# Patient Record
Sex: Female | Born: 1935 | Race: Black or African American | Hispanic: No | Marital: Married | Smoking: Never smoker
Health system: Southern US, Community
[De-identification: ages and names within clinical notes are randomized; demographics above are authoritative.]

## PROBLEM LIST (undated history)

## (undated) DIAGNOSIS — I10 Essential (primary) hypertension: Secondary | ICD-10-CM

## (undated) DIAGNOSIS — E785 Hyperlipidemia, unspecified: Secondary | ICD-10-CM

## (undated) DIAGNOSIS — F419 Anxiety disorder, unspecified: Secondary | ICD-10-CM

## (undated) DIAGNOSIS — A0472 Enterocolitis due to Clostridium difficile, not specified as recurrent: Principal | ICD-10-CM

## (undated) DIAGNOSIS — T827XXA Infection and inflammatory reaction due to other cardiac and vascular devices, implants and grafts, initial encounter: Secondary | ICD-10-CM

## (undated) DIAGNOSIS — N390 Urinary tract infection, site not specified: Secondary | ICD-10-CM

## (undated) DIAGNOSIS — I739 Peripheral vascular disease, unspecified: Secondary | ICD-10-CM

## (undated) DIAGNOSIS — K219 Gastro-esophageal reflux disease without esophagitis: Secondary | ICD-10-CM

## (undated) DIAGNOSIS — M1990 Unspecified osteoarthritis, unspecified site: Secondary | ICD-10-CM

## (undated) DIAGNOSIS — R05 Cough: Secondary | ICD-10-CM

## (undated) DIAGNOSIS — J40 Bronchitis, not specified as acute or chronic: Secondary | ICD-10-CM

## (undated) DIAGNOSIS — F0390 Unspecified dementia without behavioral disturbance: Secondary | ICD-10-CM

## (undated) DIAGNOSIS — F329 Major depressive disorder, single episode, unspecified: Secondary | ICD-10-CM

## (undated) DIAGNOSIS — F32A Depression, unspecified: Secondary | ICD-10-CM

## (undated) DIAGNOSIS — D696 Thrombocytopenia, unspecified: Secondary | ICD-10-CM

## (undated) DIAGNOSIS — R059 Cough, unspecified: Secondary | ICD-10-CM

## (undated) DIAGNOSIS — N186 End stage renal disease: Secondary | ICD-10-CM

## (undated) DIAGNOSIS — A4101 Sepsis due to Methicillin susceptible Staphylococcus aureus: Secondary | ICD-10-CM

## (undated) DIAGNOSIS — S5012XA Contusion of left forearm, initial encounter: Secondary | ICD-10-CM

## (undated) DIAGNOSIS — Z992 Dependence on renal dialysis: Secondary | ICD-10-CM

## (undated) HISTORY — DX: Major depressive disorder, single episode, unspecified: F32.9

## (undated) HISTORY — DX: Unspecified osteoarthritis, unspecified site: M19.90

## (undated) HISTORY — DX: Sepsis due to methicillin susceptible Staphylococcus aureus: A41.01

## (undated) HISTORY — DX: Cough: R05

## (undated) HISTORY — DX: Thrombocytopenia, unspecified: D69.6

## (undated) HISTORY — DX: Gastro-esophageal reflux disease without esophagitis: K21.9

## (undated) HISTORY — PX: ABDOMINAL HYSTERECTOMY: SHX81

## (undated) HISTORY — DX: Cough, unspecified: R05.9

## (undated) HISTORY — PX: EYE SURGERY: SHX253

## (undated) HISTORY — DX: Enterocolitis due to Clostridium difficile, not specified as recurrent: A04.72

## (undated) HISTORY — DX: Depression, unspecified: F32.A

## (undated) HISTORY — DX: Contusion of left forearm, initial encounter: S50.12XA

## (undated) HISTORY — DX: Bronchitis, not specified as acute or chronic: J40

## (undated) HISTORY — DX: Infection and inflammatory reaction due to other cardiac and vascular devices, implants and grafts, initial encounter: T82.7XXA

## (undated) HISTORY — DX: Essential (primary) hypertension: I10

## (undated) HISTORY — PX: THROMBECTOMY AND REVISION OF ARTERIOVENTOUS (AV) GORETEX  GRAFT: SHX6120

---

## 1998-05-15 ENCOUNTER — Encounter: Payer: Self-pay | Admitting: Emergency Medicine

## 1998-05-15 ENCOUNTER — Emergency Department (HOSPITAL_COMMUNITY): Admission: EM | Admit: 1998-05-15 | Discharge: 1998-05-15 | Payer: Self-pay | Admitting: Emergency Medicine

## 2001-03-01 HISTORY — PX: VITRECTOMY: SHX106

## 2001-03-07 ENCOUNTER — Encounter: Payer: Self-pay | Admitting: Ophthalmology

## 2001-03-07 ENCOUNTER — Ambulatory Visit (HOSPITAL_COMMUNITY): Admission: RE | Admit: 2001-03-07 | Discharge: 2001-03-08 | Payer: Self-pay | Admitting: Ophthalmology

## 2001-03-21 ENCOUNTER — Ambulatory Visit (HOSPITAL_COMMUNITY): Admission: RE | Admit: 2001-03-21 | Discharge: 2001-03-21 | Payer: Self-pay | Admitting: Internal Medicine

## 2001-03-21 ENCOUNTER — Encounter: Payer: Self-pay | Admitting: Internal Medicine

## 2003-07-26 ENCOUNTER — Inpatient Hospital Stay (HOSPITAL_COMMUNITY): Admission: EM | Admit: 2003-07-26 | Discharge: 2003-08-06 | Payer: Self-pay | Admitting: Emergency Medicine

## 2003-07-31 ENCOUNTER — Encounter (INDEPENDENT_AMBULATORY_CARE_PROVIDER_SITE_OTHER): Payer: Self-pay | Admitting: Specialist

## 2003-08-01 ENCOUNTER — Encounter (INDEPENDENT_AMBULATORY_CARE_PROVIDER_SITE_OTHER): Payer: Self-pay | Admitting: Specialist

## 2003-08-01 ENCOUNTER — Encounter: Payer: Self-pay | Admitting: Gastroenterology

## 2003-08-10 ENCOUNTER — Ambulatory Visit (HOSPITAL_COMMUNITY): Admission: RE | Admit: 2003-08-10 | Discharge: 2003-08-10 | Payer: Self-pay | Admitting: Gastroenterology

## 2004-01-31 ENCOUNTER — Encounter: Admission: RE | Admit: 2004-01-31 | Discharge: 2004-01-31 | Payer: Self-pay | Admitting: Internal Medicine

## 2004-04-03 ENCOUNTER — Ambulatory Visit: Payer: Self-pay | Admitting: Internal Medicine

## 2004-06-16 ENCOUNTER — Ambulatory Visit: Payer: Self-pay | Admitting: Internal Medicine

## 2004-06-24 ENCOUNTER — Ambulatory Visit: Payer: Self-pay | Admitting: Internal Medicine

## 2004-07-01 ENCOUNTER — Ambulatory Visit: Payer: Self-pay | Admitting: Internal Medicine

## 2004-07-25 ENCOUNTER — Ambulatory Visit: Payer: Self-pay | Admitting: Internal Medicine

## 2004-07-30 HISTORY — PX: ARTERIOVENOUS GRAFT PLACEMENT: SUR1029

## 2004-08-05 ENCOUNTER — Ambulatory Visit: Payer: Self-pay | Admitting: Internal Medicine

## 2004-08-18 ENCOUNTER — Encounter (HOSPITAL_COMMUNITY): Admission: RE | Admit: 2004-08-18 | Discharge: 2004-08-20 | Payer: Self-pay | Admitting: Nephrology

## 2004-08-22 ENCOUNTER — Inpatient Hospital Stay (HOSPITAL_COMMUNITY): Admission: AD | Admit: 2004-08-22 | Discharge: 2004-08-24 | Payer: Self-pay | Admitting: Vascular Surgery

## 2004-10-03 ENCOUNTER — Encounter: Admission: RE | Admit: 2004-10-03 | Discharge: 2004-10-13 | Payer: Self-pay | Admitting: Nephrology

## 2005-01-06 ENCOUNTER — Ambulatory Visit (HOSPITAL_COMMUNITY): Admission: RE | Admit: 2005-01-06 | Discharge: 2005-01-06 | Payer: Self-pay | Admitting: Vascular Surgery

## 2005-07-06 ENCOUNTER — Encounter: Admission: RE | Admit: 2005-07-06 | Discharge: 2005-07-06 | Payer: Self-pay | Admitting: Nephrology

## 2005-07-10 ENCOUNTER — Emergency Department (HOSPITAL_COMMUNITY): Admission: EM | Admit: 2005-07-10 | Discharge: 2005-07-10 | Payer: Self-pay | Admitting: Emergency Medicine

## 2006-02-06 ENCOUNTER — Inpatient Hospital Stay (HOSPITAL_COMMUNITY): Admission: EM | Admit: 2006-02-06 | Discharge: 2006-02-10 | Payer: Self-pay | Admitting: Emergency Medicine

## 2006-02-13 ENCOUNTER — Ambulatory Visit (HOSPITAL_COMMUNITY): Admission: RE | Admit: 2006-02-13 | Discharge: 2006-02-13 | Payer: Self-pay | Admitting: Vascular Surgery

## 2006-03-01 HISTORY — PX: ARTERIOVENOUS GRAFT PLACEMENT: SUR1029

## 2006-03-02 ENCOUNTER — Ambulatory Visit (HOSPITAL_COMMUNITY): Admission: RE | Admit: 2006-03-02 | Discharge: 2006-03-02 | Payer: Self-pay | Admitting: Vascular Surgery

## 2006-03-12 ENCOUNTER — Ambulatory Visit (HOSPITAL_COMMUNITY): Admission: RE | Admit: 2006-03-12 | Discharge: 2006-03-12 | Payer: Self-pay | Admitting: Vascular Surgery

## 2006-04-16 ENCOUNTER — Ambulatory Visit (HOSPITAL_COMMUNITY): Admission: RE | Admit: 2006-04-16 | Discharge: 2006-04-16 | Payer: Self-pay | Admitting: Vascular Surgery

## 2006-04-20 ENCOUNTER — Ambulatory Visit: Payer: Self-pay | Admitting: Internal Medicine

## 2006-04-21 LAB — CONVERTED CEMR LAB
ALT: 14 units/L (ref 0–40)
AST: 18 units/L (ref 0–37)
Albumin: 3.6 g/dL (ref 3.5–5.2)
BUN: 18 mg/dL (ref 6–23)
Creatinine, Ser: 4.4 mg/dL — ABNORMAL HIGH (ref 0.4–1.2)
GFR calc non Af Amer: 10 mL/min
Glomerular Filtration Rate, Af Am: 13 mL/min/{1.73_m2}
Potassium: 3.9 meq/L (ref 3.5–5.1)
Sodium: 140 meq/L (ref 135–145)
Total Bilirubin: 0.8 mg/dL (ref 0.3–1.2)

## 2006-05-17 ENCOUNTER — Ambulatory Visit (HOSPITAL_COMMUNITY): Admission: RE | Admit: 2006-05-17 | Discharge: 2006-05-17 | Payer: Self-pay | Admitting: Nephrology

## 2006-05-28 ENCOUNTER — Ambulatory Visit: Payer: Self-pay | Admitting: Internal Medicine

## 2006-05-28 LAB — CONVERTED CEMR LAB
GFR calc non Af Amer: 7 mL/min
Glomerular Filtration Rate, Af Am: 8 mL/min/{1.73_m2}
Glucose, Bld: 246 mg/dL — ABNORMAL HIGH (ref 70–99)
Hgb A1c MFr Bld: 6.7 % — ABNORMAL HIGH (ref 4.6–6.0)
Potassium: 4 meq/L (ref 3.5–5.1)
Sodium: 134 meq/L — ABNORMAL LOW (ref 135–145)

## 2006-07-26 ENCOUNTER — Ambulatory Visit: Payer: Self-pay | Admitting: Internal Medicine

## 2006-10-08 ENCOUNTER — Ambulatory Visit: Payer: Self-pay | Admitting: Internal Medicine

## 2006-10-14 ENCOUNTER — Encounter: Admission: RE | Admit: 2006-10-14 | Discharge: 2006-10-14 | Payer: Self-pay | Admitting: Nephrology

## 2006-12-08 ENCOUNTER — Ambulatory Visit: Payer: Self-pay | Admitting: Internal Medicine

## 2007-01-12 DIAGNOSIS — I10 Essential (primary) hypertension: Secondary | ICD-10-CM

## 2007-01-12 DIAGNOSIS — K219 Gastro-esophageal reflux disease without esophagitis: Secondary | ICD-10-CM

## 2007-01-12 DIAGNOSIS — E1121 Type 2 diabetes mellitus with diabetic nephropathy: Secondary | ICD-10-CM

## 2007-02-25 ENCOUNTER — Ambulatory Visit: Payer: Self-pay | Admitting: Internal Medicine

## 2007-02-25 DIAGNOSIS — M1990 Unspecified osteoarthritis, unspecified site: Secondary | ICD-10-CM | POA: Insufficient documentation

## 2007-06-08 ENCOUNTER — Ambulatory Visit: Payer: Self-pay | Admitting: Internal Medicine

## 2007-06-08 DIAGNOSIS — N186 End stage renal disease: Secondary | ICD-10-CM

## 2007-06-08 DIAGNOSIS — Z992 Dependence on renal dialysis: Secondary | ICD-10-CM | POA: Insufficient documentation

## 2007-06-08 LAB — CONVERTED CEMR LAB
CO2: 31 meq/L (ref 19–32)
Chloride: 99 meq/L (ref 96–112)
Creatinine, Ser: 6.4 mg/dL (ref 0.4–1.2)
Glucose, Bld: 130 mg/dL — ABNORMAL HIGH (ref 70–99)
Hgb A1c MFr Bld: 6.1 % — ABNORMAL HIGH (ref 4.6–6.0)
Potassium: 4.9 meq/L (ref 3.5–5.1)
Sodium: 142 meq/L (ref 135–145)

## 2007-06-29 ENCOUNTER — Encounter: Admission: RE | Admit: 2007-06-29 | Discharge: 2007-08-29 | Payer: Self-pay | Admitting: Orthopaedic Surgery

## 2007-09-07 ENCOUNTER — Ambulatory Visit: Payer: Self-pay | Admitting: Internal Medicine

## 2007-09-23 ENCOUNTER — Encounter: Payer: Self-pay | Admitting: Internal Medicine

## 2007-12-19 ENCOUNTER — Ambulatory Visit: Payer: Self-pay | Admitting: Internal Medicine

## 2007-12-19 LAB — CONVERTED CEMR LAB
BUN: 61 mg/dL — ABNORMAL HIGH (ref 6–23)
CO2: 26 meq/L (ref 19–32)
Chloride: 98 meq/L (ref 96–112)
GFR calc non Af Amer: 5 mL/min
Potassium: 4.4 meq/L (ref 3.5–5.1)

## 2008-03-14 ENCOUNTER — Ambulatory Visit: Payer: Self-pay | Admitting: Internal Medicine

## 2008-06-08 ENCOUNTER — Ambulatory Visit: Payer: Self-pay | Admitting: Internal Medicine

## 2008-07-11 ENCOUNTER — Encounter: Payer: Self-pay | Admitting: Internal Medicine

## 2008-07-12 ENCOUNTER — Encounter: Payer: Self-pay | Admitting: Gastroenterology

## 2008-07-17 ENCOUNTER — Encounter (INDEPENDENT_AMBULATORY_CARE_PROVIDER_SITE_OTHER): Payer: Self-pay | Admitting: *Deleted

## 2008-09-07 ENCOUNTER — Ambulatory Visit: Payer: Self-pay | Admitting: Internal Medicine

## 2009-01-11 ENCOUNTER — Ambulatory Visit: Payer: Self-pay | Admitting: Internal Medicine

## 2009-01-11 DIAGNOSIS — E785 Hyperlipidemia, unspecified: Secondary | ICD-10-CM

## 2009-01-11 LAB — CONVERTED CEMR LAB
Basophils Relative: 0.1 % (ref 0.0–3.0)
CO2: 26 meq/L (ref 19–32)
Calcium: 10 mg/dL (ref 8.4–10.5)
Cholesterol: 165 mg/dL (ref 0–200)
GFR calc non Af Amer: 8.39 mL/min (ref >60)
HCT: 38.5 % (ref 36.0–46.0)
Hemoglobin: 12.8 g/dL (ref 12.0–15.0)
Hgb A1c MFr Bld: 6 % (ref 4.6–6.5)
Lymphocytes Relative: 32.7 % (ref 12.0–46.0)
MCHC: 33.3 g/dL (ref 30.0–36.0)
Monocytes Relative: 8.2 % (ref 3.0–12.0)
Neutro Abs: 2.9 10*3/uL (ref 1.4–7.7)
Potassium: 4.5 meq/L (ref 3.5–5.1)
RBC: 3.98 M/uL (ref 3.87–5.11)
Sodium: 140 meq/L (ref 135–145)
TSH: 2.13 microintl units/mL (ref 0.35–5.50)

## 2009-02-07 LAB — CONVERTED CEMR LAB
BUN: 47 mg/dL
Chloride: 104 meq/L
Creatinine, Ser: 8.5 mg/dL
Glucose: 111 mg/dL
HCT: 39.1 %
Hemoglobin: 12.4 g/dL
Potassium: 4 meq/L
Sodium: 139 meq/L

## 2009-02-24 ENCOUNTER — Encounter: Payer: Self-pay | Admitting: Internal Medicine

## 2009-03-08 ENCOUNTER — Ambulatory Visit: Payer: Self-pay | Admitting: Internal Medicine

## 2009-03-08 DIAGNOSIS — F0390 Unspecified dementia without behavioral disturbance: Secondary | ICD-10-CM | POA: Insufficient documentation

## 2009-03-12 ENCOUNTER — Ambulatory Visit (HOSPITAL_COMMUNITY): Admission: RE | Admit: 2009-03-12 | Discharge: 2009-03-12 | Payer: Self-pay | Admitting: Surgery

## 2009-03-12 ENCOUNTER — Ambulatory Visit: Payer: Self-pay | Admitting: Vascular Surgery

## 2009-03-18 ENCOUNTER — Ambulatory Visit (HOSPITAL_COMMUNITY): Admission: RE | Admit: 2009-03-18 | Discharge: 2009-03-18 | Payer: Self-pay | Admitting: Vascular Surgery

## 2009-04-08 ENCOUNTER — Encounter: Admission: RE | Admit: 2009-04-08 | Discharge: 2009-04-08 | Payer: Self-pay | Admitting: Surgery

## 2009-04-08 ENCOUNTER — Ambulatory Visit: Payer: Self-pay | Admitting: Vascular Surgery

## 2009-04-23 ENCOUNTER — Telehealth: Payer: Self-pay | Admitting: Internal Medicine

## 2009-05-07 ENCOUNTER — Ambulatory Visit: Payer: Self-pay | Admitting: Surgery

## 2009-05-07 ENCOUNTER — Ambulatory Visit (HOSPITAL_COMMUNITY): Admission: RE | Admit: 2009-05-07 | Discharge: 2009-05-07 | Payer: Self-pay | Admitting: Vascular Surgery

## 2009-05-09 ENCOUNTER — Ambulatory Visit (HOSPITAL_COMMUNITY): Admission: RE | Admit: 2009-05-09 | Discharge: 2009-05-09 | Payer: Self-pay | Admitting: Vascular Surgery

## 2009-06-10 ENCOUNTER — Ambulatory Visit: Payer: Self-pay | Admitting: Internal Medicine

## 2009-06-28 ENCOUNTER — Encounter: Payer: Self-pay | Admitting: Internal Medicine

## 2009-07-02 ENCOUNTER — Encounter: Payer: Self-pay | Admitting: Internal Medicine

## 2009-07-04 ENCOUNTER — Ambulatory Visit: Payer: Self-pay | Admitting: Vascular Surgery

## 2009-07-26 ENCOUNTER — Ambulatory Visit: Payer: Self-pay | Admitting: Vascular Surgery

## 2009-08-05 ENCOUNTER — Ambulatory Visit: Payer: Self-pay | Admitting: Vascular Surgery

## 2009-08-14 ENCOUNTER — Encounter: Payer: Self-pay | Admitting: Internal Medicine

## 2009-08-28 ENCOUNTER — Encounter: Payer: Self-pay | Admitting: Internal Medicine

## 2009-09-09 ENCOUNTER — Ambulatory Visit: Payer: Self-pay | Admitting: Internal Medicine

## 2009-09-09 LAB — CONVERTED CEMR LAB
HDL goal, serum: 40 mg/dL
LDL Goal: 100 mg/dL

## 2009-09-18 ENCOUNTER — Ambulatory Visit: Payer: Self-pay | Admitting: Vascular Surgery

## 2009-09-18 ENCOUNTER — Ambulatory Visit (HOSPITAL_COMMUNITY): Admission: RE | Admit: 2009-09-18 | Discharge: 2009-09-18 | Payer: Self-pay | Admitting: Vascular Surgery

## 2009-09-27 ENCOUNTER — Ambulatory Visit: Payer: Self-pay | Admitting: Vascular Surgery

## 2009-10-02 ENCOUNTER — Telehealth: Payer: Self-pay | Admitting: Internal Medicine

## 2009-10-03 ENCOUNTER — Ambulatory Visit: Payer: Self-pay | Admitting: Internal Medicine

## 2009-10-04 ENCOUNTER — Ambulatory Visit: Payer: Self-pay | Admitting: Vascular Surgery

## 2009-10-05 ENCOUNTER — Inpatient Hospital Stay (HOSPITAL_COMMUNITY): Admission: EM | Admit: 2009-10-05 | Discharge: 2009-10-07 | Payer: Self-pay | Admitting: Emergency Medicine

## 2009-10-07 ENCOUNTER — Telehealth: Payer: Self-pay | Admitting: Internal Medicine

## 2009-10-08 ENCOUNTER — Encounter: Payer: Self-pay | Admitting: Internal Medicine

## 2009-10-15 ENCOUNTER — Encounter: Payer: Self-pay | Admitting: Internal Medicine

## 2009-10-18 ENCOUNTER — Encounter: Payer: Self-pay | Admitting: Internal Medicine

## 2009-10-23 ENCOUNTER — Telehealth (INDEPENDENT_AMBULATORY_CARE_PROVIDER_SITE_OTHER): Payer: Self-pay

## 2009-11-13 ENCOUNTER — Ambulatory Visit: Payer: Self-pay | Admitting: Internal Medicine

## 2009-11-13 DIAGNOSIS — I951 Orthostatic hypotension: Secondary | ICD-10-CM | POA: Insufficient documentation

## 2009-11-13 LAB — CONVERTED CEMR LAB
Basophils Relative: 0.7 % (ref 0.0–3.0)
Chloride: 100 meq/L (ref 96–112)
Eosinophils Relative: 3.1 % (ref 0.0–5.0)
Folate: >20 ng/mL
HCT: 37.3 % (ref 36.0–46.0)
Lymphs Abs: 1.4 10*3/uL (ref 0.7–4.0)
MCV: 91.1 fL (ref 78.0–100.0)
Monocytes Absolute: 0.5 10*3/uL (ref 0.1–1.0)
Neutrophils Relative %: 58.7 % (ref 43.0–77.0)
Potassium: 3.9 meq/L (ref 3.5–5.1)
RBC: 4.09 M/uL (ref 3.87–5.11)
Sodium: 139 meq/L (ref 135–145)
TSH: 1.43 microintl units/mL (ref 0.35–5.50)
Transferrin: 161.9 mg/dL — ABNORMAL LOW (ref 212.0–360.0)
WBC: 5.1 10*3/uL (ref 4.5–10.5)

## 2009-11-15 ENCOUNTER — Encounter: Payer: Self-pay | Admitting: Internal Medicine

## 2010-01-06 ENCOUNTER — Telehealth: Payer: Self-pay | Admitting: Internal Medicine

## 2010-01-15 ENCOUNTER — Ambulatory Visit: Payer: Self-pay | Admitting: Internal Medicine

## 2010-04-07 ENCOUNTER — Ambulatory Visit: Payer: Self-pay | Admitting: Vascular Surgery

## 2010-04-16 ENCOUNTER — Ambulatory Visit: Payer: Self-pay | Admitting: Internal Medicine

## 2010-04-16 DIAGNOSIS — H547 Unspecified visual loss: Secondary | ICD-10-CM

## 2010-04-16 LAB — CONVERTED CEMR LAB
Calcium: 9.5 mg/dL (ref 8.4–10.5)
Chloride: 97 meq/L (ref 96–112)
Creatinine, Ser: 6.8 mg/dL (ref 0.4–1.2)
GFR calc non Af Amer: 7.46 mL/min (ref >60)
Hgb A1c MFr Bld: 6.2 % (ref 4.6–6.5)

## 2010-04-17 ENCOUNTER — Telehealth: Payer: Self-pay | Admitting: Internal Medicine

## 2010-06-01 ENCOUNTER — Emergency Department (HOSPITAL_COMMUNITY)
Admission: EM | Admit: 2010-06-01 | Discharge: 2010-06-01 | Payer: Self-pay | Source: Home / Self Care | Admitting: Family Medicine

## 2010-06-08 ENCOUNTER — Emergency Department (HOSPITAL_COMMUNITY)
Admission: EM | Admit: 2010-06-08 | Discharge: 2010-06-08 | Payer: Self-pay | Source: Home / Self Care | Admitting: Family Medicine

## 2010-06-22 ENCOUNTER — Encounter: Payer: Self-pay | Admitting: Surgery

## 2010-06-24 ENCOUNTER — Telehealth: Payer: Self-pay | Admitting: Internal Medicine

## 2010-07-01 ENCOUNTER — Ambulatory Visit
Admission: RE | Admit: 2010-07-01 | Discharge: 2010-07-01 | Payer: Self-pay | Source: Home / Self Care | Attending: Internal Medicine | Admitting: Internal Medicine

## 2010-07-01 DIAGNOSIS — D500 Iron deficiency anemia secondary to blood loss (chronic): Secondary | ICD-10-CM | POA: Insufficient documentation

## 2010-07-01 DIAGNOSIS — D509 Iron deficiency anemia, unspecified: Secondary | ICD-10-CM | POA: Insufficient documentation

## 2010-07-03 NOTE — Assessment & Plan Note (Signed)
Summary: 3 MNTH ROV//SLM   Vital Signs:  Patient profile:   77 year old female Height:      64 inches Weight:      166 pounds BMI:     28.60 Temp:     98.1 degrees F oral Pulse rate:   72 / minute Resp:     14 per minute BP sitting:   134 / 72  (left arm)  Vitals Entered By: Willy Eddy, LPN (June 10, 2009 12:32 PM) CC: roa, Hypertension Management   CC:  roa and Hypertension Management.  History of Present Illness: Has been experiencing increased heartburn she is doing dialysis through a [facility] they have not been able to use another site her sugars are good and her blood ahs been good per the reports from dialysis the CBG are in the 90-110 range her neuropathy is stable  Hypertension History:      She denies headache, chest pain, palpitations, dyspnea with exertion, orthopnea, PND, peripheral edema, visual symptoms, neurologic problems, syncope, and side effects from treatment.        Positive major cardiovascular risk factors include female age 69 years old or older, diabetes, hyperlipidemia, and hypertension.  Negative major cardiovascular risk factors include non-tobacco-user status.        Positive history for target organ damage include renal insufficiency.     Preventive Screening-Counseling & Management  Alcohol-Tobacco     Smoking Status: never  Problems Prior to Update: 1)  Memory Loss  (ICD-780.93) 2)  Hyperlipidemia, Mild  (ICD-272.4) 3)  End Stage Renal Disease  (ICD-585.6) 4)  Osteoarthritis  (ICD-715.90) 5)  Hypertension  (ICD-401.9) 6)  Gerd  (ICD-530.81) 7)  Diabetes Mellitus, Type II  (ICD-250.00)  Medications Prior to Update: 1)  Dialyvite 3000 3 Mg  Tabs (B Complex-C-Biotin-E-Min-Fa) .... Once Daily 2)  Renagel 800 Mg  Tabs (Sevelamer Hcl) .... Once Daily 3)  Reglan 5 Mg  Tabs (Metoclopramide Hcl) .... One By Mouth Q Ac 4)  Actos 45 Mg  Tabs (Pioglitazone Hcl) .... Once Daily 5)  Simvastatin 80 Mg  Tabs (Simvastatin) .... Once  Daily 6)  Protonix 40 Mg  Pack (Pantoprazole Sodium) .... Once Daily 7)  Lantus 100 Unit/ml  Soln (Insulin Glargine) .... 9 Units At Bedtime 8)  Novolog 100 Unit/ml  Soln (Insulin Aspart) .... As Needed If Bs Is Greater Than 1450 9)  Cosopt 2-0.5 %  Soln (Dorzolamide-Timolol) .... Once Daily 10)  Qualaquin 324 Mg  Caps (Quinine Sulfate) .... As Needed Leg Cramps 11)  Tylenol Extra Strength 1000 Mg/52ml  Liqd (Acetaminophen) .... As Needed 12)  Ultram 50 Mg  Tabs (Tramadol Hcl) .... One By Mouth Three Times A Day As Needed For Pain 13)  Xyzal 5 Mg  Tabs (Levocetirizine Dihydrochloride) .... 1/2 Once Daily 14)  Lexapro 10 Mg Tabs (Escitalopram Oxalate) .... 1/2 By Mouth Daily  Current Medications (verified): 1)  Dialyvite 3000 3 Mg  Tabs (B Complex-C-Biotin-E-Min-Fa) .... Once Daily 2)  Renagel 800 Mg  Tabs (Sevelamer Hcl) .... Once Daily 3)  Reglan 5 Mg  Tabs (Metoclopramide Hcl) .... One By Mouth Q Ac 4)  Actos 45 Mg  Tabs (Pioglitazone Hcl) .... Once Daily 5)  Simvastatin 80 Mg  Tabs (Simvastatin) .... Once Daily 6)  Dexilant 60 Mg Cpdr (Dexlansoprazole) .... One By Mouth Dialy 7)  Lantus 100 Unit/ml  Soln (Insulin Glargine) .... 9 Units At Bedtime 8)  Novolog 100 Unit/ml  Soln (Insulin Aspart) .... As Needed If Bs  Is Greater Than 1450 9)  Cosopt 2-0.5 %  Soln (Dorzolamide-Timolol) .... Once Daily 10)  Qualaquin 324 Mg  Caps (Quinine Sulfate) .... As Needed Leg Cramps 11)  Tylenol Extra Strength 1000 Mg/22ml  Liqd (Acetaminophen) .... As Needed 12)  Ultram 50 Mg  Tabs (Tramadol Hcl) .... One By Mouth Three Times A Day As Needed For Pain 13)  Xyzal 5 Mg  Tabs (Levocetirizine Dihydrochloride) .... 1/2 Once Daily 14)  Lexapro 10 Mg Tabs (Escitalopram Oxalate) .... 1/2 By Mouth Daily  Allergies (verified): No Known Drug Allergies  Past History:  Social History: Last updated: 01/12/2007 Retired Married Never Smoked Alcohol use-no Drug use-no  Risk Factors: Exercise: yes  (02/25/2007)  Risk Factors: Smoking Status: never (06/10/2009)  Past medical, surgical, family and social histories (including risk factors) reviewed, and no changes noted (except as noted below).  Past Medical History: Reviewed history from 03/08/2009 and no changes required. Cough Diabetes mellitus, type II GERD Hypertension Bronchitis Osteoarthritis end stage renal dz  Past Surgical History: Reviewed history from 02/25/2007 and no changes required. Cataract extraction Colonoscopy dialysis access  Family History: Reviewed history and no changes required.  Social History: Reviewed history from 01/12/2007 and no changes required. Retired Married Never Smoked Alcohol use-no Drug use-no  Review of Systems  The patient denies anorexia, fever, weight loss, weight gain, vision loss, decreased hearing, hoarseness, chest pain, syncope, dyspnea on exertion, peripheral edema, prolonged cough, headaches, hemoptysis, abdominal pain, melena, hematochezia, severe indigestion/heartburn, hematuria, incontinence, genital sores, muscle weakness, suspicious skin lesions, transient blindness, difficulty walking, depression, unusual weight change, abnormal bleeding, enlarged lymph nodes, angioedema, and breast masses.    Physical Exam  General:  Well-developed,well-nourished,in no acute distress; alert,appropriate and cooperative throughout examination Head:  Normocephalic and atraumatic without obvious abnormalities. No apparent alopecia or balding. Eyes:  pupils equal and pupils round.   Ears:  R ear normal and L ear normal.   Nose:  no external deformity and no nasal discharge.   Neck:  No deformities, masses, or tenderness noted. Lungs:  normal respiratory effort and no intercostal retractions.   Heart:  normal rate and regular rhythm.   Abdomen:  soft, non-tender, and normal bowel sounds.   Msk:  normal ROM and no joint tenderness.   Extremities:  trace left pedal edema and trace  right pedal edema.   Neurologic:  alert & oriented X3 and finger-to-nose normal.     Impression & Recommendations:  Problem # 1:  HYPERLIPIDEMIA, MILD (ICD-272.4)  Her updated medication list for this problem includes:    Simvastatin 80 Mg Tabs (Simvastatin) ..... Once daily  Labs Reviewed: SGOT: 18 (04/21/2006)   SGPT: 14 (04/21/2006)  Prior 10 Yr Risk Heart Disease: Not enough information (09/07/2008)   HDL:45.30 (01/11/2009)  Chol:165 (01/11/2009)  Problem # 2:  HYPERTENSION (ICD-401.9)  BP today: 134/72 Prior BP: 134/66 (03/08/2009)  Prior 10 Yr Risk Heart Disease: Not enough information (09/07/2008)  Labs Reviewed: K+: 4.0 (02/07/2009) Creat: : 8.5 (02/07/2009)   Chol: 165 (01/11/2009)   HDL: 45.30 (01/11/2009)     Problem # 3:  GERD (ICD-530.81)  Her updated medication list for this problem includes:    Dexilant 60 Mg Cpdr (Dexlansoprazole) ..... One by mouth dialy  Labs Reviewed: Hgb: 12.4 (02/07/2009)   Hct: 39.1 (02/07/2009)  Complete Medication List: 1)  Dialyvite 3000 3 Mg Tabs (B complex-c-biotin-e-min-fa) .... Once daily 2)  Renagel 800 Mg Tabs (Sevelamer hcl) .... Once daily 3)  Reglan 5 Mg Tabs (Metoclopramide hcl) .Marland KitchenMarland KitchenMarland Kitchen  One by mouth q ac 4)  Actos 45 Mg Tabs (Pioglitazone hcl) .... Once daily 5)  Simvastatin 80 Mg Tabs (Simvastatin) .... Once daily 6)  Dexilant 60 Mg Cpdr (Dexlansoprazole) .... One by mouth dialy 7)  Lantus 100 Unit/ml Soln (Insulin glargine) .... 9 units at bedtime 8)  Novolog 100 Unit/ml Soln (Insulin aspart) .... As needed if bs is greater than 1450 9)  Cosopt 2-0.5 % Soln (Dorzolamide-timolol) .... Once daily 10)  Qualaquin 324 Mg Caps (Quinine sulfate) .... As needed leg cramps 11)  Tylenol Extra Strength 1000 Mg/58ml Liqd (Acetaminophen) .... As needed 12)  Ultram 50 Mg Tabs (Tramadol hcl) .... One by mouth three times a day as needed for pain 13)  Xyzal 5 Mg Tabs (Levocetirizine dihydrochloride) .... 1/2 once daily 14)  Lexapro  10 Mg Tabs (Escitalopram oxalate) .... 1/2 by mouth daily  Hypertension Assessment/Plan:      The patient's hypertensive risk group is category C: Target organ damage and/or diabetes.  Today's blood pressure is 134/72.  Her blood pressure goal is < 130/80.  Patient Instructions: 1)  Please schedule a follow-up appointment in 3 months. Prescriptions: DEXILANT 60 MG CPDR (DEXLANSOPRAZOLE) one by mouth dialy  #30 x 11   Entered and Authorized by:   Stacie Glaze MD   Signed by:   Stacie Glaze MD on 06/10/2009   Method used:   Electronically to        [id number] [address]* (retail)       [address], [address]       [place], [place]  [address]       Ph: [phone]       Fax: [phone]   RxID:   [id number]

## 2010-07-03 NOTE — Progress Notes (Signed)
Summary: Call-A-Nurse Report  Phone Note Call from Patient   Reason for Call: Acute Illness Summary of Call: pt was admitted as seen in e-chart Initial call taken by: Willy Eddy, LPN,  Oct 08, 5619 8:58 AM      Call-A-Nurse Triage Call Report Triage Record Num: [id number] Operator: Jeraldine Loots Patient Name: Avital Dancy Call Date & Time: 10/05/2009 12:39:46PM Patient Phone: [phone] PCP: Patient Gender: Female PCP Fax : Patient DOB: 10/30/1928 [facility] Name: [facility] Reason for Call: Daughter calling, they are on their way to the ED. Not feeling well at all, dialysis pt. Protocol(s) Used: Office Note Recommended Outcome per Protocol: Information Noted and Sent to Office Reason for Outcome: Caller information to office Care Advice:  ~ 10/05/2009 12:42:56PM Page 1 of 1 [email or web address]

## 2010-07-03 NOTE — Progress Notes (Signed)
  Phone Note Call from Patient   Caller: Patient and daughter Call For: Stacie Glaze MD Summary of Call: Pt and daughter both call stating that pt is not well, has been going to different MDs for a year, and cannot explain any symptoms, but want to talk to Dr. Lovell Sheehan.  Daughter mentions mother wants to go to the  hospital, but Mom denies this. Initial call taken by: Lynann Beaver CMA AAMA,  June 24, 2010 2:01 PM  Follow-up for Phone Call        talked with pt and she is not feeling well-0 ov given for 1-31,meanwhile talk with dialysis center about fatigue Follow-up by: Willy Eddy, LPN,  June 24, 2010 2:27 PM

## 2010-07-03 NOTE — Assessment & Plan Note (Signed)
Summary: 3 month rov/njr   Vital Signs:  Patient profile:   77 year old female Height:      64 inches Temp:     98.1 degrees F oral Pulse rate:   76 / minute Resp:     16 per minute BP sitting:   104 / 60  (left arm)  Vitals Entered By: Willy Eddy, LPN (April 16, 2010 10:17 AM) CC: roa, Hypertension Management Is Patient Diabetic? Yes Did you bring your meter with you today? No   Primary Care [facility] Plancarte:  Stacie Glaze MD  CC:  roa and Hypertension Management.  History of Present Illness: Has severe neuropathy... and has gate problems Has not fallen recently  but has to use the walker end stage renal disease on hemodialysis occasional post dialysis hypotension mild memory issues  Hypertension History:      She denies headache, chest pain, palpitations, dyspnea with exertion, orthopnea, PND, peripheral edema, visual symptoms, neurologic problems, syncope, and side effects from treatment.        Positive major cardiovascular risk factors include female age 78 years old or older, diabetes, hyperlipidemia, and hypertension.  Negative major cardiovascular risk factors include non-tobacco-user status.        Positive history for target organ damage include renal insufficiency.     Preventive Screening-Counseling & Management  Alcohol-Tobacco     Smoking Status: never     Tobacco Counseling: not indicated; no tobacco use  Problems Prior to Update: 1)  Unspecified Visual Loss  (ICD-369.9) 2)  Hypotension, Orthostatic  (ICD-458.0) 3)  Degenerative Joint Disease, Advanced  (ICD-715.90) 4)  Memory Loss  (ICD-780.93) 5)  Hyperlipidemia, Mild  (ICD-272.4) 6)  End Stage Renal Disease  (ICD-585.6) 7)  Osteoarthritis  (ICD-715.90) 8)  Hypertension  (ICD-401.9) 9)  Gerd  (ICD-530.81) 10)  Diabetes Mellitus, Type II  (ICD-250.00)  Current Problems (verified): 1)  Hypotension, Orthostatic  (ICD-458.0) 2)  Degenerative Joint Disease, Advanced  (ICD-715.90) 3)  Memory Loss   (ICD-780.93) 4)  Hyperlipidemia, Mild  (ICD-272.4) 5)  End Stage Renal Disease  (ICD-585.6) 6)  Osteoarthritis  (ICD-715.90) 7)  Hypertension  (ICD-401.9) 8)  Gerd  (ICD-530.81) 9)  Diabetes Mellitus, Type II  (ICD-250.00)  Medications Prior to Update: 1)  Dialyvite 3000 3 Mg  Tabs (B Complex-C-Biotin-E-Min-Fa) .... Once Daily 2)  Renagel 800 Mg  Tabs (Sevelamer Hcl) .... Once Daily 3)  Reglan 5 Mg  Tabs (Metoclopramide Hcl) .... One By Mouth Q Ac 4)  Actos 45 Mg  Tabs (Pioglitazone Hcl) .... Once Daily 5)  Dexilant 60 Mg Cpdr (Dexlansoprazole) .... One By Mouth Dialy 6)  Lantus 100 Unit/ml  Soln (Insulin Glargine) .... 9 Units At Bedtime 7)  Cosopt 2-0.5 %  Soln (Dorzolamide-Timolol) .... Once Daily 8)  Qualaquin 324 Mg  Caps (Quinine Sulfate) .... As Needed Leg Cramps 9)  Tylenol Extra Strength 1000 Mg/49ml  Liqd (Acetaminophen) .... As Needed 10)  Ultram 50 Mg  Tabs (Tramadol Hcl) .... One By Mouth Three Times A Day As Needed For Pain 11)  Xyzal 5 Mg  Tabs (Levocetirizine Dihydrochloride) .... 1/2 Once Daily 12)  Lexapro 10 Mg Tabs (Escitalopram Oxalate) .... 1/2 By Mouth Daily 13)  Zofran 4 Mg Tabs (Ondansetron Hcl) .... One By Mouth Q 4-6 Hrs As Needed Nausea and Vomiting  Current Medications (verified): 1)  Dialyvite 3000 3 Mg  Tabs (B Complex-C-Biotin-E-Min-Fa) .... Once Daily 2)  Renagel 800 Mg  Tabs (Sevelamer Hcl) .... Once Daily 3)  Reglan 5 Mg  Tabs (Metoclopramide Hcl) .... One By Mouth Q Ac 4)  Actos 45 Mg  Tabs (Pioglitazone Hcl) .... Once Daily 5)  Dexilant 60 Mg Cpdr (Dexlansoprazole) .... One By Mouth Dialy 6)  Lantus 100 Unit/ml  Soln (Insulin Glargine) .... 9 Units At Bedtime 7)  Cosopt 2-0.5 %  Soln (Dorzolamide-Timolol) .... Once Daily 8)  Qualaquin 324 Mg  Caps (Quinine Sulfate) .... As Needed Leg Cramps 9)  Tylenol Extra Strength 1000 Mg/41ml  Liqd (Acetaminophen) .... As Needed 10)  Ultram 50 Mg  Tabs (Tramadol Hcl) .... One By Mouth Three Times A Day As  Needed For Pain 11)  Xyzal 5 Mg  Tabs (Levocetirizine Dihydrochloride) .... 1/2 Once Daily 12)  Lexapro 10 Mg Tabs (Escitalopram Oxalate) .... 1/2 By Mouth Daily 13)  Zofran 4 Mg Tabs (Ondansetron Hcl) .... One By Mouth Q 4-6 Hrs As Needed Nausea and Vomiting  Allergies (verified): No Known Drug Allergies  Past History:  Social History: Last updated: 01/12/2007 Retired Married Never Smoked Alcohol use-no Drug use-no  Risk Factors: Exercise: yes (02/25/2007)  Risk Factors: Smoking Status: never (04/16/2010)  Past medical, surgical, family and social histories (including risk factors) reviewed, and no changes noted (except as noted below).  Past Medical History: Reviewed history from 03/08/2009 and no changes required. Cough Diabetes mellitus, type II GERD Hypertension Bronchitis Osteoarthritis end stage renal dz  Past Surgical History: Reviewed history from 02/25/2007 and no changes required. Cataract extraction Colonoscopy dialysis access  Family History: Reviewed history and no changes required.  Social History: Reviewed history from 01/12/2007 and no changes required. Retired Married Never Smoked Alcohol use-no Drug use-no  Review of Systems  The patient denies anorexia, fever, weight loss, weight gain, vision loss, decreased hearing, hoarseness, chest pain, syncope, dyspnea on exertion, peripheral edema, prolonged cough, headaches, hemoptysis, abdominal pain, melena, hematochezia, severe indigestion/heartburn, hematuria, incontinence, genital sores, muscle weakness, suspicious skin lesions, transient blindness, difficulty walking, depression, unusual weight change, abnormal bleeding, enlarged lymph nodes, angioedema, and breast masses.    Physical Exam  General:  well-developed, pale, and uncomfortable-appearing.   Head:  normocephalic and atraumatic.   Eyes:  pupils equal and pupils round.   Ears:  R ear normal and L ear normal.   Nose:  no external  deformity and no nasal discharge.   Mouth:  good dentition.   Neck:  No deformities, masses, or tenderness noted. Lungs:  normal respiratory effort and R base dullness.   Heart:  normal rate and regular rhythm.     Impression & Recommendations:  Problem # 1:  HYPOTENSION, ORTHOSTATIC (ICD-458.0) the pt has post dialysis hypotension often she needs saline folowing dialysis  Problem # 2:  MEMORY LOSS (ICD-780.93) stable  Problem # 3:  UNSPECIFIED VISUAL LOSS (ICD-369.9) macular degeneration and DM retinopaty Seeing [facility]in  Problem # 4:  END STAGE RENAL DISEASE (ICD-585.6) on dyalysis  Problem # 5:  DIABETES MELLITUS, TYPE II (ICD-250.00)  Her updated medication list for this problem includes:    Actos 45 Mg Tabs (Pioglitazone hcl) ..... Once daily    Lantus 100 Unit/ml Soln (Insulin glargine) .[name]... 9 units at bedtime  Labs Reviewed: Creat: 6.0 (11/13/2009)     Last Eye Exam: diabetic retinopathy (04/02/2008) Reviewed HgBA1c results: 6.0 (01/11/2009)  6.2 (03/14/2008)  Orders: Venipuncture (16109) Specimen Handling (60454) TLB-BMP (Basic Metabolic Panel-BMET) (80048-METABOL) TLB-A1C / Hgb A1C (Glycohemoglobin) (83036-A1C)  Complete Medication List: 1)  Dialyvite 3000 3 Mg Tabs (B complex-c-biotin-e-min-fa) .... Once daily 2)  Renagel 800 Mg Tabs (Sevelamer hcl) .... Once daily 3)  Reglan 5 Mg Tabs (Metoclopramide hcl) .... One by mouth q ac 4)  Actos 45 Mg Tabs (Pioglitazone hcl) .... Once daily 5)  Dexilant 60 Mg Cpdr (Dexlansoprazole) .... One by mouth dialy 6)  Lantus 100 Unit/ml Soln (Insulin glargine) .... 9 units at bedtime 7)  Cosopt 2-0.5 % Soln (Dorzolamide-timolol) .... Once daily 8)  Qualaquin 324 Mg Caps (Quinine sulfate) .... As needed leg cramps 9)  Tylenol Extra Strength 1000 Mg/3ml Liqd (Acetaminophen) .... As needed 10)  Ultram 50 Mg Tabs (Tramadol hcl) .... One by mouth three times a day as needed for pain 11)  Xyzal 5 Mg Tabs (Levocetirizine  dihydrochloride) .... 1/2 once daily 12)  Lexapro 10 Mg Tabs (Escitalopram oxalate) .... 1/2 by mouth daily 13)  Zofran 4 Mg Tabs (Ondansetron hcl) .... One by mouth q 4-6 hrs as needed nausea and vomiting  Hypertension Assessment/Plan:      The patient's hypertensive risk group is category C: Target organ damage and/or diabetes.  Today's blood pressure is 104/60.  Her blood pressure goal is < 130/80.  Patient Instructions: 1)  Please schedule a follow-up appointment in 3 months.   Orders Added: 1)  Venipuncture [36415] 2)  Est. Patient Level IV [81191] 3)  Specimen Handling [99000] 4)  TLB-BMP (Basic Metabolic Panel-BMET) [80048-METABOL] 5)  TLB-A1C / Hgb A1C (Glycohemoglobin) [83036-A1C]   Immunization History:  Influenza Immunization History:    Influenza:  historical (04/01/2010)   Immunization History:  Influenza Immunization History:    Influenza:  Historical (04/01/2010)

## 2010-07-03 NOTE — Letter (Signed)
Summary: [facility]  [facility]   Imported By: Maryln Gottron 08/20/2009 09:43:00  _____________________________________________________________________  External Attachment:    Type:   Image     Comment:   External Document

## 2010-07-03 NOTE — Progress Notes (Signed)
Summary: N/V  LMTCB 10/02/2009  Phone Note Call from Patient   Caller: Daughter Call For: Stacie Glaze MD Summary of Call: Pt's daughter called stating that  her Mom's graft stopped up and she missed 2 sessions of diaylsis.  She has been nauseated and vomiting some since (5 days).  Her Nephrologist is treating this, but wondered if Dr. Lovell Sheehan knew a RX better than Phenergan.  Advised to call Urology back, and see if they want to continue to handle this or if they feel is is Primary Care. Initial call taken by: Lynann Beaver CMA,  Oct 02, 2009 8:48 AM  Follow-up for Phone Call        may try generic zofran 4 mg 1 every 6-8 hours as needed nausea #30 Follow-up by: Willy Eddy, LPN,  Oct 02, 1608 9:57 AM  Additional Follow-up for Phone Call Additional follow up Details #1::        LMTCB Additional Follow-up by: Lynann Beaver CMA,  Oct 02, 2009 10:02 AM    New/Updated Medications: ZOFRAN 4 MG TABS (ONDANSETRON HCL) one by mouth q 4-6 hrs as needed nausea and vomiting Prescriptions: ZOFRAN 4 MG TABS (ONDANSETRON HCL) one by mouth q 4-6 hrs as needed nausea and vomiting  #30 x 0   Entered by:   Lynann Beaver CMA   Authorized by:   Stacie Glaze MD   Signed by:   Lynann Beaver CMA on 10/02/2009   Method used:   Electronically to        [facility]* (retail)       [address]       [place], [place]  [id number]       Ph: [phone]       Fax: [phone]   RxID:   [id number]  Pt's daughter notified.

## 2010-07-03 NOTE — Medication Information (Signed)
Summary: Order for Diabetic Testing Supplies  Order for Diabetic Testing Supplies   Imported By: Maryln Gottron 07/08/2009 09:43:52  _____________________________________________________________________  External Attachment:    Type:   Image     Comment:   External Document

## 2010-07-03 NOTE — Assessment & Plan Note (Signed)
Summary: 2 month fup//ccm   Vital Signs:  Patient profile:   77 year old female Height:      64 inches Weight:      166 pounds BMI:     28.60 Temp:     98.3 degrees F oral Pulse rate:   68 / minute Resp:     14 per minute BP sitting:   110 / 60  (left arm)  Vitals Entered By: Willy Eddy, LPN (January 15, 2010 11:43 AM)  Nutrition Counseling: Patient's BMI is greater than 25 and therefore counseled on weight management options. CC: roa-continues to have dilaysis 3 times a week Is Patient Diabetic? Yes Did you bring your meter with you today? No Nutritional Status BMI of 25 - 29 = overweight  Does patient need assistance? Functional Status Cook/clean, Shopping, Social activities Ambulation Impaired:Risk for fall   Primary Care Provider:  Stacie Glaze MD  CC:  roa-continues to have dilaysis 3 times a week.  History of Present Illness: follow up for DM, HTN and GERD in p followed in the dyalysis centers has improved marginally from her extreme fatigue    Follow-Up Visit      This is a 77 year old woman who presents for Follow-up visit.  The patient complains of low blood sugar symptoms, but denies chest pain, palpitations, dizziness, syncope, high blood sugar symptoms, edema, SOB, DOE, PND, and orthopnea.  Since the last visit the patient notes problems with medications and being seen by a specialist.  The patient reports taking meds as prescribed and monitoring BP.  When questioned about possible medication side effects, the patient notes cramping and fatigue.    Preventive Screening-Counseling & Management  Alcohol-Tobacco     Smoking Status: never  Problems Prior to Update: 1)  Hypotension, Orthostatic  (ICD-458.0) 2)  Degenerative Joint Disease, Advanced  (ICD-715.90) 3)  Memory Loss  (ICD-780.93) 4)  Hyperlipidemia, Mild  (ICD-272.4) 5)  End Stage Renal Disease  (ICD-585.6) 6)  Osteoarthritis  (ICD-715.90) 7)  Hypertension  (ICD-401.9) 8)  Gerd   (ICD-530.81) 9)  Diabetes Mellitus, Type II  (ICD-250.00)  Current Problems (verified): 1)  Hypotension, Orthostatic  (ICD-458.0) 2)  Degenerative Joint Disease, Advanced  (ICD-715.90) 3)  Memory Loss  (ICD-780.93) 4)  Hyperlipidemia, Mild  (ICD-272.4) 5)  End Stage Renal Disease  (ICD-585.6) 6)  Osteoarthritis  (ICD-715.90) 7)  Hypertension  (ICD-401.9) 8)  Gerd  (ICD-530.81) 9)  Diabetes Mellitus, Type II  (ICD-250.00)  Medications Prior to Update: 1)  Dialyvite 3000 3 Mg  Tabs (B Complex-C-Biotin-E-Min-Fa) .... Once Daily 2)  Renagel 800 Mg  Tabs (Sevelamer Hcl) .... Once Daily 3)  Reglan 5 Mg  Tabs (Metoclopramide Hcl) .... One By Mouth Q Ac 4)  Actos 45 Mg  Tabs (Pioglitazone Hcl) .... Once Daily 5)  Simvastatin 80 Mg  Tabs (Simvastatin) .... Once Daily 6)  Dexilant 60 Mg Cpdr (Dexlansoprazole) .... One By Mouth Dialy 7)  Lantus 100 Unit/ml  Soln (Insulin Glargine) .... 9 Units At Bedtime 8)  Cosopt 2-0.5 %  Soln (Dorzolamide-Timolol) .... Once Daily 9)  Qualaquin 324 Mg  Caps (Quinine Sulfate) .... As Needed Leg Cramps 10)  Tylenol Extra Strength 1000 Mg/74ml  Liqd (Acetaminophen) .... As Needed 11)  Ultram 50 Mg  Tabs (Tramadol Hcl) .... One By Mouth Three Times A Day As Needed For Pain 12)  Xyzal 5 Mg  Tabs (Levocetirizine Dihydrochloride) .... 1/2 Once Daily 13)  Lexapro 10 Mg Tabs (Escitalopram Oxalate) .Marland KitchenMarland KitchenMarland Kitchen  1/2 By Mouth Daily 14)  Zofran 4 Mg Tabs (Ondansetron Hcl) .... One By Mouth Q 4-6 Hrs As Needed Nausea and Vomiting  Current Medications (verified): 1)  Dialyvite 3000 3 Mg  Tabs (B Complex-C-Biotin-E-Min-Fa) .... Once Daily 2)  Renagel 800 Mg  Tabs (Sevelamer Hcl) .... Once Daily 3)  Reglan 5 Mg  Tabs (Metoclopramide Hcl) .... One By Mouth Q Ac 4)  Actos 45 Mg  Tabs (Pioglitazone Hcl) .... Once Daily 5)  Simvastatin 80 Mg  Tabs (Simvastatin) .... Once Daily 6)  Dexilant 60 Mg Cpdr (Dexlansoprazole) .... One By Mouth Dialy 7)  Lantus 100 Unit/ml  Soln (Insulin  Glargine) .... 9 Units At Bedtime 8)  Cosopt 2-0.5 %  Soln (Dorzolamide-Timolol) .... Once Daily 9)  Qualaquin 324 Mg  Caps (Quinine Sulfate) .... As Needed Leg Cramps 10)  Tylenol Extra Strength 1000 Mg/51ml  Liqd (Acetaminophen) .... As Needed 11)  Ultram 50 Mg  Tabs (Tramadol Hcl) .... One By Mouth Three Times A Day As Needed For Pain 12)  Xyzal 5 Mg  Tabs (Levocetirizine Dihydrochloride) .... 1/2 Once Daily 13)  Lexapro 10 Mg Tabs (Escitalopram Oxalate) .... 1/2 By Mouth Daily 14)  Zofran 4 Mg Tabs (Ondansetron Hcl) .... One By Mouth Q 4-6 Hrs As Needed Nausea and Vomiting  Allergies (verified): No Known Drug Allergies  Past History:  Social History: Last updated: 01/12/2007 Retired Married Never Smoked Alcohol use-no Drug use-no  Risk Factors: Exercise: yes (02/25/2007)  Risk Factors: Smoking Status: never (01/15/2010)  Past medical, surgical, family and social histories (including risk factors) reviewed, and no changes noted (except as noted below).  Past Medical History: Reviewed history from 03/08/2009 and no changes required. Cough Diabetes mellitus, type II GERD Hypertension Bronchitis Osteoarthritis end stage renal dz  Past Surgical History: Reviewed history from 02/25/2007 and no changes required. Cataract extraction Colonoscopy dialysis access  Family History: Reviewed history and no changes required.  Social History: Reviewed history from 01/12/2007 and no changes required. Retired Married Never Smoked Alcohol use-no Drug use-no  Review of Systems       The patient complains of decreased hearing, dyspnea on exertion, difficulty walking, and depression.  The patient denies anorexia, fever, weight loss, weight gain, vision loss, hoarseness, chest pain, syncope, peripheral edema, prolonged cough, headaches, hemoptysis, abdominal pain, melena, hematochezia, severe indigestion/heartburn, hematuria, incontinence, genital sores, muscle weakness,  suspicious skin lesions, transient blindness, unusual weight change, abnormal bleeding, enlarged lymph nodes, angioedema, and breast masses.    Physical Exam  General:  well-developed, pale, and uncomfortable-appearing.   Head:  normocephalic and atraumatic.   Eyes:  pupils equal and pupils round.   Ears:  R ear normal and L ear normal.   Lungs:  normal respiratory effort and R base dullness.   Heart:  normal rate and regular rhythm.   Abdomen:  soft, non-tender, and normal bowel sounds.   Msk:  decreased ROM and joint tenderness.   Extremities:  1+ left pedal edema and 1+ right pedal edema.   Neurologic:  alert & oriented X3 and finger-to-nose normal.     Impression & Recommendations:  Problem # 1:  HYPOTENSION, ORTHOSTATIC (ICD-458.0) Assessment Improved resolved  Problem # 2:  DEGENERATIVE JOINT DISEASE, ADVANCED (ICD-715.90) Assessment: Deteriorated avoid nsaids, use of ice and heat therapy doiscussed Her updated medication list for this problem includes:    Tylenol Extra Strength 1000 Mg/45ml Liqd (Acetaminophen) .[name]... As needed    Ultram 50 Mg Tabs (Tramadol hcl) ..... One by mouth  three times a day as needed for pain  Problem # 3:  MEMORY LOSS (ICD-780.93) stable  Problem # 4:  HYPERLIPIDEMIA, MILD (ICD-272.4) stop 80 of simvastatin and consider not replacement due to age and condition The following medications were removed from the medication list:    Simvastatin 80 Mg Tabs (Simvastatin) ..... Once daily  Labs Reviewed: SGOT: 18 (04/21/2006)   SGPT: 14 (04/21/2006)  Lipid Goals: Chol Goal: 200 (09/09/2009)   HDL Goal: 40 (09/09/2009)   LDL Goal: 100 (09/09/2009)   TG Goal: 150 (09/09/2009)  Prior 10 Yr Risk Heart Disease: Not enough information (09/07/2008)   HDL:45.30 (01/11/2009)  Chol:165 (01/11/2009)  Problem # 5:  DIABETES MELLITUS, TYPE II (ICD-250.00) oniter a1c Her updated medication list for this problem includes:    Actos 45 Mg Tabs (Pioglitazone  hcl) ..... Once daily    Lantus 100 Unit/ml Soln (Insulin glargine) .[name]... 9 units at bedtime  Labs Reviewed: Creat: 6.0 (11/13/2009)     Last Eye Exam: diabetic retinopathy (04/02/2008) Reviewed HgBA1c results: 6.0 (01/11/2009)  6.2 (03/14/2008)  Complete Medication List: 1)  Dialyvite 3000 3 Mg Tabs (B complex-c-biotin-e-min-fa) .... Once daily 2)  Renagel 800 Mg Tabs (Sevelamer hcl) .... Once daily 3)  Reglan 5 Mg Tabs (Metoclopramide hcl) .... One by mouth q ac 4)  Actos 45 Mg Tabs (Pioglitazone hcl) .... Once daily 5)  Dexilant 60 Mg Cpdr (Dexlansoprazole) .... One by mouth dialy 6)  Lantus 100 Unit/ml Soln (Insulin glargine) .... 9 units at bedtime 7)  Cosopt 2-0.5 % Soln (Dorzolamide-timolol) .... Once daily 8)  Qualaquin 324 Mg Caps (Quinine sulfate) .... As needed leg cramps 9)  Tylenol Extra Strength 1000 Mg/62ml Liqd (Acetaminophen) .... As needed 10)  Ultram 50 Mg Tabs (Tramadol hcl) .... One by mouth three times a day as needed for pain 11)  Xyzal 5 Mg Tabs (Levocetirizine dihydrochloride) .... 1/2 once daily 12)  Lexapro 10 Mg Tabs (Escitalopram oxalate) .... 1/2 by mouth daily 13)  Zofran 4 Mg Tabs (Ondansetron hcl) .... One by mouth q 4-6 hrs as needed nausea and vomiting  Patient Instructions: 1)  Please schedule a follow-up appointment in 3 months.

## 2010-07-03 NOTE — Letter (Signed)
Summary: [facility]  [facility]   Imported By: Maryln Gottron 09/03/2009 10:57:30  _____________________________________________________________________  External Attachment:    Type:   Image     Comment:   External Document

## 2010-07-03 NOTE — Progress Notes (Signed)
Summary: generic Lexapro  Phone Note Call from Patient   Caller: Daughter Call For: Stacie Glaze MD Summary of Call: Nicolette Bang [facility]) Needs generic Lexapro sent in today. Initial call taken by: Lynann Beaver CMA,  January 06, 2010 2:33 PM    Prescriptions: LEXAPRO 10 MG TABS (ESCITALOPRAM OXALATE) 1/2 by mouth daily  #30 x 3   Entered by:   Willy Eddy, LPN   Authorized by:   Stacie Glaze MD   Signed by:   Willy Eddy, LPN on 60/45/4098   Method used:   Electronically to        [facility]  [address] (retail)       [address], [address]       [place], [place]  [address]       Ph: [phone]       Fax: [phone]   RxID:   [id number]

## 2010-07-03 NOTE — Progress Notes (Signed)
Summary: Call A Nurse  Phone Note Call from Patient   Summary of Call: critical [facility] Initial call taken by: Willy Eddy, LPN,  April 17, 2010 8:50 AM     Call-A-Nurse Triage Call Report Triage Record Num: [id number] Operator: Peri Jefferson Patient Name: [name] Call Date & Time: 04/16/2010 6:53:17PM Patient Phone: [phone] PCP: Darryll Capers Patient Gender: Female PCP Fax : [phone] Patient DOB: Oct 30, 1928 Practice Name: [facility] Reason for Call: [facility] calling to report critical Creatinine of 6.8. Drawn today @ 1045. BUN 30. Prior Creatinine June 2011 was 6.0. Called and informed Dr. Felicity Coyer. No additional orders received. Documented and faxed to office. Protocol(s) Used: Office Note Recommended Outcome per Protocol: Information Noted and Sent to Office Reason for Outcome: Caller information to office Care Advice:  ~

## 2010-07-03 NOTE — Miscellaneous (Signed)
Summary: [facility]  [facility]   Imported By: Maryln Gottron 11/19/2009 15:36:54  _____________________________________________________________________  External Attachment:    Type:   Image     Comment:   External Document

## 2010-07-03 NOTE — Progress Notes (Signed)
Summary: Schedule overdue recall colon  Phone Note Outgoing Call Call back at [facility] Phone [phone]   Call placed by: Darcey Nora RN, CGRN,  Oct 23, 2009 3:47 PM Call placed to: Patient Summary of Call: I spoke with the patient about scheduling her overdue recall colon.  Patient  declines to schedule, due to multiple other health problems.   Initial call taken by: Darcey Nora RN, CGRN,  Oct 23, 2009 3:48 PM

## 2010-07-03 NOTE — Miscellaneous (Signed)
Summary: [facility]ician's Orders/[facility]  Physician's Orders/[facility]   Imported By: Maryln Gottron 10/17/2009 10:57:00  _____________________________________________________________________  External Attachment:    Type:   Image     Comment:   External Document

## 2010-07-03 NOTE — Miscellaneous (Signed)
Summary: Face to Face Encounter/[facility]  Face to Face Encounter/[facility]   Imported By: Maryln Gottron 10/15/2009 09:41:07  _____________________________________________________________________  External Attachment:    Type:   Image     Comment:   External Document

## 2010-07-03 NOTE — Assessment & Plan Note (Signed)
Summary: 2 month rov/njr/pt rsc/cjr   Vital Signs:  Patient profile:   77 year old female Height:      64 inches Temp:     98.2 degrees F oral Pulse rate:   68 / minute Resp:     14 per minute BP sitting:   90 / 40  (left arm)  Vitals Entered By: Willy Eddy, LPN (November 13, 2009 10:17 AM) CC: roa-taking physical therapy 3 times a week which is helping, Hypertension Management   CC:  roa-taking physical therapy 3 times a week which is helping and Hypertension Management.  History of Present Illness: very weak and noted hypotension with gate disorder and profound weakness dialysis yesterday the pt appears to have some clamping down with cold extremities and weakness dm SUGARS ARE RUNNING IN THE 100 RANGE AND SHE HAS NOT NOTED ANY HYPGLYCEMIA has required saline infusion post dialysis  Hypertension History:      She complains of dyspnea with exertion, orthopnea, PND, syncope, and side effects from treatment.  DIALYSIS.        Positive major cardiovascular risk factors include female age 10 years old or older, diabetes, hyperlipidemia, and hypertension.  Negative major cardiovascular risk factors include non-tobacco-user status.        Positive history for target organ damage include renal insufficiency.     Preventive Screening-Counseling & Management  Alcohol-Tobacco     Smoking Status: never  Problems Prior to Update: 1)  Degenerative Joint Disease, Advanced  (ICD-715.90) 2)  Memory Loss  (ICD-780.93) 3)  Hyperlipidemia, Mild  (ICD-272.4) 4)  End Stage Renal Disease  (ICD-585.6) 5)  Osteoarthritis  (ICD-715.90) 6)  Hypertension  (ICD-401.9) 7)  Gerd  (ICD-530.81) 8)  Diabetes Mellitus, Type II  (ICD-250.00)  Current Problems (verified): 1)  Degenerative Joint Disease, Advanced  (ICD-715.90) 2)  Memory Loss  (ICD-780.93) 3)  Hyperlipidemia, Mild  (ICD-272.4) 4)  End Stage Renal Disease  (ICD-585.6) 5)  Osteoarthritis  (ICD-715.90) 6)  Hypertension   (ICD-401.9) 7)  Gerd  (ICD-530.81) 8)  Diabetes Mellitus, Type II  (ICD-250.00)  Medications Prior to Update: 1)  Dialyvite 3000 3 Mg  Tabs (B Complex-C-Biotin-E-Min-Fa) .... Once Daily 2)  Renagel 800 Mg  Tabs (Sevelamer Hcl) .... Once Daily 3)  Reglan 5 Mg  Tabs (Metoclopramide Hcl) .... One By Mouth Q Ac 4)  Actos 45 Mg  Tabs (Pioglitazone Hcl) .... Once Daily 5)  Simvastatin 80 Mg  Tabs (Simvastatin) .... Once Daily 6)  Dexilant 60 Mg Cpdr (Dexlansoprazole) .... One By Mouth Dialy 7)  Lantus 100 Unit/ml  Soln (Insulin Glargine) .... 9 Units At Bedtime 8)  Cosopt 2-0.5 %  Soln (Dorzolamide-Timolol) .... Once Daily 9)  Qualaquin 324 Mg  Caps (Quinine Sulfate) .... As Needed Leg Cramps 10)  Tylenol Extra Strength 1000 Mg/75ml  Liqd (Acetaminophen) .... As Needed 11)  Ultram 50 Mg  Tabs (Tramadol Hcl) .... One By Mouth Three Times A Day As Needed For Pain 12)  Xyzal 5 Mg  Tabs (Levocetirizine Dihydrochloride) .... 1/2 Once Daily 13)  Lexapro 10 Mg Tabs (Escitalopram Oxalate) .... 1/2 By Mouth Daily 14)  Zofran 4 Mg Tabs (Ondansetron Hcl) .... One By Mouth Q 4-6 Hrs As Needed Nausea and Vomiting  Current Medications (verified): 1)  Dialyvite 3000 3 Mg  Tabs (B Complex-C-Biotin-E-Min-Fa) .... Once Daily 2)  Renagel 800 Mg  Tabs (Sevelamer Hcl) .... Once Daily 3)  Reglan 5 Mg  Tabs (Metoclopramide Hcl) .... One By Mouth Q Ac  4)  Actos 45 Mg  Tabs (Pioglitazone Hcl) .... Once Daily 5)  Simvastatin 80 Mg  Tabs (Simvastatin) .... Once Daily 6)  Dexilant 60 Mg Cpdr (Dexlansoprazole) .... One By Mouth Dialy 7)  Lantus 100 Unit/ml  Soln (Insulin Glargine) .... 9 Units At Bedtime 8)  Cosopt 2-0.5 %  Soln (Dorzolamide-Timolol) .... Once Daily 9)  Qualaquin 324 Mg  Caps (Quinine Sulfate) .... As Needed Leg Cramps 10)  Tylenol Extra Strength 1000 Mg/3ml  Liqd (Acetaminophen) .... As Needed 11)  Ultram 50 Mg  Tabs (Tramadol Hcl) .... One By Mouth Three Times A Day As Needed For Pain 12)  Xyzal 5  Mg  Tabs (Levocetirizine Dihydrochloride) .... 1/2 Once Daily 13)  Lexapro 10 Mg Tabs (Escitalopram Oxalate) .... 1/2 By Mouth Daily 14)  Zofran 4 Mg Tabs (Ondansetron Hcl) .... One By Mouth Q 4-6 Hrs As Needed Nausea and Vomiting  Allergies (verified): No Known Drug Allergies  Past History:  Social History: Last updated: 01/12/2007 Retired Married Never Smoked Alcohol use-no Drug use-no  Risk Factors: Exercise: yes (02/25/2007)  Risk Factors: Smoking Status: never (11/13/2009)  Past medical, surgical, family and social histories (including risk factors) reviewed, and no changes noted (except as noted below).  Past Medical History: Reviewed history from 03/08/2009 and no changes required. Cough Diabetes mellitus, type II GERD Hypertension Bronchitis Osteoarthritis end stage renal dz  Past Surgical History: Reviewed history from 02/25/2007 and no changes required. Cataract extraction Colonoscopy dialysis access  Family History: Reviewed history and no changes required.  Social History: Reviewed history from 01/12/2007 and no changes required. Retired Married Never Smoked Alcohol use-no Drug use-no  Review of Systems       The patient complains of weight loss and syncope.  The patient denies anorexia, fever, weight gain, vision loss, decreased hearing, hoarseness, chest pain, dyspnea on exertion, peripheral edema, prolonged cough, headaches, hemoptysis, abdominal pain, melena, hematochezia, severe indigestion/heartburn, hematuria, incontinence, genital sores, muscle weakness, suspicious skin lesions, transient blindness, difficulty walking, depression, unusual weight change, abnormal bleeding, enlarged lymph nodes, angioedema, and breast masses.    Physical Exam  General:  Well-developed,well-nourished,in no acute distress; alert,appropriate and cooperative throughout examination Head:  Normocephalic and atraumatic without obvious abnormalities. No apparent  alopecia or balding. Eyes:  pupils equal and pupils round.   Ears:  R ear normal and L ear normal.   Nose:  no external deformity and no nasal discharge.   Mouth:  good dentition.   Neck:  No deformities, masses, or tenderness noted. Lungs:  normal respiratory effort and no intercostal retractions.   Heart:  normal rate and regular rhythm.   Abdomen:  soft, non-tender, and normal bowel sounds.   Msk:  normal ROM and no joint tenderness.   Extremities:  trace left pedal edema and trace right pedal edema.   Neurologic:  alert & oriented X3 and finger-to-nose normal.     Complete Medication List: 1)  Dialyvite 3000 3 Mg Tabs (B complex-c-biotin-e-min-fa) .... Once daily 2)  Renagel 800 Mg Tabs (Sevelamer hcl) .... Once daily 3)  Reglan 5 Mg Tabs (Metoclopramide hcl) .... One by mouth q ac 4)  Actos 45 Mg Tabs (Pioglitazone hcl) .... Once daily 5)  Simvastatin 80 Mg Tabs (Simvastatin) .... Once daily 6)  Dexilant 60 Mg Cpdr (Dexlansoprazole) .... One by mouth dialy 7)  Lantus 100 Unit/ml Soln (Insulin glargine) .... 9 units at bedtime 8)  Cosopt 2-0.5 % Soln (Dorzolamide-timolol) .... Once daily 9)  Qualaquin 324 Mg Caps (  Quinine sulfate) .... As needed leg cramps 10)  Tylenol Extra Strength 1000 Mg/52ml Liqd (Acetaminophen) .... As needed 11)  Ultram 50 Mg Tabs (Tramadol hcl) .... One by mouth three times a day as needed for pain 12)  Xyzal 5 Mg Tabs (Levocetirizine dihydrochloride) .... 1/2 once daily 13)  Lexapro 10 Mg Tabs (Escitalopram oxalate) .... 1/2 by mouth daily 14)  Zofran 4 Mg Tabs (Ondansetron hcl) .... One by mouth q 4-6 hrs as needed nausea and vomiting  Other Orders: Venipuncture (41324) TLB-BMP (Basic Metabolic Panel-BMET) (80048-METABOL) TLB-CBC Platelet - w/Differential (85025-CBCD) TLB-IBC Pnl (Iron/FE;Transferrin) (83550-IBC) TLB-B12 + Folate Pnl (82746_82607-B12/FOL) TLB-TSH (Thyroid Stimulating Hormone) (84443-TSH)  Hypertension Assessment/Plan:      The  patient's hypertensive risk group is category C: Target organ damage and/or diabetes.  Today's blood pressure is 90/40.  Her blood pressure goal is < 130/80.  Patient Instructions: 1)  get some g2 and drink a 20 oz a day 2)  Please schedule a follow-up appointment in 2 months.

## 2010-07-03 NOTE — Procedures (Signed)
Summary: Recall / [facility]  Recall / [facility]   Imported By: Lennie Odor 10/31/2009 15:38:31  _____________________________________________________________________  External Attachment:    Type:   Image     Comment:   External Document

## 2010-07-03 NOTE — Letter (Signed)
Summary: [facility]  [facility]   Imported By: Maryln Gottron 07/09/2009 14:02:40  _____________________________________________________________________  External Attachment:    Type:   Image     Comment:   External Document

## 2010-07-03 NOTE — Assessment & Plan Note (Signed)
Summary: 3 MNTH ROV//SLM   Vital Signs:  Patient profile:   77 year old female Height:      64 inches Weight:      166 pounds BMI:     28.60 Temp:     98.1 degrees F oral Pulse rate:   72 / minute Resp:     14 per minute BP sitting:   120 / 70  (left arm)  Vitals Entered By: Willy Eddy, LPN (September 09, 2009 12:01 PM) CC: roa-stopped novalog-it was used as sliding scale and blood sugars are too low to use, Hypertension Management, Lipid Management   CC:  roa-stopped novalog-it was used as sliding scale and blood sugars are too low to use, Hypertension Management, and Lipid Management.  History of Present Illness: Pt's condition has declined and she is havving difficulty with ambulation and transfers pt states DM is well controlled with CBG's in the 100-120 range  Hypertension History:      She denies headache, chest pain, palpitations, dyspnea with exertion, orthopnea, PND, peripheral edema, visual symptoms, neurologic problems, syncope, and side effects from treatment.        Positive major cardiovascular risk factors include female age 68 years old or older, diabetes, hyperlipidemia, and hypertension.  Negative major cardiovascular risk factors include non-tobacco-user status.        Positive history for target organ damage include renal insufficiency.    Lipid Management History:      Positive NCEP/ATP III risk factors include female age 32 years old or older, diabetes, and hypertension.  Negative NCEP/ATP III risk factors include non-tobacco-user status.       Preventive Screening-Counseling & Management  Alcohol-Tobacco     Smoking Status: never  Current Problems (verified): 1)  Memory Loss  (ICD-780.93) 2)  Hyperlipidemia, Mild  (ICD-272.4) 3)  End Stage Renal Disease  (ICD-585.6) 4)  Osteoarthritis  (ICD-715.90) 5)  Hypertension  (ICD-401.9) 6)  Gerd  (ICD-530.81) 7)  Diabetes Mellitus, Type II  (ICD-250.00)  Current Medications (verified): 1)  Dialyvite  3000 3 Mg  Tabs (B Complex-C-Biotin-E-Min-Fa) .... Once Daily 2)  Renagel 800 Mg  Tabs (Sevelamer Hcl) .... Once Daily 3)  Reglan 5 Mg  Tabs (Metoclopramide Hcl) .... One By Mouth Q Ac 4)  Actos 45 Mg  Tabs (Pioglitazone Hcl) .... Once Daily 5)  Simvastatin 80 Mg  Tabs (Simvastatin) .... Once Daily 6)  Dexilant 60 Mg Cpdr (Dexlansoprazole) .... One By Mouth Dialy 7)  Lantus 100 Unit/ml  Soln (Insulin Glargine) .... 9 Units At Bedtime 8)  Cosopt 2-0.5 %  Soln (Dorzolamide-Timolol) .... Once Daily 9)  Qualaquin 324 Mg  Caps (Quinine Sulfate) .... As Needed Leg Cramps 10)  Tylenol Extra Strength 1000 Mg/63ml  Liqd (Acetaminophen) .... As Needed 11)  Ultram 50 Mg  Tabs (Tramadol Hcl) .... One By Mouth Three Times A Day As Needed For Pain 12)  Xyzal 5 Mg  Tabs (Levocetirizine Dihydrochloride) .... 1/2 Once Daily 13)  Lexapro 10 Mg Tabs (Escitalopram Oxalate) .... 1/2 By Mouth Daily  Allergies (verified): No Known Drug Allergies   Impression & Recommendations:  Problem # 1:  DIABETES MELLITUS, TYPE II (ICD-250.00) her a1c was monitered at dialysis and we have requested a copy The following medications were removed from the medication list:    Novolog 100 Unit/ml Soln (Insulin aspart) .[name]... As needed if bs is greater than 1450 Her updated medication list for this problem includes:    Actos 45 Mg Tabs (Pioglitazone hcl) .Marland KitchenMarland KitchenMarland KitchenMarland Kitchen  Once daily    Lantus 100 Unit/ml Soln (Insulin glargine) .[name]... 9 units at bedtime  Labs Reviewed: Creat: 8.5 (02/07/2009)     Last Eye Exam: diabetic retinopathy (04/02/2008) Reviewed HgBA1c results: 6.0 (01/11/2009)  6.2 (03/14/2008)  Problem # 2:  MEMORY LOSS (ICD-780.93) apparent decline in mental staus and functiong that i suspect ie largely due to ESRD  Problem # 3:  HYPERTENSION (ICD-401.9)  BP today: 120/70 Prior BP: 134/72 (06/10/2009)  Prior 10 Yr Risk Heart Disease: Not enough information (09/07/2008)  Labs Reviewed: K+: 4.0 (02/07/2009) Creat: :  8.5 (02/07/2009)   Chol: 165 (01/11/2009)   HDL: 45.30 (01/11/2009)     Problem # 4:  OSTEOARTHRITIS (ICD-715.90) progressive  mobility issues Her updated medication list for this problem includes:    Tylenol Extra Strength 1000 Mg/43ml Liqd (Acetaminophen) .[name]... As needed    Ultram 50 Mg Tabs (Tramadol hcl) ..... One by mouth three times a day as needed for pain  Discussed use of medications, application of heat or cold, and exercises.   Complete Medication List: 1)  Dialyvite 3000 3 Mg Tabs (B complex-c-biotin-e-min-fa) .... Once daily 2)  Renagel 800 Mg Tabs (Sevelamer hcl) .... Once daily 3)  Reglan 5 Mg Tabs (Metoclopramide hcl) .... One by mouth q ac 4)  Actos 45 Mg Tabs (Pioglitazone hcl) .... Once daily 5)  Simvastatin 80 Mg Tabs (Simvastatin) .... Once daily 6)  Dexilant 60 Mg Cpdr (Dexlansoprazole) .... One by mouth dialy 7)  Lantus 100 Unit/ml Soln (Insulin glargine) .... 9 units at bedtime 8)  Cosopt 2-0.5 % Soln (Dorzolamide-timolol) .... Once daily 9)  Qualaquin 324 Mg Caps (Quinine sulfate) .... As needed leg cramps 10)  Tylenol Extra Strength 1000 Mg/44ml Liqd (Acetaminophen) .... As needed 11)  Ultram 50 Mg Tabs (Tramadol hcl) .... One by mouth three times a day as needed for pain 12)  Xyzal 5 Mg Tabs (Levocetirizine dihydrochloride) .... 1/2 once daily 13)  Lexapro 10 Mg Tabs (Escitalopram oxalate) .... 1/2 by mouth daily  Other Orders: Home Health Referral (Home Health)  Hypertension Assessment/Plan:      The patient's hypertensive risk group is category C: Target organ damage and/or diabetes.  Today's blood pressure is 120/70.  Her blood pressure goal is < 130/80.  Lipid Assessment/Plan:      Based on NCEP/ATP III, the patient's risk factor category is "history of diabetes".  The patient's lipid goals are as follows: Total cholesterol goal is 200; LDL cholesterol goal is 100; HDL cholesterol goal is 40; Triglyceride goal is 150.    Patient Instructions: 1)   Please schedule a follow-up appointment in 2 months.

## 2010-07-03 NOTE — Miscellaneous (Signed)
Summary: Physician's Orders/[facility]  Physician's Orders/[facility]   Imported By: Maryln Gottron 10/22/2009 11:15:45  _____________________________________________________________________  External Attachment:    Type:   Image     Comment:   External Document

## 2010-07-04 ENCOUNTER — Inpatient Hospital Stay (HOSPITAL_COMMUNITY)
Admission: EM | Admit: 2010-07-04 | Discharge: 2010-07-12 | DRG: 871 | Disposition: A | Discharge status: Home Health | Payer: Medicare Other | Attending: Internal Medicine | Admitting: Internal Medicine

## 2010-07-04 ENCOUNTER — Emergency Department (HOSPITAL_COMMUNITY): Payer: Medicare Other

## 2010-07-04 DIAGNOSIS — D649 Anemia, unspecified: Secondary | ICD-10-CM | POA: Diagnosis present

## 2010-07-04 DIAGNOSIS — F0391 Unspecified dementia with behavioral disturbance: Secondary | ICD-10-CM | POA: Diagnosis present

## 2010-07-04 DIAGNOSIS — I120 Hypertensive chronic kidney disease with stage 5 chronic kidney disease or end stage renal disease: Secondary | ICD-10-CM | POA: Diagnosis present

## 2010-07-04 DIAGNOSIS — I959 Hypotension, unspecified: Secondary | ICD-10-CM | POA: Diagnosis present

## 2010-07-04 DIAGNOSIS — A419 Sepsis, unspecified organism: Principal | ICD-10-CM | POA: Diagnosis present

## 2010-07-04 DIAGNOSIS — A4901 Methicillin susceptible Staphylococcus aureus infection, unspecified site: Secondary | ICD-10-CM | POA: Diagnosis present

## 2010-07-04 DIAGNOSIS — M79609 Pain in unspecified limb: Secondary | ICD-10-CM | POA: Diagnosis present

## 2010-07-04 DIAGNOSIS — E1149 Type 2 diabetes mellitus with other diabetic neurological complication: Secondary | ICD-10-CM | POA: Diagnosis present

## 2010-07-04 DIAGNOSIS — D72829 Elevated white blood cell count, unspecified: Secondary | ICD-10-CM | POA: Diagnosis present

## 2010-07-04 DIAGNOSIS — K3184 Gastroparesis: Secondary | ICD-10-CM | POA: Diagnosis present

## 2010-07-04 DIAGNOSIS — N2581 Secondary hyperparathyroidism of renal origin: Secondary | ICD-10-CM | POA: Diagnosis present

## 2010-07-04 DIAGNOSIS — R404 Transient alteration of awareness: Secondary | ICD-10-CM | POA: Diagnosis present

## 2010-07-04 DIAGNOSIS — L89609 Pressure ulcer of unspecified heel, unspecified stage: Secondary | ICD-10-CM | POA: Diagnosis present

## 2010-07-04 DIAGNOSIS — L89109 Pressure ulcer of unspecified part of back, unspecified stage: Secondary | ICD-10-CM | POA: Diagnosis present

## 2010-07-04 DIAGNOSIS — E785 Hyperlipidemia, unspecified: Secondary | ICD-10-CM | POA: Diagnosis present

## 2010-07-04 DIAGNOSIS — N390 Urinary tract infection, site not specified: Secondary | ICD-10-CM | POA: Diagnosis present

## 2010-07-04 DIAGNOSIS — F0390 Unspecified dementia without behavioral disturbance: Secondary | ICD-10-CM | POA: Diagnosis present

## 2010-07-04 DIAGNOSIS — K5900 Constipation, unspecified: Secondary | ICD-10-CM | POA: Diagnosis not present

## 2010-07-04 DIAGNOSIS — F03918 Unspecified dementia, unspecified severity, with other behavioral disturbance: Secondary | ICD-10-CM | POA: Diagnosis present

## 2010-07-04 DIAGNOSIS — M1990 Unspecified osteoarthritis, unspecified site: Secondary | ICD-10-CM | POA: Diagnosis present

## 2010-07-04 DIAGNOSIS — R209 Unspecified disturbances of skin sensation: Secondary | ICD-10-CM | POA: Diagnosis present

## 2010-07-04 DIAGNOSIS — L8993 Pressure ulcer of unspecified site, stage 3: Secondary | ICD-10-CM | POA: Diagnosis present

## 2010-07-04 DIAGNOSIS — N186 End stage renal disease: Secondary | ICD-10-CM | POA: Diagnosis present

## 2010-07-04 DIAGNOSIS — R1310 Dysphagia, unspecified: Secondary | ICD-10-CM | POA: Diagnosis present

## 2010-07-04 DIAGNOSIS — K219 Gastro-esophageal reflux disease without esophagitis: Secondary | ICD-10-CM | POA: Diagnosis present

## 2010-07-04 DIAGNOSIS — L8990 Pressure ulcer of unspecified site, unspecified stage: Secondary | ICD-10-CM | POA: Diagnosis present

## 2010-07-04 DIAGNOSIS — L89309 Pressure ulcer of unspecified buttock, unspecified stage: Secondary | ICD-10-CM | POA: Diagnosis present

## 2010-07-04 DIAGNOSIS — Z992 Dependence on renal dialysis: Secondary | ICD-10-CM

## 2010-07-04 LAB — POCT CARDIAC MARKERS
CKMB, poc: <1 ng/mL — ABNORMAL LOW (ref 1.0–8.0)
Myoglobin, poc: >500 ng/mL (ref 12–200)

## 2010-07-04 LAB — DIFFERENTIAL
Basophils Absolute: 0.1 10*3/uL (ref 0.0–0.1)
Basophils Relative: 1 % (ref 0–1)
Eosinophils Absolute: 0.3 10*3/uL (ref 0.0–0.7)
Eosinophils Relative: 4 % (ref 0–5)
Lymphocytes Relative: 24 % (ref 12–46)

## 2010-07-04 LAB — COMPREHENSIVE METABOLIC PANEL
BUN: 15 mg/dL (ref 6–23)
Calcium: 9.6 mg/dL (ref 8.4–10.5)
GFR calc non Af Amer: 8 mL/min — ABNORMAL LOW (ref >60)
Glucose, Bld: 94 mg/dL (ref 70–99)
Total Protein: 7.6 g/dL (ref 6.0–8.3)

## 2010-07-04 LAB — GLUCOSE, CAPILLARY: Glucose-Capillary: 92 mg/dL (ref 70–99)

## 2010-07-04 LAB — LACTIC ACID, PLASMA: Lactic Acid, Venous: 1.4 mmol/L (ref 0.5–2.2)

## 2010-07-04 LAB — CBC
Platelets: 192 10*3/uL (ref 150–400)
RDW: 18.5 % — ABNORMAL HIGH (ref 11.5–15.5)
WBC: 7.6 10*3/uL (ref 4.0–10.5)

## 2010-07-04 LAB — LIPASE, BLOOD: Lipase: 22 U/L (ref 11–59)

## 2010-07-05 ENCOUNTER — Inpatient Hospital Stay (HOSPITAL_COMMUNITY): Payer: Medicare Other

## 2010-07-05 LAB — CK TOTAL AND CKMB (NOT AT ARMC)
CK, MB: 1.4 ng/mL (ref 0.3–4.0)
Relative Index: 0.9 (ref 0.0–2.5)
Total CK: 150 U/L (ref 7–177)

## 2010-07-05 LAB — COMPREHENSIVE METABOLIC PANEL
ALT: 14 U/L (ref 0–35)
AST: 25 U/L (ref 0–37)
Albumin: 2.5 g/dL — ABNORMAL LOW (ref 3.5–5.2)
Alkaline Phosphatase: 81 U/L (ref 39–117)
Potassium: 3.9 mEq/L (ref 3.5–5.1)
Sodium: 139 mEq/L (ref 135–145)
Total Protein: 6.3 g/dL (ref 6.0–8.3)

## 2010-07-05 LAB — GLUCOSE, CAPILLARY
Glucose-Capillary: 98 mg/dL (ref 70–99)
Glucose-Capillary: 87 mg/dL (ref 70–99)
Glucose-Capillary: 118 mg/dL — ABNORMAL HIGH (ref 70–99)

## 2010-07-05 LAB — URINE MICROSCOPIC-ADD ON

## 2010-07-05 LAB — LIPID PANEL
LDL Cholesterol: 49 mg/dL (ref 0–99)
Total CHOL/HDL Ratio: 3.7 RATIO
Triglycerides: 99 mg/dL (ref <150)
VLDL: 20 mg/dL (ref 0–40)

## 2010-07-05 LAB — URINALYSIS, ROUTINE W REFLEX MICROSCOPIC
Protein, ur: >300 mg/dL — AB
Specific Gravity, Urine: 1.02 (ref 1.005–1.030)
Urobilinogen, UA: 0.2 mg/dL (ref 0.0–1.0)

## 2010-07-05 LAB — MRSA PCR SCREENING: MRSA by PCR: NEGATIVE

## 2010-07-06 ENCOUNTER — Inpatient Hospital Stay (HOSPITAL_COMMUNITY): Payer: Medicare Other

## 2010-07-06 LAB — GLUCOSE, CAPILLARY
Glucose-Capillary: 142 mg/dL — ABNORMAL HIGH (ref 70–99)
Glucose-Capillary: 128 mg/dL — ABNORMAL HIGH (ref 70–99)

## 2010-07-06 LAB — CBC
HCT: 25.9 % — ABNORMAL LOW (ref 36.0–46.0)
Hemoglobin: 7.8 g/dL — ABNORMAL LOW (ref 12.0–15.0)
MCHC: 30.1 g/dL (ref 30.0–36.0)
RBC: 2.9 MIL/uL — ABNORMAL LOW (ref 3.87–5.11)

## 2010-07-06 LAB — RENAL FUNCTION PANEL
CO2: 27 mEq/L (ref 19–32)
Calcium: 8.9 mg/dL (ref 8.4–10.5)
Chloride: 98 mEq/L (ref 96–112)
Glucose, Bld: 112 mg/dL — ABNORMAL HIGH (ref 70–99)
Potassium: 3.6 mEq/L (ref 3.5–5.1)
Sodium: 134 mEq/L — ABNORMAL LOW (ref 135–145)

## 2010-07-06 LAB — TROPONIN I
Troponin I: 0.02 ng/mL (ref 0.00–0.06)
Troponin I: 0.02 ng/mL (ref 0.00–0.06)

## 2010-07-07 ENCOUNTER — Inpatient Hospital Stay (HOSPITAL_COMMUNITY): Payer: Medicare Other

## 2010-07-07 LAB — GLUCOSE, CAPILLARY
Glucose-Capillary: 124 mg/dL — ABNORMAL HIGH (ref 70–99)
Glucose-Capillary: 139 mg/dL — ABNORMAL HIGH (ref 70–99)
Glucose-Capillary: 150 mg/dL — ABNORMAL HIGH (ref 70–99)
Glucose-Capillary: 106 mg/dL — ABNORMAL HIGH (ref 70–99)
Glucose-Capillary: 145 mg/dL — ABNORMAL HIGH (ref 70–99)

## 2010-07-07 LAB — CBC
HCT: 28.4 % — ABNORMAL LOW (ref 36.0–46.0)
Hemoglobin: 8.5 g/dL — ABNORMAL LOW (ref 12.0–15.0)
MCV: 89.6 fL (ref 78.0–100.0)
RDW: 18.6 % — ABNORMAL HIGH (ref 11.5–15.5)
WBC: 9.6 10*3/uL (ref 4.0–10.5)

## 2010-07-07 LAB — RENAL FUNCTION PANEL
Albumin: 2.6 g/dL — ABNORMAL LOW (ref 3.5–5.2)
BUN: 35 mg/dL — ABNORMAL HIGH (ref 6–23)
CO2: 24 mEq/L (ref 19–32)
Chloride: 99 mEq/L (ref 96–112)
Creatinine, Ser: 9.71 mg/dL — ABNORMAL HIGH (ref 0.4–1.2)
Glucose, Bld: 135 mg/dL — ABNORMAL HIGH (ref 70–99)
Potassium: 4.6 mEq/L (ref 3.5–5.1)

## 2010-07-07 LAB — URINE CULTURE
Colony Count: >=100000
Culture  Setup Time: 201202041708

## 2010-07-08 ENCOUNTER — Inpatient Hospital Stay (HOSPITAL_COMMUNITY): Payer: Medicare Other

## 2010-07-08 LAB — CBC
HCT: 26.8 % — ABNORMAL LOW (ref 36.0–46.0)
MCH: 26.7 pg (ref 26.0–34.0)
MCV: 88.4 fL (ref 78.0–100.0)
Platelets: 190 10*3/uL (ref 150–400)
RDW: 18.6 % — ABNORMAL HIGH (ref 11.5–15.5)
WBC: 10 10*3/uL (ref 4.0–10.5)

## 2010-07-08 LAB — GLUCOSE, CAPILLARY
Glucose-Capillary: 106 mg/dL — ABNORMAL HIGH (ref 70–99)
Glucose-Capillary: 117 mg/dL — ABNORMAL HIGH (ref 70–99)
Glucose-Capillary: 151 mg/dL — ABNORMAL HIGH (ref 70–99)

## 2010-07-08 LAB — RENAL FUNCTION PANEL
BUN: 46 mg/dL — ABNORMAL HIGH (ref 6–23)
Chloride: 103 mEq/L (ref 96–112)
Creatinine, Ser: 11.38 mg/dL — ABNORMAL HIGH (ref 0.4–1.2)
Glucose, Bld: 101 mg/dL — ABNORMAL HIGH (ref 70–99)
Phosphorus: 5 mg/dL — ABNORMAL HIGH (ref 2.3–4.6)
Potassium: 4.5 mEq/L (ref 3.5–5.1)

## 2010-07-09 LAB — GLUCOSE, CAPILLARY
Glucose-Capillary: 135 mg/dL — ABNORMAL HIGH (ref 70–99)
Glucose-Capillary: 137 mg/dL — ABNORMAL HIGH (ref 70–99)
Glucose-Capillary: 156 mg/dL — ABNORMAL HIGH (ref 70–99)
Glucose-Capillary: 81 mg/dL (ref 70–99)

## 2010-07-09 LAB — COMPREHENSIVE METABOLIC PANEL
AST: 41 U/L — ABNORMAL HIGH (ref 0–37)
Albumin: 2.7 g/dL — ABNORMAL LOW (ref 3.5–5.2)
CO2: 27 mEq/L (ref 19–32)
Calcium: 9.7 mg/dL (ref 8.4–10.5)
Creatinine, Ser: 5.32 mg/dL — ABNORMAL HIGH (ref 0.4–1.2)
GFR calc Af Amer: 9 mL/min — ABNORMAL LOW (ref >60)
GFR calc non Af Amer: 8 mL/min — ABNORMAL LOW (ref >60)

## 2010-07-09 LAB — CBC
MCH: 26.4 pg (ref 26.0–34.0)
MCHC: 30.2 g/dL (ref 30.0–36.0)
Platelets: 189 10*3/uL (ref 150–400)

## 2010-07-09 NOTE — Assessment & Plan Note (Signed)
Summary: appt at 12noon- not feeling well/bmw   Vital Signs:  Patient profile:   77 year old female Height:      64 inches Temp:     98.3 degrees F oral Pulse rate:   68 / minute Resp:     14 per minute BP sitting:   90 / 56  (left arm)  Vitals Entered By: Willy Eddy, LPN (July 01, 2010 12:42 PM) CC: not feeling well- daughter states she has had prlbems with foot and ankle and was given pain med-vidcodan and she feels like that is why she feels bad- daughter states she stopped it about q week ago Is Patient Diabetic? Yes Did you bring your meter with you today? No   Contraindications/Deferment of Procedures/Staging:    Test/Procedure: Weight Refused    Reason for deferment: patient declined-cannot calculate BMI   Primary Care Provider:  Stacie Glaze MD  CC:  not feeling well- daughter states she has had prlbems with foot and ankle and was given pain med-vidcodan and she feels like that is why she feels bad- daughter states she stopped it about q week ago.  History of Present Illness:  the patient is a 77 year old African American female with a history of renal failure who presents with a one-month history of significant decline in functioning hasn't had falls dizziness and failure to thrive. Patient was evaluated by her renal doctors and was found to have a hemoglobin of 9.1 hematocrit of 27.3 we'll with a iron level of 19 and a ferritin of 802 suggesting that there may be blood loss anemia he is currently not on any ulcer prophylaxis nor is she on any iron supplementation although the daughter informs me that the dialysis center has stated that they will begin giving her iron her IV after her dialysis.   She has constipation and states that she is having to take extra laxatives she is somnolent and sleeps very he I do not see an ammonia level on her labs from dialysis today we will obtain an ammonia level add a B12 and iron level to confirm deficiency begin her on  ferrisequels as a supplement  Preventive Screening-Counseling & Management  Alcohol-Tobacco     Smoking Status: never     Tobacco Counseling: not indicated; no tobacco use  Problems Prior to Update: 1)  Anemia, Secondary To Chronic Blood Loss  (ICD-280.0) 2)  Anemia, Iron Deficiency, Chronic  (ICD-280.9) 3)  Unspecified Visual Loss  (ICD-369.9) 4)  Hypotension, Orthostatic  (ICD-458.0) 5)  Degenerative Joint Disease, Advanced  (ICD-715.90) 6)  Memory Loss  (ICD-780.93) 7)  Hyperlipidemia, Mild  (ICD-272.4) 8)  End Stage Renal Disease  (ICD-585.6) 9)  Osteoarthritis  (ICD-715.90) 10)  Hypertension  (ICD-401.9) 11)  Gerd  (ICD-530.81) 12)  Diabetes Mellitus, Type II  (ICD-250.00)  Current Problems (verified): 1)  Unspecified Visual Loss  (ICD-369.9) 2)  Hypotension, Orthostatic  (ICD-458.0) 3)  Degenerative Joint Disease, Advanced  (ICD-715.90) 4)  Memory Loss  (ICD-780.93) 5)  Hyperlipidemia, Mild  (ICD-272.4) 6)  End Stage Renal Disease  (ICD-585.6) 7)  Osteoarthritis  (ICD-715.90) 8)  Hypertension  (ICD-401.9) 9)  Gerd  (ICD-530.81) 10)  Diabetes Mellitus, Type II  (ICD-250.00)  Medications Prior to Update: 1)  Dialyvite 3000 3 Mg  Tabs (B Complex-C-Biotin-E-Min-Fa) .... Once Daily 2)  Renagel 800 Mg  Tabs (Sevelamer Hcl) .... Once Daily 3)  Reglan 5 Mg  Tabs (Metoclopramide Hcl) .... One By Mouth Q Ac 4)  Actos 45  Mg  Tabs (Pioglitazone Hcl) .... Once Daily 5)  Dexilant 60 Mg Cpdr (Dexlansoprazole) .... One By Mouth Dialy 6)  Lantus 100 Unit/ml  Soln (Insulin Glargine) .... 9 Units At Bedtime 7)  Cosopt 2-0.5 %  Soln (Dorzolamide-Timolol) .... Once Daily 8)  Qualaquin 324 Mg  Caps (Quinine Sulfate) .... As Needed Leg Cramps 9)  Tylenol Extra Strength 1000 Mg/8ml  Liqd (Acetaminophen) .... As Needed 10)  Ultram 50 Mg  Tabs (Tramadol Hcl) .... One By Mouth Three Times A Day As Needed For Pain 11)  Xyzal 5 Mg  Tabs (Levocetirizine Dihydrochloride) .... 1/2 Once Daily 12)   Lexapro 10 Mg Tabs (Escitalopram Oxalate) .... 1/2 By Mouth Daily 13)  Zofran 4 Mg Tabs (Ondansetron Hcl) .... One By Mouth Q 4-6 Hrs As Needed Nausea and Vomiting  Current Medications (verified): 1)  Dialyvite 3000 3 Mg  Tabs (B Complex-C-Biotin-E-Min-Fa) .... Once Daily 2)  Renagel 800 Mg  Tabs (Sevelamer Hcl) .... Once Daily 3)  Actos 45 Mg  Tabs (Pioglitazone Hcl) .... Once Daily 4)  Lantus 100 Unit/ml  Soln (Insulin Glargine) .... 9 Units At Bedtime 5)  Cosopt 2-0.5 %  Soln (Dorzolamide-Timolol) .... Once Daily 6)  Qualaquin 324 Mg  Caps (Quinine Sulfate) .... As Needed Leg Cramps 7)  Tylenol Extra Strength 1000 Mg/48ml  Liqd (Acetaminophen) .... As Needed 8)  Morphine Sulfate 15 Mg Tabs (Morphine Sulfate) .... 1/2 To One Tap Every 6 Hours As Needed For Pain 9)  Xyzal 5 Mg  Tabs (Levocetirizine Dihydrochloride) .... 1/2 Once Daily 10)  Lexapro 10 Mg Tabs (Escitalopram Oxalate) .... 1/2 By Mouth Daily 11)  Zofran 4 Mg Tabs (Ondansetron Hcl) .... One By Mouth Q 4-6 Hrs As Needed Nausea and Vomiting 12)  Ferrogels Forte 460-60-0.01-1 Mg Caps (Fe Fum-Vit C-Vit B12-Fa) .... One By Mouth Daily 13)  Nexium 40 Mg Cpdr (Esomeprazole Magnesium) .... One By Mouth Daily  Allergies (verified): No Known Drug Allergies  Past History:  Social History: Last updated: 01/12/2007 Retired Married Never Smoked Alcohol use-no Drug use-no  Risk Factors: Exercise: yes (02/25/2007)  Risk Factors: Smoking Status: never (07/01/2010)  Past medical, surgical, family and social histories (including risk factors) reviewed, and no changes noted (except as noted below).  Past Medical History: Reviewed history from 03/08/2009 and no changes required. Cough Diabetes mellitus, type II GERD Hypertension Bronchitis Osteoarthritis end stage renal dz  Past Surgical History: Reviewed history from 02/25/2007 and no changes required. Cataract extraction Colonoscopy dialysis access  Family  History: Reviewed history and no changes required.  Social History: Reviewed history from 01/12/2007 and no changes required. Retired Married Never Smoked Alcohol use-no Drug use-no  Review of Systems       The patient complains of anorexia, weight loss, hoarseness, dyspnea on exertion, and peripheral edema.  The patient denies fever, weight gain, vision loss, decreased hearing, chest pain, syncope, prolonged cough, headaches, hemoptysis, abdominal pain, melena, hematochezia, severe indigestion/heartburn, hematuria, incontinence, genital sores, muscle weakness, suspicious skin lesions, transient blindness, difficulty walking, depression, unusual weight change, abnormal bleeding, enlarged lymph nodes, angioedema, and breast masses.         diffuse weakness  Physical Exam  General:  pale and uncomfortable-appearing.   Head:  normocephalic and atraumatic.   Eyes:  pupils equal and pupils round.   Ears:  R ear normal and L ear normal.   Nose:  no external deformity and no external erythema.   Lungs:  normal respiratory effort and no dullness.  Heart:  normal rate and regular rhythm.   Abdomen:  soft, non-tender, and normal bowel sounds.   Msk:  decreased ROM and joint tenderness.   Extremities:  1+ left pedal edema and 1+ right pedal edema.   Neurologic:  alert & oriented X3 and finger-to-nose normal.     Impression & Recommendations:  Problem # 1:  ANEMIA, IRON DEFICIENCY, CHRONIC (ICD-280.9) Assessment Deteriorated   aggressive iron deficiency anemia we will measure and iron level as well as a B12 level today we'll begin the patient on iron supplementation and ulcer prophylaxis as well as give stool cards to make sure that she does not have a GI loss source of her iron depletion  Her updated medication list for this problem includes:    Ferrogels Forte 460-60-0.01-1 Mg Caps (Fe fum-vit c-vit b12-fa) ..... One by mouth daily  Hgb: 12.4 (11/13/2009)   Hct: 37.3 (11/13/2009)    Platelets: 147.0 (11/13/2009) RBC: 4.09 (11/13/2009)   RDW: 20.8 (11/13/2009)   WBC: 5.1 (11/13/2009) MCV: 91.1 (11/13/2009)   MCHC: 33.3 (11/13/2009) Iron: 50 (11/13/2009)   % Sat: 22.1 (11/13/2009) B12: >1500 pg/mL (11/13/2009)   Folate: >20.0 ng/mL (11/13/2009)   TSH: 1.43 (11/13/2009)  Problem # 2:  ANEMIA, SECONDARY TO CHRONIC BLOOD LOSS (ICD-280.0) Assessment: Deteriorated  Her updated medication list for this problem includes:    Ferrogels Forte 460-60-0.01-1 Mg Caps (Fe fum-vit c-vit b12-fa) ..... One by mouth daily  Problem # 3:  END STAGE RENAL DISEASE (ICD-585.6) Assessment: Unchanged reviewed dialysis labs she ha significant decreased  Labs Reviewed: BUN: 30 (04/16/2010)   Cr: 6.8 (04/16/2010)    Hgb: 12.4 (11/13/2009)   Hct: 37.3 (11/13/2009)   Ca++: 9.5 (04/16/2010)    TP: 8.0 (04/21/2006)   Alb: 3.6 (04/21/2006)  Problem # 4:  HYPERTENSION (ICD-401.9) Assessment: Unchanged  progressive  BP today: 90/56 Prior BP: 104/60 (04/16/2010)  Prior 10 Yr Risk Heart Disease: Not enough information (09/07/2008)  Labs Reviewed: K+: 3.6 (04/16/2010) Creat: : 6.8 (04/16/2010)   Chol: 165 (01/11/2009)   HDL: 45.30 (01/11/2009)     Problem # 5:  GERD (ICD-530.81)  The following medications were removed from the medication list:    Dexilant 60 Mg Cpdr (Dexlansoprazole) ..... One by mouth dialy Her updated medication list for this problem includes:    Nexium 40 Mg Cpdr (Esomeprazole magnesium) ..... One by mouth daily  Labs Reviewed: Hgb: 12.4 (11/13/2009)   Hct: 37.3 (11/13/2009)  Complete Medication List: 1)  Dialyvite 3000 3 Mg Tabs (B complex-c-biotin-e-min-fa) .... Once daily 2)  Renagel 800 Mg Tabs (Sevelamer hcl) .... Once daily 3)  Actos 45 Mg Tabs (Pioglitazone hcl) .... Once daily 4)  Lantus 100 Unit/ml Soln (Insulin glargine) .... 9 units at bedtime 5)  Cosopt 2-0.5 % Soln (Dorzolamide-timolol) .... Once daily 6)  Qualaquin 324 Mg Caps (Quinine sulfate) ....  As needed leg cramps 7)  Tylenol Extra Strength 1000 Mg/72ml Liqd (Acetaminophen) .... As needed 8)  Morphine Sulfate 15 Mg Tabs (Morphine sulfate) .... 1/2 to one tap every 6 hours as needed for pain 9)  Xyzal 5 Mg Tabs (Levocetirizine dihydrochloride) .... 1/2 once daily 10)  Lexapro 10 Mg Tabs (Escitalopram oxalate) .... 1/2 by mouth daily 11)  Zofran 4 Mg Tabs (Ondansetron hcl) .... One by mouth q 4-6 hrs as needed nausea and vomiting 12)  Ferrogels Forte 460-60-0.01-1 Mg Caps (Fe fum-vit c-vit b12-fa) .... One by mouth daily 13)  Nexium 40 Mg Cpdr (Esomeprazole magnesium) .... One by mouth  daily  Other Orders: Home Health Referral (Home Health)  Patient Instructions: 1)  KEEP APPOINTMENT Prescriptions: MORPHINE SULFATE 15 MG TABS (MORPHINE SULFATE) 1/2 TO ONE TAP EVERY 6 HOURS AS NEEDED FOR PAIN  #100 x 0   Entered and Authorized by:   Stacie Glaze MD   Signed by:   Stacie Glaze MD on 07/01/2010   Method used:   Print then Give to Patient   RxID:   [id number] NEXIUM 40 MG CPDR (ESOMEPRAZOLE MAGNESIUM) one by mouth daily  #30 x 11   Entered and Authorized by:   Stacie Glaze MD   Signed by:   Stacie Glaze MD on 07/01/2010   Method used:   Electronically to        [facility]* (retail)       [address]       [place], [place]  [id number]       Ph: [phone]       Fax: [phone]   RxID:   [id number] FERROGELS FORTE 460-60-0.01-1 MG CAPS (FE FUM-VIT C-VIT B12-FA) one by mouth daily  #30 x 11   Entered and Authorized by:   Stacie Glaze MD   Signed by:   Stacie Glaze MD on 07/01/2010   Method used:   Electronically to        [facility]* (retail)       [address]       [place], [place]  [id number]       Ph: [phone]       Fax: [phone]   RxID:   [id number]    Orders Added: 1)  Est. Patient Level IV [62376] 2)  Home Health Referral [facility]]

## 2010-07-10 ENCOUNTER — Inpatient Hospital Stay (HOSPITAL_COMMUNITY): Payer: Medicare Other

## 2010-07-10 LAB — ALBUMIN: Albumin: 2.4 g/dL — ABNORMAL LOW (ref 3.5–5.2)

## 2010-07-10 LAB — PREPARE RBC (CROSSMATCH)

## 2010-07-10 LAB — BASIC METABOLIC PANEL
Calcium: 9.8 mg/dL (ref 8.4–10.5)
Creatinine, Ser: 6.92 mg/dL — ABNORMAL HIGH (ref 0.4–1.2)
GFR calc Af Amer: 7 mL/min — ABNORMAL LOW (ref >60)
GFR calc non Af Amer: 6 mL/min — ABNORMAL LOW (ref >60)
Sodium: 139 mEq/L (ref 135–145)

## 2010-07-10 LAB — GLUCOSE, CAPILLARY: Glucose-Capillary: 155 mg/dL — ABNORMAL HIGH (ref 70–99)

## 2010-07-10 LAB — CBC
Hemoglobin: 7.3 g/dL — ABNORMAL LOW (ref 12.0–15.0)
MCH: 26.6 pg (ref 26.0–34.0)
MCHC: 30.7 g/dL (ref 30.0–36.0)
Platelets: 198 10*3/uL (ref 150–400)
RDW: 18.6 % — ABNORMAL HIGH (ref 11.5–15.5)

## 2010-07-10 LAB — PHOSPHORUS: Phosphorus: 2.6 mg/dL (ref 2.3–4.6)

## 2010-07-11 LAB — CULTURE, BLOOD (ROUTINE X 2)
Culture  Setup Time: 201202040531
Culture  Setup Time: 201202041708
Culture  Setup Time: 201202041708
Culture: NO GROWTH
Culture: NO GROWTH
Culture: NO GROWTH

## 2010-07-11 LAB — TYPE AND SCREEN
ABO/RH(D): A POS
Unit division: 0

## 2010-07-11 LAB — BASIC METABOLIC PANEL
BUN: 14 mg/dL (ref 6–23)
Chloride: 100 mEq/L (ref 96–112)
GFR calc non Af Amer: 11 mL/min — ABNORMAL LOW (ref >60)
Glucose, Bld: 160 mg/dL — ABNORMAL HIGH (ref 70–99)
Potassium: 3.7 mEq/L (ref 3.5–5.1)
Sodium: 138 mEq/L (ref 135–145)

## 2010-07-11 LAB — CBC
HCT: 32.6 % — ABNORMAL LOW (ref 36.0–46.0)
MCV: 86.2 fL (ref 78.0–100.0)
Platelets: 185 10*3/uL (ref 150–400)
RBC: 3.78 MIL/uL — ABNORMAL LOW (ref 3.87–5.11)
RDW: 17.6 % — ABNORMAL HIGH (ref 11.5–15.5)
WBC: 8.8 10*3/uL (ref 4.0–10.5)

## 2010-07-11 LAB — GLUCOSE, CAPILLARY
Glucose-Capillary: 146 mg/dL — ABNORMAL HIGH (ref 70–99)
Glucose-Capillary: 207 mg/dL — ABNORMAL HIGH (ref 70–99)

## 2010-07-11 LAB — HEMOCCULT GUIAC POC 1CARD (OFFICE): Fecal Occult Bld: NEGATIVE

## 2010-07-12 ENCOUNTER — Inpatient Hospital Stay (HOSPITAL_COMMUNITY): Payer: Medicare Other

## 2010-07-12 LAB — GLUCOSE, CAPILLARY: Glucose-Capillary: 129 mg/dL — ABNORMAL HIGH (ref 70–99)

## 2010-07-12 LAB — CBC
HCT: 30.8 % — ABNORMAL LOW (ref 36.0–46.0)
Hemoglobin: 10.3 g/dL — ABNORMAL LOW (ref 12.0–15.0)
Hemoglobin: 9.9 g/dL — ABNORMAL LOW (ref 12.0–15.0)
MCH: 27.8 pg (ref 26.0–34.0)
MCH: 28 pg (ref 26.0–34.0)
MCHC: 32 g/dL (ref 30.0–36.0)
MCHC: 32.1 g/dL (ref 30.0–36.0)
MCV: 87 fL (ref 78.0–100.0)
MCV: 87 fL (ref 78.0–100.0)
RBC: 3.7 MIL/uL — ABNORMAL LOW (ref 3.87–5.11)

## 2010-07-12 LAB — RENAL FUNCTION PANEL
BUN: 26 mg/dL — ABNORMAL HIGH (ref 6–23)
CO2: 29 mEq/L (ref 19–32)
Calcium: 9.8 mg/dL (ref 8.4–10.5)
Creatinine, Ser: 5.97 mg/dL — ABNORMAL HIGH (ref 0.4–1.2)
Glucose, Bld: 157 mg/dL — ABNORMAL HIGH (ref 70–99)
Sodium: 137 mEq/L (ref 135–145)

## 2010-07-12 LAB — BASIC METABOLIC PANEL
BUN: 22 mg/dL (ref 6–23)
CO2: 30 mEq/L (ref 19–32)
Calcium: 10.1 mg/dL (ref 8.4–10.5)
Chloride: 99 mEq/L (ref 96–112)
Creatinine, Ser: 5.41 mg/dL — ABNORMAL HIGH (ref 0.4–1.2)

## 2010-07-13 NOTE — H&P (Signed)
NAMESEANNE, [name] NO.:  [id number]  MEDICAL RECORD NO.:  [id number]           PATIENT TYPE:  E  LOCATION:  [facility]                         FACILITY:  [facility]  PHYSICIAN:  Michiel Cowboy, MDDATE OF BIRTH:  [birth year]  DATE OF ADMISSION:  07/04/2010 DATE OF DISCHARGE:                             HISTORY & PHYSICAL   ATTENDING PHYSICIAN:  Michiel Cowboy, MD  PRIMARY CARE PROVIDER:  Stacie Glaze, MD  CHIEF COMPLAINT:  Overall weakness, nausea, and vomiting.  The patient is a 77 year old female with history of end-stage renal disease, diabetes, and gastroparesis.  Per her family, ever since the beginning of year, patient have not been doing well.  She had suffered a fall in the beginning of a year, resulting in bilateral sprains.  She had been progressively feeling weaker unwell.  Recently, she started to develop nausea and vomiting.  She can hardly keep any of her foods down. Did not have any fevers or chills or chest pains or shortness of breath. Her blood pressure usually runs in high 90s.  But initially when she presented to emergency department, blood pressure was in 70s, which is low for her.  Given her overall debility, her family brought into the emergency department for further evaluation at which point [facility] was called for an admission.  REVIEW OF SYSTEMS:  Otherwise negative.  PAST MEDICAL HISTORY: 1. Consistent with end-stage renal disease on hemodialysis on     Tuesdays, Thursdays, and Saturdays.  She has a right arm graft     placed. 2. Hyperlipidemia. 3. Gastroparesis. 4. Hypertension. 5. GERD. 6. Type 2 diabetes. 7. Osteoarthritis. 8. Right arm pain and numbness. 9. Intermittent hypertension, although on the past, she has     hypertension.  SOCIAL HISTORY:  The patient does not smoke or drink or abuse drugs.  FAMILY HISTORY:  Noncontributory.  ALLERGIES:  None.  MEDICATIONS: 1. Renagel 600 mg t.i.d. 2.  Simvastatin 80 mg daily. 3. Actos 45 mg daily. 4. Nexium 40 mg daily. 5. Lexapro 10 mg daily. 6. Iron. 7. Aspirin 81 mg daily. 8. Qualaquin 324 mg daily as needed for restless legs. 9. Morphine p.o. CR 7.5 mg, she can take it after every 6 hours as     needed. 10.Lantus 9 units at bedtime. 11.Cosopt eyedrops.  PHYSICAL EXAMINATION:  VITAL SIGNS:  Temperature 98.2, blood pressure initially 74/79 up to 94/51, pulse 79, respirations 18, satting 99% on room air.  GENERAL:  The patient appears to be in no acute distress. HEENT:  Head nontraumatic.  Somewhat dryish mucous membranes. LUNGS:  Clear to auscultation bilaterally. HEART:  Regular rhythm.  No murmurs appreciated. ABDOMEN:  Obese but nontender, nondistended. EXTREMITIES: Lower extremities without clubbing, cyanosis, or edema, but there is a fracture boot on her right lower extremity and a splint on her left lower extremity. NEUROLOGICALLY:  Strength 5/5 in all four extremities.  Cranial nerves appear to be intact.  LABORATORY DATA:  White blood cell count 7.6, hemoglobin 9.1, sodium 138, potassium 3.3, creatinine 5.26, albumin 3.0, lactic acid 1.4, lipase 22.  Cardiac enzymes unremarkable.  Chest  x-ray is showing stable cardiomegaly.  EKG showing no ST changes, no ischemic changes.  ASSESSMENT AND PLAN:  This is a 77 year old female with generalized fatigue, nausea, and vomiting likely secondary to her history of gastroparesis and hypotension. 1. Nausea, vomiting.  We will treat symptomatically with Zofran and     Reglan, try to get a KUB. 2. Hypertension.  We will monitor in the step-down, give gentle IV     fluids, given history of end-stage renal disease. 3. Diabetes.  We will put on sliding scale, hold this for now as her     blood sugars are little bit on the lower side. 4. End-stage renal disease.  We will attempt to get a hold of Renal to     discuss patient with them. 5. Prophylaxis.  Protonix and sequential  compression devices. 6. Code status.  Full code.     Michiel Cowboy, MD     AVD/MEDQ  D:  07/04/2010  T:  07/04/2010  Job:  [id number]  cc:   Stacie Glaze, MD  Electronically Signed by Therisa Doyne MD on 07/12/2010 07:26:56 PM

## 2010-07-13 NOTE — Discharge Summary (Signed)
[name], [name] NO.:  [id number]  MEDICAL RECORD NO.:  [id number]           PATIENT TYPE:  LOCATION:                                 FACILITY:  PHYSICIAN:  Andreas Blower, MD       DATE OF BIRTH:  29-Aug-1928  DATE OF ADMISSION: DATE OF DISCHARGE:                              DISCHARGE SUMMARY   PRIMARY CARE PHYSICIAN:  Stacie Glaze, MD  NEPHROLOGIST:  [facility].  DISCHARGE DIAGNOSES: 1. Sepsis secondary to methicillin-susceptible Staphylococcus aureus     urinary tract infection. 2. Acute delirium in the setting of possible progressive mild     dementia. 3. End-stage renal disease, on hemodialysis. 4. Hypotension secondary to sepsis, resolved. 5. Anemia. 6. History of gastroparesis secondary to diabetes. 7. Diabetes. 8. 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 9. History of gastroesophageal reflux disease. 10.Hypertension. 11.Hyperlipidemia. 12.Osteoarthritis. 13.Chronic right arm pain and numbness. 14.Dysphagia.  DISCHARGE MEDICATIONS: 1. Darbepoetin 100 mcg subcu every Tuesday with hemodialysis. 2. Iron sucrose 50 mg IV every Tuesday with hemodialysis. 3. Atorvastatin 40 mg p.o. q.p.m. 4. Levofloxacin 250 mg every other day for 3 more days. 5. Nutritional supplement 237 mL p.o. 3 times a day as needed. 6. Zemplar 6 mcg IV on Tuesday, Thursday, and Saturday. 7. Lantus dose decreased to 5 mg subcu daily at bedtime. 8. Aspirin 81 mg p.o. daily. 9. Cosopt ophthalmic solution 2/0.5% 1 drop in both eyes twice daily. 10.Daily vitamin 1 tablet p.o. daily. 11.FerroGels Forte 1 capsule p.o. daily. 12.Lexapro 5 mg p.o. daily. 13.Morphine sulfate 7.5-15 mg p.o. q.6 h. daily as needed for pain. 14.Nexium 40 mg p.o. daily. 15.Quinine 324 mg p.o. before dialysis. 16.Tylenol 1000 mg every 6 hours as needed for pain.  Following medications were discontinued during the course hospital stay: 1. Pioglitazone 45 mg p.o. daily. 2.  Renagel 1600 mg 3 times a day before meals. 3. Simvastatin 80 mg p.o. daily at bedtime.  BRIEF ADMITTING HISTORY AND PHYSICAL:  Ms. Hayworth is a 77 year old African American female with end-stage renal disease, diabetes, and gastroparesis who presented on July 04, 2010, with overall weakness, nausea, and vomiting.  RADIOLOGY/IMAGING:  The patient had a portable chest x-ray on July 04, 2010, which showed stable cardiomegaly with central venous line and no active process.  The patient had an abdominal x-ray which shows nonobstructive bowel gas pattern.  Moderate desiccated stools within the colon.  The patient had a renal ultrasound on July 06, 2010, which showed atrophic bilateral kidneys without evidence of hydronephrosis.  Lobular contour abnormality along the cortex of the left kidney.  Rather than representing no focal mass, this most likely represents fetal lobulation.  The patient had a head CT without contrast on July 08, 2010, which showed calcified mass inferiorly in the right orbit.  The patient will need a dedicated orbital MRI without contrast if clinically warranted. Remote lacunar infarctions of the left caudate head and right globus pallidus.  Periventricular and corona radiata white matter hyperdensities are most compatible with chronic ischemic microvascular white matter disease.  LABORATORY DATA:  CBC shows  white count of 8.5, hemoglobin 10.3, hematocrit 32.2, and platelet count 194.  Initial hemoglobin at presentation was 9.1, trended down to 7.3, received 2 units of PRBC. Electrolytes normal with BUN of 22 and creatinine of 5.41.  Liver function tests normal except AST was 41, albumin is 2.7.  Blood cultures x2 on February 3rd, 4th, and 7th showed no growth to date.  Urine culture grew staph aureus, was sensitive to oxacillin.  It was sensitive to levofloxacin.  UA on admission was positive for nitrites, moderate leukocytes, and many bacteria.   Hemoglobin A1c on presentation was 5.8. Troponin was negative x3.  It was planned half NS and involving the second LDL was 49.  DIET:  The patient will be discharged home on dysphagia 3 diet with renal and diabetic diet, with thin liquids.  HOSPITAL COURSE BY PROBLEM: 1. Sepsis secondary to MSSA urinary tract infection:  Initially, the     patient was empirically started on carbapenem class drugs.  Renal     ultrasound was obtained and it did not show any pyelonephritis.     Based on the urine sensitivities, her carbapenem was changed to     levofloxacin.  The patient will be on antibiotics for 3 more days     to complete a 10-day course of antibiotics. 2. Acute hypotension from sepsis, resolved during the course hospital     stay. 3. Acute delirium in the setting of possible progressive mild     dementia.  Head CT was obtained.  Initially, the patient was     confused and required restraints and administration of Haldol.     During the course hospital stay, the patient's mentation improved.     Head CT was obtained which showed a calcified mass inferiorly in     the right orbit.  We will defer to the patient's primary care     physician whether a dedicated orbital MRI should be done without     contrast if clinically warranted.  Contrast should not be     administered as the patient has end-stage renal disease. 4. End-stage renal disease, on hemodialysis on Tuesday, Thursday, and     Saturday. 5. Anemia.  During the course of hospital stay, the patient had a drop     in her hemoglobin and received 2 units of PRBC on July 10, 2010.     Fecal occult was negative. 6. History of gastroparesis.  This was stable during the course of the     hospital stay. 7. Diabetes.  Her Actos was discontinued during the course of the     hospital stay.  The patient's Lantus was decreased from 9-5 units.     Further titration of her diabetic medications to be managed by her     primary care  physician as outpatient. 8. Calcified mass in the orbit.  This was an incidental finding noted     on head CT.  We will defer to the patient's primary care physician     whether dedicated orbital MRI should be performed without contrast     if clinically warranted.  DISPOSITION AND FOLLOWUP:  The patient is to follow up with her primary care physician in 1 week.  The patient was given instructions about the need for MRI of the orbits to be done by her primary care physician if warranted at the time of discharge.  The patient will be arranged for home health PT, OT, and speech therapy.  Time spent  on discharge talking to the patient and coordinating care was 40 minutes.     Andreas Blower, MD     SR/MEDQ  D:  07/12/2010  T:  07/13/2010  Job:  [id number]  cc:   Stacie Glaze, MD  Electronically Signed by Wardell Heath Aidynn Krenn  on 07/13/2010 04:53:07 PM

## 2010-07-14 LAB — CULTURE, BLOOD (ROUTINE X 2)
Culture  Setup Time: 201202071656
Culture: NO GROWTH

## 2010-07-17 ENCOUNTER — Other Ambulatory Visit: Payer: Self-pay | Admitting: Nephrology

## 2010-07-17 DIAGNOSIS — R52 Pain, unspecified: Secondary | ICD-10-CM

## 2010-07-22 ENCOUNTER — Other Ambulatory Visit: Payer: Self-pay | Admitting: Nephrology

## 2010-07-22 DIAGNOSIS — R930 Abnormal findings on diagnostic imaging of skull and head, not elsewhere classified: Secondary | ICD-10-CM

## 2010-07-23 ENCOUNTER — Ambulatory Visit
Admission: RE | Admit: 2010-07-23 | Discharge: 2010-07-23 | Disposition: A | Discharge status: Home / Self Care | Payer: Medicare Other | Source: Ambulatory Visit | Attending: Nephrology | Admitting: Nephrology

## 2010-07-23 DIAGNOSIS — R930 Abnormal findings on diagnostic imaging of skull and head, not elsewhere classified: Secondary | ICD-10-CM

## 2010-07-29 ENCOUNTER — Encounter: Payer: Self-pay | Admitting: Internal Medicine

## 2010-07-30 ENCOUNTER — Encounter: Payer: Self-pay | Admitting: Internal Medicine

## 2010-07-30 ENCOUNTER — Ambulatory Visit: Payer: Medicare Other | Admitting: Internal Medicine

## 2010-07-31 ENCOUNTER — Telehealth: Payer: Self-pay | Admitting: Internal Medicine

## 2010-07-31 NOTE — Telephone Encounter (Signed)
Advise

## 2010-07-31 NOTE — Telephone Encounter (Signed)
[facility] called re: order for in and out cath. We do not do caths at our facility. Pt had cath done last wk by a home health nurse and there was no urine.

## 2010-07-31 NOTE — Telephone Encounter (Signed)
Noted  

## 2010-08-11 NOTE — Progress Notes (Signed)
NAMEKWANA, RINGEL NO.:  [id number]  MEDICAL RECORD NO.:  [id number]           PATIENT TYPE:  I  LOCATION:  2917                         FACILITY:  [facility]  PHYSICIAN:  Isidor Holts, M.D.  DATE OF BIRTH:  03/11/1929                                PROGRESS NOTE   PRIMARY CARE PHYSICIAN: Stacie Glaze, MD  CHIEF COMPLAINT/REASON FOR ADMISSION: Ms. Joynt is a 77 year old female patient with chronic kidney disease on dialysis as well as diabetes with gastroparesis.  She presented with generalized weakness and nausea and vomiting.  She has had previous admissions related to exacerbation of gastroparesis manifested by nausea and vomiting.  She has had some progressive decline in health and deconditioning since January 2012 beginning with a fall with bilateral extremity sprains.  Since that time, she has developed progressive malaise associated with nausea and vomiting.  No fevers, chills, chest pains, or shortness of breath.  Her normal blood pressure is between 90- 110 systolic.  When she presented to the emergency department her systolic blood pressure was in the 70s..  Initial clinical exam revealed a patient that was not in any acute distress.  Temperature was 98.2.  BP 74/79 and after fluid challenge increased to 94/51, heart rate 79, respirations 18, O2 saturations 99% on room air.  Her physical exam was otherwise unremarkable except for oral mucous membranes were dry and there was a fracture boot on the right lower extremity and a splint on her left lower extremity.  Her white count was 7600, hemoglobin 9.1, sodium 138, potassium 3.3, creatinine 5.26, albumin 3, lactic acid 1.4, lipase 22.  Cardiac isoenzymes in the ER were negative.  Chest x-ray showed stable cardiomegaly.  EKG showed sinus tachycardia without ischemic changes.  PAST MEDICAL HISTORY: 1. Chronic kidney disease on dialysis Tuesday, Thursday, Saturday. 2. Dyslipidemia. 3. Diabetes  with gastroparesis. 4. Hypertension. 5. Reflux. 6. Osteoarthritis. 7. Chronic right arm pain and numbness. 8. Intermittent hypertension although baseline blood pressure from a     systolic standpoint is normally 90-100.  ADMITTING DIAGNOSES: 1. Generalized malaise with nausea and vomiting, etiology unclear. 2. Hypotension without evidence of sepsis. 3. Diabetes 2 with gastroparesis. 4. Chronic kidney disease, on dialysis.  DIAGNOSTICS: 1. Portable chest x-ray, February 3, shows stable cardiomegaly with     central venous line in appropriate placement, no acute process. 2. A single-view abdominal film, February 3, which showed a     nonobstructive bowel gas pattern.  There was also moderate     desiccated stool within the colon consistent with probable     constipation. 3. Renal ultrasound, February 5, that demonstrated atrophic bilateral     kidneys without evidence of hydronephrosis.  A lobular contour     abnormality along the cortex of the left kidney.  This most likely     represents fetal lobulation rather than a focal mass.  LABORATORY DATA: Cardiac isoenzymes were cycled x3 and there was no evidence of acute ischemia.  MRSA PCR screening was positive.  Urine culture showed greater than 100,000 colonies of Staphylococcus aureus resistant to penicillin.  Blood cultures x4  bottles show no growth to date. Hemoglobin A1c 5.8.  Lipid profile; cholesterol 95, triglycerides 99, HDL 26, LDL 49.  PT 15.9, INR 1.25, PTT 34.  As of today, white count 9600, hemoglobin 8.5, hematocrit 28.4, platelets 199,000.  Sodium 136, potassium 4.6, chloride 99, CO2 of 24, glucose 135, BUN 35, creatinine 9.71, albumin 2.6, calcium 9.5, phosphorus 4.9.  HOSPITAL COURSE: 1. Sepsis secondary to staph urinary tract infection.  The patient     presented with generalized malaise without fevers or leukocytosis     as well as hypotension requiring fluid challenges.  After     admission, she later  developed fevers.  Subsequent urinalysis did     reveal a Staphylococcus UTI.  She had been placed empirically on     Primaxin at time of admission.  Renal ultrasound was obtained that     showed no evidence of pyelonephritis or renal abscess     and as of today and we are narrowing the Primaxin to Levaquin since     the MIC on this is 0.25.  As of today, her hypotension has     essentially resolved.  Her blood pressure as in her more average     range at 90-100 systolic. 2. Acute delirium in the setting of apparent progressive mild     dementia.  Rule out CVA.  The patient has had issues with     significant delirium and agitation requiring restraints and     administration of Haldol this admission.  Initially, this was     attributed to advanced age and acute infection.  I discussed with     Dr. Annie Sable who does not know the patient well, but she     did notify the dialysis center and had a conversation with them.     They were able to confirm that the patient's baseline mental status     is not consistent with a mental status we are observing here.     Although she is soft spoken, she is normally articulate in her     speech and is able to represent back to the physician's information     given to her.  She is unable to do this today.  She seems to have     some difficulty with word finding at times and following commands.     This could be attributed to the Haldol she has received but     concerns are she may have had an occult cerebrovascular event.  We     will check a CT of the head without contrast today.  If this is not     fully clarified she may need an MRI. 3. Chronic kidney disease, on hemodialysis.  Due to the patient's     presentation with a hypotensive state, her hemodialysis has been     placed on hold. Her blood pressure is back to her baseline and plans are to     proceed with hemodialysis this coming Tuesday February 7. 4. Diabetes 2 with gastroparesis.   The patient's hemoglobin A1c and     CBG are within appropriate parameters.  At the present time, we     will continue her sliding scale insulin until her p.o. intake     improves.  We will continue her Reglan and her Protonix to assist     with gastroparesis as well as her GERD symptoms.  DISPOSITION: At the present time, the patient is appropriate to  transfer to the renal unit 6700.  She has had no arrhythmias or other rhythm instability that would require her to utilize telemetry.     Allison L. Rennis Harding, N.P.   ______________________________ Isidor Holts, M.D.    ALE/MEDQ  D:  07/07/2010  T:  07/07/2010  Job:  [id number]  Electronically Signed by Junious Silk N.P. on 07/07/2010 02:16:13 PM Electronically Signed by Isidor Holts M.D. on 08/11/2010 11:49:07 AM

## 2010-08-14 ENCOUNTER — Other Ambulatory Visit: Payer: Self-pay | Admitting: *Deleted

## 2010-08-14 MED ORDER — TRAMADOL HCL 50 MG PO TABS: 50.0000 mg | ORAL_TABLET | Freq: Four times a day (QID) | ORAL | Status: AC | PRN

## 2010-08-19 LAB — DIFFERENTIAL
Basophils Absolute: 0 10*3/uL (ref 0.0–0.1)
Eosinophils Absolute: 0.1 10*3/uL (ref 0.0–0.7)
Eosinophils Relative: 3 % (ref 0–5)
Lymphocytes Relative: 18 % (ref 12–46)

## 2010-08-19 LAB — CBC
HCT: 28.8 % — ABNORMAL LOW (ref 36.0–46.0)
Hemoglobin: 11.5 g/dL — ABNORMAL LOW (ref 12.0–15.0)
MCHC: 32.8 g/dL (ref 30.0–36.0)
MCV: 91.1 fL (ref 78.0–100.0)
MCV: 91.8 fL (ref 78.0–100.0)
Platelets: 105 10*3/uL — ABNORMAL LOW (ref 150–400)
RBC: 3.71 MIL/uL — ABNORMAL LOW (ref 3.87–5.11)
RDW: 20.4 % — ABNORMAL HIGH (ref 11.5–15.5)
WBC: 5.8 10*3/uL (ref 4.0–10.5)

## 2010-08-19 LAB — COMPREHENSIVE METABOLIC PANEL
ALT: 8 U/L (ref 0–35)
AST: 91 U/L — ABNORMAL HIGH (ref 0–37)
CO2: 29 mEq/L (ref 19–32)
Chloride: 96 mEq/L (ref 96–112)
Creatinine, Ser: 5.01 mg/dL — ABNORMAL HIGH (ref 0.4–1.2)
GFR calc Af Amer: 10 mL/min — ABNORMAL LOW (ref >60)
GFR calc non Af Amer: 8 mL/min — ABNORMAL LOW (ref >60)
Glucose, Bld: 108 mg/dL — ABNORMAL HIGH (ref 70–99)
Total Bilirubin: 1.3 mg/dL — ABNORMAL HIGH (ref 0.3–1.2)

## 2010-08-19 LAB — GLUCOSE, CAPILLARY
Glucose-Capillary: 136 mg/dL — ABNORMAL HIGH (ref 70–99)
Glucose-Capillary: 91 mg/dL (ref 70–99)

## 2010-08-19 LAB — RENAL FUNCTION PANEL
BUN: 33 mg/dL — ABNORMAL HIGH (ref 6–23)
CO2: 29 mEq/L (ref 19–32)
Calcium: 8.3 mg/dL — ABNORMAL LOW (ref 8.4–10.5)
Calcium: 8.6 mg/dL (ref 8.4–10.5)
Creatinine, Ser: 8.33 mg/dL — ABNORMAL HIGH (ref 0.4–1.2)
GFR calc Af Amer: 7 mL/min — ABNORMAL LOW (ref >60)
GFR calc non Af Amer: 6 mL/min — ABNORMAL LOW (ref >60)
Glucose, Bld: 113 mg/dL — ABNORMAL HIGH (ref 70–99)
Phosphorus: 5.3 mg/dL — ABNORMAL HIGH (ref 2.3–4.6)
Potassium: 3.7 mEq/L (ref 3.5–5.1)
Sodium: 132 mEq/L — ABNORMAL LOW (ref 135–145)

## 2010-08-19 LAB — URINE MICROSCOPIC-ADD ON

## 2010-08-19 LAB — URINALYSIS, ROUTINE W REFLEX MICROSCOPIC
Glucose, UA: NEGATIVE mg/dL
Specific Gravity, Urine: 1.013 (ref 1.005–1.030)

## 2010-08-19 LAB — LIPASE, BLOOD: Lipase: 140 U/L — ABNORMAL HIGH (ref 11–59)

## 2010-08-19 LAB — HEMOGLOBIN A1C: Hgb A1c MFr Bld: 5.7 % — ABNORMAL HIGH (ref <5.7)

## 2010-08-19 LAB — TSH: TSH: 0.696 u[IU]/mL (ref 0.350–4.500)

## 2010-08-19 NOTE — Progress Notes (Signed)
NAMEVENORA, KAUTZMAN NO.:  [id number]  MEDICAL RECORD NO.:  [id number]           PATIENT TYPE:  I  LOCATION:  6707                         FACILITY:  [facility]  PHYSICIAN:  Homero Fellers, MD   DATE OF BIRTH:  [birth year]                                PROGRESS NOTE   SUBJECTIVE: The patient is having intermittent confusion.  There is no chest pain, shortness of breath, nausea, or vomiting.  She is not yet ambulating. In summary, she is a 77 year old woman with end-stage renal disease, on hemodialysis.  Last hemodialysis was yesterday, admitted for urinary tract infection and sepsis secondary to same, as well as acute delirium. The patient has no prior history of dementia.  She is currently on IV Levaquin.  PHYSICAL EXAMINATION: VITAL SIGNS:  Her blood pressure showed 130/61, pulse 90, respirations 20, temperature is 98.9, O2 sat was 99% on room air. GENERAL:  The patient is comfortable in no distress. HEENT:  PERRLA.  Extraocular muscles are intact. NECK:  Supple.  No JVD, adenopathy, or thyromegaly. LUNGS:  Clear to auscultation bilaterally.  No wheezing, no crackles. HEART:  S1 and S2.  No murmurs, rubs, or gallops. ABDOMEN:  Soft, nontender.  Bowel sounds present.  No masses. EXTREMITIES:  Trace edema. NEUROLOGIC:  No deficits.  LABORATORY DATA: Blood and urine culture is pending.  Both did not grow any organism for now probably because she had received previous antibiotics.  Potassium is 3.8, creatinine today is 5.32.  Liver enzymes are normal.  Hemoglobin is 7.7, white count is 8.4.  Please note, I ordered urine culture on July 05, 2010, grew staph aureus and she is methicillin-susceptible Staphylococcus aureus, sensitive to Levaquin also.  ASSESSMENT: 1. Sepsis secondary to urinary tract infection with urine culture on     this admission growing methicillin-susceptible Staphylococcus     aureus. 2. Hypotension, currently resolved. 3. End-stage  renal disease, on hemodialysis. 4. Diabetic gastroparesis. 5. Diabetes mellitus with fair control. 6. Anemia, likely anemia of kidney failure.  Hemoglobin is slightly     worsening.  She is appropriately on Aranesp.  Nephrologist is     following.  She might need transfusion if hemoglobin continues to     drop. 7. Acute delirium, likely related to sepsis.  The patient has no prior     history of underlying dementia.  He is going to get intermittent     Haldol for this, but for the most part the patient is manageable on     the floor.  As per the family, the patient should get better with     continued antibiotic treatment in 3 days. 8. History of calcified mass in the orbit, on the right side on CT of     the head.  MRI of the head to be done when the patient is more     clinically stable.  PLAN: Get physical therapy.  Follow CBC closely.  Consider transfusion if hemoglobin is getting worse.  Continue hemodialysis regimen.  DISPOSITION: Home with family once stable.     Homero Fellers, MD     FA/MEDQ  D:  07/09/2010  T:  07/10/2010  Job:  [id number]  Electronically Signed by Homero Fellers  on 08/19/2010 02:15:56 AM

## 2010-09-02 ENCOUNTER — Encounter: Payer: Self-pay | Admitting: Internal Medicine

## 2010-09-02 LAB — POCT I-STAT 4, (NA,K, GLUC, HGB,HCT)
HCT: 35 % — ABNORMAL LOW (ref 36.0–46.0)
Hemoglobin: 11.9 g/dL — ABNORMAL LOW (ref 12.0–15.0)
Sodium: 138 mEq/L (ref 135–145)

## 2010-09-02 LAB — GLUCOSE, CAPILLARY: Glucose-Capillary: 122 mg/dL — ABNORMAL HIGH (ref 70–99)

## 2010-09-04 LAB — GLUCOSE, CAPILLARY
Glucose-Capillary: 66 mg/dL — ABNORMAL LOW (ref 70–99)
Glucose-Capillary: 76 mg/dL (ref 70–99)
Glucose-Capillary: 107 mg/dL — ABNORMAL HIGH (ref 70–99)
Glucose-Capillary: 83 mg/dL (ref 70–99)
Glucose-Capillary: 86 mg/dL (ref 70–99)
Glucose-Capillary: 86 mg/dL (ref 70–99)

## 2010-09-04 LAB — POCT I-STAT 4, (NA,K, GLUC, HGB,HCT)
HCT: 38 % (ref 36.0–46.0)
HCT: 45 % (ref 36.0–46.0)
Sodium: 138 mEq/L (ref 135–145)

## 2010-09-22 ENCOUNTER — Ambulatory Visit: Payer: Self-pay | Admitting: Vascular Surgery

## 2010-09-29 ENCOUNTER — Encounter: Payer: Self-pay | Admitting: Internal Medicine

## 2010-09-29 ENCOUNTER — Ambulatory Visit (INDEPENDENT_AMBULATORY_CARE_PROVIDER_SITE_OTHER): Payer: Medicare Other | Admitting: Internal Medicine

## 2010-09-29 DIAGNOSIS — K219 Gastro-esophageal reflux disease without esophagitis: Secondary | ICD-10-CM

## 2010-09-29 DIAGNOSIS — I10 Essential (primary) hypertension: Secondary | ICD-10-CM

## 2010-09-29 DIAGNOSIS — N186 End stage renal disease: Secondary | ICD-10-CM

## 2010-09-29 DIAGNOSIS — E119 Type 2 diabetes mellitus without complications: Secondary | ICD-10-CM

## 2010-09-29 NOTE — Progress Notes (Signed)
  Subjective:    Patient ID: [name], female    DOB: 03-May-1929, 77 y.o.   MRN: [id number]  HPI [name] is a thin 77 year old female who presents for followup of diabetes hypertension and reflux.  She is followed by the renal team for dialysis because of end-stage renal disease she is followed by an endocrinologist for her diabetes and there's been a recent adjustment of her Lantus from 9 units down to 5 units 22 some hypoglycemia.  Her mental status has returned to normal she is active and oriented to person place time and situation. He continues with her dialysis on a regular basis.   Review of Systems  Constitutional: Positive for activity change.  HENT: Negative for ear pain, nosebleeds and tinnitus.   Eyes: Negative for pain and redness.  Respiratory: Negative.   Cardiovascular: Negative.   Gastrointestinal: Negative.   Genitourinary: Negative.   Musculoskeletal: Positive for joint swelling.  Neurological: Positive for weakness.   Past Medical History  Diagnosis Date  . Cough   . Diabetes mellitus     type 2  . GERD (gastroesophageal reflux disease)   . Hypertension   . Bronchitis   . Osteoarthritis   . End stage renal disease    Past Surgical History  Procedure Date  . Eye surgery     Cataract extraction    reports that she has never smoked. She does not have any smokeless tobacco history on file. She reports that she does not drink alcohol or use illicit drugs. family history includes Hypertension in her mother. No Known Allergies     Objective:   Physical Exam    Blood pressure 110/70, pulse 72, temperature 98.2 F (36.8 C), temperature source Oral, resp. rate 16, height 5\' 2"  (1.575 m), weight 152 lb (68.947 kg).  She is a pleasant female in no apparent distress her vital signs are stable her blood pressure is 110/70 pupils are equal round and reactive to light her neck is supple her lung fields were clear her heart examination revealed regular rate and  rhythm.  She is wearing her diabetic shoes she was oriented to date situation and called names.    Assessment & Plan:  I believe she has returned to her baseline mental status and her diabetes appears to be well-controlled with a recent A1c reported to be 5.0 her blood pressure is stable with some orthostatic hypotension in the morning after lying or sitting she has hemodialysis 3 times a week we will resolve the memory loss problem.

## 2010-09-30 ENCOUNTER — Other Ambulatory Visit: Payer: Self-pay | Admitting: Internal Medicine

## 2010-10-14 ENCOUNTER — Telehealth: Payer: Self-pay | Admitting: Internal Medicine

## 2010-10-14 NOTE — Telephone Encounter (Signed)
[name] told that if she needs back brace will send to ortho

## 2010-10-14 NOTE — Assessment & Plan Note (Signed)
OFFICE VISIT   [name], [name]  DOB:  Jun 28, 1928                                       04/08/2009  ZOXWR#:[id number]   REASON FOR VISIT:  New access.   HISTORY:  This is a 77 year old female that I am seeing at the request  of Dr. Hyman Hopes for evaluation of new access.  The patient has a history of  a left forearm and upper arm graft that are now all occluded.  She  dialyzes through a catheter which was placed by Dr. Arbie Cookey recently.  She  comes in today for evaluation of new access.  She is right-handed.   The patient suffers from hypertension which has been well controlled  with medications.  She does have episodes of hypotension during  dialysis.  She also suffers from hypercholesterolemia which is  controlled with medications.  She is diabetic.  Her blood sugars have  been in the 120 range.   REVIEW OF SYSTEMS:  No chest pain and no shortness of breath.   SOCIAL HISTORY:  She continues to be a nonsmoker and a nondrinker.   PAST MEDICAL HISTORY:  There have been no changes in her past medical  history.   PHYSICAL EXAMINATION:  Temperature is 98.0, blood pressure is 129/78,  pulse is 82.  General:  She is well-appearing, no distress.  HEENT:  Normocephalic, atraumatic.  Pupils equal.  Sclerae anicteric.  Lungs:  Respirations are nonlabored.  Cardiovascular:  Regular rate and rhythm.  Neurologic:  She is intact.  Skin:  Without rash.  Extremities:  She has  palpable right brachial and right radial pulse.   DIAGNOSTIC STUDIES:  Vein mapping was independently reviewed today.  She  does not have adequate cephalic vein for fistula.   ASSESSMENT/PLAN:  End-stage renal disease needing permanent access.   PLAN:  The patient be scheduled for a right forearm graft to be placed  this Friday, April 12, 2009.  I discussed the risks and benefits with  the patient.  All of her questions were answered.   Jorge Ny, MD  Electronically Signed   VWB/MEDQ  D:  04/08/2009  T:  04/09/2009  Job:  [id number]   cc:   Garnetta Buddy, M.D.

## 2010-10-14 NOTE — Telephone Encounter (Signed)
Rcvd partially completed form from doctor re: back brace for pt. Need dx code.

## 2010-10-14 NOTE — Procedures (Signed)
CEPHALIC VEIN MAPPING   INDICATION:  Preop graft placement.   HISTORY:  Diabetes, hypertension, end-stage renal disease, 2 failed left arm  grafts.   EXAM:  The right cephalic vein is compressible.   Diameter measurements range from 0.11 to 0.2.   The basilic vein was too small to visualize adequately.   See attached worksheet for all measurements.   IMPRESSION:  Patent's right cephalic vein which is not of acceptable  diameter for use as a dialysis access site.  The basilic vein was too  small to visualize adequately for measurements.   ___________________________________________  V. Charlena Cross, MD   CB/MEDQ  D:  04/08/2009  T:  04/09/2009  Job:  [id number]

## 2010-10-17 NOTE — Discharge Summary (Signed)
NAMEJAYNI, [name] NO.:  [id number]   MEDICAL RECORD NO.:  [id number]          PATIENT TYPE:  INP   LOCATION:  5153                         FACILITY:  [facility]   PHYSICIAN:  [name], M.D.   DATE OF BIRTH:  1928-12-28   DATE OF ADMISSION:  02/06/2006  DATE OF DISCHARGE:  02/10/2006                                 DISCHARGE SUMMARY   DISCHARGE DIAGNOSES:  1. Escherichia coli urinary tract infection and pyelonephritis with      associated gram-negative rod bacteremia.  2. Insulin-dependent diabetes mellitus.  3  Anemia of chronic disease.  1. End-stage renal disease.   PROCEDURES:  1. On February 07, 2006, CT abdomen without contrast with asymmetric,      perinephric soft tissue stranding on the right is nonspecific, but      could be a manifestation of right-sided pyelonephritis.  No      hydronephrosis or definite urinary tract calculi are demonstrated.      Cholelithiasis.  2. On February 07, 2006, pelvis CT without contrast with no acute pelvic      findings or focal inflammatory process demonstrated.  3. Hemodialysis.   HISTORY OF PRESENT ILLNESS:  [name] is a 77 year old, African American  female with end-stage renal disease secondary to diabetes mellitus who was  sent to the ER after hemodialysis secondary to a 24-hour history of nausea  and vomiting, right abdominal pain, left greater than right, who took a  laxative yesterday with two bowel movements.  She has complained of no  melena or hematochezia.  She did have Phenergan and had relief of vomiting  x8 hours, but remained nauseated.  The patient thinks she had an  appendectomy in 1956, but is not sure.  At the dialysis center, blood  cultures were done and vancomycin and [name] were given.   LABORATORY DATA AND X-RAY FINDINGS:  Admission labs with potassium 3.2,  glucose 168, creatinine 8.2, albumin 2.8.  WBC 12.6, hemoglobin 11.9,  platelets 115,000.   Acute abdominal series with no  active cardiopulmonary disease and  nonobstructive bowel prep gas pattern.   HOSPITAL COURSE:  Problem 1.  ESCHERICHIA COLI URINARY TRACT INFECTION AND  PYELONEPHRITIS WITH ASSOCIATED GRAM-NEGATIVE ROD BACTEREMIA:  The patient  was placed n.p.o. and was given IV Protonix and Reglan.  She also underwent  a CT scan of the abdomen as well as pelvis with results above.  She was  started empirically on IV vancomycin and Cipro.  Urinary cultures did grow  E. coli and vancomycin was stopped on February 08, 2006.  Other sources of  infection were ruled out.  The patient advanced her diet slowly and did  become afebrile and asymptomatic on the IV antibiotics and comfort measures.  No further interventions.   Problem 2.  INSULIN DEPENDENT DIABETES MELLITUS:  The patient was placed on  her outpatient dose of Lasix and Actos.  She was also placed on a sliding  scale insulin.  No events of hypoglycemia were documented and CBGs range  were 119-212 this admission which were covered with sliding scale  insulin.   Problem 3.  ANEMIA OF CHRONIC DISEASE:  The patient remained on her IV InFeD  and Epogen during this hospital admission.  No issues were noted with her  hemoglobin which did remain stable this admission.  Hemoglobin was 12.2 on  date of discharge.   Problem 4.  END-STAGE RENAL DISEASE:  The patient underwent her usual  hemodialysis without intervention.   DISCHARGE MEDICATIONS:  1. Norvasc 10 mg daily.  2. Nephro-Vite daily.  3. Actos 45 mg daily.  4. Zocor 80 mg daily.  5. Lantus 22 units subcu nightly.  6. Trusopt eye drops as directed.  7. Protonix 40 mg nightly.  8. Cipro 500 mg one p.o. daily x17 days.   DIET:  The patient will continue her renal diet as well as her fluid  restriction.  She will increase activity slowly and return to hemodialysis  at her usual scheduled time.   SPECIAL INSTRUCTIONS:  Hemodialysis instructions as follows:  Estimated dry  weight will be 75.0 kg,  otherwise no change in hemodialysis prescription.  The dialysis center needs to follow up on blood cultures x2 to ensure that  the gram-negative rod bacteremia in the blood cultures is sensitive to  Cipro.  If she is sensitive to Cipro, then she will continue this.  If not,  then they will need to call the [name] for a change in antibiotic therapy.      [name], [place]      [name], M.D.  Electronically Signed    MY/MEDQ  D:  02/22/2006  T:  02/23/2006  Job:  [id number]   cc:   [facility]  [facility]

## 2010-10-17 NOTE — Discharge Summary (Signed)
NAME:  [name], [name]                          ACCOUNT NO.:  [id number]   MEDICAL RECORD NO.:  [id number]                   PATIENT TYPE:  INP   LOCATION:  0345                                 FACILITY:  [facility]   PHYSICIAN:  Gordy Savers, M.D. [facility]      DATE OF BIRTH:  07-30-1928   DATE OF ADMISSION:  07/26/2003  DATE OF DISCHARGE:                                 DISCHARGE SUMMARY   FINAL DIAGNOSES:  1. Uncontrolled nonketotic diabetes mellitus.  2. Pyelonephritis.  3. Anemia.  4. Hematest positive stool.  5. Hypercholesterolemia.  6. Degenerative joint disease.  7. Dysphagia.  8. Gastroparesis.  9. Colonic polyps.  10.      Renal insufficiency.   PROCEDURE:  Colonoscopy, upper panendoscopy, blood transfusions.   DISCHARGE MEDICATIONS:  1. Protonix 40 mg daily.  2. MiraLax 17 gm daily.  3. Reglan 5 mg t.i.d. before meals.  4. Zocor 80 mg daily.  5. Actos 45 mg daily.  6. Glyburide 2.5 mg daily.  7. Norvasc 5 mg daily.  8. Metoprolol 50 mg b.i.d.  9. Cozaar 100 mg daily.  10.      Catapres two weekly.   HISTORY OF PRESENT ILLNESS:  The patient is a 77 year old black female with  a long history of diabetes mellitus. She was stable until the past few days  prior to admission when she developed worsening abdominal pain, weakness,  nausea, and vomiting. She became progressively weak with dizziness and  blurred vision. On arrival to the ER she was noted to have significant  hyperglycemia with a renal blood sugar of 727, BUN 43, and a creatinine of  3.7. CO2 content was normal at 24. White count was 17,000.  The patient was  admitted for further evaluation and treatment of her uncontrolled diabetes  with dehydration.   LABORATORY DATA AND HOSPITAL COURSE:  The patient was admitted to the  hospital where she was carefully rehydrated. She was treated with glucometer  and IV insulin, and blood sugars normalized in 24 hours.  She was then  placed on oral hypoglycemics and  blood sugars remained stable. Blood and  urine cultures were obtained and the latter revealed an E. coli urinary  tract infection. It was felt that the patient clinically had mild  pyelonephritis and was treated with IV Cipro. Due to her anemia and Hematest  positive stools, the patient underwent an GI evaluation that included  colonoscopies. Small polyps were identified and removed. The patient also  had an upper panendoscopy that revealed mild gastritis.  The urine culture  was pansensitive to all tested antibiotics. The hospital course was marked  by steady improvement.  Anemia studies included a low serum iron of 20, low  saturation of 14%. Ferritin was normal at 174.  Initial creatinine of 2.3  basically changed little with rehydration.  BUN improved from 26 to 16.  Hemoglobin A1C was elevated at 12.1 during the hospital. Due to progressive  anemia, the patient was transfused two units of packed red blood cells. She  received inpatient diabetic teaching. On the day prior to discharge, BUN was  27, creatinine 2.8, H&H 9 and 26.8 respectively.   DISPOSITION:  She will be discharged today on the medical regimen listed  above. She will follow up with her primary care physician within the next  week. She will be considered for additional outpatient diabetic teaching. At  the present time she is ambulatory unassisted and will be discharged to the  care of her family.   CONDITION ON DISCHARGE:  Improved.                                               Gordy Savers, M.D. [facility]    PFK/MEDQ  D:  08/06/2003  T:  08/06/2003  Job:  [id number]

## 2010-10-17 NOTE — H&P (Signed)
NAME:  [name], [name] NO.:  [id number]   MEDICAL RECORD NO.:  [id number]                   PATIENT TYPE:  EMS   LOCATION:  ED                                   [facility]AC[facility]   PHYSICIAN:  Gordy Savers, M.D. [facility]      DATE OF BIRTH:  August 06, 1928   DATE OF ADMISSION:  07/26/2003  DATE OF DISCHARGE:                                HISTORY & PHYSICAL   CHIEF COMPLAINT:  Weakness.   HISTORY OF PRESENT ILLNESS:  The patient is a 77 year old black female with  a greater than 15-year history of type 2 diabetes.  She has been fairly  stable until the past few days when she has developed worsening abdominal  pain, weakness, nausea, and vomiting.  Other complaints have been blurred  vision, dizziness.  Due to her progressive weakness, she presented to the  emergency department where she is noted to have significant hyperglycemia  with a random blood sugar of 727.  She had azotemia with a BUN of 43, and a  creatinine of 3.7.  CO2 content was normal at 24.  White count was 17,000.  The patient is now admitted for further evaluation and treatment of her  uncontrolled diabetes with dehydration.   PAST MEDICAL HISTORY:  1. The patient has a long history of type 2 diabetes.  2. Hypertension.  3. Hypercholesterolemia.  4. Osteoarthritis.  5. She had a remote hysterectomy.  6. There have been no hospital admissions in greater than 15 years.   MEDICAL REGIMEN:  1. Glyburide/metformin combination 2.5/500 once daily.  2. Actos 45 mg daily.  3. Hyzaar 100/25 daily.  4. Lipitor 40 mg daily.  5. Prevacid 30 mg daily.  6. Relafen 1 gm daily p.r.n.  7. More recently has been taking magnesium with citrate for her abdominal     pain.   REVIEW OF SYSTEMS:  Otherwise, fairly noncontributory.   EXAMINATION:  VITAL SIGNS:  Blood pressure 150/85, pulse 110, respiratory  rate 20, temperature 97.5.  GENERAL:  An elderly black female who appeared acutely ill, weak,  but alert  and conversant.  HEAD AND NECK:  Normal fundi.  Ears, nose, and throat unremarkable.  Mucous  membranes appear to be fairly moist.  Neck with no meningismus, adenopathy.  No bruits appreciated.  CHEST:  Clear anterolaterally.  CARDIOVASCULAR:  A rapid regular tachycardia.  ABDOMEN:  Surgical scars in the lower midline.  There was no distention or  focal tenderness.  Bowel sounds were diminished.  No guarding or rebound  noted.  EXTREMITIES:  No edema.   IMPRESSION:  1. Uncontrolled diabetes.  2. Abdominal pain with mild hyperamylasemia.  3. Dehydration.   DISPOSITION:  The patient will be carefully rehydrated.  She will be placed  on a Glucomander IV insulin drip for glycemic control.  An abdominal  ultrasound will be obtained in the morning to assess the gallbladder and  extrahepatic tree of the pancreas.  Will also assess renal size. Her  medications will be held at the present time, except for Prevacid and  symptomatic treatment.  She will be placed on a clear liquid diet, and this  will be advanced as tolerated.                                               Gordy Savers, M.D. [facility]    PFK/MEDQ  D:  07/26/2003  T:  07/26/2003  Job:  [id number]

## 2010-10-17 NOTE — Op Note (Signed)
NAMEAPPOLLONIA, [name]                ACCOUNT NO.:  [id number]   MEDICAL RECORD NO.:  [id number]          PATIENT TYPE:  AMB   LOCATION:  SDS                          FACILITY:  [facility]   PHYSICIAN:  Quita Skye. Hart Rochester, M.D.  DATE OF BIRTH:  10/13/1928   DATE OF PROCEDURE:  02/13/2006  DATE OF DISCHARGE:  02/13/2006                                 OPERATIVE REPORT   PREOPERATIVE DIAGNOSIS:  End-stage renal disease with thrombosed AV graft  left forearm.   POSTOPERATIVE DIAGNOSIS:  End-stage renal disease with thrombosed AV graft  left forearm.   OPERATION:  Thrombectomy AV Gore-Tex graft left forearm with insertion of a  new segment of a 6 mm Gore-Tex graft and existing graft to high brachial  vein.   SURGEON:  Quita Skye. Hart Rochester, M.D.   FIRST ASSISTANT:  Nurse.   ANESTHESIA:  Local.   PROCEDURE:  The patient was taken to the operating room, placed in the  supine position at which time the left upper extremity was prepped with  Betadine scrubbing solution and draped in a routine sterile manner.  After  infiltration with 1% Xylocaine with epinephrine, longitudinal incision was  made through the previous scar in the mid upper arm and the Gore-Tex to  brachial vein anastomosis was dissected free.  Heparin 3000 units was given  intravenously.  Transverse opening made in the graft.  The graft itself was  easily thrombectomized having been revised well up into the upper arm.  There was excellent inflow present.  No narrowing noted in the graft.  Venous anastomosis was filled with intimal hyperplasia over about 2 to 3 cm  and proximal to this, the vein was 4.5 mm in size of adequate quality.  After excising the old anastomosis, a new 6 mm graft was anastomosed end-to-  end to the old graft and end-to-end to the proximal vein after spatulating  both graft and the vein with  6-0 Prolene.  Clamps then released and there  was an excellent pulse and palpable thrill in the graft.  No protamine was  given.  Wound was irrigated with saline, closed in layers with Vicryl in  subcuticular fashion.  Sterile dressing applied.  The patient taken to  recovery room in satisfactory condition.           ______________________________  Quita Skye Hart Rochester, M.D.     JDL/MEDQ  D:  02/13/2006  T:  02/15/2006  Job:  [id number]

## 2010-10-17 NOTE — Op Note (Signed)
[name], [name]                ACCOUNT NO.:  [id number]   MEDICAL RECORD NO.:  [id number]          PATIENT TYPE:  AMB   LOCATION:  SDS                          FACILITY:  [facility]   PHYSICIAN:  Di Kindle. Edilia Bo, M.D.DATE OF BIRTH:  [birth year]   DATE OF PROCEDURE:  03/12/2006  DATE OF DISCHARGE:  03/12/2006                                 OPERATIVE REPORT   PREOPERATIVE DIAGNOSIS:  Chronic renal failure.   POSTOPERATIVE DIAGNOSIS:  Chronic renal failure.   PROCEDURE:  New left upper arm AV graft.   SURGEON:  Edilia Bo, M.D.   ASSISTANT:  Pecola Leisure, P.A.   ANESTHESIA:  Local with sedation.   TECHNIQUE:  The patient was taken to the operating room and sedated by  anesthesia.  Left upper extremity was prepped and draped in the usual  sterile fashion.  After the skin was infiltrated with 1% lidocaine, an  oblique incision was made above the antecubital space.  Here, a segment of  the old graft was excised and ligated at both ends.  The brachial artery was  dissected free beneath the fascia.  A separate longitudinal incision was  made beneath the axilla after the skin was anesthetized.  The high brachial  vein was dissected free.  A 4- to 7-mm graft was then tunneled between the 2  incisions.  The patient was then heparinized.  The brachial artery was  clamped proximally and distally, and a longitudinal arteriotomy was made.  A  segment of 4 mm into the graft was excised.  The graft was spatulated and  sewn end-to-side to the brachial artery using continuous 6-0 Prolene suture.  The graft was then pulled to the appropriate length for anastomosis to the  brachial vein.  The vein was ligated distally and spatulated proximally.  The graft was cut to appropriate length, spatulated and sewn end-to-end to  the vein using continuous 6-0 Prolene suture.  At the completion, there was  an excellent thrill in the graft.  There was a good radial and ulnar signal  with the Doppler.   Hemostasis was obtained in the wounds.  The heparin was  partially reversed with protamine.  The wounds were closed with deep layer  of 3-0 Vicryl and the skin closed with 4-0 Vicryl.  Sterile dressing was  applied.  The patient tolerated the procedure well and was transferred to  recovery room in satisfactory condition.  All needle and sponge counts were  correct.      Di Kindle. Edilia Bo, M.D.  Electronically Signed     CSD/MEDQ  D:  03/12/2006  T:  03/15/2006  Job:  [id number]

## 2010-10-17 NOTE — Op Note (Signed)
[name], [name] NO.:  [id number]   MEDICAL RECORD NO.:  [id number]          PATIENT TYPE:  OIB   LOCATION:  2899                         FACILITY:  [facility]   PHYSICIAN:  Larina Earthly, M.D.    DATE OF BIRTH:  05-31-1929   DATE OF PROCEDURE:  01/06/2005  DATE OF DISCHARGE:  01/06/2005                                 OPERATIVE REPORT   PREOPERATIVE DIAGNOSIS:  End-stage renal disease with occluded left forearm  loop arteriovenous Gore-Tex graft.   POSTOPERATIVE DIAGNOSIS:  End-stage renal disease with occluded left forearm  loop arteriovenous Gore-Tex graft.   PROCEDURE:  Thrombectomy, interposition jump graft revision to higher  brachial vein, left forearm arteriovenous Gore-Tex graft.   SURGEON:  Larina Earthly, M.D.   ASSISTANT:  Nurse.   ANESTHESIA:  MAC.   COMPLICATIONS:  None.   DISPOSITION:  To the recovery room stable.   DESCRIPTION OF PROCEDURE:  The patient was taken to the operating room and  placed in the supine position where there the left arm was prepped and  draped in the usual sterile fashion.  Incision was made over the prior  venous anastomosis to the above the elbow medial upper arm.  The graft was  isolated at its anastomosis.  The graft was opened transversely near the  venous anastomosis.  There was a tight stenosis at the venous anastomosis.  The vein was larger caliber above this.  The basilic vein was also exposed  and was of very small caliber. The brachial vein was exposed further  proximally and was of good caliber.  A new 7 mm interposition graft Gore-Tex  was brought onto the field.  The vein was occluded proximally and was opened  with an 11 blade and extended longitudinally with Potts scissors. The graft  was spatulated and sewn end-to-side to the vein with a  running 6-0 Prolene  suture.  Clamps were removed, and the graft was flushed with heparinized  saline and reoccluded.  This was brought to approximation with the  old  graft. The Gore-Tex graft was thrombectomized.  The arterialized plug was  removed and excellent flow was encountered.  The interposition graft was cut  to the appropriate length and was  sewn end-to-end to the old graft with a running 6-0 Prolene suture.  Clamps  were removed and an excellent thrill was noted.  The wounds were irrigated  saline and hemostasis with electrocautery.  The wounds were closed with 3-0  Vicryl in the subcutaneous and subcuticular tissue.  Benzoin and Steri-  Strips were applied.      Larina Earthly, M.D.  Electronically Signed     TFE/MEDQ  D:  01/06/2005  T:  01/07/2005  Job:  [id number]

## 2010-10-17 NOTE — Op Note (Signed)
[place]  Patient:    [name], [name] Visit Number: [id number] MRN: [id number]          Service Type: DSU Location: [phone] Attending Physician:  Ernesto Rutherford Dictated by:   Ernesto Rutherford, M.D. Proc. Date: 03/07/01 Admit Date:  03/07/2001 Discharge Date: 03/08/2001                             Operative Report  PREOPERATIVE DIAGNOSES: 1. Clinically significant macular edema, left eye, recalcitrant or resistant    to previous laser treatment. 2. Nonproliferative diabetic retinopathy, progressive, of the left eye.  POSTOPERATIVE DIAGNOSES: 1. Clinically significant macular edema, left eye, recalcitrant or resistant    to previous laser treatment. 2. Nonproliferative diabetic retinopathy, progressive, of the left eye. 3. Mild epiretinal membrane, left eye.  PROCEDURES: 1. Posterior vitrectomy with membrane peel, left eye, with significant    internal limiting membrane striae noted even after the posterior hyaloid    had been removed. 2. Endolaser panretinal photocoagulation, left eye.  SURGEON:  Ernesto Rutherford, M.D.  ANESTHESIA:  Local retrobulbar with monitored anesthesia control.  INDICATION FOR PROCEDURE:  The patient is a 77 year old woman with profound vision loss, left eye, progressive and not responding to usual customary treatment, with diabetic retinopathy and macular edema.  This is an attempt to remove the posterior hyaloid and to enhance macular perfusion and to resolve the macular edema.  The patient understands the risks of anesthesia, including the rare occurrence of death, and loss to the eye, including hemorrhage, infection, scarring, need for further surgery, no change in vision, loss of vision, and progression of disease despite intervention.  DESCRIPTION OF PROCEDURE:  After appropriate signed consent was obtained, the patient was taken to the operating room.  In the operating room, the appropriate  monitoring followed by mild sedation.  Marcaine 0.75% delivered 5 cc retrobulbar followed by an additional 5 cc laterally in the fashion of modified Darel Hong.  Left periocular region was then sterilely prepped and draped in the usual ophthalmic fashion.  Lid speculum applied.  Conjunctival peritomy fashioned temporally and superonasally.  A 4 mm infusion was secured 3.5 mm posterior to the limbus in the inferotemporal quadrant.  Placement in the vitreous cavity was then verified.  Superior sclerotomies were fashioned. The wall microscope was positioned with BIOM attached.  Core vitrectomy was then begun.  Iatrogenic ILM detachment was then carried out without difficulty over the posterior pole and then carried out anterior to the equator 360 degrees and the vitreous skirt circumcised for 360 degrees.  ILM forceps were then used to engage the ILM, and this was removed without difficulty from the macular region.  This removed the macular striae, and prophylactic endolaser photocoagulation was then carried out peripherally and posteriorly. Instruments were removed from the eye.  Superior sclerotomies were then closed.  The infusion removed and similarly closed with 7-0 Vicryl suture. Conjunctiva closed with 7-0 Vicryl.  Subconjunctival injections of antibiotic and steroid applied.  The patient tolerated the procedure well without complication.  She was taken to the recovery room in good and stable condition. Dictated by:   Ernesto Rutherford, M.D. Attending Physician:  Ernesto Rutherford DD:  03/31/01 TD:  03/31/01 Job: [id number]

## 2010-10-17 NOTE — Op Note (Signed)
[name], SCHERER NO.:  [id number]   MEDICAL RECORD NO.:  [id number]          PATIENT TYPE:  OIB   LOCATION:  2899                         FACILITY:  [facility]   PHYSICIAN:  Quita Skye. Hart Rochester, M.D.  DATE OF BIRTH:  03/06/1929   DATE OF PROCEDURE:  08/22/2004  DATE OF DISCHARGE:                                 OPERATIVE REPORT   PREOPERATIVE DIAGNOSIS:  End-stage renal disease.   POSTOPERATIVE DIAGNOSIS:  End-stage renal disease.   OPERATION:  1.  Insertion of left forearm arteriovenous Gore-Tex graft from brachial      artery to brachial vein (4 mm - 7 mm stretch).  2.  Bilateral ultrasound localization of internal jugular veins.  3.  Insertion of Diatek catheter in right internal jugular vein (24 cm).   SURGEON:  Quita Skye. Hart Rochester, M.D.   FIRST ASSISTANT:  Jerold Coombe, P.A.   ANESTHESIA:  Local.   PROCEDURE:  The patient was taken to the operating room and placed in the  supine position, at which time the left upper extremity was prepped with  Betadine scrub and solution and draped in routine sterile manner.  After  infiltration of 1% Xylocaine with epinephrine, a transverse incision was  made in the antecubital area and antecubital vein was dissected free.  Basilic branch was absent.  The cephalic branch was small and sclerotic.  Brachial artery was exposed beneath the fascia and encircled with  Vesseloops.  There was an adjacent brachial vein, but it was only 4 mm in  size.  A second incision was made in the distal upper arm and the brachial  vein was larger at this point, increasing to 5 mm in size.  A loop-shaped  tunnel was then created in the forearm after infiltrating with 1% Xylocaine  and using a small counterincision at the apex of the loop, a 4 x 7-mm  stretch Gore-Tex graft delivered through the tunnel and 3000 units of  heparin given intravenously.  Artery was occluded proximally and distally  with Vesseloops, opened with a 15 blade and  extended with Potts scissors.  The 4-mm of the graft spatulated and anastomosed end-to-side with 6-0  Prolene.  The 7-mm end of the graft was then spatulated and anastomosed end-  to-side to the brachial vein with 6-0 Prolene.  Clamp was then released and  there was a good pulse and thrill in the graft and good radial arterial flow  with the graft open.  No protamine was given.  The wound was irrigated with  saline and closed in layers with Vicryl in a subcuticular fashion, a sterile  dressing applied.  Attention was turned to the upper chest and neck, which  were exposed, and both internal jugular veins were imaged using B-mode  ultrasound and both noted to be widely patent.  After prepping and draping  in a routine sterile manner, the right internal jugular vein was entered  using a supraclavicular approach, a guidewire passed into the right atrium  under fluoroscopic guidance.  After dilating the tract appropriately, a 24-cm Diatek catheter was passed  through  a peel-away sheath positioned in the right atrium, tunneled  peripherally and secured with nylon sutures.  The wound was closed with  Vicryl in a subcuticular fashion, sterile dressing applied and the patient  taken to the recovery room in satisfactory condition.      JDL/MEDQ  D:  08/22/2004  T:  08/22/2004  Job:  [id number]

## 2010-10-17 NOTE — Discharge Summary (Signed)
[name], [name] NO.:  [id number]   MEDICAL RECORD NO.:  [id number]          PATIENT TYPE:  INP   LOCATION:  5524                         FACILITY:  [facility]   PHYSICIAN:  Maree Krabbe, M.D.DATE OF BIRTH:  1929/02/09   DATE OF ADMISSION:  08/22/2004  DATE OF DISCHARGE:  08/24/2004                                 DISCHARGE SUMMARY   ADMISSION DIAGNOSES:  1.  Chronic kidney disease, stage V with mild volume excess.  2.  Secondary hyperparathyroidism.  3.  Iron deficiency anemia.  4.  Insulin-dependent diabetes mellitus.  5.  Hypertension.  6.  Hyperlipidemia.  7.  Nausea and vomiting probably secondary to uremia.  8.  Status post new left arm arteriovenous Gore-Tex graft and right internal      jugular Diatek catheter on admission.   DISCHARGE DIAGNOSES:  1.  New end-stage renal disease now on chronic hemodialysis.  2.  Secondary hyperparathyroidism.  3.  Iron deficiency anemia.  4.  Insulin-dependent diabetes mellitus.  5.  Hypertension.  6.  Hyperlipidemia.  7.  Nausea and vomiting, resolved.  8.  Status post new left arm arteriovenous Gore-Tex graft and right internal      jugular Diatek catheter placement, March 24, by Dr. Hart Rochester.   BRIEF HISTORY:  A 77 year old black female with insulin-dependent diabetes  mellitus, chronic renal failure secondary to diabetic nephropathy stage V.  Creatinines have been in the 9's.  She also has hypertension and is now  status post left forearm AV Gore-Tex graft placement with a right internal  jugular Diatek catheter.  She is experiencing nausea and vomiting in the  recovery room, complaining of shortness of breath at home.  She is admitted  to initiate dialysis as an inpatient.  Outpatient arrangements had been  finalized for a Tuesday/Thursday/Saturday spot at the [facility] at 11:15 a.m.   LABORATORY DATA:  White count 9400, hemoglobin 8.6, platelets 145,000.  Sodium 138, potassium  3.2, chloride 108, CO2 19, glucose 74, BUN 96,  creatinine 10.0, calcium 8.2, phosphorus 6.8.   Chest x-ray; cardiomegaly with low volume, but no acute cardiopulmonary  process.  Dialysis catheter tips are in the region of the cavoatrial  junction and lower SVC.   HOSPITAL COURSE:  It was felt that the patient was mildly uremic and  therefore she was admitted and dialysis was initiated.  She was dialyzed on  March 24 and then again on March 25.  She tolerated both dialyses fairly  well.  A total of 2.5 liters of ultrafiltrate were removed.  Her blood  pressure at the end of the second treatment was approximately 160/70.  Dietary instruction was given.  She was to be discharged on March 25, but  experienced post dialysis fevers to 100.1.  Blood cultures were drawn and  she was treated with 1 gram of IV Ancef.  Her white count remained normal.  Her fever resolved and she felt much better.  She was discharged home  afebrile and clinically improved.  She is to dialyze at the [facility] every  Tuesday/Thursday/Saturday at 11:30 a.m.   EPO was given for her anemia.  Transferrin saturation level was 22% with a  ferritin of 328 and for that InFeD protocol was started.  She has known  secondary hyperparathyroidism, having been followed as an outpatient and her  oral vitamin D was converted to Calcitriol 1 mcg IV each dialysis.  Clonidine was decreased to 0.1 mg b.i.d. tapering her antihypertensives  downward as we challenged fluid removal on dialysis.  At the time of  discharge, her blood pressure was approximately 110/70.   DISCHARGE MEDICATIONS:  1.  Dialyvite vitamin one daily.  2.  Calcium carbonate 500 mg one with each meal.  3.  Actos 45 mg daily.  4.  Lantus 4 units subcu q.a.m.  5.  Glyburide 5 mg b.i.d.  6.  Zocor 80 mg daily.  7.  Reglan 5 mg one before each meal and at bedtime.  8.  Protonix 40 mg at bedtime.  9.  Clonidine 0.2 mg b.i.d., hold before  dialysis.  10. InFeD 50 mg IV each dialysis x10 and then weekly.  11. Calcitriol 1.0 mcg IV each dialysis.  12. EPO 10,000 units IV each dialysis.  13. Ancef 2 grams IV each dialysis x3 treatments and then blood cultures      drawn on March 24, will be followed up as an outpatient.   She was instructed to avoid showers as long as she had her catheter.  Follow  her renal and diabetic diets.   Dialysis Tuesday/Thursday/Saturday at 11:30 a.m. at the [facility].       RRK/MEDQ  D:  11/01/2004  T:  11/03/2004  Job:  [id number]   cc:   Quita Skye. Hart Rochester, M.D.  [address]  [place]  [place] [address]

## 2010-10-17 NOTE — Op Note (Signed)
NAMEGARLAND, SMOUSE                ACCOUNT NO.:  [id number]   MEDICAL RECORD NO.:  [id number]          PATIENT TYPE:  AMB   LOCATION:  SDS                          FACILITY:  [facility]   PHYSICIAN:  Di Kindle. Edilia Bo, M.D.DATE OF BIRTH:  31-May-1929   DATE OF PROCEDURE:  03/02/2006  DATE OF DISCHARGE:  03/02/2006                                 OPERATIVE REPORT   PREOPERATIVE DIAGNOSIS:  Chronic renal failure.   POSTOPERATIVE DIAGNOSIS:  Chronic renal failure.   PROCEDURE:  Ultrasound-guided placement of a right IJ Diatek catheter.   SURGEON:  Di Kindle. Edilia Bo, M.D.   ANESTHESIA:  Local with sedation.   TECHNIQUE:  The patient was taken to the operating room and sedated by  anesthesia.  The ultrasound scanner was used to mark both internal jugular  veins, both of which appeared to be patent.  The neck and upper chest were  then prepped and draped in the usual sterile fashion.  After the skin was  infiltrated with 1% lidocaine, the right IJ was cannulated and a guidewire  introduced into the superior vena cava under fluoroscopic control.  The  tract over the wire was dilated, and then a dilator and peel-away sheath  were passed over the wire, and the wire and dilator removed.  The catheter  was then passed through the peel-away sheath and positioned in the right  atrium.  The exit site for the catheter was selected, and the skin  anesthetized between the two years. The catheter was then brought through  the tunnel, cut to the appropriate length, and the distal ports were  attached.  Both ports withdrew easily.  We then flushed with heparinized  saline and filled with concentrated heparin.  The catheter was secured at  its exit site with a 3-0 nylon suture.  The IJ cannulation site was closed  with 4-0 subcuticular stitch.  A sterile dressing was applied.  The patient  tolerated the procedure well and was transferred to recovery room in  satisfactory condition.  All needle  and sponge counts were correct.      Di Kindle. Edilia Bo, M.D.  Electronically Signed     CSD/MEDQ  D:  03/02/2006  T:  03/03/2006  Job:  [id number]

## 2010-10-27 ENCOUNTER — Inpatient Hospital Stay (HOSPITAL_COMMUNITY)
Admission: EM | Admit: 2010-10-27 | Discharge: 2010-10-30 | DRG: 689 | Disposition: A | Discharge status: Home / Self Care | Payer: Medicare Other | Attending: Hospitalist | Admitting: Hospitalist

## 2010-10-27 ENCOUNTER — Emergency Department (HOSPITAL_COMMUNITY): Payer: Medicare Other

## 2010-10-27 DIAGNOSIS — I120 Hypertensive chronic kidney disease with stage 5 chronic kidney disease or end stage renal disease: Secondary | ICD-10-CM | POA: Diagnosis present

## 2010-10-27 DIAGNOSIS — Z794 Long term (current) use of insulin: Secondary | ICD-10-CM

## 2010-10-27 DIAGNOSIS — N039 Chronic nephritic syndrome with unspecified morphologic changes: Secondary | ICD-10-CM | POA: Diagnosis present

## 2010-10-27 DIAGNOSIS — F3289 Other specified depressive episodes: Secondary | ICD-10-CM | POA: Diagnosis present

## 2010-10-27 DIAGNOSIS — N390 Urinary tract infection, site not specified: Principal | ICD-10-CM | POA: Diagnosis present

## 2010-10-27 DIAGNOSIS — Z8614 Personal history of Methicillin resistant Staphylococcus aureus infection: Secondary | ICD-10-CM

## 2010-10-27 DIAGNOSIS — Z992 Dependence on renal dialysis: Secondary | ICD-10-CM

## 2010-10-27 DIAGNOSIS — N2581 Secondary hyperparathyroidism of renal origin: Secondary | ICD-10-CM | POA: Diagnosis present

## 2010-10-27 DIAGNOSIS — F0390 Unspecified dementia without behavioral disturbance: Secondary | ICD-10-CM | POA: Diagnosis present

## 2010-10-27 DIAGNOSIS — N186 End stage renal disease: Secondary | ICD-10-CM | POA: Diagnosis present

## 2010-10-27 DIAGNOSIS — E1149 Type 2 diabetes mellitus with other diabetic neurological complication: Secondary | ICD-10-CM | POA: Diagnosis present

## 2010-10-27 DIAGNOSIS — K3184 Gastroparesis: Secondary | ICD-10-CM | POA: Diagnosis present

## 2010-10-27 DIAGNOSIS — E785 Hyperlipidemia, unspecified: Secondary | ICD-10-CM | POA: Diagnosis present

## 2010-10-27 DIAGNOSIS — D631 Anemia in chronic kidney disease: Secondary | ICD-10-CM | POA: Diagnosis present

## 2010-10-27 DIAGNOSIS — F329 Major depressive disorder, single episode, unspecified: Secondary | ICD-10-CM | POA: Diagnosis present

## 2010-10-27 DIAGNOSIS — Z7982 Long term (current) use of aspirin: Secondary | ICD-10-CM

## 2010-10-27 DIAGNOSIS — R112 Nausea with vomiting, unspecified: Secondary | ICD-10-CM | POA: Diagnosis present

## 2010-10-27 DIAGNOSIS — K219 Gastro-esophageal reflux disease without esophagitis: Secondary | ICD-10-CM | POA: Diagnosis present

## 2010-10-27 LAB — BASIC METABOLIC PANEL
Calcium: 9.8 mg/dL (ref 8.4–10.5)
Creatinine, Ser: 6.95 mg/dL — ABNORMAL HIGH (ref 0.4–1.2)
GFR calc Af Amer: 7 mL/min — ABNORMAL LOW (ref >60)
GFR calc non Af Amer: 6 mL/min — ABNORMAL LOW (ref >60)
Sodium: 139 mEq/L (ref 135–145)

## 2010-10-27 LAB — CBC
HCT: 40.1 % (ref 36.0–46.0)
Hemoglobin: 13.6 g/dL (ref 12.0–15.0)
MCH: 28.3 pg (ref 26.0–34.0)
MCV: 83.4 fL (ref 78.0–100.0)
Platelets: 165 10*3/uL (ref 150–400)
RBC: 4.81 MIL/uL (ref 3.87–5.11)

## 2010-10-27 LAB — URINE MICROSCOPIC-ADD ON

## 2010-10-27 LAB — DIFFERENTIAL
Eosinophils Absolute: 0 10*3/uL (ref 0.0–0.7)
Lymphs Abs: 1.1 10*3/uL (ref 0.7–4.0)
Monocytes Relative: 8 % (ref 3–12)
Neutrophils Relative %: 72 % (ref 43–77)

## 2010-10-27 LAB — URINALYSIS, ROUTINE W REFLEX MICROSCOPIC
Bilirubin Urine: NEGATIVE
Glucose, UA: NEGATIVE mg/dL
Ketones, ur: NEGATIVE mg/dL
pH: 8 (ref 5.0–8.0)

## 2010-10-28 ENCOUNTER — Inpatient Hospital Stay (HOSPITAL_COMMUNITY): Payer: Medicare Other

## 2010-10-28 LAB — GLUCOSE, CAPILLARY
Glucose-Capillary: 105 mg/dL — ABNORMAL HIGH (ref 70–99)
Glucose-Capillary: 167 mg/dL — ABNORMAL HIGH (ref 70–99)
Glucose-Capillary: 197 mg/dL — ABNORMAL HIGH (ref 70–99)

## 2010-10-28 LAB — CBC
MCH: 27.7 pg (ref 26.0–34.0)
Platelets: 130 10*3/uL — ABNORMAL LOW (ref 150–400)
RBC: 4.33 MIL/uL (ref 3.87–5.11)
RDW: 19.9 % — ABNORMAL HIGH (ref 11.5–15.5)
WBC: 9.2 10*3/uL (ref 4.0–10.5)

## 2010-10-28 LAB — RENAL FUNCTION PANEL
Albumin: 2.9 g/dL — ABNORMAL LOW (ref 3.5–5.2)
BUN: 51 mg/dL — ABNORMAL HIGH (ref 6–23)
Chloride: 95 mEq/L — ABNORMAL LOW (ref 96–112)
Creatinine, Ser: 7.92 mg/dL — ABNORMAL HIGH (ref 0.4–1.2)
Glucose, Bld: 178 mg/dL — ABNORMAL HIGH (ref 70–99)
Potassium: 3.6 mEq/L (ref 3.5–5.1)

## 2010-10-28 LAB — URINE CULTURE
Colony Count: NO GROWTH
Culture  Setup Time: 201205281500

## 2010-10-28 LAB — DIFFERENTIAL
Basophils Relative: 0 % (ref 0–1)
Eosinophils Absolute: 0 10*3/uL (ref 0.0–0.7)
Eosinophils Relative: 0 % (ref 0–5)
Neutrophils Relative %: 82 % — ABNORMAL HIGH (ref 43–77)

## 2010-10-28 LAB — MAGNESIUM: Magnesium: 2.2 mg/dL (ref 1.5–2.5)

## 2010-10-28 LAB — CARDIAC PANEL(CRET KIN+CKTOT+MB+TROPI)
Relative Index: 1.9 (ref 0.0–2.5)
Troponin I: <0.3 ng/mL (ref <0.30)

## 2010-10-29 LAB — BASIC METABOLIC PANEL
CO2: 30 mEq/L (ref 19–32)
Calcium: 9 mg/dL (ref 8.4–10.5)
Chloride: 102 mEq/L (ref 96–112)
GFR calc Af Amer: 12 mL/min — ABNORMAL LOW (ref >60)
Sodium: 141 mEq/L (ref 135–145)

## 2010-10-29 LAB — GLUCOSE, CAPILLARY
Glucose-Capillary: 88 mg/dL (ref 70–99)
Glucose-Capillary: 133 mg/dL — ABNORMAL HIGH (ref 70–99)
Glucose-Capillary: 82 mg/dL (ref 70–99)

## 2010-10-29 LAB — CBC
Hemoglobin: 10.9 g/dL — ABNORMAL LOW (ref 12.0–15.0)
Platelets: 119 10*3/uL — ABNORMAL LOW (ref 150–400)
RBC: 3.98 MIL/uL (ref 3.87–5.11)
WBC: 5.6 10*3/uL (ref 4.0–10.5)

## 2010-10-30 ENCOUNTER — Inpatient Hospital Stay (HOSPITAL_COMMUNITY): Payer: Medicare Other

## 2010-10-30 LAB — CBC
MCH: 27.6 pg (ref 26.0–34.0)
MCV: 84.7 fL (ref 78.0–100.0)
Platelets: 126 10*3/uL — ABNORMAL LOW (ref 150–400)
RBC: 4.06 MIL/uL (ref 3.87–5.11)
RDW: 19.7 % — ABNORMAL HIGH (ref 11.5–15.5)
WBC: 4.7 10*3/uL (ref 4.0–10.5)

## 2010-10-30 LAB — RENAL FUNCTION PANEL
BUN: 24 mg/dL — ABNORMAL HIGH (ref 6–23)
CO2: 28 mEq/L (ref 19–32)
Calcium: 9.2 mg/dL (ref 8.4–10.5)
Chloride: 100 mEq/L (ref 96–112)
Creatinine, Ser: 6 mg/dL — ABNORMAL HIGH (ref 0.4–1.2)
GFR calc Af Amer: 8 mL/min — ABNORMAL LOW (ref >60)
GFR calc non Af Amer: 7 mL/min — ABNORMAL LOW (ref >60)

## 2010-10-30 LAB — GLUCOSE, CAPILLARY: Glucose-Capillary: 85 mg/dL (ref 70–99)

## 2010-11-07 NOTE — Discharge Summary (Signed)
  [name], [name]                ACCOUNT NO.:  [id number]  MEDICAL RECORD NO.:  [id number]           PATIENT TYPE:  I  LOCATION:  2007                         FACILITY:  [facility]  PHYSICIAN:  Sundra Aland, MD      DATE OF BIRTH:  01/26/1929  DATE OF ADMISSION:  10/27/2010 DATE OF DISCHARGE:  10/30/2010                              DISCHARGE SUMMARY   DISCHARGE DISPOSITION:  To home.  DISCHARGE DIAGNOSES: 1. Nausea and vomiting, which has resolved. 2. Abdominal pain, resolved. 3. Culture negative for urinary tract infection. 4. End-stage renal disease on hemodialysis on Tuesday, Thursday, and     Sunday. 5. Hypertension. 6. Depression. 7. Mild dementia. 8. History of methicillin-susceptible Staphylococcus aureus and     urinary tract infection that is responded.  DISCHARGE MEDICATIONS:  New medications is going to be Cipro 250 mg p.o. b.i.d. for one more week.  HOME MEDICATIONS:  The patient will continue on all her medications prior to admission: 1. Iron gluconate 12.5 mg per meal intravenous with hemodialysis. 2. Calcitrol 5 mcg __________ 3. Aspirin 81 mg p.o. daily. 4. Cosopt eye drops one drop with eyes twice daily. 5. Daily vitamin __________ p.o. daily. 6. Lantus 5 units subcu daily. 7. Lexapro 10 mg __________ p.o. daily. 8. Nexium 20 mg p.o. daily. 9. __________ 225 mg Monday, Tuesday, and Saturday. 10.Ultram 50 mg p.o. daily.  HOSPITAL COURSE:  [name] is an 77 year old African American female with history of diabetes mellitus type 2 for over 15 years, hypertension, end-stage renal disease who presented to the ED because of generalized weakness, nausea and vomiting.  She had a glucose of 170. This is apparently because she has been vomiting and not able to eat and is therefore not taking her Lantus like she should.  She was placed on Glucommander.  Her blood sugar is now well-controlled.  Today, her blood sugar is 80.  She does have an urinary tract  infection.  She was started on empiric Levaquin __________ that the last time she had MSSA, a UTI that was sensitive to Levaquin.  She had been afebrile.  Blood cultures have been negative.  Urine cultures have been negative.  Her blood sugar is well controlled and her urine culture is negative.  The patient is afebrile without leukocytosis.  We will be discharging the patient home in stable clinical condition.  Vital signs today are stable.  Blood pressure is 139/64, heart rate 78, respirations 11-19, temperature 97.1, and oxygen saturation 96% on room air.  She therefore will be discharged in stable clinical condition.  DISCHARGE DIET:  ADA 1800-calorie diet that is carbohydrate modified diet.  DISCHARGE ACTIVITY:  As tolerated.  DISCHARGE FOLLOWUP:  The patient is to follow with her dialysis Tuesday, Thursday, and Saturday.  The patient is to follow up with her primary care physician in 1-2 weeks.     Sundra Aland, MD     LA/MEDQ  D:  10/30/2010  T:  10/31/2010  Job:  [id number]  Electronically Signed by Sundra Aland MD on 11/07/2010 02:54:07 PM

## 2010-11-12 NOTE — H&P (Signed)
NAMESHUNTAE, HERZIG NO.:  [id number]  MEDICAL RECORD NO.:  [id number]           PATIENT TYPE:  E  LOCATION:  [facility]                         FACILITY:  [facility]  PHYSICIAN:  Ramiro Harvest, MD    DATE OF BIRTH:  01-18-1929  DATE OF ADMISSION:  10/27/2010 DATE OF DISCHARGE:                             HISTORY & PHYSICAL   PRIMARY CARE PHYSICIAN:  Stacie Glaze, MD, of [facility].  CHIEF COMPLAINT:  Nausea and vomiting.  HISTORY OF PRESENT ILLNESS:  [name] is am 77 year old African American female with history of end-stage renal disease, on hemodialysis on Tuesdays, Thursdays, and Saturdays, history of type 2 diabetes with gastroparesis, history of hyperlipidemia, mild dementia, gastroesophageal reflux disease, depression, recent hospitalization in February 2012 for sepsis secondary to MSSA urinary tract infection presenting to the ED with a 3-day history of nausea, nonbloody emesis. The patient denies any fever.  No chills, no chest pain, no shortness of breath, no diarrhea, no constipation.  No dysuria, no cough, no focal neurological symptoms.  The patient does endorse some generalized weakness.  The patient was seen in the ED, acute abdominal series which was done was negative.  Urinalysis was consistent with a urinary tract infection.  Magnesium levels were within normal limits.  BMET with a BUN/creatinine of 44/6.95.  CBC was within normal limits.  We were called to admit the patient for further evaluation and management.  ALLERGIES:  No known drug allergies.  PAST MEDICAL HISTORY: 1. History of end-stage renal disease, on hemodialysis on Tuesday,     Thursday, Saturdays.  The patient with a right upper extremity     graft. 2. Hyperlipidemia. 3. Type 2 diabetes with gastroparesis. 4. Hypertension. 5. Gastroesophageal reflux disease. 6. Osteoarthritis. 7. Depression. 8. Recent hospitalization in February 2012 for sepsis secondary  to     MSSA urinary tract infection. 9. Mild dementia. 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 11.Chronic right upper extremity pain and numbness. 12.History of dysphagia.  HOME MEDICATIONS: 1. Ultram 50 mg p.o. b.i.d. 2. Quinine 324 mg on Tuesdays, Thursdays, and Saturdays. 3. Nexium 40 mg p.o. daily. 4. Zocor 80 mg p.o. nightly. 5. Lexapro 10 mg half a tablet p.o. daily. 6. Lantus 5 units nightly. 7. Dialyvite 1 tablet p.o. daily. 8. Cosopt ophthalmic 2/0.5% one drop to both eyes twice daily. 9. Aspirin 81 mg p.o. nightly.  FAMILY HISTORY:  Noncontributory.  SOCIAL HISTORY:  The patient lives at home with her husband and daughter.  No alcohol use.  No tobacco use.  No IV drug use.  REVIEW OF SYSTEMS:  As per HPI, otherwise negative.  PHYSICAL EXAMINATION:  VITAL SIGNS:  Temperature 98.6; blood pressure 163/72, up to 191/87; pulse of 81; respirations 18; satting 100% on room air. GENERAL:  The patient is well-developed, well-nourished female, in no acute cardiopulmonary distress. HEENT:  Normocephalic, atraumatic.  Pupils equal, round, and reactive to light and accommodation.  Extraocular movements intact.  Oropharynx is clear.  No lesions, no exudates. NECK:  Supple.  No lymphadenopathy. RESPIRATORY:  Lungs are clear to auscultation bilaterally.  No wheezes, no crackles.  No rhonchi. CARDIOVASCULAR:  Regular rate and rhythm.  No murmurs, rubs, or gallops. ABDOMEN:  Soft, nontender, nondistended.  Positive bowel sounds. EXTREMITIES:  No clubbing, cyanosis, or edema. NEUROLOGIC:  The patient is alert and oriented x3.  Cranial nerves II through XII are grossly intact.  No focal deficits.  ADMISSION LABORATORY DATA:  Magnesium of 2.4.  BMET:  Sodium 139, potassium 3.6, chloride 94, bicarb 20, glucose 178, BUN 44, creatinine 6.95, calcium of 9.8.  UA is yellow, turbid, specific gravity 1.020, pH of 8, glucose negative,  bilirubin negative, ketones negative, blood large, protein 100, urobilinogen 0.2, nitrite negative, leukocytes large.  Urine microscopy, wbc's too numerous to count, rbc's 0-2, bacteria many.  CBC with a white count of 5.7, hemoglobin 13.6, hematocrit 40.1, and a platelet count of 165.  Acute abdominal series, no specific features to suggest bowel obstruction, no active cardiopulmonary abnormalities. EKG which was done shows a normal sinus rhythm.  ASSESSMENT AND PLAN:  [name] is an 77 year old African American female with history of end-stage renal disease, type 2 diabetes with gastroparesis, recent hospitalization in February 2012 for a sepsis secondary to methicillin-susceptible Staphylococcus aureus urinary tract infection presenting to the ED with a 3-day history of nausea and vomiting. 1. Nausea and vomiting, questionable etiology, likely secondary to     urinary tract infection versus gastroparesis versus a     gastroenteritis.  The patient did have a bowel movement today.     Acute abdominal series is negative.  We will place on antiemetics,     IV fluids, supportive care.  We will resume the patient's Reglan.We will follow, if no improvement may need a GI consult for further     evaluation.  We will place on a proton pump inhibitor and follow. 2. Urinary tract infections.  Check urine cultures, IV Levaquin. 3. End-stage renal disease, on hemodialysis.  We will consult Renal     for dialysis. 4. Type 2 diabetes with gastroparesis.  We will check a hemoglobin     A1c, place on home dose Lantus and sliding scale.  We will resume     the patient's Reglan. 5. Depression.  Lexapro. 6. Accelerated hypertension.  We will give a dose of labetalol 20 mg     IV push x1, then place on Norvasc 10 mg daily and labetalol as     needed. 7. Mild dementia. 8. Gastroesophageal reflux disease.  Proton pump inhibitor. 9. Hyperlipidemia.  Continue home dose Zocor. 10.Prophylaxis.   Protonix for GI prophylaxis.  Heparin for DVT     prophylaxis.  It has been a pleasure taking care of [name].     Ramiro Harvest, MD     DT/MEDQ  D:  10/27/2010  T:  10/27/2010  Job:  [id number]  cc:   Stacie Glaze, MD [facility]  Electronically Signed by Ramiro Harvest MD on 11/12/2010 02:38:52 PM

## 2010-11-25 ENCOUNTER — Encounter (HOSPITAL_COMMUNITY): Payer: Self-pay | Admitting: Radiology

## 2010-11-25 ENCOUNTER — Emergency Department (HOSPITAL_COMMUNITY): Payer: Medicare Other

## 2010-11-25 ENCOUNTER — Inpatient Hospital Stay (HOSPITAL_COMMUNITY)
Admission: EM | Admit: 2010-11-25 | Discharge: 2010-11-29 | DRG: 371 | Disposition: A | Discharge status: Home Health | Payer: Medicare Other | Attending: Hospitalist | Admitting: Hospitalist

## 2010-11-25 DIAGNOSIS — A0472 Enterocolitis due to Clostridium difficile, not specified as recurrent: Principal | ICD-10-CM | POA: Diagnosis present

## 2010-11-25 DIAGNOSIS — N2581 Secondary hyperparathyroidism of renal origin: Secondary | ICD-10-CM | POA: Diagnosis present

## 2010-11-25 DIAGNOSIS — E785 Hyperlipidemia, unspecified: Secondary | ICD-10-CM | POA: Diagnosis present

## 2010-11-25 DIAGNOSIS — N186 End stage renal disease: Secondary | ICD-10-CM | POA: Diagnosis present

## 2010-11-25 DIAGNOSIS — K219 Gastro-esophageal reflux disease without esophagitis: Secondary | ICD-10-CM | POA: Diagnosis present

## 2010-11-25 DIAGNOSIS — I120 Hypertensive chronic kidney disease with stage 5 chronic kidney disease or end stage renal disease: Secondary | ICD-10-CM | POA: Diagnosis present

## 2010-11-25 DIAGNOSIS — E119 Type 2 diabetes mellitus without complications: Secondary | ICD-10-CM | POA: Diagnosis present

## 2010-11-25 DIAGNOSIS — H409 Unspecified glaucoma: Secondary | ICD-10-CM | POA: Diagnosis present

## 2010-11-25 DIAGNOSIS — Z992 Dependence on renal dialysis: Secondary | ICD-10-CM

## 2010-11-25 LAB — COMPREHENSIVE METABOLIC PANEL
Alkaline Phosphatase: 246 U/L — ABNORMAL HIGH (ref 39–117)
BUN: 59 mg/dL — ABNORMAL HIGH (ref 6–23)
Creatinine, Ser: 7.33 mg/dL — ABNORMAL HIGH (ref 0.50–1.10)
GFR calc Af Amer: 6 mL/min — ABNORMAL LOW (ref >60)
Glucose, Bld: 218 mg/dL — ABNORMAL HIGH (ref 70–99)
Potassium: 4.7 mEq/L (ref 3.5–5.1)
Total Bilirubin: 0.5 mg/dL (ref 0.3–1.2)
Total Protein: 8.2 g/dL (ref 6.0–8.3)

## 2010-11-25 LAB — DIFFERENTIAL
Lymphocytes Relative: 16 % (ref 12–46)
Lymphs Abs: 0.8 10*3/uL (ref 0.7–4.0)
Monocytes Absolute: 0.3 10*3/uL (ref 0.1–1.0)
Monocytes Relative: 5 % (ref 3–12)
Neutro Abs: 4.1 10*3/uL (ref 1.7–7.7)
Neutrophils Relative %: 77 % (ref 43–77)

## 2010-11-25 LAB — CLOSTRIDIUM DIFFICILE BY PCR: Toxigenic C. Difficile by PCR: POSITIVE — AB

## 2010-11-25 LAB — LIPID PANEL
Cholesterol: 184 mg/dL (ref 0–200)
LDL Cholesterol: 120 mg/dL — ABNORMAL HIGH (ref 0–99)
Total CHOL/HDL Ratio: 3.5 RATIO
Triglycerides: 56 mg/dL (ref <150)
VLDL: 11 mg/dL (ref 0–40)

## 2010-11-25 LAB — LACTIC ACID, PLASMA: Lactic Acid, Venous: 2.3 mmol/L — ABNORMAL HIGH (ref 0.5–2.2)

## 2010-11-25 LAB — URINALYSIS, ROUTINE W REFLEX MICROSCOPIC
Bilirubin Urine: NEGATIVE
Glucose, UA: 100 mg/dL — AB
Ketones, ur: NEGATIVE mg/dL
Nitrite: NEGATIVE
Specific Gravity, Urine: 1.009 (ref 1.005–1.030)
pH: 8 (ref 5.0–8.0)

## 2010-11-25 LAB — URINE MICROSCOPIC-ADD ON

## 2010-11-25 LAB — CBC
HCT: 40.6 % (ref 36.0–46.0)
Hemoglobin: 14 g/dL (ref 12.0–15.0)
MCH: 28.1 pg (ref 26.0–34.0)
MCHC: 34.5 g/dL (ref 30.0–36.0)
MCV: 81.5 fL (ref 78.0–100.0)
RBC: 4.98 MIL/uL (ref 3.87–5.11)

## 2010-11-25 LAB — CK TOTAL AND CKMB (NOT AT ARMC)
CK, MB: 2.6 ng/mL (ref 0.3–4.0)
Relative Index: INVALID (ref 0.0–2.5)

## 2010-11-25 LAB — GLUCOSE, CAPILLARY: Glucose-Capillary: 139 mg/dL — ABNORMAL HIGH (ref 70–99)

## 2010-11-25 LAB — LIPASE, BLOOD: Lipase: 81 U/L — ABNORMAL HIGH (ref 11–59)

## 2010-11-25 MED ORDER — IOHEXOL 300 MG/ML  SOLN: 100.0000 mL | Freq: Once | INTRAMUSCULAR | Status: AC | PRN

## 2010-11-26 LAB — RENAL FUNCTION PANEL
BUN: 81 mg/dL — ABNORMAL HIGH (ref 6–23)
CO2: 23 mEq/L (ref 19–32)
Calcium: 9.3 mg/dL (ref 8.4–10.5)
Creatinine, Ser: 8.85 mg/dL — ABNORMAL HIGH (ref 0.50–1.10)
Glucose, Bld: 150 mg/dL — ABNORMAL HIGH (ref 70–99)

## 2010-11-26 LAB — GLUCOSE, CAPILLARY
Glucose-Capillary: 153 mg/dL — ABNORMAL HIGH (ref 70–99)
Glucose-Capillary: 146 mg/dL — ABNORMAL HIGH (ref 70–99)
Glucose-Capillary: 105 mg/dL — ABNORMAL HIGH (ref 70–99)

## 2010-11-26 LAB — CBC
MCV: 79.5 fL (ref 78.0–100.0)
Platelets: 156 10*3/uL (ref 150–400)
RBC: 4.48 MIL/uL (ref 3.87–5.11)
WBC: 7 10*3/uL (ref 4.0–10.5)

## 2010-11-26 LAB — GIARDIA/CRYPTOSPORIDIUM SCREEN(EIA): Giardia Screen - EIA: NEGATIVE

## 2010-11-26 LAB — URINE CULTURE: Culture  Setup Time: 201206260829

## 2010-11-27 ENCOUNTER — Inpatient Hospital Stay (HOSPITAL_COMMUNITY): Payer: Medicare Other

## 2010-11-27 LAB — RENAL FUNCTION PANEL
Calcium: 7.7 mg/dL — ABNORMAL LOW (ref 8.4–10.5)
Creatinine, Ser: 10.03 mg/dL — ABNORMAL HIGH (ref 0.50–1.10)
GFR calc Af Amer: 5 mL/min — ABNORMAL LOW (ref >60)
GFR calc non Af Amer: 4 mL/min — ABNORMAL LOW (ref >60)
Glucose, Bld: 111 mg/dL — ABNORMAL HIGH (ref 70–99)
Phosphorus: 4.1 mg/dL (ref 2.3–4.6)
Sodium: 134 mEq/L — ABNORMAL LOW (ref 135–145)

## 2010-11-27 LAB — CBC
MCH: 28.3 pg (ref 26.0–34.0)
MCHC: 36.2 g/dL — ABNORMAL HIGH (ref 30.0–36.0)
Platelets: 138 10*3/uL — ABNORMAL LOW (ref 150–400)

## 2010-11-28 LAB — RENAL FUNCTION PANEL
Albumin: 3 g/dL — ABNORMAL LOW (ref 3.5–5.2)
BUN: 31 mg/dL — ABNORMAL HIGH (ref 6–23)
Chloride: 97 mEq/L (ref 96–112)
Creatinine, Ser: 5.73 mg/dL — ABNORMAL HIGH (ref 0.50–1.10)
Glucose, Bld: 55 mg/dL — ABNORMAL LOW (ref 70–99)

## 2010-11-28 LAB — CBC
HCT: 36.3 % (ref 36.0–46.0)
Hemoglobin: 12.8 g/dL (ref 12.0–15.0)
MCV: 80.7 fL (ref 78.0–100.0)
RDW: 19.8 % — ABNORMAL HIGH (ref 11.5–15.5)
WBC: 4.9 10*3/uL (ref 4.0–10.5)

## 2010-11-28 LAB — GLUCOSE, CAPILLARY
Glucose-Capillary: 58 mg/dL — ABNORMAL LOW (ref 70–99)
Glucose-Capillary: 91 mg/dL (ref 70–99)
Glucose-Capillary: 101 mg/dL — ABNORMAL HIGH (ref 70–99)

## 2010-11-29 ENCOUNTER — Inpatient Hospital Stay (HOSPITAL_COMMUNITY): Payer: Medicare Other

## 2010-11-29 LAB — COMPREHENSIVE METABOLIC PANEL
ALT: 15 U/L (ref 0–35)
Alkaline Phosphatase: 163 U/L — ABNORMAL HIGH (ref 39–117)
CO2: 27 mEq/L (ref 19–32)
Chloride: 95 mEq/L — ABNORMAL LOW (ref 96–112)
GFR calc Af Amer: 7 mL/min — ABNORMAL LOW (ref >60)
GFR calc non Af Amer: 5 mL/min — ABNORMAL LOW (ref >60)
Glucose, Bld: 119 mg/dL — ABNORMAL HIGH (ref 70–99)
Potassium: 3.9 mEq/L (ref 3.5–5.1)
Sodium: 135 mEq/L (ref 135–145)
Total Protein: 7 g/dL (ref 6.0–8.3)

## 2010-12-18 NOTE — Discharge Summary (Signed)
NAMEAUBREANA, CORNACCHIA NO.:  [id number]  MEDICAL RECORD NO.:  [id number]  LOCATION:  5524                         FACILITY:  [facility]  PHYSICIAN:  Altha Harm, MDDATE OF BIRTH:  [birth year]  DATE OF ADMISSION:  11/25/2010 DATE OF DISCHARGE:  11/29/2010                              DISCHARGE SUMMARY   DISCHARGE/DISPOSITION:  Home.  FINAL DISCHARGE DIAGNOSES: 1. Clostridium difficile colitis. 2. Nausea, vomiting resolved. 3. Pyuria.  Urine culture negative. 4. End-stage renal disease, hemodialysis dependent on Tuesday,     Thursday, and Saturday.  SECONDARY DIAGNOSES: 1. Hypertension. 2. Diabetes type 2. 3. Gastroesophageal reflux disease. 4. Glaucoma. 5. Hyperlipidemia. 6. AV graft fistula on the right arm.  DISCHARGE MEDICATIONS:  Include the following: 1. Norvasc 10 mg p.o. nightly. 2. Zofran 4 mg p.o. q.6 h. p.r.n. nausea. 3. Crestor 10 mg p.o. daily. 4. Vancomycin 125 mg p.o. q.6 h. for 9 days. 5. Aspirin 81 mg p.o. nightly. 6. Cosopt ophthalmic solution 1 drop in both eyes b.i.d. 7. Dialysate over the counter 1 tablet p.o. daily. 8. Lantus SoloSTAR 5 units subcutaneously at bedtime. 9. Lexapro 10 mg one-half tablet p.o. daily. 10.Nexium 40 mg p.o. daily. 11.Quinine 324 mg p.o. Tuesday, Thursday, Saturday. 12.Ultram 50 mg p.o. b.i.d.  CONSULTANTS:  Nephrology.  PROCEDURES:  None.  DIAGNOSTIC STUDIES: 1. Acute abdominal x-rays which showed no evidence of active pulmonary     disease.  Nonobstructive bowel gas pattern. 2. CT abdomen and pelvis which showed scattered edema in the     perinephric spaces bilaterally without evidence of renal mass or     urinary tract obstruction.  Enhancement of gallbladder with an even     enhance and 13-mm diameter nodule versus a partially calcified     gallstones low gallbladder segment.  Diffuse bladder wall     thickening, may be related to infection, cannot exclude rectal wall      thickening.  PRIMARY CARE PHYSICIAN:  Stacie Glaze, MD  PRIMARY NEPHROLOGIST:  [facility].  CHIEF COMPLAINT:  Abdominal pain, nausea, vomiting, diarrhea.  HISTORY OF PRESENT ILLNESS:  Please refer to the H and P by Dr. Gwenlyn Perking for details of the HPI.  However in short, this is an 77 year old female with a history of diabetes, end-stage renal disease, dialysis dependent, history of recurrent UTIs who presented to the emergency room complaining of abdominal pain, nausea, vomiting, and diarrhea.  The patient was referred to [facility] further evaluation and management.  HOSPITAL COURSE: 1. Diarrhea.  The patient was tested for C-diff and found to be     positive.  She was empirically started on Flagyl.  The patient     however appeared to have nausea and vomiting associated with IV     intake of Flagyl.  This was discontinued and she was switched over     to oral vancomycin.  The patient had no further nausea and vomiting     since switching to the vancomycin and she will continue it for a     total of 14 days of therapy. 2. End-stage renal disease.  The patient was seen by Nephrology and  her dialysis instituted.  She is tolerating her dialysis well on     both days that she has had been here. 3. Diabetes type 2.  Blood sugars have been controlled on her usual     dose of Lantus. 4. Pyuria.  The patient had findings consistent with urinary tract     infection and was started empirically on antibiotics.  However, the     urine culture proved to be negative.  The patient received 3 days     of antibiotics and is on no further antibiotics beyond that.  Otherwise, the patient is stable and without any problems.  Today, her physical examination is follows: VITAL SIGNS:  Temperature is 98.4, blood pressure 148/68, heart rate 90, respirations 18, O2 sats are 100% on room air. HEENT:  She is normocephalic, atraumatic.  Pupils are equally round and reactive to light  and accommodation.  Extraocular movements are intact. Oropharynx, the patient is edentulous.  There is no exudate, erythema or lesions noted. NECK:  Trachea is midline.  No masses, no thyromegaly, no JVD, no carotid bruit. RESPIRATORY:  She has a normal respiratory effort.  Equal excursion bilaterally.  No wheezing or rhonchi noted. CARDIOVASCULAR: She has a normal S1 and S2.  No murmurs, rubs or gallops noted.  PMI is nondisplaced.  No heaves or thrills on palpation. ABDOMEN:  Obese, soft, nontender, nondistended.  No masses, no hepatosplenomegaly is noted. PSYCHIATRIC:  She is alert and oriented x3.  Good insight and cognition. Good recent and remote memory recall. NEUROLOGIC:  The patient has no focal neurological deficits.  Cranial nerves II-XII grossly intact.  DIETARY RESTRICTIONS:  The patient should be on a renal diet.  PHYSICAL RESTRICTIONS:  Activity as tolerated.  Total time for this discharge process including face-to-face time, approximately 39 minutes.     Altha Harm, MD     MAM/MEDQ  D:  11/29/2010  T:  11/30/2010  Job:  [id number]  cc:   Rosanna Randy, MD  Electronically Signed by Marthann Schiller MD on 12/18/2010 12:22:04 PM

## 2010-12-19 NOTE — H&P (Signed)
[name], HIGHLAND NO.:  [id number]  MEDICAL RECORD NO.:  [id number]  LOCATION:  [facility]                         FACILITY:  [facility]  PHYSICIAN:  Rosanna Randy, MDDATE OF BIRTH:  05-19-1929  DATE OF ADMISSION:  11/25/2010 DATE OF DISCHARGE:                             HISTORY & PHYSICAL   PRIMARY CARE PHYSICIAN:  Stacie Glaze, MD  CHIEF COMPLAINT:  Abdominal pain, nausea, vomiting, and diarrhea, unable to keep things down.  HISTORY OF PRESENT ILLNESS:  [name] is a 77 year old female with a past medical history significant for diabetes, hypertension, hyperlipidemia, end-stage renal disease on dialysis and depression also with history of recurrent UTIs who came into the hospital complaining of abdominal pain generalized associated with nausea and vomiting, unable to keep things down and also diarrhea.  The patient reports that she had been having this nausea, vomiting symptoms for the last 3 weeks approximately but they have become worse now and they had also been associated with abdominal pain and diarrhea which is new for her.  The patient endorses that she had been on antibiotics multiple times over the last 2 months including the beginning of June secondary to recurrent UTIs.  The patient denies fever, chest pain, shortness of breath, hematemesis, or hematochezia/melena.  She said later to me that she saw a little bit of bright red blood per rectum on her last bowel movement prior to admission.  The patient endorses positive chills.  ALLERGIES:  She is not allergic to any medication.  PAST MEDICAL HISTORY:  Significant for hypertension, diabetes, gastroesophageal reflux disease, glaucoma, hyperlipidemia, history of recurrent UTIs, end-stage renal disease, also with a history of hysterectomy and also on AVG graft fistula on her right arm.  MEDICATIONS: 1. The patient is on Zocor 80 mg 1 tablet by mouth every evening. 2. Ultram 50 mg 1 tablet  p.o. twice a day. 3. Quinidine 324 mg 1 capsule every Tuesday, Thursday, and Saturday. 4. Nexium 40 mg 1 capsule daily. 5. Lexapro 10 mg to take 1/2 tablet by mouth daily. 6. Lantus 5 units subcutaneously at bedtime. 7. Dialyvite over-the-counter 1 tablet daily. 8. Timolol/dorzolamide ophthalmic solution 2/0.5%, 1 drop in both eyes     twice a day. 9. Aspirin 81 mg by mouth daily. 10.Renagel 1600 mg twice a day.  SOCIAL HISTORY:  The patient lives at home with her husband and daughter.  There is no alcohol, tobacco, or illicit drug use.  FAMILY HISTORY:  Noncontributory.  REVIEW OF SYSTEMS:  As per HPI, otherwise negative.  PHYSICAL EXAMINATION:  VITAL SIGNS:  Temperature 98.3, blood pressure 176/64, heart rate 91, respiratory rate 18, oxygen saturation 100% on room air. GENERAL:  The patient appears to be well-developed in mild distress secondary to her abdominal discomfort.  She is cooperative to examination and is alert, awake, and oriented x3. HEENT:  Normocephalic.  No trauma.  Eyes:  PERLA.  Extraocular muscles intact.  Oropharynx is clear.  There is no exudate.  No erythema.  No ulcers.  Mucous membranes are dry. NECK:  Supple.  No lymphadenopathy, thyromegaly, or bruits. RESPIRATORY SYSTEM:  Lungs are clear to auscultation bilaterally. CARDIOVASCULAR:  Regular rate  and rhythm.  No murmurs, gallops, or rubs. ABDOMEN:  Soft with diffuse tenderness, specifically mid epigastric region.  There is no distention appreciated.  Positive bowel sounds. EXTREMITIES:  No clubbing, cyanosis, or edema. NEUROLOGIC:  The patient was alert, awake, and oriented x3.  Cranial nerves II-XII grossly intact.  There was no focal neurologic deficit. Muscle strength was 4/5 bilateral symmetrical secondary to poor effort. The patient replied that she is feeling a little bit weak and tired.  LABORATORY DATA:  We have a CBC with differential that shows white blood cells of 5.2, hemoglobin 14.0,  platelets 159,000, lactic acid 2.3. Urinalysis with a cloudy appearance, specific gravity 1.009, negative nitrites, moderate leukocytes, 100 protein, microscopy 21-50 white blood cells, few bacteria.  Troponin less than 0.30.  Complete metabolic panel demonstrated a sodium of 134, potassium 4.7, chloride 94, bicarb 21, glucose 218, BUN 59, creatinine 7.33.  The patient's LFTs are normal except for an alkaline phosphatase up to 246, CK-MB and CK total were within normal limits with a CK total at 87 and a CK-MB at 2.6, lipase was 81.  RADIOLOGIC IMAGES:  The patient had abdominal x-ray that demonstrated no evidence of active pulmonary disease and a known obstructive bowel gas pattern.  There is also an order for a CT of her abdomen but the patient has not finished the contrast and the study is not done at this moment.  ASSESSMENT AND PLAN: 1. Abdominal pain with elevated lipase.  Even that this could     potentially represent mild pancreatitis, the patient has been using     plenty of antibiotics recently and is also complaining of diarrhea     associated with this abdominal pain, nausea, and vomiting.  There     is a high concern for this multiple antibiotic use for C. diff.  At     this moment, we are going to admit the patient.  We are going to     provide bowel rest.  We are going to give gentle fluid     resuscitation and we are going to check stool cultures for C. diff     and also Campylobacter, Escherichia coli, Shigella.  We are going     to put the patient on contact precautions and we are going to start     treatment empirically with metronidazole. 2. Nausea, vomiting, and diarrhea with dehydration and mild elevation     of the lactic acid.  We are going to start fluid resuscitation     gentle due to her end-stage renal disease status and we are going     to provide p.r.n. antiemetics. 3. Pyuria/abnormal urinalysis.  She had received plenty of treatment     for her urinary  tract infection and is currently not complaining of     dysuria.  There is no fever and no elevation of the white blood     cells.  She received 1 dose of Rocephin in the emergency department     and a culture was sent to the laboratory before giving the     antibiotics.  We are going to hold off on any further antibiotics     for her questionable urinary tract infection until we have the     cultures back because the patient might just have pyuria and not     really having any infection in her urine at this point. 4. Diabetes.  We are going to use sliding scale and Lantus. 5.  Hyperlipidemia.  We are going to check a fasting lipid profile and     we are going to continue statins. 6. Depression.  We are going to continue Lexapro. 7. End-stage renal disease on hemodialysis Tuesday, Thursday, and     Saturday.  We are going to consult Renal in order to provide     continuation of hemodialysis during this hospitalization. 8. Hypertension.  The patient is currently not using any medication     and her blood pressure is pretty much just controlled on dialysis.     On admission, her blood pressure is with a systolic, pulse 180, so     we are going to start a low dose of Norvasc 5 mg by mouth daily and     we are going to continue her hemodialysis. 9. Glaucoma.  We are going to continue the use of her ophthalmic eye     drops which is timolol/dorzolamide twice a day. 10.History of anemia of chronic disease with intermittent use of iron     during her hemodialysis.  Hemoglobin stable at this point, MCV also     within normal range.  We are going to continue to monitor her. 11.Deep venous thrombosis prophylaxis.  We are going to use Lovenox. 12.Gastroesophageal reflux disease.  For that we are going to start     the patient on Mylanta.  We are going to hold off on the PPI at     this point.  We are going to rule out C. diff.    Further treatment and evaluation depending overall evolution of this  patient  during the hospitalization and also results of the tests that have been ordered.     Rosanna Randy, MD     CEM/MEDQ  D:  11/25/2010  T:  11/25/2010  Job:  [id number]  cc:   Stacie Glaze, MD  Electronically Signed by Vassie Loll MD on 12/19/2010 04:34:05 PM

## 2010-12-22 ENCOUNTER — Ambulatory Visit: Payer: Self-pay | Admitting: Vascular Surgery

## 2010-12-26 ENCOUNTER — Other Ambulatory Visit: Payer: Self-pay | Admitting: Internal Medicine

## 2010-12-29 ENCOUNTER — Encounter: Payer: Self-pay | Admitting: Internal Medicine

## 2010-12-29 ENCOUNTER — Ambulatory Visit (INDEPENDENT_AMBULATORY_CARE_PROVIDER_SITE_OTHER): Payer: Medicare Other | Admitting: Internal Medicine

## 2010-12-29 DIAGNOSIS — N186 End stage renal disease: Secondary | ICD-10-CM

## 2010-12-29 DIAGNOSIS — I10 Essential (primary) hypertension: Secondary | ICD-10-CM

## 2010-12-29 DIAGNOSIS — E119 Type 2 diabetes mellitus without complications: Secondary | ICD-10-CM

## 2010-12-29 DIAGNOSIS — K219 Gastro-esophageal reflux disease without esophagitis: Secondary | ICD-10-CM

## 2010-12-29 NOTE — Progress Notes (Signed)
Subjective:    Patient ID: [name], female    DOB: 1929/02/07, 77 y.o.   MRN: [id number]  HPI Patient is a 77 year old American female who has end-stage renal disease on dialysis and diabetes with chronic gastroesophageal reflux and hyperlipidemia.  She was recently hospitalized for a viral gastroenteritis with nausea vomiting diarrhea and dehydration she was discharged from the hospital without any complications.  She has also had a recent problem with her access on her right arm she required revascularization of the accessible to the general surgeon the procedure was done as a single stitch remaining but she has a followup on August 15 for removal of the stitch.  We examined the site there is an excellent thrill and palpable fistula.     Review of Systems  Constitutional: Positive for unexpected weight change. Negative for activity change, appetite change and fatigue.  HENT: Negative for ear pain, congestion, neck pain, postnasal drip and sinus pressure.   Eyes: Negative for redness and visual disturbance.  Respiratory: Negative for cough, shortness of breath and wheezing.   Gastrointestinal: Negative for abdominal pain and abdominal distention.  Genitourinary: Negative for dysuria, frequency and menstrual problem.  Musculoskeletal: Negative for myalgias, joint swelling and arthralgias.  Skin: Negative for rash and wound.  Neurological: Negative for dizziness, weakness and headaches.  Hematological: Negative for adenopathy. Does not bruise/bleed easily.  Psychiatric/Behavioral: Negative for sleep disturbance and decreased concentration.   Past Medical History  Diagnosis Date  . Cough   . Diabetes mellitus     type 2  . GERD (gastroesophageal reflux disease)   . Hypertension   . Bronchitis   . Osteoarthritis   . End stage renal disease   . Hemodialysis patient     esrd   Past Surgical History  Procedure Date  . Eye surgery     Cataract extraction    reports that  she has never smoked. She does not have any smokeless tobacco history on file. She reports that she does not drink alcohol or use illicit drugs. family history includes Hypertension in her mother. No Known Allergies     Objective:   Physical Exam  Nursing note and vitals reviewed. Constitutional: She is oriented to person, place, and time. She appears well-developed and well-nourished. No distress.  HENT:  Head: Normocephalic and atraumatic.  Right Ear: External ear normal.  Left Ear: External ear normal.  Nose: Nose normal.  Mouth/Throat: Oropharynx is clear and moist.  Eyes: Conjunctivae and EOM are normal. Pupils are equal, round, and reactive to light.  Neck: Normal range of motion. Neck supple. No JVD present. No tracheal deviation present. No thyromegaly present.  Cardiovascular: Normal rate, regular rhythm, normal heart sounds and intact distal pulses.   No murmur heard. Pulmonary/Chest: Effort normal and breath sounds normal. She has no wheezes. She exhibits no tenderness.  Abdominal: Soft. Bowel sounds are normal.  Musculoskeletal: Normal range of motion. She exhibits no edema and no tenderness.  Lymphadenopathy:    She has no cervical adenopathy.  Neurological: She is alert and oriented to person, place, and time. She has normal reflexes. No cranial nerve deficit.  Skin: Skin is warm and dry. She is not diaphoretic.  Psychiatric: She has a normal mood and affect. Her behavior is normal.          Assessment & Plan:  The patient was seen in the hospital for viral gastroenteritis has since recovered blood pressure and appearance show resolution of her symptomatology.  She has  however lost weight we discussed her weight loss the use of dietary supplements such as Ensure daily but believes that the weight loss that she has experienced has actually helped her to ambulate better and transferred better.  She is still above her ideal weight therefore we reassured the family  that this weight loss was not inappropriate.  We reviewed her blood sugars the A1c done at the dialysis center and her symptomology with her gastroesophageal reflux we'll see her again in 3 months No problem-specific assessment & plan notes found for this encounter.

## 2011-02-04 ENCOUNTER — Ambulatory Visit: Payer: Self-pay | Admitting: Vascular Surgery

## 2011-03-16 ENCOUNTER — Ambulatory Visit: Payer: Self-pay | Admitting: Vascular Surgery

## 2011-03-18 ENCOUNTER — Other Ambulatory Visit: Payer: Self-pay | Admitting: Internal Medicine

## 2011-04-04 ENCOUNTER — Ambulatory Visit: Payer: Self-pay | Admitting: Vascular Surgery

## 2011-04-28 ENCOUNTER — Other Ambulatory Visit: Payer: Self-pay | Admitting: *Deleted

## 2011-04-28 MED ORDER — QUININE SULFATE 324 MG PO CAPS: 648.0000 mg | ORAL_CAPSULE | ORAL | Status: DC | PRN

## 2011-05-01 ENCOUNTER — Ambulatory Visit (INDEPENDENT_AMBULATORY_CARE_PROVIDER_SITE_OTHER): Payer: Medicare Other | Admitting: Internal Medicine

## 2011-05-01 ENCOUNTER — Encounter: Payer: Self-pay | Admitting: Internal Medicine

## 2011-05-01 VITALS — BP 118/78 | HR 72 | Temp 98.2°F | Resp 16 | Ht 62.0 in | Wt 147.0 lb

## 2011-05-01 DIAGNOSIS — N19 Unspecified kidney failure: Secondary | ICD-10-CM

## 2011-05-01 DIAGNOSIS — R634 Abnormal weight loss: Secondary | ICD-10-CM

## 2011-05-01 DIAGNOSIS — N186 End stage renal disease: Secondary | ICD-10-CM

## 2011-05-01 DIAGNOSIS — E119 Type 2 diabetes mellitus without complications: Secondary | ICD-10-CM

## 2011-05-01 DIAGNOSIS — E441 Mild protein-calorie malnutrition: Secondary | ICD-10-CM

## 2011-05-01 NOTE — Patient Instructions (Signed)
The patient is instructed to continue all medications as prescribed. Schedule followup with check out clerk upon leaving the clinic Obtain  muscle milk any flavor that you like. Add one scoop to a glass of soy milk or almond milk with a half a banana and ice and blend into a milk shake She may have one of these every other day

## 2011-05-01 NOTE — Progress Notes (Signed)
Subjective:    Patient ID: [name], female    DOB: [birth year], 77 y.o.   MRN: [id number]  HPI Assessment doing relatively well she is on hemodialysis and has been stable.  Her laboratory values revealed protein calorie malnutrition and she has been losing weight we discussed dietary supplementations and strategies for renal appropriate protein increase in her diet.  She has adult-onset diabetes and this has been stable we reviewed the laboratory work obtained at hemodialysis   Review of Systems  Constitutional: Positive for unexpected weight change. Negative for activity change, appetite change and fatigue.  HENT: Negative for ear pain, congestion, neck pain, postnasal drip and sinus pressure.   Eyes: Negative for redness and visual disturbance.  Respiratory: Negative for cough, shortness of breath and wheezing.   Gastrointestinal: Negative for abdominal pain and abdominal distention.  Genitourinary: Negative for dysuria, frequency and menstrual problem.  Musculoskeletal: Negative for myalgias, joint swelling and arthralgias.  Skin: Negative for rash and wound.  Neurological: Negative for dizziness, weakness and headaches.  Hematological: Negative for adenopathy. Does not bruise/bleed easily.  Psychiatric/Behavioral: Negative for sleep disturbance and decreased concentration.   Past Medical History  Diagnosis Date  . Cough   . Diabetes mellitus     type 2  . GERD (gastroesophageal reflux disease)   . Hypertension   . Bronchitis   . Osteoarthritis   . End stage renal disease   . Hemodialysis patient     esrd    History   Social History  . Marital Status: Married    Spouse Name: N/A    Number of Children: N/A  . Years of Education: N/A   Occupational History  . Not on file.   Social History Main Topics  . Smoking status: Never Smoker   . Smokeless tobacco: Not on file  . Alcohol Use: No  . Drug Use: No  . Sexually Active: No   Other Topics Concern  . Not  on file   Social History Narrative  . No narrative on file    Past Surgical History  Procedure Date  . Eye surgery     Cataract extraction    Family History  Problem Relation Age of Onset  . Hypertension Mother     No Known Allergies  Current Outpatient Prescriptions on File Prior to Visit  Medication Sig Dispense Refill  . Acetaminophen (TYLENOL EXTRA STRENGTH) 167 MG/5ML LIQD Take by mouth as needed.        . Dorzolamide HCl-Timolol Mal (COSOPT OP) Apply to eye daily.        [name] esomeprazole (NEXIUM) 40 MG capsule Take 40 mg by mouth daily.        . folic acid-vitamin b complex-vitamin c-selenium-zinc (DIALYVITE) 3 MG TABS Take 1 tablet by mouth daily.        . insulin glargine (LANTUS) 100 UNIT/ML injection Inject 5 Units into the skin at bedtime.       [name] levocetirizine (XYZAL) 5 MG tablet Take 5 mg by mouth daily. 1/2 once daily       . LEXAPRO 10 MG tablet TAKE ONE-HALF TABLET BY MOUTH EVERY DAY  30 each  6  . ondansetron (ZOFRAN) 4 MG tablet Take 4 mg by mouth every 6 (six) hours as needed. For nausea and vomitting       . quiNINE (QUALAQUIN) 324 MG capsule Take 2 capsules (648 mg total) by mouth as needed. For leg cramps  30 capsule  1  . sevelamer (RENAGEL)  800 MG tablet Take 800 mg by mouth daily.        . simvastatin (ZOCOR) 80 MG tablet TAKE ONE TABLET DAILY  30 tablet  2  . traMADol (ULTRAM) 50 MG tablet Take 1 tablet (50 mg total) by mouth every 6 (six) hours as needed for pain.  20 tablet  0    BP 118/78  Pulse 72  Temp 98.2 F (36.8 C)  Resp 16  Ht 5\' 2"  (1.575 m)  Wt 147 lb (66.679 kg)  BMI 26.89 kg/m2       Objective:   Physical Exam  Nursing note and vitals reviewed. Constitutional: She is oriented to person, place, and time. She appears well-developed and well-nourished. No distress.  HENT:  Head: Normocephalic and atraumatic.  Right Ear: External ear normal.  Left Ear: External ear normal.  Nose: Nose normal.  Mouth/Throat: Oropharynx is clear  and moist.  Eyes: Conjunctivae and EOM are normal. Pupils are equal, round, and reactive to light.  Neck: Normal range of motion. Neck supple. No JVD present. No tracheal deviation present. No thyromegaly present.  Cardiovascular: Normal rate, regular rhythm, normal heart sounds and intact distal pulses.   No murmur heard. Pulmonary/Chest: Effort normal and breath sounds normal. She has no wheezes. She exhibits no tenderness.  Abdominal: Soft. Bowel sounds are normal.  Musculoskeletal: Normal range of motion. She exhibits no edema and no tenderness.  Lymphadenopathy:    She has no cervical adenopathy.  Neurological: She is alert and oriented to person, place, and time. She has normal reflexes. No cranial nerve deficit.  Skin: Skin is warm and dry. She is not diaphoretic.  Psychiatric: She has a normal mood and affect. Her behavior is normal.          Assessment & Plan:  The patient has end-stage renal disease and is on dialysis her mental status has been Her spirits have been good she has been tolerating her dialysis well.  The patient has protein calorie malnutrition with markedly decreased albumin we discussed potential supplementation gave her a recipe for protein shake he is every other day  Patient's diabetes has been stable

## 2011-06-04 DIAGNOSIS — N186 End stage renal disease: Secondary | ICD-10-CM | POA: Diagnosis not present

## 2011-06-04 DIAGNOSIS — D631 Anemia in chronic kidney disease: Secondary | ICD-10-CM | POA: Diagnosis not present

## 2011-06-04 DIAGNOSIS — D509 Iron deficiency anemia, unspecified: Secondary | ICD-10-CM | POA: Diagnosis not present

## 2011-06-04 DIAGNOSIS — N2581 Secondary hyperparathyroidism of renal origin: Secondary | ICD-10-CM | POA: Diagnosis not present

## 2011-06-22 DIAGNOSIS — E11359 Type 2 diabetes mellitus with proliferative diabetic retinopathy without macular edema: Secondary | ICD-10-CM | POA: Diagnosis not present

## 2011-06-22 DIAGNOSIS — H4011X0 Primary open-angle glaucoma, stage unspecified: Secondary | ICD-10-CM | POA: Diagnosis not present

## 2011-06-22 DIAGNOSIS — E1139 Type 2 diabetes mellitus with other diabetic ophthalmic complication: Secondary | ICD-10-CM | POA: Diagnosis not present

## 2011-06-25 DIAGNOSIS — T7589XA Other specified effects of external causes, initial encounter: Secondary | ICD-10-CM | POA: Diagnosis not present

## 2011-06-25 DIAGNOSIS — N186 End stage renal disease: Secondary | ICD-10-CM | POA: Diagnosis not present

## 2011-06-25 DIAGNOSIS — Z7721 Contact with and (suspected) exposure to potentially hazardous body fluids: Secondary | ICD-10-CM | POA: Diagnosis not present

## 2011-06-25 DIAGNOSIS — E1129 Type 2 diabetes mellitus with other diabetic kidney complication: Secondary | ICD-10-CM | POA: Diagnosis not present

## 2011-07-02 DIAGNOSIS — N186 End stage renal disease: Secondary | ICD-10-CM | POA: Diagnosis not present

## 2011-07-04 DIAGNOSIS — N2581 Secondary hyperparathyroidism of renal origin: Secondary | ICD-10-CM | POA: Diagnosis not present

## 2011-07-04 DIAGNOSIS — D509 Iron deficiency anemia, unspecified: Secondary | ICD-10-CM | POA: Diagnosis not present

## 2011-07-04 DIAGNOSIS — N186 End stage renal disease: Secondary | ICD-10-CM | POA: Diagnosis not present

## 2011-07-04 DIAGNOSIS — D631 Anemia in chronic kidney disease: Secondary | ICD-10-CM | POA: Diagnosis not present

## 2011-07-13 DIAGNOSIS — E1059 Type 1 diabetes mellitus with other circulatory complications: Secondary | ICD-10-CM | POA: Diagnosis not present

## 2011-07-13 DIAGNOSIS — I739 Peripheral vascular disease, unspecified: Secondary | ICD-10-CM | POA: Diagnosis not present

## 2011-07-13 DIAGNOSIS — L84 Corns and callosities: Secondary | ICD-10-CM | POA: Diagnosis not present

## 2011-07-13 DIAGNOSIS — L608 Other nail disorders: Secondary | ICD-10-CM | POA: Diagnosis not present

## 2011-07-30 DIAGNOSIS — N186 End stage renal disease: Secondary | ICD-10-CM | POA: Diagnosis not present

## 2011-08-01 DIAGNOSIS — N186 End stage renal disease: Secondary | ICD-10-CM | POA: Diagnosis not present

## 2011-08-01 DIAGNOSIS — D509 Iron deficiency anemia, unspecified: Secondary | ICD-10-CM | POA: Diagnosis not present

## 2011-08-01 DIAGNOSIS — N2581 Secondary hyperparathyroidism of renal origin: Secondary | ICD-10-CM | POA: Diagnosis not present

## 2011-08-01 DIAGNOSIS — D631 Anemia in chronic kidney disease: Secondary | ICD-10-CM | POA: Diagnosis not present

## 2011-08-05 DIAGNOSIS — T82898A Other specified complication of vascular prosthetic devices, implants and grafts, initial encounter: Secondary | ICD-10-CM | POA: Diagnosis not present

## 2011-08-05 DIAGNOSIS — N186 End stage renal disease: Secondary | ICD-10-CM | POA: Diagnosis not present

## 2011-08-05 DIAGNOSIS — E119 Type 2 diabetes mellitus without complications: Secondary | ICD-10-CM | POA: Diagnosis not present

## 2011-08-10 DIAGNOSIS — E1059 Type 1 diabetes mellitus with other circulatory complications: Secondary | ICD-10-CM | POA: Diagnosis not present

## 2011-08-30 DIAGNOSIS — N186 End stage renal disease: Secondary | ICD-10-CM | POA: Diagnosis not present

## 2011-09-01 DIAGNOSIS — N2581 Secondary hyperparathyroidism of renal origin: Secondary | ICD-10-CM | POA: Diagnosis not present

## 2011-09-01 DIAGNOSIS — D631 Anemia in chronic kidney disease: Secondary | ICD-10-CM | POA: Diagnosis not present

## 2011-09-01 DIAGNOSIS — D509 Iron deficiency anemia, unspecified: Secondary | ICD-10-CM | POA: Diagnosis not present

## 2011-09-01 DIAGNOSIS — N186 End stage renal disease: Secondary | ICD-10-CM | POA: Diagnosis not present

## 2011-09-02 ENCOUNTER — Encounter: Payer: Self-pay | Admitting: Internal Medicine

## 2011-09-02 ENCOUNTER — Ambulatory Visit (INDEPENDENT_AMBULATORY_CARE_PROVIDER_SITE_OTHER): Payer: Medicare Other | Admitting: Internal Medicine

## 2011-09-02 VITALS — BP 112/76 | HR 72 | Temp 98.2°F | Resp 18 | Ht 60.0 in | Wt 147.0 lb

## 2011-09-02 DIAGNOSIS — E785 Hyperlipidemia, unspecified: Secondary | ICD-10-CM | POA: Diagnosis not present

## 2011-09-02 DIAGNOSIS — N189 Chronic kidney disease, unspecified: Secondary | ICD-10-CM

## 2011-09-02 LAB — LIPID PANEL
HDL: 42.9 mg/dL (ref >39.00)
Total CHOL/HDL Ratio: 3

## 2011-09-02 NOTE — Patient Instructions (Signed)
The patient is instructed to continue all medications as prescribed. Schedule followup with check out clerk upon leaving the clinic  

## 2011-09-02 NOTE — Progress Notes (Signed)
Subjective:    Patient ID: [name], female    DOB: [birth year], 78 y.o.   MRN: [id number]  HPI Physical Is an 78 year old African American Female Who Is Followed for Diabetes Gastroesophageal Reflux and a History of Hyperlipidemia. She Has End-Stage Renal Disease and Receives Dialysis 3 Times A Week at Dialysis Center Has Been Monitoring Her Diabetes Her CBC and Her Metabolic Panels and Has Made Appropriate Adjustments in Her Medication As Needed.  I Do Not See a Record However of Any Lipid Panels That Have Been Monitored and She Continues to Take a Statin Drug   Review of Systems  Constitutional: Negative for activity change, appetite change and fatigue.  HENT: Negative for ear pain, congestion, neck pain, postnasal drip and sinus pressure.   Eyes: Negative for redness and visual disturbance.  Respiratory: Negative for cough, shortness of breath and wheezing.   Gastrointestinal: Negative for abdominal pain and abdominal distention.  Genitourinary: Negative for dysuria, frequency and menstrual problem.  Musculoskeletal: Negative for myalgias, joint swelling and arthralgias.  Skin: Negative for rash and wound.  Neurological: Negative for dizziness, weakness and headaches.  Hematological: Negative for adenopathy. Does not bruise/bleed easily.  Psychiatric/Behavioral: Negative for sleep disturbance and decreased concentration.       Past Medical History  Diagnosis Date  . Cough   . Diabetes mellitus     type 2  . GERD (gastroesophageal reflux disease)   . Hypertension   . Bronchitis   . Osteoarthritis   . End stage renal disease   . Hemodialysis patient     esrd    History   Social History  . Marital Status: Married    Spouse Name: N/A    Number of Children: N/A  . Years of Education: N/A   Occupational History  . Not on file.   Social History Main Topics  . Smoking status: Never Smoker   . Smokeless tobacco: Not on file  . Alcohol Use: No  . Drug Use: No  .  Sexually Active: No   Other Topics Concern  . Not on file   Social History Narrative  . No narrative on file    Past Surgical History  Procedure Date  . Eye surgery     Cataract extraction    Family History  Problem Relation Age of Onset  . Hypertension Mother     No Known Allergies  Current Outpatient Prescriptions on File Prior to Visit  Medication Sig Dispense Refill  . Acetaminophen (TYLENOL EXTRA STRENGTH) 167 MG/5ML LIQD Take by mouth as needed.        . Dorzolamide HCl-Timolol Mal (COSOPT OP) Apply to eye daily.        [name] esomeprazole (NEXIUM) 40 MG capsule Take 40 mg by mouth daily.        . folic acid-vitamin b complex-vitamin c-selenium-zinc (DIALYVITE) 3 MG TABS Take 1 tablet by mouth daily.        . insulin glargine (LANTUS) 100 UNIT/ML injection Inject 5 Units into the skin at bedtime.       [name] levocetirizine (XYZAL) 5 MG tablet Take 5 mg by mouth daily. 1/2 once daily       . LEXAPRO 10 MG tablet TAKE ONE-HALF TABLET BY MOUTH EVERY DAY  30 each  6  . ondansetron (ZOFRAN) 4 MG tablet Take 4 mg by mouth every 6 (six) hours as needed. For nausea and vomitting       . quiNINE (QUALAQUIN) 324 MG capsule Take 2  capsules (648 mg total) by mouth as needed. For leg cramps  30 capsule  1  . sevelamer (RENAGEL) 800 MG tablet Take 800 mg by mouth daily.        . simvastatin (ZOCOR) 80 MG tablet TAKE ONE TABLET DAILY  30 tablet  2    BP 112/76  Pulse 72  Temp 98.2 F (36.8 C)  Resp 18  Ht 5' (1.524 m)  Wt 147 lb (66.679 kg)  BMI 28.71 kg/m2    Objective:   Physical Exam  Vitals reviewed. Constitutional: She is oriented to person, place, and time. She appears well-developed and well-nourished. No distress.  HENT:  Head: Normocephalic and atraumatic.  Right Ear: External ear normal.  Left Ear: External ear normal.  Nose: Nose normal.  Mouth/Throat: Oropharynx is clear and moist.  Eyes: Conjunctivae and EOM are normal. Pupils are equal, round, and reactive to  light.  Neck: Normal range of motion. Neck supple. No JVD present. No tracheal deviation present. No thyromegaly present.  Cardiovascular: Normal rate, regular rhythm, normal heart sounds and intact distal pulses.   No murmur heard. Pulmonary/Chest: Effort normal and breath sounds normal. She has no wheezes. She exhibits no tenderness.  Abdominal: Soft. Bowel sounds are normal.  Musculoskeletal: Normal range of motion. She exhibits no edema and no tenderness.  Lymphadenopathy:    She has no cervical adenopathy.  Neurological: She is alert and oriented to person, place, and time. She has normal reflexes. No cranial nerve deficit.  Skin: Skin is warm and dry. She is not diaphoretic.  Psychiatric: She has a normal mood and affect. Her behavior is normal.          Assessment & Plan:  She is extremely stable from the standpoint of her mental status she is communicative oriented to person place and time.  She states her dialysis is going well her blood pressure is well-controlled she denies any chest pain or orthostatic blood pressure changes.  Gastroesophageal reflux is stable and she reports that her dialysis and her tells her that her diabetes has been stable.  We will monitor a lipid panel today to assess her lipid control on her current medications

## 2011-09-07 ENCOUNTER — Other Ambulatory Visit: Payer: Self-pay | Admitting: Internal Medicine

## 2011-09-24 DIAGNOSIS — N186 End stage renal disease: Secondary | ICD-10-CM | POA: Diagnosis not present

## 2011-09-24 DIAGNOSIS — E1129 Type 2 diabetes mellitus with other diabetic kidney complication: Secondary | ICD-10-CM | POA: Diagnosis not present

## 2011-09-29 DIAGNOSIS — N186 End stage renal disease: Secondary | ICD-10-CM | POA: Diagnosis not present

## 2011-10-01 ENCOUNTER — Other Ambulatory Visit: Payer: Self-pay | Admitting: Internal Medicine

## 2011-10-01 DIAGNOSIS — N186 End stage renal disease: Secondary | ICD-10-CM | POA: Diagnosis not present

## 2011-10-01 DIAGNOSIS — N2581 Secondary hyperparathyroidism of renal origin: Secondary | ICD-10-CM | POA: Diagnosis not present

## 2011-10-01 DIAGNOSIS — D509 Iron deficiency anemia, unspecified: Secondary | ICD-10-CM | POA: Diagnosis not present

## 2011-10-05 DIAGNOSIS — I739 Peripheral vascular disease, unspecified: Secondary | ICD-10-CM | POA: Diagnosis not present

## 2011-10-05 DIAGNOSIS — E1059 Type 1 diabetes mellitus with other circulatory complications: Secondary | ICD-10-CM | POA: Diagnosis not present

## 2011-10-05 DIAGNOSIS — L608 Other nail disorders: Secondary | ICD-10-CM | POA: Diagnosis not present

## 2011-10-08 DIAGNOSIS — I669 Occlusion and stenosis of unspecified cerebral artery: Secondary | ICD-10-CM | POA: Diagnosis not present

## 2011-10-27 DIAGNOSIS — N390 Urinary tract infection, site not specified: Secondary | ICD-10-CM | POA: Diagnosis not present

## 2011-10-30 ENCOUNTER — Other Ambulatory Visit: Payer: Self-pay | Admitting: Internal Medicine

## 2011-10-30 DIAGNOSIS — N186 End stage renal disease: Secondary | ICD-10-CM | POA: Diagnosis not present

## 2011-10-31 DIAGNOSIS — D631 Anemia in chronic kidney disease: Secondary | ICD-10-CM | POA: Diagnosis not present

## 2011-10-31 DIAGNOSIS — N2581 Secondary hyperparathyroidism of renal origin: Secondary | ICD-10-CM | POA: Diagnosis not present

## 2011-10-31 DIAGNOSIS — D509 Iron deficiency anemia, unspecified: Secondary | ICD-10-CM | POA: Diagnosis not present

## 2011-10-31 DIAGNOSIS — E209 Hypoparathyroidism, unspecified: Secondary | ICD-10-CM | POA: Diagnosis not present

## 2011-10-31 DIAGNOSIS — N186 End stage renal disease: Secondary | ICD-10-CM | POA: Diagnosis not present

## 2011-11-18 ENCOUNTER — Other Ambulatory Visit: Payer: Self-pay | Admitting: Internal Medicine

## 2011-11-29 DIAGNOSIS — N186 End stage renal disease: Secondary | ICD-10-CM | POA: Diagnosis not present

## 2011-12-01 DIAGNOSIS — N186 End stage renal disease: Secondary | ICD-10-CM | POA: Diagnosis not present

## 2011-12-01 DIAGNOSIS — D509 Iron deficiency anemia, unspecified: Secondary | ICD-10-CM | POA: Diagnosis not present

## 2011-12-01 DIAGNOSIS — N2581 Secondary hyperparathyroidism of renal origin: Secondary | ICD-10-CM | POA: Diagnosis not present

## 2011-12-01 DIAGNOSIS — D631 Anemia in chronic kidney disease: Secondary | ICD-10-CM | POA: Diagnosis not present

## 2011-12-14 DIAGNOSIS — L84 Corns and callosities: Secondary | ICD-10-CM | POA: Diagnosis not present

## 2011-12-14 DIAGNOSIS — I739 Peripheral vascular disease, unspecified: Secondary | ICD-10-CM | POA: Diagnosis not present

## 2011-12-14 DIAGNOSIS — L608 Other nail disorders: Secondary | ICD-10-CM | POA: Diagnosis not present

## 2011-12-14 DIAGNOSIS — E1059 Type 1 diabetes mellitus with other circulatory complications: Secondary | ICD-10-CM | POA: Diagnosis not present

## 2011-12-21 DIAGNOSIS — E11359 Type 2 diabetes mellitus with proliferative diabetic retinopathy without macular edema: Secondary | ICD-10-CM | POA: Diagnosis not present

## 2011-12-21 DIAGNOSIS — H353190 Nonexudative age-related macular degeneration, unspecified eye, stage unspecified: Secondary | ICD-10-CM | POA: Diagnosis not present

## 2011-12-21 DIAGNOSIS — E1139 Type 2 diabetes mellitus with other diabetic ophthalmic complication: Secondary | ICD-10-CM | POA: Diagnosis not present

## 2011-12-21 DIAGNOSIS — H4011X0 Primary open-angle glaucoma, stage unspecified: Secondary | ICD-10-CM | POA: Diagnosis not present

## 2011-12-24 DIAGNOSIS — E1129 Type 2 diabetes mellitus with other diabetic kidney complication: Secondary | ICD-10-CM | POA: Diagnosis not present

## 2011-12-24 DIAGNOSIS — N186 End stage renal disease: Secondary | ICD-10-CM | POA: Diagnosis not present

## 2011-12-28 DIAGNOSIS — I10 Essential (primary) hypertension: Secondary | ICD-10-CM | POA: Diagnosis not present

## 2011-12-28 DIAGNOSIS — E7800 Pure hypercholesterolemia, unspecified: Secondary | ICD-10-CM | POA: Diagnosis not present

## 2011-12-30 DIAGNOSIS — E11319 Type 2 diabetes mellitus with unspecified diabetic retinopathy without macular edema: Secondary | ICD-10-CM | POA: Diagnosis not present

## 2011-12-30 DIAGNOSIS — H409 Unspecified glaucoma: Secondary | ICD-10-CM | POA: Diagnosis not present

## 2011-12-30 DIAGNOSIS — N186 End stage renal disease: Secondary | ICD-10-CM | POA: Diagnosis not present

## 2011-12-30 DIAGNOSIS — H4011X0 Primary open-angle glaucoma, stage unspecified: Secondary | ICD-10-CM | POA: Diagnosis not present

## 2011-12-30 DIAGNOSIS — E1139 Type 2 diabetes mellitus with other diabetic ophthalmic complication: Secondary | ICD-10-CM | POA: Diagnosis not present

## 2011-12-31 DIAGNOSIS — N186 End stage renal disease: Secondary | ICD-10-CM | POA: Diagnosis not present

## 2011-12-31 DIAGNOSIS — N2581 Secondary hyperparathyroidism of renal origin: Secondary | ICD-10-CM | POA: Diagnosis not present

## 2011-12-31 DIAGNOSIS — D509 Iron deficiency anemia, unspecified: Secondary | ICD-10-CM | POA: Diagnosis not present

## 2011-12-31 DIAGNOSIS — D631 Anemia in chronic kidney disease: Secondary | ICD-10-CM | POA: Diagnosis not present

## 2011-12-31 DIAGNOSIS — E213 Hyperparathyroidism, unspecified: Secondary | ICD-10-CM | POA: Diagnosis not present

## 2012-01-06 ENCOUNTER — Ambulatory Visit (INDEPENDENT_AMBULATORY_CARE_PROVIDER_SITE_OTHER): Payer: Medicare Other | Admitting: Internal Medicine

## 2012-01-06 ENCOUNTER — Other Ambulatory Visit: Payer: Self-pay | Admitting: Internal Medicine

## 2012-01-06 ENCOUNTER — Encounter: Payer: Self-pay | Admitting: Internal Medicine

## 2012-01-06 VITALS — BP 140/60 | HR 72 | Temp 98.2°F | Resp 18 | Ht 60.0 in | Wt 139.0 lb

## 2012-01-06 DIAGNOSIS — E46 Unspecified protein-calorie malnutrition: Secondary | ICD-10-CM

## 2012-01-06 DIAGNOSIS — N289 Disorder of kidney and ureter, unspecified: Secondary | ICD-10-CM | POA: Diagnosis not present

## 2012-01-06 DIAGNOSIS — I10 Essential (primary) hypertension: Secondary | ICD-10-CM

## 2012-01-06 NOTE — Patient Instructions (Addendum)
The patient is instructed to continue all medications as prescribed. Schedule followup with check out clerk upon leaving the clinic  

## 2012-01-06 NOTE — Progress Notes (Signed)
Subjective:    Patient ID: [name], female    DOB: 03-31-1929, 78 y.o.   MRN: [id number]  Gastrophageal Reflux She reports no abdominal pain, no coughing or no wheezing. Associated symptoms include fatigue.  Hyperlipidemia Pertinent negatives include no myalgias or shortness of breath.   Assessment doing relatively well she is on hemodialysis and has been stable.  We discussed protein ( she has been eating vegetables) such as fish or chicken and eggs  Review of Systems  Constitutional: Positive for fatigue and unexpected weight change. Negative for activity change and appetite change.  HENT: Negative for ear pain, congestion, neck pain, postnasal drip and sinus pressure.   Eyes: Negative for redness and visual disturbance.  Respiratory: Negative for cough, shortness of breath and wheezing.   Cardiovascular: Positive for leg swelling.  Gastrointestinal: Negative for abdominal pain and abdominal distention.  Genitourinary: Negative for dysuria, frequency and menstrual problem.  Musculoskeletal: Negative for myalgias, joint swelling and arthralgias.  Skin: Negative for rash and wound.  Neurological: Positive for weakness. Negative for dizziness and headaches.  Hematological: Negative for adenopathy. Does not bruise/bleed easily.  Psychiatric/Behavioral: Negative for disturbed wake/sleep cycle and decreased concentration.   Past Medical History  Diagnosis Date  . Cough   . Diabetes mellitus     type 2  . GERD (gastroesophageal reflux disease)   . Hypertension   . Bronchitis   . Osteoarthritis   . End stage renal disease   . Hemodialysis patient     esrd    History   Social History  . Marital Status: Married    Spouse Name: N/A    Number of Children: N/A  . Years of Education: N/A   Occupational History  . Not on file.   Social History Main Topics  . Smoking status: Never Smoker   . Smokeless tobacco: Not on file  . Alcohol Use: No  . Drug Use: No  .  Sexually Active: No   Other Topics Concern  . Not on file   Social History Narrative  . No narrative on file    Past Surgical History  Procedure Date  . Eye surgery     Cataract extraction    Family History  Problem Relation Age of Onset  . Hypertension Mother     No Known Allergies  Current Outpatient Prescriptions on File Prior to Visit  Medication Sig Dispense Refill  . Acetaminophen (TYLENOL EXTRA STRENGTH) 167 MG/5ML LIQD Take by mouth as needed.        . Dorzolamide HCl-Timolol Mal (COSOPT OP) Apply to eye daily.        [name] escitalopram (LEXAPRO) 10 MG tablet TAKE ONE-HALF TABLET BY MOUTH EVERY DAY  90 tablet  3  . folic acid-vitamin b complex-vitamin c-selenium-zinc (DIALYVITE) 3 MG TABS Take 1 tablet by mouth daily.        . insulin glargine (LANTUS) 100 UNIT/ML injection Inject 5 Units into the skin at bedtime.       [name] levocetirizine (XYZAL) 5 MG tablet Take 5 mg by mouth daily. 1/2 once daily       . NEXIUM 40 MG capsule TAKE 1 CAPSULE EVERY DAY  90 capsule  2  . ondansetron (ZOFRAN) 4 MG tablet Take 4 mg by mouth every 6 (six) hours as needed. For nausea and vomitting       . QUALAQUIN 324 MG capsule TAKE TWO CAPSULES BY MOUTH AS NEEDED FOR LEG CRAMPS  30 each  6  .  sevelamer (RENAGEL) 800 MG tablet Take 800 mg by mouth daily.        [name] DISCONTD: simvastatin (ZOCOR) 80 MG tablet TAKE ONE TABLET DAILY  30 tablet  0    BP 140/60  Pulse 72  Temp 98.2 F (36.8 C)  Resp 18  Ht 5' (1.524 m)  Wt 139 lb (63.05 kg)  BMI 27.15 kg/m2       Objective:   Physical Exam  Nursing note and vitals reviewed. Constitutional: She is oriented to person, place, and time. She appears well-developed and well-nourished. No distress.  HENT:  Head: Normocephalic and atraumatic.  Right Ear: External ear normal.  Left Ear: External ear normal.  Nose: Nose normal.  Mouth/Throat: Oropharynx is clear and moist.  Eyes: Conjunctivae and EOM are normal. Pupils are equal, round, and  reactive to light.  Neck: Normal range of motion. Neck supple. No JVD present. No tracheal deviation present. No thyromegaly present.  Cardiovascular: Normal rate, regular rhythm, normal heart sounds and intact distal pulses.   No murmur heard. Pulmonary/Chest: Effort normal and breath sounds normal. She has no wheezes. She exhibits no tenderness.  Abdominal: Soft. Bowel sounds are normal.  Musculoskeletal: She exhibits edema and tenderness.  Lymphadenopathy:    She has no cervical adenopathy.  Neurological: She is alert and oriented to person, place, and time. She has normal reflexes. No cranial nerve deficit.  Skin: Skin is warm and dry. She is not diaphoretic.  Psychiatric: She has a normal mood and affect. Her behavior is normal.          Assessment & Plan:  The patient has end-stage renal disease and is on dialysis her mental status has been Her spirits have been good she has been tolerating her dialysis well.  reviewed diet and protien  Such as fish Weight stable Stable problems

## 2012-01-30 DIAGNOSIS — N186 End stage renal disease: Secondary | ICD-10-CM | POA: Diagnosis not present

## 2012-02-02 DIAGNOSIS — D631 Anemia in chronic kidney disease: Secondary | ICD-10-CM | POA: Diagnosis not present

## 2012-02-02 DIAGNOSIS — D509 Iron deficiency anemia, unspecified: Secondary | ICD-10-CM | POA: Diagnosis not present

## 2012-02-02 DIAGNOSIS — N186 End stage renal disease: Secondary | ICD-10-CM | POA: Diagnosis not present

## 2012-02-02 DIAGNOSIS — N2581 Secondary hyperparathyroidism of renal origin: Secondary | ICD-10-CM | POA: Diagnosis not present

## 2012-02-22 DIAGNOSIS — E1059 Type 1 diabetes mellitus with other circulatory complications: Secondary | ICD-10-CM | POA: Diagnosis not present

## 2012-02-22 DIAGNOSIS — L608 Other nail disorders: Secondary | ICD-10-CM | POA: Diagnosis not present

## 2012-02-22 DIAGNOSIS — L84 Corns and callosities: Secondary | ICD-10-CM | POA: Diagnosis not present

## 2012-02-22 DIAGNOSIS — I739 Peripheral vascular disease, unspecified: Secondary | ICD-10-CM | POA: Diagnosis not present

## 2012-02-29 DIAGNOSIS — N186 End stage renal disease: Secondary | ICD-10-CM | POA: Diagnosis not present

## 2012-03-01 DIAGNOSIS — D509 Iron deficiency anemia, unspecified: Secondary | ICD-10-CM | POA: Diagnosis not present

## 2012-03-01 DIAGNOSIS — N186 End stage renal disease: Secondary | ICD-10-CM | POA: Diagnosis not present

## 2012-03-01 DIAGNOSIS — N2581 Secondary hyperparathyroidism of renal origin: Secondary | ICD-10-CM | POA: Diagnosis not present

## 2012-03-17 DIAGNOSIS — E1129 Type 2 diabetes mellitus with other diabetic kidney complication: Secondary | ICD-10-CM | POA: Diagnosis not present

## 2012-03-22 ENCOUNTER — Other Ambulatory Visit: Payer: Self-pay | Admitting: *Deleted

## 2012-03-22 MED ORDER — TRAMADOL HCL 50 MG PO TABS: 50.0000 mg | ORAL_TABLET | Freq: Two times a day (BID) | ORAL | Status: DC

## 2012-03-31 DIAGNOSIS — N186 End stage renal disease: Secondary | ICD-10-CM | POA: Diagnosis not present

## 2012-04-02 DIAGNOSIS — N2581 Secondary hyperparathyroidism of renal origin: Secondary | ICD-10-CM | POA: Diagnosis not present

## 2012-04-02 DIAGNOSIS — N186 End stage renal disease: Secondary | ICD-10-CM | POA: Diagnosis not present

## 2012-04-02 DIAGNOSIS — D631 Anemia in chronic kidney disease: Secondary | ICD-10-CM | POA: Diagnosis not present

## 2012-04-04 ENCOUNTER — Telehealth: Payer: Self-pay | Admitting: Internal Medicine

## 2012-04-04 NOTE — Telephone Encounter (Signed)
Caller: [name]/Child; Patient Name: [name]; PCP: Darryll Capers (Adults only); Best Callback Phone Number: [phone] Daughter calling about dizziness and lightheadedness. Onset: 2 weeks. Afebrile. All emergent sxs ruled out per Dizziness or Vertigo protocol with exception to 'Symptoms worsen with movement of head or looking up and not previously evaluated.'  Appt. made with Adline Mango, PA on 04/05/12 at 1400 after completion of pt's dialysis.

## 2012-04-05 ENCOUNTER — Ambulatory Visit (INDEPENDENT_AMBULATORY_CARE_PROVIDER_SITE_OTHER): Payer: Medicare Other | Admitting: Family

## 2012-04-05 ENCOUNTER — Encounter: Payer: Self-pay | Admitting: Family

## 2012-04-05 VITALS — BP 100/60 | HR 76 | Temp 98.6°F

## 2012-04-05 DIAGNOSIS — I959 Hypotension, unspecified: Secondary | ICD-10-CM

## 2012-04-05 DIAGNOSIS — R42 Dizziness and giddiness: Secondary | ICD-10-CM

## 2012-04-05 MED ORDER — AMLODIPINE BESYLATE 5 MG PO TABS: 5.0000 mg | ORAL_TABLET | Freq: Every day | ORAL | Status: DC

## 2012-04-05 NOTE — Patient Instructions (Signed)
Decrease Norvasc to 5mg  on the days she has dialysis.   Dizziness Dizziness is a common problem. It is a feeling of unsteadiness or lightheadedness. You may feel like you are about to faint. Dizziness can lead to injury if you stumble or fall. A person of any age group can suffer from dizziness, but dizziness is more common in older adults. CAUSES  Dizziness can be caused by many different things, including:  Middle ear problems.  Standing for too long.  Infections.  An allergic reaction.  Aging.  An emotional response to something, such as the sight of blood.  Side effects of medicines.  Fatigue.  Problems with circulation or blood pressure.  Excess use of alcohol, medicines, or illegal drug use.  Breathing too fast (hyperventilation).  An arrhythmia or problems with your heart rhythm.  Low red blood cell count (anemia).  Pregnancy.  Vomiting, diarrhea, fever, or other illnesses that cause dehydration.  Diseases or conditions such as Parkinson's disease, high blood pressure (hypertension), diabetes, and thyroid problems.  Exposure to extreme heat. DIAGNOSIS  To find the cause of your dizziness, your caregiver may do a physical exam, lab tests, radiologic imaging scans, or an electrocardiography test (ECG).  TREATMENT  Treatment of dizziness depends on the cause of your symptoms and can vary greatly. HOME CARE INSTRUCTIONS   Drink enough fluids to keep your urine clear or pale yellow. This is especially important in very hot weather. In the elderly, it is also important in cold weather.  If your dizziness is caused by medicines, take them exactly as directed. When taking blood pressure medicines, it is especially important to get up slowly.  Rise slowly from chairs and steady yourself until you feel okay.  In the morning, first sit up on the side of the bed. When this seems okay, stand slowly while holding onto something until you know your balance is fine.  If  you need to stand in one place for a long time, be sure to move your legs often. Tighten and relax the muscles in your legs while standing.  If dizziness continues to be a problem, have someone stay with you for a day or two. Do this until you feel you are well enough to stay alone. Have the person call your caregiver if he or she notices changes in you that are concerning.  Do not drive or use heavy machinery if you feel dizzy.  Do not drink alcohol. SEEK IMMEDIATE MEDICAL CARE IF:   Your dizziness or lightheadedness gets worse.  You feel nauseous or vomit.  You develop problems with talking, walking, weakness, or using your arms, hands, or legs.  You are not thinking clearly or you have difficulty forming sentences. It may take a friend or family member to determine if your thinking is normal.  You develop chest pain, abdominal pain, shortness of breath, or sweating.  Your vision changes.  You notice any bleeding.  You have side effects from medicine that seems to be getting worse rather than better. MAKE SURE YOU:   Understand these instructions.  Will watch your condition.  Will get help right away if you are not doing well or get worse. Document Released: 11/11/2000 Document Revised: 08/10/2011 Document Reviewed: 12/05/2010 [facility] Patient Information 2013 [place], [place].

## 2012-04-05 NOTE — Progress Notes (Signed)
Subjective:    Patient ID: [name], female    DOB: 1929/03/12, 78 y.o.   MRN: [id number]  HPI 78 year old African American female, chronic renal failure, patient of Dr. Lovell Sheehan is in today with complaints of dizziness. Her daughter is present and reports her dizziness began when she started Norvasc 10 mg on Tuesday, Thursday, and Saturday. Patient does not currently have any dizziness today. She denies any chest pain, palpitations, shortness of breath, nausea or vomiting. No edema.   Review of Systems  Constitutional: Negative.   Respiratory: Negative.   Cardiovascular: Negative.        Blood pressure 102/58  Gastrointestinal: Negative.   Musculoskeletal: Negative.   Neurological: Negative.   Hematological: Negative.   Psychiatric/Behavioral: Negative.    Past Medical History  Diagnosis Date  . Cough   . Diabetes mellitus     type 2  . GERD (gastroesophageal reflux disease)   . Hypertension   . Bronchitis   . Osteoarthritis   . End stage renal disease   . Hemodialysis patient     esrd    History   Social History  . Marital Status: Married    Spouse Name: N/A    Number of Children: N/A  . Years of Education: N/A   Occupational History  . Not on file.   Social History Main Topics  . Smoking status: Never Smoker   . Smokeless tobacco: Not on file  . Alcohol Use: No  . Drug Use: No  . Sexually Active: No   Other Topics Concern  . Not on file   Social History Narrative  . No narrative on file    Past Surgical History  Procedure Date  . Eye surgery     Cataract extraction    Family History  Problem Relation Age of Onset  . Hypertension Mother     No Known Allergies  Current Outpatient Prescriptions on File Prior to Visit  Medication Sig Dispense Refill  . Acetaminophen (TYLENOL EXTRA STRENGTH) 167 MG/5ML LIQD Take by mouth as needed.        [name] aspirin 81 MG tablet Take 81 mg by mouth daily.      . cinacalcet (SENSIPAR) 30 MG tablet Take 30 mg  by mouth daily.      . Dorzolamide HCl-Timolol Mal (COSOPT OP) Apply to eye daily.        [name] escitalopram (LEXAPRO) 10 MG tablet TAKE ONE-HALF TABLET BY MOUTH EVERY DAY  90 tablet  3  . folic acid-vitamin b complex-vitamin c-selenium-zinc (DIALYVITE) 3 MG TABS Take 1 tablet by mouth daily.        . insulin glargine (LANTUS) 100 UNIT/ML injection Inject 5 Units into the skin at bedtime.       [name] NEXIUM 40 MG capsule TAKE 1 CAPSULE EVERY DAY  90 capsule  2  . ondansetron (ZOFRAN) 4 MG tablet Take 4 mg by mouth every 6 (six) hours as needed. For nausea and vomitting       . QUALAQUIN 324 MG capsule TAKE TWO CAPSULES BY MOUTH AS NEEDED FOR LEG CRAMPS  30 each  6  . sevelamer (RENAGEL) 800 MG tablet Take 800 mg by mouth daily.        . simvastatin (ZOCOR) 80 MG tablet TAKE ONE TABLET BY MOUTH EVERY DAY  30 tablet  11  . traMADol (ULTRAM) 50 MG tablet Take 1 tablet (50 mg total) by mouth 2 (two) times daily.  30 tablet  3  .  amLODipine (NORVASC) 5 MG tablet Take 1 tablet (5 mg total) by mouth daily.  30 tablet  3  . levocetirizine (XYZAL) 5 MG tablet Take 5 mg by mouth daily. 1/2 once daily         BP 100/60  Pulse 76  Temp 98.6 F (37 C) (Oral)  SpO2 97%chart    Objective:   Physical Exam  Constitutional: She is oriented to person, place, and time. She appears well-developed and well-nourished.  Neck: Normal range of motion. Neck supple.  Cardiovascular: Normal rate, regular rhythm and normal heart sounds.   Pulmonary/Chest: Effort normal and breath sounds normal.  Abdominal: Soft. Bowel sounds are normal.  Neurological: She is alert and oriented to person, place, and time. She has normal reflexes.  Skin: Skin is warm and dry.          Assessment & Plan:  Assessment: Dizziness, hypotension  Plan: Decrease Norvasc to 5 mg once a day on Tuesday, Thursday, and Saturday. Daughter to call the office if symptoms worsen or persist. Recheck with Dr. Lovell Sheehan a schedule, and sooner when  necessary.

## 2012-04-30 DIAGNOSIS — N186 End stage renal disease: Secondary | ICD-10-CM | POA: Diagnosis not present

## 2012-05-03 DIAGNOSIS — N186 End stage renal disease: Secondary | ICD-10-CM | POA: Diagnosis not present

## 2012-05-03 DIAGNOSIS — N2581 Secondary hyperparathyroidism of renal origin: Secondary | ICD-10-CM | POA: Diagnosis not present

## 2012-05-03 DIAGNOSIS — D631 Anemia in chronic kidney disease: Secondary | ICD-10-CM | POA: Diagnosis not present

## 2012-05-09 ENCOUNTER — Ambulatory Visit: Payer: Medicare Other | Admitting: Internal Medicine

## 2012-05-31 DIAGNOSIS — N186 End stage renal disease: Secondary | ICD-10-CM | POA: Diagnosis not present

## 2012-06-02 DIAGNOSIS — D631 Anemia in chronic kidney disease: Secondary | ICD-10-CM | POA: Diagnosis not present

## 2012-06-02 DIAGNOSIS — N186 End stage renal disease: Secondary | ICD-10-CM | POA: Diagnosis not present

## 2012-06-02 DIAGNOSIS — N2581 Secondary hyperparathyroidism of renal origin: Secondary | ICD-10-CM | POA: Diagnosis not present

## 2012-06-23 DIAGNOSIS — T7589XA Other specified effects of external causes, initial encounter: Secondary | ICD-10-CM | POA: Diagnosis not present

## 2012-06-23 DIAGNOSIS — E1129 Type 2 diabetes mellitus with other diabetic kidney complication: Secondary | ICD-10-CM | POA: Diagnosis not present

## 2012-06-27 DIAGNOSIS — E11359 Type 2 diabetes mellitus with proliferative diabetic retinopathy without macular edema: Secondary | ICD-10-CM | POA: Diagnosis not present

## 2012-06-27 DIAGNOSIS — E1139 Type 2 diabetes mellitus with other diabetic ophthalmic complication: Secondary | ICD-10-CM | POA: Diagnosis not present

## 2012-06-27 DIAGNOSIS — H353190 Nonexudative age-related macular degeneration, unspecified eye, stage unspecified: Secondary | ICD-10-CM | POA: Diagnosis not present

## 2012-07-01 DIAGNOSIS — N186 End stage renal disease: Secondary | ICD-10-CM | POA: Diagnosis not present

## 2012-07-02 DIAGNOSIS — N2581 Secondary hyperparathyroidism of renal origin: Secondary | ICD-10-CM | POA: Diagnosis not present

## 2012-07-02 DIAGNOSIS — N186 End stage renal disease: Secondary | ICD-10-CM | POA: Diagnosis not present

## 2012-07-02 DIAGNOSIS — D631 Anemia in chronic kidney disease: Secondary | ICD-10-CM | POA: Diagnosis not present

## 2012-07-04 ENCOUNTER — Telehealth: Payer: Self-pay | Admitting: Internal Medicine

## 2012-07-04 NOTE — Telephone Encounter (Signed)
Called pt to notify her appt for 07/06/12 will need to be r/s per Dr.Jenkins.  Our office will call her back with new appt date and time.  Pt voiced her understanding.

## 2012-07-06 ENCOUNTER — Ambulatory Visit: Payer: Medicare Other | Admitting: Internal Medicine

## 2012-07-06 NOTE — Telephone Encounter (Signed)
Appointment gioven for 2-17 at 9:15

## 2012-07-18 ENCOUNTER — Encounter: Payer: Self-pay | Admitting: Internal Medicine

## 2012-07-18 ENCOUNTER — Ambulatory Visit (INDEPENDENT_AMBULATORY_CARE_PROVIDER_SITE_OTHER): Payer: Medicare Other | Admitting: Internal Medicine

## 2012-07-18 VITALS — BP 120/60 | HR 72 | Temp 98.1°F | Resp 18 | Ht 60.0 in | Wt 147.0 lb

## 2012-07-18 DIAGNOSIS — N186 End stage renal disease: Secondary | ICD-10-CM

## 2012-07-18 DIAGNOSIS — E215 Disorder of parathyroid gland, unspecified: Secondary | ICD-10-CM

## 2012-07-18 DIAGNOSIS — E559 Vitamin D deficiency, unspecified: Secondary | ICD-10-CM

## 2012-07-18 DIAGNOSIS — Z992 Dependence on renal dialysis: Secondary | ICD-10-CM

## 2012-07-18 NOTE — Patient Instructions (Signed)
I will cann you with instruction on your vit d

## 2012-07-18 NOTE — Progress Notes (Signed)
Subjective:    Patient ID: [name], female    DOB: Mar 21, 1929, 79 y.o.   MRN: [id number]  HPI  patient has ESRD on HD. Increased conjestion and posyt nasal drip Increased Arthritic pain and bone pains Secondary parathyroid dz is possible Stable  cbg'S  Review of Systems  Constitutional: Negative for activity change, appetite change and fatigue.  HENT: Positive for hearing loss, nosebleeds, congestion and rhinorrhea. Negative for ear pain, neck pain, postnasal drip and sinus pressure.   Eyes: Negative for redness and visual disturbance.  Respiratory: Negative for cough, shortness of breath and wheezing.   Gastrointestinal: Negative for abdominal pain and abdominal distention.  Genitourinary: Negative for dysuria, frequency and menstrual problem.  Musculoskeletal: Positive for myalgias and joint swelling. Negative for arthralgias.  Skin: Negative for rash and wound.  Neurological: Positive for headaches. Negative for dizziness and weakness.  Hematological: Negative for adenopathy. Does not bruise/bleed easily.  Psychiatric/Behavioral: Negative for sleep disturbance and decreased concentration.   Past Medical History  Diagnosis Date  . Cough   . Diabetes mellitus     type 2  . GERD (gastroesophageal reflux disease)   . Hypertension   . Bronchitis   . Osteoarthritis   . End stage renal disease   . Hemodialysis patient     esrd    History   Social History  . Marital Status: Married    Spouse Name: N/A    Number of Children: N/A  . Years of Education: N/A   Occupational History  . Not on file.   Social History Main Topics  . Smoking status: Never Smoker   . Smokeless tobacco: Not on file  . Alcohol Use: No  . Drug Use: No  . Sexually Active: No   Other Topics Concern  . Not on file   Social History Narrative  . No narrative on file    Past Surgical History  Procedure Laterality Date  . Eye surgery      Cataract extraction    Family History   Problem Relation Age of Onset  . Hypertension Mother     No Known Allergies  Current Outpatient Prescriptions on File Prior to Visit  Medication Sig Dispense Refill  . Acetaminophen (TYLENOL EXTRA STRENGTH) 167 MG/5ML LIQD Take by mouth as needed.        [name] amLODipine (NORVASC) 5 MG tablet Take 1 tablet (5 mg total) by mouth daily.  30 tablet  3  . aspirin 81 MG tablet Take 81 mg by mouth daily.      . cinacalcet (SENSIPAR) 30 MG tablet Take 30 mg by mouth daily.      . Dorzolamide HCl-Timolol Mal (COSOPT OP) Apply to eye daily.        [name] escitalopram (LEXAPRO) 10 MG tablet TAKE ONE-HALF TABLET BY MOUTH EVERY DAY  90 tablet  3  . folic acid-vitamin b complex-vitamin c-selenium-zinc (DIALYVITE) 3 MG TABS Take 1 tablet by mouth daily.        . insulin glargine (LANTUS) 100 UNIT/ML injection Inject 5 Units into the skin at bedtime.       [name] levocetirizine (XYZAL) 5 MG tablet Take 5 mg by mouth daily. 1/2 once daily       . NEXIUM 40 MG capsule TAKE 1 CAPSULE EVERY DAY  90 capsule  2  . ondansetron (ZOFRAN) 4 MG tablet Take 4 mg by mouth every 6 (six) hours as needed. For nausea and vomitting       .  QUALAQUIN 324 MG capsule TAKE TWO CAPSULES BY MOUTH AS NEEDED FOR LEG CRAMPS  30 each  6  . sevelamer (RENAGEL) 800 MG tablet Take 800 mg by mouth daily.        . simvastatin (ZOCOR) 80 MG tablet TAKE ONE TABLET BY MOUTH EVERY DAY  30 tablet  11  . traMADol (ULTRAM) 50 MG tablet Take 1 tablet (50 mg total) by mouth 2 (two) times daily.  30 tablet  3   No current facility-administered medications on file prior to visit.    BP 120/60  Pulse 72  Temp(Src) 98.1 F (36.7 C)  Resp 18  Ht 5' (1.524 m)  Wt 147 lb (66.679 kg)  BMI 28.71 kg/m2       Objective:   Physical Exam  Nursing note and vitals reviewed. Constitutional: She appears well-nourished.  HENT:  Head: Normocephalic and atraumatic.  Eyes:  nasal congestion  Cardiovascular: Regular rhythm.   Murmur heard. Abdominal: Soft.  Bowel sounds are normal.  Musculoskeletal: She exhibits edema and tenderness.  Skin: Skin is warm and dry.          Assessment & Plan:  Deep bone pain  Screen for worsening secondary parathyroid dz May need to increase sinsepar moniter ca, PTH and vit d Call patietn with instructions Stable AODM

## 2012-07-19 LAB — PTH, INTACT AND CALCIUM: PTH: 105.1 pg/mL — ABNORMAL HIGH (ref 14.0–72.0)

## 2012-07-29 DIAGNOSIS — N186 End stage renal disease: Secondary | ICD-10-CM | POA: Diagnosis not present

## 2012-07-30 DIAGNOSIS — N2581 Secondary hyperparathyroidism of renal origin: Secondary | ICD-10-CM | POA: Diagnosis not present

## 2012-07-30 DIAGNOSIS — D631 Anemia in chronic kidney disease: Secondary | ICD-10-CM | POA: Diagnosis not present

## 2012-07-30 DIAGNOSIS — N186 End stage renal disease: Secondary | ICD-10-CM | POA: Diagnosis not present

## 2012-08-10 DIAGNOSIS — E7800 Pure hypercholesterolemia, unspecified: Secondary | ICD-10-CM | POA: Diagnosis not present

## 2012-08-10 DIAGNOSIS — I10 Essential (primary) hypertension: Secondary | ICD-10-CM | POA: Diagnosis not present

## 2012-08-29 DIAGNOSIS — N186 End stage renal disease: Secondary | ICD-10-CM | POA: Diagnosis not present

## 2012-08-30 DIAGNOSIS — N186 End stage renal disease: Secondary | ICD-10-CM | POA: Diagnosis not present

## 2012-08-30 DIAGNOSIS — D631 Anemia in chronic kidney disease: Secondary | ICD-10-CM | POA: Diagnosis not present

## 2012-08-30 DIAGNOSIS — N2581 Secondary hyperparathyroidism of renal origin: Secondary | ICD-10-CM | POA: Diagnosis not present

## 2012-09-28 DIAGNOSIS — N186 End stage renal disease: Secondary | ICD-10-CM | POA: Diagnosis not present

## 2012-09-29 DIAGNOSIS — D631 Anemia in chronic kidney disease: Secondary | ICD-10-CM | POA: Diagnosis not present

## 2012-09-29 DIAGNOSIS — E209 Hypoparathyroidism, unspecified: Secondary | ICD-10-CM | POA: Diagnosis not present

## 2012-09-29 DIAGNOSIS — N2581 Secondary hyperparathyroidism of renal origin: Secondary | ICD-10-CM | POA: Diagnosis not present

## 2012-09-29 DIAGNOSIS — N186 End stage renal disease: Secondary | ICD-10-CM | POA: Diagnosis not present

## 2012-10-03 DIAGNOSIS — L608 Other nail disorders: Secondary | ICD-10-CM | POA: Diagnosis not present

## 2012-10-03 DIAGNOSIS — I739 Peripheral vascular disease, unspecified: Secondary | ICD-10-CM | POA: Diagnosis not present

## 2012-10-03 DIAGNOSIS — E1059 Type 1 diabetes mellitus with other circulatory complications: Secondary | ICD-10-CM | POA: Diagnosis not present

## 2012-10-03 DIAGNOSIS — L84 Corns and callosities: Secondary | ICD-10-CM | POA: Diagnosis not present

## 2012-10-10 ENCOUNTER — Ambulatory Visit: Payer: Self-pay | Admitting: Vascular Surgery

## 2012-10-10 DIAGNOSIS — T82898A Other specified complication of vascular prosthetic devices, implants and grafts, initial encounter: Secondary | ICD-10-CM | POA: Diagnosis not present

## 2012-10-10 DIAGNOSIS — E1129 Type 2 diabetes mellitus with other diabetic kidney complication: Secondary | ICD-10-CM | POA: Diagnosis not present

## 2012-10-10 DIAGNOSIS — N186 End stage renal disease: Secondary | ICD-10-CM | POA: Diagnosis not present

## 2012-10-10 DIAGNOSIS — E119 Type 2 diabetes mellitus without complications: Secondary | ICD-10-CM | POA: Diagnosis not present

## 2012-10-17 ENCOUNTER — Other Ambulatory Visit: Payer: Self-pay | Admitting: Internal Medicine

## 2012-10-29 DIAGNOSIS — N186 End stage renal disease: Secondary | ICD-10-CM | POA: Diagnosis not present

## 2012-11-01 DIAGNOSIS — D631 Anemia in chronic kidney disease: Secondary | ICD-10-CM | POA: Diagnosis not present

## 2012-11-01 DIAGNOSIS — N186 End stage renal disease: Secondary | ICD-10-CM | POA: Diagnosis not present

## 2012-11-16 ENCOUNTER — Ambulatory Visit: Payer: Medicare Other | Admitting: Internal Medicine

## 2012-11-23 DIAGNOSIS — N186 End stage renal disease: Secondary | ICD-10-CM | POA: Diagnosis not present

## 2012-11-23 DIAGNOSIS — T82898A Other specified complication of vascular prosthetic devices, implants and grafts, initial encounter: Secondary | ICD-10-CM | POA: Diagnosis not present

## 2012-11-23 DIAGNOSIS — E119 Type 2 diabetes mellitus without complications: Secondary | ICD-10-CM | POA: Diagnosis not present

## 2012-11-28 DIAGNOSIS — N186 End stage renal disease: Secondary | ICD-10-CM | POA: Diagnosis not present

## 2012-11-29 DIAGNOSIS — N186 End stage renal disease: Secondary | ICD-10-CM | POA: Diagnosis not present

## 2012-11-29 DIAGNOSIS — D631 Anemia in chronic kidney disease: Secondary | ICD-10-CM | POA: Diagnosis not present

## 2012-12-26 DIAGNOSIS — E11359 Type 2 diabetes mellitus with proliferative diabetic retinopathy without macular edema: Secondary | ICD-10-CM | POA: Diagnosis not present

## 2012-12-26 DIAGNOSIS — H353190 Nonexudative age-related macular degeneration, unspecified eye, stage unspecified: Secondary | ICD-10-CM | POA: Diagnosis not present

## 2012-12-26 DIAGNOSIS — E1139 Type 2 diabetes mellitus with other diabetic ophthalmic complication: Secondary | ICD-10-CM | POA: Diagnosis not present

## 2012-12-26 DIAGNOSIS — H4720 Unspecified optic atrophy: Secondary | ICD-10-CM | POA: Diagnosis not present

## 2012-12-26 DIAGNOSIS — H35369 Drusen (degenerative) of macula, unspecified eye: Secondary | ICD-10-CM | POA: Diagnosis not present

## 2012-12-28 ENCOUNTER — Other Ambulatory Visit: Payer: Self-pay | Admitting: Internal Medicine

## 2012-12-29 DIAGNOSIS — N186 End stage renal disease: Secondary | ICD-10-CM | POA: Diagnosis not present

## 2012-12-31 DIAGNOSIS — E209 Hypoparathyroidism, unspecified: Secondary | ICD-10-CM | POA: Diagnosis not present

## 2012-12-31 DIAGNOSIS — D631 Anemia in chronic kidney disease: Secondary | ICD-10-CM | POA: Diagnosis not present

## 2012-12-31 DIAGNOSIS — N186 End stage renal disease: Secondary | ICD-10-CM | POA: Diagnosis not present

## 2013-01-06 DIAGNOSIS — L84 Corns and callosities: Secondary | ICD-10-CM | POA: Diagnosis not present

## 2013-01-06 DIAGNOSIS — L608 Other nail disorders: Secondary | ICD-10-CM | POA: Diagnosis not present

## 2013-01-06 DIAGNOSIS — I739 Peripheral vascular disease, unspecified: Secondary | ICD-10-CM | POA: Diagnosis not present

## 2013-01-06 DIAGNOSIS — E1059 Type 1 diabetes mellitus with other circulatory complications: Secondary | ICD-10-CM | POA: Diagnosis not present

## 2013-01-07 ENCOUNTER — Other Ambulatory Visit: Payer: Self-pay | Admitting: Internal Medicine

## 2013-01-09 ENCOUNTER — Encounter: Payer: Self-pay | Admitting: Internal Medicine

## 2013-01-09 ENCOUNTER — Ambulatory Visit (INDEPENDENT_AMBULATORY_CARE_PROVIDER_SITE_OTHER): Payer: Medicare Other | Admitting: Internal Medicine

## 2013-01-09 VITALS — BP 120/60 | HR 72 | Temp 98.6°F | Resp 18 | Ht 60.0 in | Wt 147.0 lb

## 2013-01-09 DIAGNOSIS — I10 Essential (primary) hypertension: Secondary | ICD-10-CM | POA: Diagnosis not present

## 2013-01-09 DIAGNOSIS — N186 End stage renal disease: Secondary | ICD-10-CM | POA: Diagnosis not present

## 2013-01-09 DIAGNOSIS — IMO0001 Reserved for inherently not codable concepts without codable children: Secondary | ICD-10-CM

## 2013-01-09 MED ORDER — SIMVASTATIN 80 MG PO TABS: ORAL_TABLET | ORAL | Status: DC

## 2013-01-09 NOTE — Patient Instructions (Signed)
The patient is instructed to continue all medications as prescribed. Schedule followup with check out clerk upon leaving the clinic  

## 2013-01-09 NOTE — Progress Notes (Signed)
Subjective:    Patient ID: [name], female    DOB: 10/14/1928, 79 y.o.   MRN: [id number]  HPIthe patient is an 79 year old African American female with end-stage renal disease adult-onset diabetes was followed by renal she presents today for longitudinal visit for primary care.  She has diabetic neuropathy and is followed by a podiatrist to keeps her nails trams and repeat exam to a regular basis she has moderately severe neuropathy.  She dialyzes on Tuesday Thursday and Saturday Her blood work is done to the dialysis center    Review of Systems    generally she feels well she sometimes becomes moderately short of breath in between dialysis especially over the weekend if she has some Valium overload.  She sometimes has hypotension after dialysis.  She complains of the rash she has moderate to severe peripheral neuropathy She has no reported indigestion shortness of breath PND orthopnea normally she has no reported rash her mental status is stable she is alert and oriented x3  Objective:   Physical Exam Past Medical History  Diagnosis Date  . Cough   . Diabetes mellitus     type 2  . GERD (gastroesophageal reflux disease)   . Hypertension   . Bronchitis   . Osteoarthritis   . End stage renal disease   . Hemodialysis patient     esrd    History   Social History  . Marital Status: Married    Spouse Name: N/A    Number of Children: N/A  . Years of Education: N/A   Occupational History  . Not on file.   Social History Main Topics  . Smoking status: Never Smoker   . Smokeless tobacco: Not on file  . Alcohol Use: No  . Drug Use: No  . Sexually Active: No   Other Topics Concern  . Not on file   Social History Narrative  . No narrative on file    Past Surgical History  Procedure Laterality Date  . Eye surgery      Cataract extraction    Family History  Problem Relation Age of Onset  . Hypertension Mother     No Known Allergies  Current  Outpatient Prescriptions on File Prior to Visit  Medication Sig Dispense Refill  . Acetaminophen (TYLENOL EXTRA STRENGTH) 167 MG/5ML LIQD Take by mouth as needed.        [name] amLODipine (NORVASC) 5 MG tablet Take 1 tablet (5 mg total) by mouth daily.  30 tablet  3  . aspirin 81 MG tablet Take 81 mg by mouth daily.      . cinacalcet (SENSIPAR) 30 MG tablet Take 30 mg by mouth daily.      . Dorzolamide HCl-Timolol Mal (COSOPT OP) Apply to eye daily.        [name] escitalopram (LEXAPRO) 10 MG tablet TAKE ONE-HALF TABLET BY MOUTH EVERY DAY  90 tablet  0  . folic acid-vitamin b complex-vitamin c-selenium-zinc (DIALYVITE) 3 MG TABS Take 1 tablet by mouth daily.        . insulin glargine (LANTUS) 100 UNIT/ML injection Inject 5 Units into the skin at bedtime.       [name] levocetirizine (XYZAL) 5 MG tablet Take 5 mg by mouth daily. 1/2 once daily       . NEXIUM 40 MG capsule TAKE 1 CAPSULE EVERY DAY  90 capsule  1  . ondansetron (ZOFRAN) 4 MG tablet Take 4 mg by mouth every 6 (six) hours as needed. For  nausea and vomitting       . QUALAQUIN 324 MG capsule TAKE TWO CAPSULES BY MOUTH AS NEEDED FOR LEG CRAMPS  30 each  6  . sevelamer (RENAGEL) 800 MG tablet Take 800 mg by mouth daily.        . traMADol (ULTRAM) 50 MG tablet Take 1 tablet (50 mg total) by mouth 2 (two) times daily.  30 tablet  3   No current facility-administered medications on file prior to visit.    BP 120/60  Pulse 72  Temp(Src) 98.6 F (37 C)  Resp 18  Ht 5' (1.524 m)  Wt 147 lb (66.679 kg)  BMI 28.71 kg/m2  On physical examination she is a pleasant African American female in no apparent distress HEENT reveals pupils are equal round reactive to light and accommodation. Neck was supple lung fields showed slight crackles at the bases Examination revealed a regular rate and rhythm with a 2/6 systolic murmur abdomen was soft bowel sounds are present extremity examination revealed fungal nails trace edema and moderately severe  neuropathy.        Assessment & Plan:  Monitoring for diabetes today we'll draw hemoglobin A1c reviewed the notes from the dialysis center she is up-to-date with all immunizations per the dialysis center they also do all appropriate blood work her weight is stable He is doing reasonably well on her current medications no change on her Lantus regimen at this time. She reports no hypoglycemia

## 2013-01-18 ENCOUNTER — Other Ambulatory Visit: Payer: Self-pay | Admitting: Internal Medicine

## 2013-01-29 DIAGNOSIS — N186 End stage renal disease: Secondary | ICD-10-CM | POA: Diagnosis not present

## 2013-01-31 DIAGNOSIS — D631 Anemia in chronic kidney disease: Secondary | ICD-10-CM | POA: Diagnosis not present

## 2013-01-31 DIAGNOSIS — N186 End stage renal disease: Secondary | ICD-10-CM | POA: Diagnosis not present

## 2013-02-06 ENCOUNTER — Ambulatory Visit: Payer: Medicare Other | Admitting: Internal Medicine

## 2013-02-24 DIAGNOSIS — T82898A Other specified complication of vascular prosthetic devices, implants and grafts, initial encounter: Secondary | ICD-10-CM | POA: Diagnosis not present

## 2013-02-24 DIAGNOSIS — I10 Essential (primary) hypertension: Secondary | ICD-10-CM | POA: Diagnosis not present

## 2013-02-24 DIAGNOSIS — N186 End stage renal disease: Secondary | ICD-10-CM | POA: Diagnosis not present

## 2013-02-28 DIAGNOSIS — N186 End stage renal disease: Secondary | ICD-10-CM | POA: Diagnosis not present

## 2013-03-02 DIAGNOSIS — N186 End stage renal disease: Secondary | ICD-10-CM | POA: Diagnosis not present

## 2013-03-02 DIAGNOSIS — N2581 Secondary hyperparathyroidism of renal origin: Secondary | ICD-10-CM | POA: Diagnosis not present

## 2013-03-02 DIAGNOSIS — D509 Iron deficiency anemia, unspecified: Secondary | ICD-10-CM | POA: Diagnosis not present

## 2013-03-02 DIAGNOSIS — D631 Anemia in chronic kidney disease: Secondary | ICD-10-CM | POA: Diagnosis not present

## 2013-03-03 DIAGNOSIS — Z23 Encounter for immunization: Secondary | ICD-10-CM | POA: Diagnosis not present

## 2013-03-14 ENCOUNTER — Ambulatory Visit: Payer: Self-pay | Admitting: Vascular Surgery

## 2013-03-14 DIAGNOSIS — E119 Type 2 diabetes mellitus without complications: Secondary | ICD-10-CM | POA: Diagnosis not present

## 2013-03-14 DIAGNOSIS — I120 Hypertensive chronic kidney disease with stage 5 chronic kidney disease or end stage renal disease: Secondary | ICD-10-CM | POA: Diagnosis not present

## 2013-03-14 DIAGNOSIS — I10 Essential (primary) hypertension: Secondary | ICD-10-CM | POA: Diagnosis not present

## 2013-03-14 DIAGNOSIS — Z992 Dependence on renal dialysis: Secondary | ICD-10-CM | POA: Diagnosis not present

## 2013-03-14 DIAGNOSIS — T82898A Other specified complication of vascular prosthetic devices, implants and grafts, initial encounter: Secondary | ICD-10-CM | POA: Diagnosis not present

## 2013-03-14 DIAGNOSIS — N186 End stage renal disease: Secondary | ICD-10-CM | POA: Diagnosis not present

## 2013-03-23 DIAGNOSIS — E1129 Type 2 diabetes mellitus with other diabetic kidney complication: Secondary | ICD-10-CM | POA: Diagnosis not present

## 2013-03-31 DIAGNOSIS — N186 End stage renal disease: Secondary | ICD-10-CM | POA: Diagnosis not present

## 2013-04-01 DIAGNOSIS — D631 Anemia in chronic kidney disease: Secondary | ICD-10-CM | POA: Diagnosis not present

## 2013-04-01 DIAGNOSIS — N2581 Secondary hyperparathyroidism of renal origin: Secondary | ICD-10-CM | POA: Diagnosis not present

## 2013-04-01 DIAGNOSIS — N186 End stage renal disease: Secondary | ICD-10-CM | POA: Diagnosis not present

## 2013-04-10 DIAGNOSIS — L608 Other nail disorders: Secondary | ICD-10-CM | POA: Diagnosis not present

## 2013-04-10 DIAGNOSIS — I739 Peripheral vascular disease, unspecified: Secondary | ICD-10-CM | POA: Diagnosis not present

## 2013-04-10 DIAGNOSIS — E1059 Type 1 diabetes mellitus with other circulatory complications: Secondary | ICD-10-CM | POA: Diagnosis not present

## 2013-04-10 DIAGNOSIS — L84 Corns and callosities: Secondary | ICD-10-CM | POA: Diagnosis not present

## 2013-04-30 DIAGNOSIS — N186 End stage renal disease: Secondary | ICD-10-CM | POA: Diagnosis not present

## 2013-05-01 DIAGNOSIS — I10 Essential (primary) hypertension: Secondary | ICD-10-CM | POA: Diagnosis not present

## 2013-05-01 DIAGNOSIS — E119 Type 2 diabetes mellitus without complications: Secondary | ICD-10-CM | POA: Diagnosis not present

## 2013-05-01 DIAGNOSIS — N186 End stage renal disease: Secondary | ICD-10-CM | POA: Diagnosis not present

## 2013-05-02 DIAGNOSIS — D631 Anemia in chronic kidney disease: Secondary | ICD-10-CM | POA: Diagnosis not present

## 2013-05-02 DIAGNOSIS — N186 End stage renal disease: Secondary | ICD-10-CM | POA: Diagnosis not present

## 2013-05-02 DIAGNOSIS — N2581 Secondary hyperparathyroidism of renal origin: Secondary | ICD-10-CM | POA: Diagnosis not present

## 2013-05-12 ENCOUNTER — Other Ambulatory Visit: Payer: Self-pay | Admitting: *Deleted

## 2013-05-12 ENCOUNTER — Encounter: Payer: Self-pay | Admitting: Internal Medicine

## 2013-05-12 ENCOUNTER — Ambulatory Visit (INDEPENDENT_AMBULATORY_CARE_PROVIDER_SITE_OTHER): Payer: Medicare Other | Admitting: Internal Medicine

## 2013-05-12 VITALS — BP 136/60 | HR 72 | Temp 98.0°F | Resp 18 | Ht 60.0 in | Wt 144.0 lb

## 2013-05-12 DIAGNOSIS — E785 Hyperlipidemia, unspecified: Secondary | ICD-10-CM

## 2013-05-12 DIAGNOSIS — Z23 Encounter for immunization: Secondary | ICD-10-CM

## 2013-05-12 DIAGNOSIS — N186 End stage renal disease: Secondary | ICD-10-CM

## 2013-05-12 DIAGNOSIS — I10 Essential (primary) hypertension: Secondary | ICD-10-CM | POA: Diagnosis not present

## 2013-05-12 MED ORDER — TETANUS-DIPHTHERIA TOXOIDS TD 2-2 LF/0.5ML IM SUSP: 0.5000 mL | Freq: Once | INTRAMUSCULAR | Status: DC

## 2013-05-12 NOTE — Addendum Note (Signed)
Addended by: Willy Eddy on: 05/12/2013 12:50 PM   Modules accepted: Orders

## 2013-05-12 NOTE — Patient Instructions (Signed)
You received at tetnus and a prevnar today

## 2013-05-12 NOTE — Addendum Note (Signed)
Addended by: Willy Eddy on: 05/12/2013 01:04 PM   Modules accepted: Orders

## 2013-05-12 NOTE — Progress Notes (Signed)
Subjective:    Patient ID: [name], female    DOB: December 22, 1928, 79 y.o.   MRN: [id number]  HPI The patient sees Dr Lurene Shadow for DM and has insulin has been held for now blood pressure  Is stable Weight stable ESRD on dialysis Lab work per dialysis and endocrine     Review of Systems  Constitutional: Positive for activity change and fatigue.  HENT: Positive for hearing loss. Negative for mouth sores and postnasal drip.   Eyes: Positive for photophobia and redness. Negative for itching.  Respiratory: Negative for apnea, chest tightness, shortness of breath and stridor.   Cardiovascular: Negative for chest pain and leg swelling.  Neurological: Positive for weakness. Negative for facial asymmetry, numbness and headaches.   Past Medical History  Diagnosis Date  . Cough   . Diabetes mellitus     type 2  . GERD (gastroesophageal reflux disease)   . Hypertension   . Bronchitis   . Osteoarthritis   . End stage renal disease   . Hemodialysis patient     esrd    History   Social History  . Marital Status: Married    Spouse Name: N/A    Number of Children: N/A  . Years of Education: N/A   Occupational History  . Not on file.   Social History Main Topics  . Smoking status: Never Smoker   . Smokeless tobacco: Not on file  . Alcohol Use: No  . Drug Use: No  . Sexual Activity: No   Other Topics Concern  . Not on file   Social History Narrative  . No narrative on file    Past Surgical History  Procedure Laterality Date  . Eye surgery      Cataract extraction    Family History  Problem Relation Age of Onset  . Hypertension Mother     No Known Allergies  Current Outpatient Prescriptions on File Prior to Visit  Medication Sig Dispense Refill  . Acetaminophen (TYLENOL EXTRA STRENGTH) 167 MG/5ML LIQD Take by mouth as needed.        [name] amLODipine (NORVASC) 5 MG tablet Take 1 tablet (5 mg total) by mouth daily.  30 tablet  3  . aspirin 81 MG tablet Take 81  mg by mouth daily.      . cinacalcet (SENSIPAR) 30 MG tablet Take 30 mg by mouth daily.      . Dorzolamide HCl-Timolol Mal (COSOPT OP) Apply to eye daily.        [name] escitalopram (LEXAPRO) 10 MG tablet TAKE ONE-HALF TABLET BY MOUTH EVERY DAY  90 tablet  0  . folic acid-vitamin b complex-vitamin c-selenium-zinc (DIALYVITE) 3 MG TABS Take 1 tablet by mouth daily.        [name] levocetirizine (XYZAL) 5 MG tablet Take 5 mg by mouth daily. 1/2 once daily       . NEXIUM 40 MG capsule TAKE 1 CAPSULE EVERY DAY  90 capsule  1  . ondansetron (ZOFRAN) 4 MG tablet Take 4 mg by mouth every 6 (six) hours as needed. For nausea and vomitting       . quiNINE (QUALAQUIN) 324 MG capsule TAKE TWO CAPSULES BY MOUTH AS NEEDED FOR LEG CRAMPS  60 capsule  3  . sevelamer (RENAGEL) 800 MG tablet Take 800 mg by mouth daily.        . simvastatin (ZOCOR) 80 MG tablet TAKE ONE TABLET BY MOUTH EVERY DAY  30 tablet  11  . traMADol (  ULTRAM) 50 MG tablet Take 1 tablet (50 mg total) by mouth 2 (two) times daily.  30 tablet  3   No current facility-administered medications on file prior to visit.    BP 136/60  Pulse 72  Temp(Src) 98 F (36.7 C)  Resp 18  Ht 5' (1.524 m)  Wt 144 lb (65.318 kg)  BMI 28.12 kg/m2       Objective:   Physical Exam  Constitutional: She appears well-nourished. No distress.  HENT:  Head: Normocephalic.  Eyes: Conjunctivae and EOM are normal.  Neck: Neck supple. JVD present.  Cardiovascular:  Murmur heard. Pulmonary/Chest: Effort normal and breath sounds normal.  Abdominal: Soft.  Musculoskeletal: She exhibits no edema and no tenderness.  Neurological: She has normal reflexes. Coordination normal.  Skin: Skin is warm and dry.          Assessment & Plan:  Up to date on lbas and monitoring Stable blood pressure Currently stable diabetes with good A1c.  Cholesterol at goal.  We'll monitor her renal clinic and at that her endocrinologist.  She'll require appropriate immunization  today we reviewed all of her immunization she had her flu shot done at her endocrinologist that she needs Patanol for pneumonia and she needs a tetanus shot at that point she will be up to date with immunizations and health maintenance

## 2013-05-31 DIAGNOSIS — N186 End stage renal disease: Secondary | ICD-10-CM | POA: Diagnosis not present

## 2013-06-03 DIAGNOSIS — D631 Anemia in chronic kidney disease: Secondary | ICD-10-CM | POA: Diagnosis not present

## 2013-06-03 DIAGNOSIS — N186 End stage renal disease: Secondary | ICD-10-CM | POA: Diagnosis not present

## 2013-06-03 DIAGNOSIS — N2581 Secondary hyperparathyroidism of renal origin: Secondary | ICD-10-CM | POA: Diagnosis not present

## 2013-06-26 DIAGNOSIS — E11359 Type 2 diabetes mellitus with proliferative diabetic retinopathy without macular edema: Secondary | ICD-10-CM | POA: Diagnosis not present

## 2013-06-26 DIAGNOSIS — E1139 Type 2 diabetes mellitus with other diabetic ophthalmic complication: Secondary | ICD-10-CM | POA: Diagnosis not present

## 2013-06-26 DIAGNOSIS — H4720 Unspecified optic atrophy: Secondary | ICD-10-CM | POA: Diagnosis not present

## 2013-06-26 DIAGNOSIS — H4011X0 Primary open-angle glaucoma, stage unspecified: Secondary | ICD-10-CM | POA: Diagnosis not present

## 2013-06-29 DIAGNOSIS — N186 End stage renal disease: Secondary | ICD-10-CM | POA: Diagnosis not present

## 2013-06-29 DIAGNOSIS — E1129 Type 2 diabetes mellitus with other diabetic kidney complication: Secondary | ICD-10-CM | POA: Diagnosis not present

## 2013-07-01 DIAGNOSIS — N186 End stage renal disease: Secondary | ICD-10-CM | POA: Diagnosis not present

## 2013-07-04 DIAGNOSIS — N2581 Secondary hyperparathyroidism of renal origin: Secondary | ICD-10-CM | POA: Diagnosis not present

## 2013-07-04 DIAGNOSIS — D631 Anemia in chronic kidney disease: Secondary | ICD-10-CM | POA: Diagnosis not present

## 2013-07-04 DIAGNOSIS — N186 End stage renal disease: Secondary | ICD-10-CM | POA: Diagnosis not present

## 2013-07-05 DIAGNOSIS — IMO0001 Reserved for inherently not codable concepts without codable children: Secondary | ICD-10-CM | POA: Diagnosis not present

## 2013-07-21 ENCOUNTER — Encounter: Payer: Self-pay | Admitting: Family

## 2013-07-21 ENCOUNTER — Ambulatory Visit (INDEPENDENT_AMBULATORY_CARE_PROVIDER_SITE_OTHER): Payer: Medicare Other | Admitting: Family

## 2013-07-21 VITALS — BP 132/62 | HR 102 | Temp 99.1°F | Wt 144.0 lb

## 2013-07-21 DIAGNOSIS — R509 Fever, unspecified: Secondary | ICD-10-CM

## 2013-07-21 DIAGNOSIS — Z992 Dependence on renal dialysis: Secondary | ICD-10-CM

## 2013-07-21 DIAGNOSIS — N186 End stage renal disease: Secondary | ICD-10-CM | POA: Diagnosis not present

## 2013-07-21 DIAGNOSIS — R319 Hematuria, unspecified: Secondary | ICD-10-CM

## 2013-07-21 LAB — CBC WITH DIFFERENTIAL/PLATELET
BASOS PCT: 0.2 % (ref 0.0–3.0)
Basophils Absolute: 0 10*3/uL (ref 0.0–0.1)
Eosinophils Absolute: 0 10*3/uL (ref 0.0–0.7)
Eosinophils Relative: 0.4 % (ref 0.0–5.0)
HEMATOCRIT: 35.5 % — AB (ref 36.0–46.0)
HEMOGLOBIN: 11.5 g/dL — AB (ref 12.0–15.0)
LYMPHS ABS: 1.3 10*3/uL (ref 0.7–4.0)
LYMPHS PCT: 14.2 % (ref 12.0–46.0)
MCHC: 32.4 g/dL (ref 30.0–36.0)
MCV: 93.3 fl (ref 78.0–100.0)
MONOS PCT: 8 % (ref 3.0–12.0)
Monocytes Absolute: 0.7 10*3/uL (ref 0.1–1.0)
NEUTROS ABS: 7.2 10*3/uL (ref 1.4–7.7)
Neutrophils Relative %: 77.2 % — ABNORMAL HIGH (ref 43.0–77.0)
Platelets: 96 10*3/uL — ABNORMAL LOW (ref 150.0–400.0)
RBC: 3.8 Mil/uL — AB (ref 3.87–5.11)
RDW: 19 % — AB (ref 11.5–14.6)
WBC: 9.3 10*3/uL (ref 4.5–10.5)

## 2013-07-21 MED ORDER — NITROFURANTOIN MONOHYD MACRO 100 MG PO CAPS: 100.0000 mg | ORAL_CAPSULE | Freq: Two times a day (BID) | ORAL | Status: DC

## 2013-07-21 NOTE — Patient Instructions (Signed)
Urinary Tract Infection  Urinary tract infections (UTIs) can develop anywhere along your urinary tract. Your urinary tract is your body's drainage system for removing wastes and extra water. Your urinary tract includes two kidneys, two ureters, a bladder, and a urethra. Your kidneys are a pair of bean-shaped organs. Each kidney is about the size of your fist. They are located below your ribs, one on each side of your spine.  CAUSES  Infections are caused by microbes, which are microscopic organisms, including fungi, viruses, and bacteria. These organisms are so small that they can only be seen through a microscope. Bacteria are the microbes that most commonly cause UTIs.  SYMPTOMS   Symptoms of UTIs may vary by age and gender of the patient and by the location of the infection. Symptoms in young women typically include a frequent and intense urge to urinate and a painful, burning feeling in the bladder or urethra during urination. Older women and men are more likely to be tired, shaky, and weak and have muscle aches and abdominal pain. A fever may mean the infection is in your kidneys. Other symptoms of a kidney infection include pain in your back or sides below the ribs, nausea, and vomiting.  DIAGNOSIS  To diagnose a UTI, your caregiver will ask you about your symptoms. Your caregiver also will ask to provide a urine sample. The urine sample will be tested for bacteria and white blood cells. White blood cells are made by your body to help fight infection.  TREATMENT   Typically, UTIs can be treated with medication. Because most UTIs are caused by a bacterial infection, they usually can be treated with the use of antibiotics. The choice of antibiotic and length of treatment depend on your symptoms and the type of bacteria causing your infection.  HOME CARE INSTRUCTIONS   If you were prescribed antibiotics, take them exactly as your caregiver instructs you. Finish the medication even if you feel better after you  have only taken some of the medication.   Drink enough water and fluids to keep your urine clear or pale yellow.   Avoid caffeine, tea, and carbonated beverages. They tend to irritate your bladder.   Empty your bladder often. Avoid holding urine for long periods of time.   Empty your bladder before and after sexual intercourse.   After a bowel movement, women should cleanse from front to back. Use each tissue only once.  SEEK MEDICAL CARE IF:    You have back pain.   You develop a fever.   Your symptoms do not begin to resolve within 3 days.  SEEK IMMEDIATE MEDICAL CARE IF:    You have severe back pain or lower abdominal pain.   You develop chills.   You have nausea or vomiting.   You have continued burning or discomfort with urination.  MAKE SURE YOU:    Understand these instructions.   Will watch your condition.   Will get help right away if you are not doing well or get worse.  Document Released: 02/25/2005 Document Revised: 11/17/2011 Document Reviewed: 06/26/2011  [employer] Patient Information 2014 [employer]

## 2013-07-21 NOTE — Progress Notes (Signed)
Pre visit review using our clinic review tool, if applicable. No additional management support is needed unless otherwise documented below in the visit note. 

## 2013-07-21 NOTE — Progress Notes (Signed)
Subjective:    Patient ID: [name], female    DOB: 16-Jun-1928, 80 y.o.   MRN: [id number]  HPI 80 year old African American female, with a history of end-stage renal disease presents today with complaints of blood in her urine, nausea, vomiting, and low-grade fever. Her daughter is here concerned that typically when this occurs [name] is having a urinary tract infection. However, it's difficult to obtain urine because he rarely produces urine. Denies any abdominal pain or back pain.   Review of Systems  Constitutional: Negative.   HENT: Negative.   Respiratory: Negative.   Cardiovascular: Negative.   Gastrointestinal: Negative for abdominal pain and abdominal distention.  Genitourinary: Positive for hematuria. Negative for urgency.  Musculoskeletal: Negative.   Skin: Negative.   Neurological: Negative.   Psychiatric/Behavioral: Negative.    Past Medical History  Diagnosis Date  . Cough   . Diabetes mellitus     type 2  . GERD (gastroesophageal reflux disease)   . Hypertension   . Bronchitis   . Osteoarthritis   . End stage renal disease   . Hemodialysis patient     esrd    History   Social History  . Marital Status: Married    Spouse Name: N/A    Number of Children: N/A  . Years of Education: N/A   Occupational History  . Not on file.   Social History Main Topics  . Smoking status: Never Smoker   . Smokeless tobacco: Not on file  . Alcohol Use: No  . Drug Use: No  . Sexual Activity: No   Other Topics Concern  . Not on file   Social History Narrative  . No narrative on file    Past Surgical History  Procedure Laterality Date  . Eye surgery      Cataract extraction    Family History  Problem Relation Age of Onset  . Hypertension Mother     No Known Allergies  Current Outpatient Prescriptions on File Prior to Visit  Medication Sig Dispense Refill  . Acetaminophen (TYLENOL EXTRA STRENGTH) 167 MG/5ML LIQD Take by mouth as needed.        [name]  amLODipine (NORVASC) 5 MG tablet Take 1 tablet (5 mg total) by mouth daily.  30 tablet  3  . aspirin 81 MG tablet Take 81 mg by mouth daily.      . cinacalcet (SENSIPAR) 30 MG tablet Take 30 mg by mouth daily.      . Dorzolamide HCl-Timolol Mal (COSOPT OP) Apply to eye daily.        [name] escitalopram (LEXAPRO) 10 MG tablet TAKE ONE-HALF TABLET BY MOUTH EVERY DAY  90 tablet  0  . folic acid-vitamin b complex-vitamin c-selenium-zinc (DIALYVITE) 3 MG TABS Take 1 tablet by mouth daily.        [name] levocetirizine (XYZAL) 5 MG tablet Take 5 mg by mouth daily. 1/2 once daily       . NEXIUM 40 MG capsule TAKE 1 CAPSULE EVERY DAY  90 capsule  1  . ondansetron (ZOFRAN) 4 MG tablet Take 4 mg by mouth every 6 (six) hours as needed. For nausea and vomitting       . quiNINE (QUALAQUIN) 324 MG capsule TAKE TWO CAPSULES BY MOUTH AS NEEDED FOR LEG CRAMPS  60 capsule  3  . sevelamer (RENAGEL) 800 MG tablet Take 800 mg by mouth daily.        . simvastatin (ZOCOR) 80 MG tablet TAKE ONE TABLET BY MOUTH  EVERY DAY  30 tablet  11  . traMADol (ULTRAM) 50 MG tablet Take 1 tablet (50 mg total) by mouth 2 (two) times daily.  30 tablet  3   No current facility-administered medications on file prior to visit.    BP 132/62  Pulse 102  Temp(Src) 99.1 F (37.3 C) (Oral)  Wt 144 lb (65.318 kg)chart    Objective:   Physical Exam  Constitutional: [name] is oriented to person, place, and time. [name] appears well-developed and well-nourished.  HENT:  Right Ear: External ear normal.  Left Ear: External ear normal.  Nose: Nose normal.  Mouth/Throat: Oropharynx is clear and moist.  Neck: Normal range of motion. Neck supple.  Pulmonary/Chest: Breath sounds normal.  Musculoskeletal: Normal range of motion.  Neurological: [name] is alert and oriented to person, place, and time.  Skin: Skin is warm and dry.  Psychiatric: [name] has a normal mood and affect.          Assessment & Plan:  [name] was seen today for emesis and  hematuria.  Diagnoses and associated orders for this visit:  Hematuria - CBC with Differential  Other Orders - nitrofurantoin, macrocrystal-monohydrate, (MACROBID) 100 MG capsule; Take 1 capsule (100 mg total) by mouth 2 (two) times daily.    urine collection kit sent home with patient today. Drop up when necessary if able to collect. Recheck as scheduled.

## 2013-07-24 ENCOUNTER — Encounter (HOSPITAL_COMMUNITY): Payer: Self-pay | Admitting: Emergency Medicine

## 2013-07-24 ENCOUNTER — Emergency Department (HOSPITAL_COMMUNITY): Payer: Medicare Other

## 2013-07-24 ENCOUNTER — Telehealth: Payer: Self-pay | Admitting: Internal Medicine

## 2013-07-24 ENCOUNTER — Inpatient Hospital Stay (HOSPITAL_COMMUNITY)
Admission: EM | Admit: 2013-07-24 | Discharge: 2013-07-28 | DRG: 391 | Disposition: A | Discharge status: Home / Self Care | Payer: Medicare Other | Attending: Internal Medicine | Admitting: Internal Medicine

## 2013-07-24 DIAGNOSIS — M1990 Unspecified osteoarthritis, unspecified site: Secondary | ICD-10-CM | POA: Diagnosis present

## 2013-07-24 DIAGNOSIS — F329 Major depressive disorder, single episode, unspecified: Secondary | ICD-10-CM | POA: Diagnosis present

## 2013-07-24 DIAGNOSIS — N390 Urinary tract infection, site not specified: Secondary | ICD-10-CM

## 2013-07-24 DIAGNOSIS — D6949 Other primary thrombocytopenia: Secondary | ICD-10-CM | POA: Diagnosis present

## 2013-07-24 DIAGNOSIS — I120 Hypertensive chronic kidney disease with stage 5 chronic kidney disease or end stage renal disease: Secondary | ICD-10-CM | POA: Diagnosis present

## 2013-07-24 DIAGNOSIS — F3289 Other specified depressive episodes: Secondary | ICD-10-CM | POA: Diagnosis present

## 2013-07-24 DIAGNOSIS — Z992 Dependence on renal dialysis: Secondary | ICD-10-CM

## 2013-07-24 DIAGNOSIS — R413 Other amnesia: Secondary | ICD-10-CM

## 2013-07-24 DIAGNOSIS — M899 Disorder of bone, unspecified: Secondary | ICD-10-CM | POA: Diagnosis present

## 2013-07-24 DIAGNOSIS — K8020 Calculus of gallbladder without cholecystitis without obstruction: Secondary | ICD-10-CM | POA: Diagnosis not present

## 2013-07-24 DIAGNOSIS — D649 Anemia, unspecified: Secondary | ICD-10-CM | POA: Diagnosis present

## 2013-07-24 DIAGNOSIS — K5289 Other specified noninfective gastroenteritis and colitis: Principal | ICD-10-CM | POA: Diagnosis present

## 2013-07-24 DIAGNOSIS — K219 Gastro-esophageal reflux disease without esophagitis: Secondary | ICD-10-CM | POA: Diagnosis not present

## 2013-07-24 DIAGNOSIS — Z79899 Other long term (current) drug therapy: Secondary | ICD-10-CM | POA: Diagnosis not present

## 2013-07-24 DIAGNOSIS — I10 Essential (primary) hypertension: Secondary | ICD-10-CM

## 2013-07-24 DIAGNOSIS — R197 Diarrhea, unspecified: Secondary | ICD-10-CM | POA: Diagnosis not present

## 2013-07-24 DIAGNOSIS — H547 Unspecified visual loss: Secondary | ICD-10-CM

## 2013-07-24 DIAGNOSIS — F411 Generalized anxiety disorder: Secondary | ICD-10-CM | POA: Diagnosis present

## 2013-07-24 DIAGNOSIS — N186 End stage renal disease: Secondary | ICD-10-CM

## 2013-07-24 DIAGNOSIS — N2581 Secondary hyperparathyroidism of renal origin: Secondary | ICD-10-CM | POA: Diagnosis present

## 2013-07-24 DIAGNOSIS — E785 Hyperlipidemia, unspecified: Secondary | ICD-10-CM

## 2013-07-24 DIAGNOSIS — Z7982 Long term (current) use of aspirin: Secondary | ICD-10-CM

## 2013-07-24 DIAGNOSIS — R1110 Vomiting, unspecified: Secondary | ICD-10-CM

## 2013-07-24 DIAGNOSIS — E119 Type 2 diabetes mellitus without complications: Secondary | ICD-10-CM

## 2013-07-24 DIAGNOSIS — E1121 Type 2 diabetes mellitus with diabetic nephropathy: Secondary | ICD-10-CM | POA: Diagnosis present

## 2013-07-24 DIAGNOSIS — D509 Iron deficiency anemia, unspecified: Secondary | ICD-10-CM

## 2013-07-24 DIAGNOSIS — R112 Nausea with vomiting, unspecified: Secondary | ICD-10-CM

## 2013-07-24 DIAGNOSIS — I517 Cardiomegaly: Secondary | ICD-10-CM | POA: Diagnosis not present

## 2013-07-24 DIAGNOSIS — M949 Disorder of cartilage, unspecified: Secondary | ICD-10-CM | POA: Diagnosis present

## 2013-07-24 DIAGNOSIS — I951 Orthostatic hypotension: Secondary | ICD-10-CM

## 2013-07-24 LAB — COMPREHENSIVE METABOLIC PANEL
ALBUMIN: 3.4 g/dL — AB (ref 3.5–5.2)
ALT: 10 U/L (ref 0–35)
AST: 14 U/L (ref 0–37)
Alkaline Phosphatase: 122 U/L — ABNORMAL HIGH (ref 39–117)
BILIRUBIN TOTAL: 0.9 mg/dL (ref 0.3–1.2)
BUN: 42 mg/dL — AB (ref 6–23)
CALCIUM: 9.5 mg/dL (ref 8.4–10.5)
CO2: 26 mEq/L (ref 19–32)
CREATININE: 7.35 mg/dL — AB (ref 0.50–1.10)
Chloride: 91 mEq/L — ABNORMAL LOW (ref 96–112)
GFR calc Af Amer: 5 mL/min — ABNORMAL LOW (ref >90)
GFR calc non Af Amer: 5 mL/min — ABNORMAL LOW (ref >90)
Glucose, Bld: 181 mg/dL — ABNORMAL HIGH (ref 70–99)
Potassium: 3.4 mEq/L — ABNORMAL LOW (ref 3.7–5.3)
Sodium: 141 mEq/L (ref 137–147)
TOTAL PROTEIN: 8.4 g/dL — AB (ref 6.0–8.3)

## 2013-07-24 LAB — CBC WITH DIFFERENTIAL/PLATELET
BASOS ABS: 0 10*3/uL (ref 0.0–0.1)
BASOS PCT: 0 % (ref 0–1)
EOS ABS: 0.1 10*3/uL (ref 0.0–0.7)
EOS PCT: 1 % (ref 0–5)
HEMATOCRIT: 36.5 % (ref 36.0–46.0)
HEMOGLOBIN: 11.8 g/dL — AB (ref 12.0–15.0)
Lymphocytes Relative: 26 % (ref 12–46)
Lymphs Abs: 1.5 10*3/uL (ref 0.7–4.0)
MCH: 29.8 pg (ref 26.0–34.0)
MCHC: 32.3 g/dL (ref 30.0–36.0)
MCV: 92.2 fL (ref 78.0–100.0)
MONO ABS: 0.7 10*3/uL (ref 0.1–1.0)
MONOS PCT: 12 % (ref 3–12)
Neutro Abs: 3.6 10*3/uL (ref 1.7–7.7)
Neutrophils Relative %: 62 % (ref 43–77)
Platelets: 107 10*3/uL — ABNORMAL LOW (ref 150–400)
RBC: 3.96 MIL/uL (ref 3.87–5.11)
RDW: 17.1 % — AB (ref 11.5–15.5)
WBC: 5.9 10*3/uL (ref 4.0–10.5)

## 2013-07-24 LAB — I-STAT CG4 LACTIC ACID, ED: Lactic Acid, Venous: 1.63 mmol/L (ref 0.5–2.2)

## 2013-07-24 LAB — I-STAT TROPONIN, ED: TROPONIN I, POC: 0.09 ng/mL — AB (ref 0.00–0.08)

## 2013-07-24 LAB — TROPONIN I: Troponin I: <0.3 ng/mL (ref <0.30)

## 2013-07-24 LAB — LIPASE, BLOOD: Lipase: 114 U/L — ABNORMAL HIGH (ref 11–59)

## 2013-07-24 MED ORDER — ONDANSETRON 4 MG PO TBDP
8.0000 mg | ORAL_TABLET | Freq: Once | ORAL | Status: AC
  Administered 2013-07-24: 8 mg via ORAL
  Filled 2013-07-24: qty 2

## 2013-07-24 MED ORDER — IOHEXOL 300 MG/ML  SOLN
100.0000 mL | Freq: Once | INTRAMUSCULAR | Status: AC | PRN
  Administered 2013-07-24: 100 mL via INTRAVENOUS

## 2013-07-24 MED ORDER — DEXTROSE 5 % IV SOLN
1.0000 g | Freq: Once | INTRAVENOUS | Status: AC
  Administered 2013-07-25: 1 g via INTRAVENOUS
  Filled 2013-07-24: qty 10

## 2013-07-24 MED ORDER — SODIUM CHLORIDE 0.9 % IV BOLUS (SEPSIS)
1000.0000 mL | Freq: Once | INTRAVENOUS | Status: AC
  Administered 2013-07-24: 1000 mL via INTRAVENOUS

## 2013-07-24 MED ORDER — IOHEXOL 300 MG/ML  SOLN: 25.0000 mL | INTRAMUSCULAR | Status: AC

## 2013-07-24 NOTE — ED Notes (Signed)
Phlebotomy at bedside.

## 2013-07-24 NOTE — Telephone Encounter (Signed)
Caller: [name]/Child; Phone: [phone]; Reason for Call: FYI: Patient is continuing to have diarrhea and vomiting today so they are taking her to the ED.

## 2013-07-24 NOTE — ED Notes (Signed)
Patient has completed approx 1/2 of her oral contrast

## 2013-07-24 NOTE — ED Notes (Signed)
Family reports that pt was taken to PCP and given antibiotics for a UTI. Reports that pt is still vomiting and having episodes of diarrhea. PCP states that pt is most likely dehydrated. Skin warm and dry. Pt goes for dialysis on Tuesday, Thursday and Saturday. Pt alert to baseline at triage

## 2013-07-24 NOTE — ED Notes (Addendum)
Went in to obtain EKG and patient at CT, will obtain upon patient arrival back to room

## 2013-07-24 NOTE — ED Provider Notes (Signed)
CSN: [id number]     Arrival date & time 07/24/13  1735 History   First MD Initiated Contact with Patient 07/24/13 1842     Chief Complaint  Patient presents with  . Nausea  . Diarrhea     HPI  80 yo F with pmh of DM, HTN, ESRD on dialysis (t, th, s) who presents with one week hx of emesis. Emesis is nbnb. Have promethazine at home which has helped. Seen by PCP on Friday who diagnosed her with UTI and started her on Macrobid. The following day she developed diarrhea. Diarrhea with no noted blood. She denies abd pain, dysuria, fevers, sob, CP, HA, cough, congestion, HA.  Patient makes small amount of urine most days.   Past Medical History  Diagnosis Date  . Cough   . Diabetes mellitus     type 2  . GERD (gastroesophageal reflux disease)   . Hypertension   . Bronchitis   . Osteoarthritis   . End stage renal disease   . Hemodialysis patient     esrd   Past Surgical History  Procedure Laterality Date  . Eye surgery      Cataract extraction   Family History  Problem Relation Age of Onset  . Hypertension Mother    History  Substance Use Topics  . Smoking status: Never Smoker   . Smokeless tobacco: Not on file  . Alcohol Use: No   OB History   Grav Para Term Preterm Abortions TAB SAB Ect Mult Living                 Review of Systems  Constitutional: Negative for fever, chills, activity change and appetite change.  HENT: Negative for congestion, ear pain and rhinorrhea.   Eyes: Negative for pain.  Respiratory: Negative for cough and shortness of breath.   Cardiovascular: Negative for chest pain and palpitations.  Gastrointestinal: Positive for nausea, vomiting and diarrhea. Negative for abdominal pain.  Genitourinary: Negative for dysuria, difficulty urinating and pelvic pain.  Musculoskeletal: Negative for back pain and neck pain.  Skin: Negative for rash and wound.  Neurological: Negative for weakness and headaches.  Psychiatric/Behavioral: Negative for behavioral  problems, confusion and agitation.      Allergies  Review of patient's allergies indicates no known allergies.  Home Medications   Current Outpatient Rx  Name  Route  Sig  Dispense  Refill  . Acetaminophen (TYLENOL EXTRA STRENGTH) 167 MG/5ML LIQD   Oral   Take 5 mLs by mouth daily as needed. For pain/fever         . amLODipine (NORVASC) 5 MG tablet   Oral   Take 2.5 mg by mouth daily. Take half tablet on Sundays mondays wednesdays and fridays         . aspirin 81 MG tablet   Oral   Take 81 mg by mouth daily.         . cinacalcet (SENSIPAR) 30 MG tablet   Oral   Take 30 mg by mouth daily.         . Dorzolamide HCl-Timolol Mal (COSOPT OP)   Both Eyes   Place 1 drop into both eyes daily.          [name] escitalopram (LEXAPRO) 10 MG tablet      TAKE ONE-HALF TABLET BY MOUTH EVERY DAY         . folic acid-vitamin b complex-vitamin c-selenium-zinc (DIALYVITE) 3 MG TABS   Oral   Take 1 tablet by mouth daily.           [name]  levocetirizine (XYZAL) 5 MG tablet   Oral   Take 5 mg by mouth daily. 1/2 once daily         . NEXIUM 40 MG capsule      TAKE 1 CAPSULE EVERY DAY   90 capsule   1   . nitrofurantoin, macrocrystal-monohydrate, (MACROBID) 100 MG capsule   Oral   Take 1 capsule (100 mg total) by mouth 2 (two) times daily.   14 capsule   0   . promethazine (PHENERGAN) 25 MG tablet   Oral   Take 12.5 mg by mouth every 6 (six) hours as needed for nausea or vomiting.         . quiNINE (QUALAQUIN) 324 MG capsule   Oral   Take 648 mg by mouth 3 (three) times daily. Take on tuesdays thursdays and saturdays         . sevelamer (RENAGEL) 800 MG tablet   Oral   Take 800 mg by mouth 4 (four) times daily.          . simvastatin (ZOCOR) 80 MG tablet      TAKE ONE TABLET BY MOUTH EVERY DAY   30 tablet   11    BP 183/65  Pulse 74  Temp(Src) 99.4 F (37.4 C) (Oral)  Resp 11  SpO2 100% Physical Exam  Constitutional: She is oriented to person,  place, and time. She appears well-developed and well-nourished. No distress.  HENT:  Head: Normocephalic and atraumatic.  Nose: Nose normal.  Mouth/Throat: Oropharynx is clear and moist.  Eyes: EOM are normal. Pupils are equal, round, and reactive to light.  Neck: Normal range of motion. Neck supple. No tracheal deviation present.  Cardiovascular: Regular rhythm, normal heart sounds and intact distal pulses.   Pulmonary/Chest: Effort normal and breath sounds normal. She has no rales.  Abdominal: Soft. Bowel sounds are normal. She exhibits no distension. There is tenderness ( RLQ ). There is no rebound and no guarding.  Musculoskeletal: Normal range of motion. She exhibits no tenderness.  Neurological: She is alert and oriented to person, place, and time.  Skin: Skin is warm and dry. No rash noted.  Psychiatric: She has a normal mood and affect. Her behavior is normal.    ED Course  Procedures (including critical care time) Labs Review  Results for orders placed during the hospital encounter of 07/24/13  CBC WITH DIFFERENTIAL      Result Value Ref Range   WBC 5.9  4.0 - 10.5 K/uL   RBC 3.96  3.87 - 5.11 MIL/uL   Hemoglobin 11.8 (*) 12.0 - 15.0 g/dL   HCT 40.936.5  81.136.0 - 91.446.0 %   MCV 92.2  78.0 - 100.0 fL   MCH 29.8  26.0 - 34.0 pg   MCHC 32.3  30.0 - 36.0 g/dL   RDW 78.217.1 (*) 95.611.5 - 21.315.5 %   Platelets 107 (*) 150 - 400 K/uL   Neutrophils Relative % 62  43 - 77 %   Neutro Abs 3.6  1.7 - 7.7 K/uL   Lymphocytes Relative 26  12 - 46 %   Lymphs Abs 1.5  0.7 - 4.0 K/uL   Monocytes Relative 12  3 - 12 %   Monocytes Absolute 0.7  0.1 - 1.0 K/uL   Eosinophils Relative 1  0 - 5 %   Eosinophils Absolute 0.1  0.0 - 0.7 K/uL   Basophils Relative 0  0 - 1 %   Basophils Absolute 0.0  0.0 -  0.1 K/uL  COMPREHENSIVE METABOLIC PANEL      Result Value Ref Range   Sodium 141  137 - 147 mEq/L   Potassium 3.4 (*) 3.7 - 5.3 mEq/L   Chloride 91 (*) 96 - 112 mEq/L   CO2 26  19 - 32 mEq/L   Glucose,  Bld 181 (*) 70 - 99 mg/dL   BUN 42 (*) 6 - 23 mg/dL   Creatinine, Ser 0.45 (*) 0.50 - 1.10 mg/dL   Calcium 9.5  8.4 - 40.9 mg/dL   Total Protein 8.4 (*) 6.0 - 8.3 g/dL   Albumin 3.4 (*) 3.5 - 5.2 g/dL   AST 14  0 - 37 U/L   ALT 10  0 - 35 U/L   Alkaline Phosphatase 122 (*) 39 - 117 U/L   Total Bilirubin 0.9  0.3 - 1.2 mg/dL   GFR calc non Af Amer 5 (*) >90 mL/min   GFR calc Af Amer 5 (*) >90 mL/min  LIPASE, BLOOD      Result Value Ref Range   Lipase 114 (*) 11 - 59 U/L  TROPONIN I      Result Value Ref Range   Troponin I <0.30  <0.30 ng/mL  I-STAT TROPOININ, ED      Result Value Ref Range   Troponin i, poc 0.09 (*) 0.00 - 0.08 ng/mL   Comment NOTIFIED PHYSICIAN     Comment 3           I-STAT CG4 LACTIC ACID, ED      Result Value Ref Range   Lactic Acid, Venous 1.63  0.5 - 2.2 mmol/L     Imaging Review Dg Chest 2 View  07/24/2013   CLINICAL DATA:  Nausea, vomiting.  Renal patient.  EXAM: CHEST  2 VIEW  COMPARISON:  07/04/2010 and prior chest radiographs  FINDINGS: Mild cardiomegaly noted.  A right central venous catheter is again noted with tip overlying the upper right atrium.  There is no evidence of focal airspace disease, pulmonary edema, suspicious pulmonary nodule/mass, pleural effusion, or pneumothorax. No acute bony abnormalities are identified.  IMPRESSION: No evidence of active cardiopulmonary disease.   Electronically Signed   By: Laveda Abbe M.D.   On: 07/24/2013 22:18   Ct Abdomen Pelvis W Contrast  07/24/2013   CLINICAL DATA:  80 year old female with abdominal and pelvic pain, vomiting and diarrhea. Patient with end-stage renal disease.  EXAM: CT ABDOMEN AND PELVIS WITH CONTRAST  TECHNIQUE: Multidetector CT imaging of the abdomen and pelvis was performed using the standard protocol following bolus administration of intravenous contrast.  CONTRAST:  OMNIPAQUE IOHEXOL 300 MG/ML  SOLN  COMPARISON:  11/25/2010 CTA  FINDINGS: The liver, spleen, pancreas, and adrenal glands  are unremarkable.  Enhancement of the right renal pelvis walls and right ureteral walls are noted compatible with infection/UTI. No definite evidence of pyelonephritis or renal abscess noted.  Severe bilateral renal atrophy is identified.  Circumferential bladder wall thickening is again noted and may be related to cystitis.  Cholelithiasis identified without CT evidence of acute cholecystitis.  There is no evidence of free fluid, enlarged lymph nodes, biliary dilation or abdominal aortic aneurysm.  There is no evidence of bowel obstruction, pneumoperitoneum or abscess.  The patient is status post hysterectomy.  No acute or suspicious bony abnormalities are identified. Mild compression of the L3 superior endplate is unchanged.  IMPRESSION: Diffuse enhancement of the right renal pelvis and right ureteral walls compatible with infection/ UTI. No definite evidence of  right pyelonephritis.  Circumferential bladder wall thickening which may represent cystitis.  Atrophic kidneys.  Cholelithiasis without CT evidence of acute cholecystitis.   Electronically Signed   By: Laveda Abbe M.D.   On: 07/24/2013 23:45    EKG Interpretation    Date/Time:  Monday July 24 2013 22:47:29 EST Ventricular Rate:  79 PR Interval:  140 QRS Duration: 89 QT Interval:  431 QTC Calculation: 494 R Axis:   47 Text Interpretation:  Sinus rhythm Abnormal R-wave progression, early transition Nonspecific T abnormalities, anterior leads Borderline prolonged QT interval No significant change since last tracing Confirmed by [facility]  MD, DAVID ([id number]) on 07/24/2013 10:50:37 PM            MDM   Final diagnoses:  Nausea and vomiting  UTI (urinary tract infection)   80 yo F with recurrent NBNB emesis x 1 week. She is in NAD. TTP to RLQ. CT scan with "Diffuse enhancement of the right renal pelvis and right ureteral walls compatible with infection/ UTI. No definite evidence of right pyelonephritis. Circumferential bladder wall  thickening which may represent cystitis." Urine collected and appears purulent. Will send off for UA. Given continued nausea and emesis will consult hospitalist team as patient will not tolerate out patient treatment of this UTI. Of note lipase was also elevated , cholelithiasis noted but no evidence of acute cholecystis. Urine cultures sent . Rocephin started. NS hydration given. Patient and family update multiple times in ED. Case co managed with my attending Dr. Preston Fleeting.   Case discussed with hospitalist on call. Plan to admit for further care of likely UTI and N/V.      Nadara Mustard, MD 07/25/13 [phone]

## 2013-07-24 NOTE — ED Notes (Signed)
[facility]

## 2013-07-24 NOTE — ED Notes (Signed)
Iv attempt x1 unsuccessful. IV team paged and responded.

## 2013-07-25 DIAGNOSIS — R112 Nausea with vomiting, unspecified: Secondary | ICD-10-CM | POA: Diagnosis not present

## 2013-07-25 DIAGNOSIS — R197 Diarrhea, unspecified: Secondary | ICD-10-CM | POA: Diagnosis not present

## 2013-07-25 DIAGNOSIS — N390 Urinary tract infection, site not specified: Secondary | ICD-10-CM | POA: Diagnosis not present

## 2013-07-25 DIAGNOSIS — E119 Type 2 diabetes mellitus without complications: Secondary | ICD-10-CM

## 2013-07-25 DIAGNOSIS — R1110 Vomiting, unspecified: Secondary | ICD-10-CM | POA: Diagnosis present

## 2013-07-25 DIAGNOSIS — N186 End stage renal disease: Secondary | ICD-10-CM | POA: Diagnosis not present

## 2013-07-25 DIAGNOSIS — K219 Gastro-esophageal reflux disease without esophagitis: Secondary | ICD-10-CM | POA: Diagnosis not present

## 2013-07-25 LAB — PROTIME-INR
INR: 1.3 (ref 0.00–1.49)
PROTHROMBIN TIME: 15.9 s — AB (ref 11.6–15.2)

## 2013-07-25 LAB — URINALYSIS, ROUTINE W REFLEX MICROSCOPIC
GLUCOSE, UA: 250 mg/dL — AB
KETONES UR: 15 mg/dL — AB
Nitrite: POSITIVE — AB
Protein, ur: >300 mg/dL — AB
Specific Gravity, Urine: 1.02 (ref 1.005–1.030)
Urobilinogen, UA: 4 mg/dL — ABNORMAL HIGH (ref 0.0–1.0)
pH: 7 (ref 5.0–8.0)

## 2013-07-25 LAB — COMPREHENSIVE METABOLIC PANEL
ALBUMIN: 2.9 g/dL — AB (ref 3.5–5.2)
ALT: 7 U/L (ref 0–35)
AST: 13 U/L (ref 0–37)
Alkaline Phosphatase: 113 U/L (ref 39–117)
BUN: 46 mg/dL — ABNORMAL HIGH (ref 6–23)
CHLORIDE: 94 meq/L — AB (ref 96–112)
CO2: 20 mEq/L (ref 19–32)
Calcium: 8.7 mg/dL (ref 8.4–10.5)
Creatinine, Ser: 8.84 mg/dL — ABNORMAL HIGH (ref 0.50–1.10)
GFR calc non Af Amer: 4 mL/min — ABNORMAL LOW (ref >90)
GFR, EST AFRICAN AMERICAN: 4 mL/min — AB (ref >90)
GLUCOSE: 149 mg/dL — AB (ref 70–99)
POTASSIUM: 3.6 meq/L — AB (ref 3.7–5.3)
Sodium: 139 mEq/L (ref 137–147)
Total Bilirubin: 0.5 mg/dL (ref 0.3–1.2)
Total Protein: 7.2 g/dL (ref 6.0–8.3)

## 2013-07-25 LAB — RENAL FUNCTION PANEL
Albumin: 2.9 g/dL — ABNORMAL LOW (ref 3.5–5.2)
BUN: 46 mg/dL — AB (ref 6–23)
CHLORIDE: 94 meq/L — AB (ref 96–112)
CO2: 21 meq/L (ref 19–32)
CREATININE: 8.78 mg/dL — AB (ref 0.50–1.10)
Calcium: 8.8 mg/dL (ref 8.4–10.5)
GFR calc Af Amer: 4 mL/min — ABNORMAL LOW (ref >90)
GFR calc non Af Amer: 4 mL/min — ABNORMAL LOW (ref >90)
Glucose, Bld: 154 mg/dL — ABNORMAL HIGH (ref 70–99)
Phosphorus: 2.3 mg/dL (ref 2.3–4.6)
Potassium: 3.6 mEq/L — ABNORMAL LOW (ref 3.7–5.3)
Sodium: 139 mEq/L (ref 137–147)

## 2013-07-25 LAB — CBC
HCT: 30.9 % — ABNORMAL LOW (ref 36.0–46.0)
HEMOGLOBIN: 10.4 g/dL — AB (ref 12.0–15.0)
MCH: 30.1 pg (ref 26.0–34.0)
MCHC: 33.7 g/dL (ref 30.0–36.0)
MCV: 89.3 fL (ref 78.0–100.0)
Platelets: 108 10*3/uL — ABNORMAL LOW (ref 150–400)
RBC: 3.46 MIL/uL — ABNORMAL LOW (ref 3.87–5.11)
RDW: 17.4 % — ABNORMAL HIGH (ref 11.5–15.5)
WBC: 6.2 10*3/uL (ref 4.0–10.5)

## 2013-07-25 LAB — CLOSTRIDIUM DIFFICILE BY PCR: Toxigenic C. Difficile by PCR: NEGATIVE

## 2013-07-25 LAB — URINE MICROSCOPIC-ADD ON

## 2013-07-25 LAB — GLUCOSE, CAPILLARY
GLUCOSE-CAPILLARY: 101 mg/dL — AB (ref 70–99)
Glucose-Capillary: 104 mg/dL — ABNORMAL HIGH (ref 70–99)
Glucose-Capillary: 119 mg/dL — ABNORMAL HIGH (ref 70–99)

## 2013-07-25 LAB — MRSA PCR SCREENING: MRSA by PCR: NEGATIVE

## 2013-07-25 MED ORDER — ONDANSETRON HCL 4 MG/2ML IJ SOLN
4.0000 mg | Freq: Four times a day (QID) | INTRAMUSCULAR | Status: DC | PRN
  Administered 2013-07-26: 4 mg via INTRAVENOUS
  Filled 2013-07-25 (×2): qty 2

## 2013-07-25 MED ORDER — DOXERCALCIFEROL 4 MCG/2ML IV SOLN: 4.0000 ug | INTRAVENOUS | Status: DC

## 2013-07-25 MED ORDER — HEPARIN SODIUM (PORCINE) 1000 UNIT/ML DIALYSIS: 1000.0000 [IU] | INTRAMUSCULAR | Status: DC | PRN

## 2013-07-25 MED ORDER — SODIUM CHLORIDE 0.9 % IV SOLN: 100.0000 mL | INTRAVENOUS | Status: DC | PRN

## 2013-07-25 MED ORDER — ALTEPLASE 2 MG IJ SOLR
2.0000 mg | Freq: Once | INTRAMUSCULAR | Status: DC | PRN
  Filled 2013-07-25: qty 2

## 2013-07-25 MED ORDER — LIDOCAINE HCL (PF) 1 % IJ SOLN: 5.0000 mL | INTRAMUSCULAR | Status: DC | PRN

## 2013-07-25 MED ORDER — SEVELAMER CARBONATE 800 MG PO TABS
1600.0000 mg | ORAL_TABLET | Freq: Three times a day (TID) | ORAL | Status: DC
  Administered 2013-07-25 – 2013-07-26 (×3): 1600 mg via ORAL
  Filled 2013-07-25 (×7): qty 2

## 2013-07-25 MED ORDER — PANTOPRAZOLE SODIUM 40 MG IV SOLR
40.0000 mg | Freq: Two times a day (BID) | INTRAVENOUS | Status: DC
  Administered 2013-07-25 – 2013-07-26 (×3): 40 mg via INTRAVENOUS
  Filled 2013-07-25 (×4): qty 40

## 2013-07-25 MED ORDER — SODIUM CHLORIDE 0.9 % IJ SOLN
3.0000 mL | Freq: Two times a day (BID) | INTRAMUSCULAR | Status: DC
  Administered 2013-07-25 – 2013-07-27 (×5): 3 mL via INTRAVENOUS

## 2013-07-25 MED ORDER — ESCITALOPRAM OXALATE 5 MG PO TABS
5.0000 mg | ORAL_TABLET | Freq: Every day | ORAL | Status: DC
  Administered 2013-07-26 – 2013-07-28 (×3): 5 mg via ORAL
  Filled 2013-07-25 (×4): qty 1

## 2013-07-25 MED ORDER — ASPIRIN EC 81 MG PO TBEC
81.0000 mg | DELAYED_RELEASE_TABLET | Freq: Every day | ORAL | Status: DC
  Administered 2013-07-25 – 2013-07-28 (×4): 81 mg via ORAL
  Filled 2013-07-25 (×4): qty 1

## 2013-07-25 MED ORDER — AMLODIPINE BESYLATE 2.5 MG PO TABS
2.5000 mg | ORAL_TABLET | ORAL | Status: DC
  Administered 2013-07-26: 2.5 mg via ORAL
  Filled 2013-07-25: qty 1

## 2013-07-25 MED ORDER — HEPARIN SODIUM (PORCINE) 1000 UNIT/ML DIALYSIS
3500.0000 [IU] | Freq: Once | INTRAMUSCULAR | Status: DC
  Administered 2013-07-25: 3500 [IU] via INTRAVENOUS_CENTRAL

## 2013-07-25 MED ORDER — ATORVASTATIN CALCIUM 40 MG PO TABS
40.0000 mg | ORAL_TABLET | Freq: Every day | ORAL | Status: DC
  Administered 2013-07-26 – 2013-07-27 (×2): 40 mg via ORAL
  Filled 2013-07-25 (×4): qty 1

## 2013-07-25 MED ORDER — RENA-VITE PO TABS
1.0000 | ORAL_TABLET | Freq: Every day | ORAL | Status: DC
  Administered 2013-07-25 – 2013-07-26 (×2): via ORAL
  Administered 2013-07-27: 1 via ORAL
  Filled 2013-07-25 (×4): qty 1

## 2013-07-25 MED ORDER — SODIUM CHLORIDE 0.9 % IV SOLN
INTRAVENOUS | Status: DC
  Administered 2013-07-25: 04:00:00 via INTRAVENOUS

## 2013-07-25 MED ORDER — ASPIRIN 81 MG PO TABS: 81.0000 mg | ORAL_TABLET | Freq: Every day | ORAL | Status: DC

## 2013-07-25 MED ORDER — HEPARIN SODIUM (PORCINE) 5000 UNIT/ML IJ SOLN
5000.0000 [IU] | Freq: Three times a day (TID) | INTRAMUSCULAR | Status: DC
  Administered 2013-07-25 – 2013-07-27 (×9): 5000 [IU] via SUBCUTANEOUS
  Filled 2013-07-25 (×13): qty 1

## 2013-07-25 MED ORDER — LIDOCAINE-PRILOCAINE 2.5-2.5 % EX CREA: 1.0000 "application " | TOPICAL_CREAM | CUTANEOUS | Status: DC | PRN

## 2013-07-25 MED ORDER — CINACALCET HCL 30 MG PO TABS
30.0000 mg | ORAL_TABLET | Freq: Every day | ORAL | Status: DC
  Administered 2013-07-25 – 2013-07-28 (×4): 30 mg via ORAL
  Filled 2013-07-25 (×5): qty 1

## 2013-07-25 MED ORDER — ONDANSETRON HCL 4 MG/2ML IJ SOLN
INTRAMUSCULAR | Status: AC
  Administered 2013-07-25: 4 mg
  Filled 2013-07-25: qty 2

## 2013-07-25 MED ORDER — ONDANSETRON HCL 4 MG PO TABS: 4.0000 mg | ORAL_TABLET | Freq: Four times a day (QID) | ORAL | Status: DC | PRN

## 2013-07-25 MED ORDER — QUININE SULFATE 324 MG PO CAPS
648.0000 mg | ORAL_CAPSULE | ORAL | Status: DC
  Administered 2013-07-25 – 2013-07-27 (×5): 648 mg via ORAL
  Filled 2013-07-25 (×6): qty 2

## 2013-07-25 MED ORDER — DEXTROSE 5 % IV SOLN
1.0000 g | Freq: Every day | INTRAVENOUS | Status: DC
  Administered 2013-07-25 – 2013-07-26 (×2): 1 g via INTRAVENOUS
  Filled 2013-07-25 (×4): qty 10

## 2013-07-25 MED ORDER — PENTAFLUOROPROP-TETRAFLUOROETH EX AERO: 1.0000 "application " | INHALATION_SPRAY | CUTANEOUS | Status: DC | PRN

## 2013-07-25 MED ORDER — NEPRO/CARBSTEADY PO LIQD: 237.0000 mL | ORAL | Status: DC | PRN

## 2013-07-25 NOTE — Consult Note (Signed)
Indication for Consultation:  Management of ESRD/hemodialysis; anemia, hypertension/volume and secondary hyperparathyroidism  HPI: [name] is a 80 y.o. female who presented to the ED with complaints of nausea/vomiting and fatigue for the past week. She saw her PCP Friday and was started on Macrobid and subsequently developed diarrhea. [facility], history of HTN and DM. She has not been able to tolerate po intake since Friday. She was admitted for further eval  Past Medical History  Diagnosis Date  . Cough   . Diabetes mellitus     type 2  . GERD (gastroesophageal reflux disease)   . Hypertension   . Bronchitis   . Osteoarthritis   . End stage renal disease   . Hemodialysis patient     esrd   Past Surgical History  Procedure Laterality Date  . Eye surgery      Cataract extraction   Family History  Problem Relation Age of Onset  . Hypertension Mother    Social History:  reports that she has never smoked. She does not have any smokeless tobacco history on file. She reports that she does not drink alcohol or use illicit drugs. No Known Allergies Prior to Admission medications   Medication Sig Start Date End Date Taking? Authorizing Provider  Acetaminophen (TYLENOL EXTRA STRENGTH) 167 MG/5ML LIQD Take 5 mLs by mouth daily as needed. For pain/fever   Yes Historical Provider, MD  amLODipine (NORVASC) 5 MG tablet Take 2.5 mg by mouth daily. Take half tablet on Sundays mondays wednesdays and fridays 04/05/12  Yes Baker Pierini, FNP  aspirin 81 MG tablet Take 81 mg by mouth daily.   Yes Historical Provider, MD  cinacalcet (SENSIPAR) 30 MG tablet Take 30 mg by mouth daily.   Yes Historical Provider, MD  Dorzolamide HCl-Timolol Mal (COSOPT OP) Place 1 drop into both eyes daily.    Yes Historical Provider, MD  escitalopram (LEXAPRO) 10 MG tablet TAKE ONE-HALF TABLET BY MOUTH EVERY DAY 12/28/12  Yes Stacie Glaze, MD  folic acid-vitamin b complex-vitamin  c-selenium-zinc (DIALYVITE) 3 MG TABS Take 1 tablet by mouth daily.     Yes Historical Provider, MD  levocetirizine (XYZAL) 5 MG tablet Take 5 mg by mouth daily. 1/2 once daily   Yes Historical Provider, MD  NEXIUM 40 MG capsule TAKE 1 CAPSULE EVERY DAY 10/17/12  Yes Stacie Glaze, MD  nitrofurantoin, macrocrystal-monohydrate, (MACROBID) 100 MG capsule Take 1 capsule (100 mg total) by mouth 2 (two) times daily. 07/21/13  Yes Baker Pierini, FNP  promethazine (PHENERGAN) 25 MG tablet Take 12.5 mg by mouth every 6 (six) hours as needed for nausea or vomiting.   Yes Historical Provider, MD  quiNINE (QUALAQUIN) 324 MG capsule Take 648 mg by mouth 3 (three) times daily. Take on tuesdays thursdays and saturdays   Yes Historical Provider, MD  sevelamer (RENAGEL) 800 MG tablet Take 800 mg by mouth 4 (four) times daily.    Yes Historical Provider, MD  simvastatin (ZOCOR) 80 MG tablet TAKE ONE TABLET BY MOUTH EVERY DAY 01/09/13  Yes Stacie Glaze, MD   Current Facility-Administered Medications  Medication Dose Route Frequency Provider Last Rate Last Dose  . [START ON 07/26/2013] amLODipine (NORVASC) tablet 2.5 mg  2.5 mg Oral Once per day on Sun Mon Wed Fri Lynden Oxford, MD      . aspirin EC tablet 81 mg  81 mg Oral Daily Lynden Oxford, MD   81 mg at 07/25/13 0944  .  atorvastatin (LIPITOR) tablet 40 mg  40 mg Oral q1800 Lynden OxfordPranav Patel, MD      . cefTRIAXone (ROCEPHIN) 1 g in dextrose 5 % 50 mL IVPB  1 g Intravenous QHS Lynden OxfordPranav Patel, MD      . cinacalcet (SENSIPAR) tablet 30 mg  30 mg Oral Q breakfast Lynden OxfordPranav Patel, MD   30 mg at 07/25/13 0944  . escitalopram (LEXAPRO) tablet 5 mg  5 mg Oral Daily Lynden OxfordPranav Patel, MD      . heparin injection 5,000 Units  5,000 Units Subcutaneous 3 times per day Lynden OxfordPranav Patel, MD   5,000 Units at 07/25/13 16100622  . ondansetron (ZOFRAN) tablet 4 mg  4 mg Oral Q6H PRN Lynden OxfordPranav Patel, MD       Or  . ondansetron (ZOFRAN) injection 4 mg  4 mg Intravenous Q6H PRN Lynden OxfordPranav Patel, MD      .  pantoprazole (PROTONIX) injection 40 mg  40 mg Intravenous Q12H Lynden OxfordPranav Patel, MD   40 mg at 07/25/13 0945  . quiNINE (QUALAQUIN) capsule 648 mg  648 mg Oral 3 times per day on Tue Thu Sat Lynden OxfordPranav Patel, MD   648 mg at 07/25/13 0944  . sevelamer carbonate (RENVELA) tablet 1,600 mg  1,600 mg Oral TID WC Lynden OxfordPranav Patel, MD   1,600 mg at 07/25/13 0945  . sodium chloride 0.9 % injection 3 mL  3 mL Intravenous Q12H Lynden OxfordPranav Patel, MD   3 mL at 07/25/13 0343   Labs: Basic Metabolic Panel:  Recent Labs Lab 07/24/13 1810  NA 141  K 3.4*  CL 91*  CO2 26  GLUCOSE 181*  BUN 42*  CREATININE 7.35*  CALCIUM 9.5   Liver Function Tests:  Recent Labs Lab 07/24/13 1810  AST 14  ALT 10  ALKPHOS 122*  BILITOT 0.9  PROT 8.4*  ALBUMIN 3.4*    Recent Labs Lab 07/24/13 2241  LIPASE 114*   No results found for this basename: AMMONIA,  in the last 168 hours CBC:  Recent Labs Lab 07/21/13 1138 07/24/13 1810  WBC 9.3 5.9  NEUTROABS 7.2 3.6  HGB 11.5* 11.8*  HCT 35.5* 36.5  MCV 93.3 92.2  PLT 96.0* 107*   Cardiac Enzymes:  Recent Labs Lab 07/24/13 2241  TROPONINI <0.30   CBG:  Recent Labs Lab 07/25/13 0804 07/25/13 1123  GLUCAP 101* 104*   Iron Studies: No results found for this basename: IRON, TIBC, TRANSFERRIN, FERRITIN,  in the last 72 hours Studies/Results: Dg Chest 2 View  07/24/2013   CLINICAL DATA:  Nausea, vomiting.  Renal patient.  EXAM: CHEST  2 VIEW  COMPARISON:  07/04/2010 and prior chest radiographs  FINDINGS: Mild cardiomegaly noted.  A right central venous catheter is again noted with tip overlying the upper right atrium.  There is no evidence of focal airspace disease, pulmonary edema, suspicious pulmonary nodule/mass, pleural effusion, or pneumothorax. No acute bony abnormalities are identified.  IMPRESSION: No evidence of active cardiopulmonary disease.   Electronically Signed   By: Laveda AbbeJeff  Hu M.D.   On: 07/24/2013 22:18   Ct Abdomen Pelvis W Contrast  07/24/2013    CLINICAL DATA:  80 year old female with abdominal and pelvic pain, vomiting and diarrhea. Patient with end-stage renal disease.  EXAM: CT ABDOMEN AND PELVIS WITH CONTRAST  TECHNIQUE: Multidetector CT imaging of the abdomen and pelvis was performed using the standard protocol following bolus administration of intravenous contrast.  CONTRAST:  100mL OMNIPAQUE IOHEXOL 300 MG/ML  SOLN  COMPARISON:  11/25/2010 CTA  FINDINGS:  The liver, spleen, pancreas, and adrenal glands are unremarkable.  Enhancement of the right renal pelvis walls and right ureteral walls are noted compatible with infection/UTI. No definite evidence of pyelonephritis or renal abscess noted.  Severe bilateral renal atrophy is identified.  Circumferential bladder wall thickening is again noted and may be related to cystitis.  Cholelithiasis identified without CT evidence of acute cholecystitis.  There is no evidence of free fluid, enlarged lymph nodes, biliary dilation or abdominal aortic aneurysm.  There is no evidence of bowel obstruction, pneumoperitoneum or abscess.  The patient is status post hysterectomy.  No acute or suspicious bony abnormalities are identified. Mild compression of the L3 superior endplate is unchanged.  IMPRESSION: Diffuse enhancement of the right renal pelvis and right ureteral walls compatible with infection/ UTI. No definite evidence of right pyelonephritis.  Circumferential bladder wall thickening which may represent cystitis.  Atrophic kidneys.  Cholelithiasis without CT evidence of acute cholecystitis.   Electronically Signed   By: Laveda Abbe M.D.   On: 07/24/2013 23:45    ROS:  Review of Systems: Gen: Reports anorexia and fatigue. Denies any fever, chills, sweats, , weakness, malaise, weight loss, and sleep disorder HEENT: No visual complaints, No history of Retinopathy. Normal external appearance No Epistaxis or Sore throat. No sinusitis.   CV: Denies chest pain, angina, palpitations, syncope, orthopnea, PND,  peripheral edema, and claudication. Resp: Denies dyspnea at rest, dyspnea with exercise, cough, sputum, wheezing, coughing up blood, and pleurisy. GI: Reports nausea, vomiting and diarrhea. Denies abd pain and blood in stool.  GU : Reports occassional burning with urination, Denies blood in urine, urinary frequency, urinary hesitancy, nocturnal urination, and urinary incontinence.  No renal calculi. MS: Denies joint pain, limitation of movement, and swelling, stiffness, low back pain, extremity pain. Denies muscle weakness, cramps, atrophy.  No use of non steroidal antiinflammatory drugs. Derm: Denies rash, itching, dry skin, hives, moles, warts, or unhealing ulcers.  Psych: Denies depression, anxiety, memory loss, suicidal ideation, hallucinations, paranoia, and confusion. Heme: Denies bruising, bleeding, and enlarged lymph nodes. Neuro: No headache.  No diplopia. No dysarthria.  No dysphasia.  No history of CVA.  No Seizures. No paresthesias.  No weakness. Endocrine No DM.  No Thyroid disease.  No Adrenal disease.  Physical Exam: Filed Vitals:   07/25/13 0210 07/25/13 0528 07/25/13 0807 07/25/13 1126  BP: 109/56 126/56 109/54 147/65  Pulse: 85 86 83 78  Temp: 99.6 F (37.6 C) 98.4 F (36.9 C) 98.4 F (36.9 C) 98.6 F (37 C)  TempSrc: Oral Oral Oral Oral  Resp: 16 16 15 16   Height: 5\' 1"  (1.549 m)     Weight: 61.009 kg (134 lb 8 oz)     SpO2: 100% 97% 93% 97%     General: Well developed, well nourished, in no acute distress. Head: Normocephalic, atraumatic, sclera non-icteric, mucus membranes are moist Neck: Supple. JVD not elevated. Lungs: Clear bilaterally to auscultation without wheezes, rales, or rhonchi. Breathing is unlabored. Heart: RRR with S1 S2. No murmurs, rubs, or gallops appreciated. Abdomen: Soft, non-tender, non-distended with normoactive bowel sounds. No rebound/guarding. No obvious abdominal masses. M-S:  Strength and tone appear normal for age. Lower  extremities:without edema or ischemic changes, no open wounds  Neuro: Alert and oriented X 3. Moves all extremities spontaneously. Psych:  Responds to questions appropriately with a normal affect. Dialysis Access: R hero +brtui/thrill  Dialysis Orders: TTS @ [facility] 3:45  64kgs  2K/2.25  160  400/1.5HD  3500 Heparin  RHero  hectorol 4 mcg IV/HD  Profile 2  Assessment/Plan: 1. N/V/D- work up per primary. Cdiff negative. WBC 5.9 afebrile. zofran for nausea.  2. UTI- ABD CT- UTI, possible cystitis. no pyelonephritis. IV ceftriaxone 3. ESRD -  TTS @ [facility]. HD pending today. under EDW, need to lower at DC 4. Hypertension/volume  - 109/54, norvasc. No volume excess.  5. Anemia  - hgb 11.8, no outpt epo or iron. Watch CBC. Last tsat 31 6. Metabolic bone disease -  Ca+9.5. Sensipar, renvela with meals. Last PTH 1048, P 3.7 7. Nutrition - Alb 3.4, clear liquid. Multivit. Renal diet when advanced 8. DM- per primary  Jetty Duhamel, NP [facility] [phone] 07/25/2013, 12:25 PM   I have seen and examined patient, discussed with PA and agree with assessment and plan as outlined above. Vinson Moselle MD pager [phone]    cell [phone] 07/25/2013, 3:19 PM

## 2013-07-25 NOTE — Progress Notes (Signed)
INITIAL NUTRITION ASSESSMENT  DOCUMENTATION CODES Per approved criteria  -Severe malnutrition in the context of acute illness or injury   INTERVENTION: Recommend Nepro Shake po BID, each supplement provides 425 kcal and 19 grams protein, once diet advances. Monitor magnesium, potassium, and phosphorus daily for at least 3 days, MD to replete as needed, as pt is at risk for refeeding syndrome given severe malnutrition. RD to continue to follow nutrition care plan.  NUTRITION DIAGNOSIS: Inadequate oral intake related to GI distress as evidenced by poor po intake.   Goal: Further diet advancement. Intake to meet >90% of estimated nutrition needs.  Monitor:  weight trends, lab trends, I/O's, PO intake, supplement tolerance, further diet advancement  Reason for Assessment: Malnutrition Screening Tool  80 y.o. female  Admitting Dx: Vomiting  ASSESSMENT: PMHx significant for DM, GERD, HTN, osteoarthritis, ESRD (on HD.) Admitted with n/v/d also bloody urine. Work-up ongoing.  Pt with 5% wt loss x 2 weeks. Currently ordered for Clear Liquids - intake is minimal, consuming 5%. Pt is HOH. She states that she hasn't eaten hardly anything since her birthday, [birth year]. Family at bedside confirms this. She reports that the last time she vomited was last night.  Pt states that she likes Ensure and drinks it at home. Is agreeable to Nepro when diet allows.  Pt meets criteria for severe MALNUTRITION in the context of acute illness as evidenced by 5% wt loss x 2 weeks and intake of <50% x at least 5 days.  Potassium is currently low at 3.4.   Height: Ht Readings from Last 1 Encounters:  07/25/13 5\' 1"  (1.549 m)    Weight: Wt Readings from Last 1 Encounters:  07/25/13 134 lb 8 oz (61.009 kg)    Ideal Body Weight: 105 lb  % Ideal Body Weight: 128%  Wt Readings from Last 10 Encounters:  07/25/13 134 lb 8 oz (61.009 kg)  07/21/13 144 lb (65.318 kg)  05/12/13 144 lb (65.318 kg)   01/09/13 147 lb (66.679 kg)  07/18/12 147 lb (66.679 kg)  01/06/12 139 lb (63.05 kg)  09/02/11 147 lb (66.679 kg)  05/01/11 147 lb (66.679 kg)  12/29/10 150 lb (68.04 kg)  09/29/10 152 lb (68.947 kg)    Usual Body Weight: 64 kg  % Usual Body Weight: 95%  BMI:  Body mass index is 25.43 kg/(m^2). Overweight  Estimated Nutritional Needs: Kcal: 1600 - 1800 Protein: at least 73 g Fluid: 1.2 liters  Skin:  L heel ulcer  Diet Order: Clear Liquid  EDUCATION NEEDS: -No education needs identified at this time   Intake/Output Summary (Last 24 hours) at 07/25/13 1356 Last data filed at 07/25/13 1015  Gross per 24 hour  Intake    123 ml  Output      0 ml  Net    123 ml    Last BM: 2/24  Labs:   Recent Labs Lab 07/24/13 1810  NA 141  K 3.4*  CL 91*  CO2 26  BUN 42*  CREATININE 7.35*  CALCIUM 9.5  GLUCOSE 181*    CBG (last 3)   Recent Labs  07/25/13 0804 07/25/13 1123  GLUCAP 101* 104*    Scheduled Meds: . [START ON 07/26/2013] amLODipine  2.5 mg Oral Once per day on Sun Mon Wed Fri  . aspirin EC  81 mg Oral Daily  . atorvastatin  40 mg Oral q1800  . cefTRIAXone (ROCEPHIN)  IV  1 g Intravenous QHS  . cinacalcet  30 mg  Oral Q breakfast  . escitalopram  5 mg Oral Daily  . heparin  5,000 Units Subcutaneous 3 times per day  . multivitamin  1 tablet Oral QHS  . pantoprazole (PROTONIX) IV  40 mg Intravenous Q12H  . quiNINE  648 mg Oral 3 times per day on Tue Thu Sat  . sevelamer carbonate  1,600 mg Oral TID WC  . sodium chloride  3 mL Intravenous Q12H    Continuous Infusions:   Past Medical History  Diagnosis Date  . Cough   . Diabetes mellitus     type 2  . GERD (gastroesophageal reflux disease)   . Hypertension   . Bronchitis   . Osteoarthritis   . End stage renal disease   . Hemodialysis patient     esrd    Past Surgical History  Procedure Laterality Date  . Eye surgery      Cataract extraction    Jarold MottoSamantha Cybil [place] MS, RD,  LDN Inpatient Registered Dietitian Pager: [phone] After-hours pager: [phone]

## 2013-07-25 NOTE — Progress Notes (Signed)
Subjective: Patient seen and examined in HD, she was admitted this morning with vomiting, was found to have abnormal UA, started on rocephin. Urine culture os pending. Vomiting has resolved. Filed Vitals:   07/25/13 1615  BP: 147/64  Pulse: 79  Temp:   Resp:     Chest: Clear Bilaterally Heart : S1S2 RRR Abdomen: Soft, nontender Ext : No edema Neuro: Alert, oriented x 3  A/P UTI ESRD Vomiting- resolved Continue with IV rocephin, follow the urine culture results.    Meredeth IdeGagan S Shahzain Kiester [facility] [phone]- [phone]

## 2013-07-25 NOTE — ED Provider Notes (Signed)
80 year old female has had diarrhea and nausea for the past week. She has end-stage renal disease. There's been no fever or chills. On exam, she is chronically ill appearing. Lungs are clear and heart is regular rate and rhythm. Abdomen is mildly tender across the upper abdomen without rebound or guarding. Bowel sounds are decreased. Laboratory workup is significant for elevated lipase. She has had an elevated lipase in the past but today's value is significantly higher than previous episodes. Urinalysis is still pending but she has had to be admitted for urinary tract infections in the past. CT scan is suspicious for urinary tract infection. Gallstones also present but she does not clinically appear to have biliary colic. The stone that is present is fairly large and unlikely to cause gallstone pancreatitis. She is likely going to need to be admitted for careful monitoring of fluid status and IV antibiotics.  I saw and evaluated the patient, reviewed the resident's note and I agree with the findings and plan.  EKG Interpretation    Date/Time:  Monday July 24 2013 22:47:29 EST Ventricular Rate:  79 PR Interval:  140 QRS Duration: 89 QT Interval:  431 QTC Calculation: 494 R Axis:   47 Text Interpretation:  Sinus rhythm Abnormal R-wave progression, early transition Nonspecific T abnormalities, anterior leads Borderline prolonged QT interval No significant change since last tracing Confirmed by [facility]  MD, Tamanika Heiney ([id number]) on 07/24/2013 10:50:37 PM              Dione Boozeavid Joelly Bolanos, MD 07/25/13 0010

## 2013-07-25 NOTE — Progress Notes (Signed)
New Admission Note:   Arrival Method: Via stretcher from the ED with the RN  Mental Orientation: Alert and Oriented X4 Telemetry: Placed on box 6E15 Assessment: Completed Skin: Old healed wounds on sacrum, left foot closed diabetic order IV: Clean, dry and intact. NS @ 75 mL/hr Pain: Stated she is no pain at this time  Tubes: None.  Safety Measures: Safety Fall Prevention Plan has been given, discussed and signed by daughter Dedra SkeensGwen Admission: Completed [address] [place] Orientation: Patient has been orientated to the room, unit and staff.  Family: Daughter [name] at bedside. Will not be staying the night  Orders have been reviewed and implemented. Will continue to monitor the patient. Call light has been placed within reach and bed alarm has been activated.   [employer] Varcoyanne Hill BSN, RN  Phone number: [phone]

## 2013-07-25 NOTE — Progress Notes (Signed)
UR completed. Patient changed to inpatient- requiring IV antibiotics.  

## 2013-07-25 NOTE — H&P (Signed)
[facility] History and Physical  Patient: [name]  ZOX:[id number]  DOB: 1928-07-17  DOS: the patient was seen and examined on 07/25/2013 PCP: Carrie MewJENKINS,JOHN EDWARD, MD  Chief Complaint: Nausea vomiting and generalized weakness  HPI: Michelle CorinJosie W Minner is a 80 y.o. female with Past medical history of diabetes mellitus, GERD, hypertension, osteoarthritis, ESRD on hemodialysis. The patient is coming from home. The patient presented with complaints of nausea and vomiting that has been ongoing since last Monday. Along with that she also has some diarrhea. She has generalized weakness and has not been able to significantly move around. Her last dialysis treatment was on Saturday. She saw his PCP on Friday and started her on Macrobid and since then she has been developing diarrhea without any blood. She also feels nauseous but no vomiting or abdominal pain. She has chronic burning urination with blood occasionally that she saw last on Thursday. She had nausea and multiple episodes 4 episodes of vomiting on a daily basis without any blood. It got worse with Macrobid.  Review of Systems: as mentioned in the history of present illness.  A Comprehensive review of the other systems is negative.  Past Medical History  Diagnosis Date  . Cough   . Diabetes mellitus     type 2  . GERD (gastroesophageal reflux disease)   . Hypertension   . Bronchitis   . Osteoarthritis   . End stage renal disease   . Hemodialysis patient     esrd   Past Surgical History  Procedure Laterality Date  . Eye surgery      Cataract extraction   Social History:  reports that she has never smoked. She does not have any smokeless tobacco history on file. She reports that she does not drink alcohol or use illicit drugs. Independent for most of her  ADL.  No Known Allergies  Family History  Problem Relation Age of Onset  . Hypertension Mother     Prior to Admission medications   Medication Sig Start Date  End Date Taking? Authorizing Provider  Acetaminophen (TYLENOL EXTRA STRENGTH) 167 MG/5ML LIQD Take 5 mLs by mouth daily as needed. For pain/fever   Yes Historical Provider, MD  amLODipine (NORVASC) 5 MG tablet Take 2.5 mg by mouth daily. Take half tablet on Sundays mondays wednesdays and fridays 04/05/12  Yes Baker PieriniPadonda B Campbell, FNP  aspirin 81 MG tablet Take 81 mg by mouth daily.   Yes Historical Provider, MD  cinacalcet (SENSIPAR) 30 MG tablet Take 30 mg by mouth daily.   Yes Historical Provider, MD  Dorzolamide HCl-Timolol Mal (COSOPT OP) Place 1 drop into both eyes daily.    Yes Historical Provider, MD  escitalopram (LEXAPRO) 10 MG tablet TAKE ONE-HALF TABLET BY MOUTH EVERY DAY 12/28/12  Yes Stacie GlazeJohn E Jenkins, MD  folic acid-vitamin b complex-vitamin c-selenium-zinc (DIALYVITE) 3 MG TABS Take 1 tablet by mouth daily.     Yes Historical Provider, MD  levocetirizine (XYZAL) 5 MG tablet Take 5 mg by mouth daily. 1/2 once daily   Yes Historical Provider, MD  NEXIUM 40 MG capsule TAKE 1 CAPSULE EVERY DAY 10/17/12  Yes Stacie GlazeJohn E Jenkins, MD  nitrofurantoin, macrocrystal-monohydrate, (MACROBID) 100 MG capsule Take 1 capsule (100 mg total) by mouth 2 (two) times daily. 07/21/13  Yes Baker PieriniPadonda B Campbell, FNP  promethazine (PHENERGAN) 25 MG tablet Take 12.5 mg by mouth every 6 (six) hours as needed for nausea or vomiting.   Yes Historical Provider, MD  quiNINE (QUALAQUIN) 324 MG  capsule Take 648 mg by mouth 3 (three) times daily. Take on tuesdays thursdays and saturdays   Yes Historical Provider, MD  sevelamer (RENAGEL) 800 MG tablet Take 800 mg by mouth 4 (four) times daily.    Yes Historical Provider, MD  simvastatin (ZOCOR) 80 MG tablet TAKE ONE TABLET BY MOUTH EVERY DAY 01/09/13  Yes Stacie Glaze, MD    Physical Exam: Filed Vitals:   07/24/13 1829 07/24/13 2245 07/25/13 0148 07/25/13 0210  BP: 150/51 183/65 102/42 109/56  Pulse:   86 85  Temp:    99.6 F (37.6 C)  TempSrc:    Oral  Resp: 14 11 18 16    Height:    5\' 1"  (1.549 m)  Weight:    61.009 kg (134 lb 8 oz)  SpO2: 100%  100% 100%    General: Alert, Awake and Oriented to Time, Place and Person. Appear in mild distress Eyes: PERRL ENT: Oral Mucosa clear ry. Neck: No JVD Cardiovascular: S1 and S2 Present, aortic systolic Murmur, Peripheral Pulses Present Respiratory: Bilateral Air entry equal and Decreased, Clear to Auscultation,  No Crackles, nowheezes Abdomen: Bowel Sound Present, Soft and Non tender Skin: No Rash Extremities: No Pedal edema, no calf tenderness Neurologic: Grossly Unremarkable.  Labs on Admission:  CBC:  Recent Labs Lab 07/21/13 1138 07/24/13 1810  WBC 9.3 5.9  NEUTROABS 7.2 3.6  HGB 11.5* 11.8*  HCT 35.5* 36.5  MCV 93.3 92.2  PLT 96.0* 107*    CMP     Component Value Date/Time   NA 141 07/24/2013 1810   K 3.4* 07/24/2013 1810   CL 91* 07/24/2013 1810   CO2 26 07/24/2013 1810   GLUCOSE 181* 07/24/2013 1810   GLUCOSE 111 02/07/2009   GLUCOSE 246* 05/28/2006 1229   BUN 42* 07/24/2013 1810   CREATININE 7.35* 07/24/2013 1810   CALCIUM 9.5 07/24/2013 1810   CALCIUM 9.6 07/18/2012 1051   PROT 8.4* 07/24/2013 1810   ALBUMIN 3.4* 07/24/2013 1810   AST 14 07/24/2013 1810   ALT 10 07/24/2013 1810   ALKPHOS 122* 07/24/2013 1810   BILITOT 0.9 07/24/2013 1810   GFRNONAA 5* 07/24/2013 1810   GFRAA 5* 07/24/2013 1810     Recent Labs Lab 07/24/13 2241  LIPASE 114*   No results found for this basename: AMMONIA,  in the last 168 hours   Recent Labs Lab 07/24/13 2241  TROPONINI <0.30   BNP (last 3 results) No results found for this basename: PROBNP,  in the last 8760 hours  Radiological Exams on Admission: Dg Chest 2 View  07/24/2013   CLINICAL DATA:  Nausea, vomiting.  Renal patient.  EXAM: CHEST  2 VIEW  COMPARISON:  07/04/2010 and prior chest radiographs  FINDINGS: Mild cardiomegaly noted.  A right central venous catheter is again noted with tip overlying the upper right atrium.  There is no evidence  of focal airspace disease, pulmonary edema, suspicious pulmonary nodule/mass, pleural effusion, or pneumothorax. No acute bony abnormalities are identified.  IMPRESSION: No evidence of active cardiopulmonary disease.   Electronically Signed   By: Laveda Abbe M.D.   On: 07/24/2013 22:18   Ct Abdomen Pelvis W Contrast  07/24/2013   CLINICAL DATA:  80 year old female with abdominal and pelvic pain, vomiting and diarrhea. Patient with end-stage renal disease.  EXAM: CT ABDOMEN AND PELVIS WITH CONTRAST  TECHNIQUE: Multidetector CT imaging of the abdomen and pelvis was performed using the standard protocol following bolus administration of intravenous contrast.  CONTRAST:   OMNIPAQUE IOHEXOL 300 MG/ML  SOLN  COMPARISON:  11/25/2010 CTA  FINDINGS: The liver, spleen, pancreas, and adrenal glands are unremarkable.  Enhancement of the right renal pelvis walls and right ureteral walls are noted compatible with infection/UTI. No definite evidence of pyelonephritis or renal abscess noted.  Severe bilateral renal atrophy is identified.  Circumferential bladder wall thickening is again noted and may be related to cystitis.  Cholelithiasis identified without CT evidence of acute cholecystitis.  There is no evidence of free fluid, enlarged lymph nodes, biliary dilation or abdominal aortic aneurysm.  There is no evidence of bowel obstruction, pneumoperitoneum or abscess.  The patient is status post hysterectomy.  No acute or suspicious bony abnormalities are identified. Mild compression of the L3 superior endplate is unchanged.  IMPRESSION: Diffuse enhancement of the right renal pelvis and right ureteral walls compatible with infection/ UTI. No definite evidence of right pyelonephritis.  Circumferential bladder wall thickening which may represent cystitis.  Atrophic kidneys.  Cholelithiasis without CT evidence of acute cholecystitis.   Electronically Signed   By: Laveda Abbe M.D.   On: 07/24/2013 23:45      Assessment/Plan Principal Problem:   Vomiting Active Problems:   DIABETES MELLITUS, TYPE II   HYPERTENSION   GERD   End stage renal disease   UTI (lower urinary tract infection)   1. Vomiting The patient is presenting with intractable nausea and vomiting along with that she has complains of blood in the urine for which a CT scan was performed which isn't suggestive of a right renal pelvis and ureteral infection with cystitis without pyelonephritis or obstructive uropathy. Next with this I would admit her to the hospital, he ordered. Liquid diets, IV fluids, IV antibiotics-ceftriaxone She has mildly elevated lipase but no evidence of pancreatic involvement and Chlamydia as is continue to monitor at present IV hydration  2. Diabetes mellitus Patient was up in the past on insulin now only on metformin Placing her on sliding scale  3. Hypertension Resuming antihypertensives from tomorrow in the morning  4.ESRD   nephro consult for hemodialysis patient he for dialysis today  Consults: Nephrology   DVT Prophylaxis: subcutaneous Heparin Nutrition: Clear liquid diet   Code Status: Full   Family Communication: Family  was present at bedside, opportunity was given to ask question and all questions were answered satisfactorily at the time of interview. Disposition: Admitted to observation in med-surge unit.  Author: Lynden Oxford, MD [facility] Pager: [phone] 07/25/2013, 3:17 AM    If 7PM-7AM, please contact night-coverage [email or web address] Password [email or web address]

## 2013-07-26 DIAGNOSIS — D509 Iron deficiency anemia, unspecified: Secondary | ICD-10-CM

## 2013-07-26 DIAGNOSIS — N390 Urinary tract infection, site not specified: Secondary | ICD-10-CM | POA: Diagnosis not present

## 2013-07-26 DIAGNOSIS — E785 Hyperlipidemia, unspecified: Secondary | ICD-10-CM

## 2013-07-26 DIAGNOSIS — I10 Essential (primary) hypertension: Secondary | ICD-10-CM

## 2013-07-26 DIAGNOSIS — R197 Diarrhea, unspecified: Secondary | ICD-10-CM | POA: Diagnosis not present

## 2013-07-26 DIAGNOSIS — N186 End stage renal disease: Secondary | ICD-10-CM | POA: Diagnosis not present

## 2013-07-26 DIAGNOSIS — K219 Gastro-esophageal reflux disease without esophagitis: Secondary | ICD-10-CM | POA: Diagnosis not present

## 2013-07-26 DIAGNOSIS — R112 Nausea with vomiting, unspecified: Secondary | ICD-10-CM | POA: Diagnosis not present

## 2013-07-26 LAB — GLUCOSE, CAPILLARY
GLUCOSE-CAPILLARY: 122 mg/dL — AB (ref 70–99)
GLUCOSE-CAPILLARY: 98 mg/dL (ref 70–99)
Glucose-Capillary: 71 mg/dL (ref 70–99)
Glucose-Capillary: 88 mg/dL (ref 70–99)

## 2013-07-26 LAB — HEMOGLOBIN A1C
HEMOGLOBIN A1C: 6.7 % — AB (ref <5.7)
Mean Plasma Glucose: 146 mg/dL — ABNORMAL HIGH (ref <117)

## 2013-07-26 MED ORDER — NEPRO/CARBSTEADY PO LIQD
237.0000 mL | ORAL | Status: DC
  Administered 2013-07-27: 237 mL via ORAL

## 2013-07-26 MED ORDER — PANTOPRAZOLE SODIUM 40 MG PO TBEC
40.0000 mg | DELAYED_RELEASE_TABLET | Freq: Two times a day (BID) | ORAL | Status: DC
  Administered 2013-07-26 – 2013-07-28 (×4): 40 mg via ORAL
  Filled 2013-07-26 (×3): qty 1

## 2013-07-26 MED ORDER — INSULIN ASPART 100 UNIT/ML ~~LOC~~ SOLN
0.0000 [IU] | Freq: Three times a day (TID) | SUBCUTANEOUS | Status: DC
  Administered 2013-07-26: 1 [IU] via SUBCUTANEOUS

## 2013-07-26 NOTE — Clinical Documentation Improvement (Signed)
Possible Clinical Conditions?  Severe Malnutrition   Protein Calorie Malnutrition Severe Protein Calorie Malnutrition Other Condition Cannot clinically determine  Supporting Information: Per 07/25/13 Nutritional Consult: Patient meets criteria for severe malnutrition in the context of acute illness or injury.  as evidenced by 5% wt loss x 2 weeks and intake of <50% x at least 5 days.   Potassium is currently low at 3.4.  Height:  Ht Readings from Last 1 Encounters:   07/25/13  5\' 1"  (1.549 m)        Weight:  Wt Readings from Last 1 Encounters:   07/25/13  134 lb 8 oz (61.009 kg)        Ideal Body Weight: 105 lb   % Ideal Body Weight: 128%   Wt Readings from Last 10 Encounters:   07/25/13  134 lb 8 oz (61.009 kg)   07/21/13  144 lb (65.318 kg)   05/12/13  144 lb (65.318 kg)   01/09/13  147 lb (66.679 kg)   07/18/12  147 lb (66.679 kg)   01/06/12  139 lb (63.05 kg)   09/02/11  147 lb (66.679 kg)   05/01/11  147 lb (66.679 kg)   12/29/10  150 lb (68.04 kg)   09/29/10  152 lb (68.947 kg)        Usual Body Weight: 64 kg   % Usual Body Weight: 95%   BMI: Body mass index is 25.43 kg/(m^2). Overweight      Thank You, Shelda Palarlene H Cooper ,RN Clinical Documentation Specialist:  [phone]  [facility]- Health Information Management

## 2013-07-26 NOTE — Progress Notes (Signed)
Subjective:   Feeling okay, diarrhea twice today. Nausea comes and goes.   Objective Filed Vitals:   07/25/13 2047 07/26/13 0605 07/26/13 0759 07/26/13 1205  BP: 109/53 138/66 121/64 133/64  Pulse: 85 84 76 83  Temp: 99.7 F (37.6 C) 98.8 F (37.1 C) 98.5 F (36.9 C) 98.7 F (37.1 C)  TempSrc: Oral Oral Oral Oral  Resp: 16 15 16 16   Height: 5\' 1"  (1.549 m)     Weight: 61.5 kg (135 lb 9.3 oz)     SpO2: 97% 99% 97% 97%   Physical Exam General: alert and oriented. No acute distress. Heart: RRR no murmur Lungs: CTA, unlabored. Abdomen: soft nontender +BS Extremities:no LE edema Dialysis Access: R Hero +bruit/thrill  Dialysis Orders: TTS @ [facility]  3:45 64kgs 2K/2.25 160 400/1.5HD 3500 Heparin RHero  hectorol 4 mcg IV/HD  Profile 2   Assessment/Plan: N/V/D- work up per primary. Cdiff negative. WBC 5.9 afebrile. zofran for nausea. Likely gastroenteritis UTI- ABD CT- UTI, possible cystitis. no pyelonephritis. IV ceftriaxone  ESRD - TTS @ [facility]. HD pending tomorrow. under EDW, need to lower at DC  Hypertension/volume - 133/64, norvasc. No volume excess.  Anemia - hgb 11.8, no outpt epo or iron. Watch CBC. Last tsat 31  Metabolic bone disease - Ca+8.7 Sensipar, Last PTH 1048, P 2.3 hold renvela/poor appetite Nutrition - Alb 3.4 poor appetite. Full liquid diet Multivit. Renal diet when advanced  DM- per primary   Jetty Duhamel, NP [facility] Beeper [phone] 07/26/2013,2:15 PM  LOS: 2 days   I have seen and examined patient, discussed with PA and agree with assessment and plan as outlined above. Vinson Moselle MD pager [phone]    cell [phone] 07/26/2013, 4:14 PM    Additional Objective Labs: Basic Metabolic Panel:  Recent Labs Lab 07/24/13 1810 07/25/13 1525 07/25/13 1600  NA 141 139 139  K 3.4* 3.6* 3.6*  CL 91* 94* 94*  CO2 26 21 20   GLUCOSE 181* 154* 149*  BUN 42* 46* 46*  CREATININE 7.35* 8.78* 8.84*  CALCIUM 9.5 8.8 8.7  PHOS  --  2.3   --    Liver Function Tests:  Recent Labs Lab 07/24/13 1810 07/25/13 1525 07/25/13 1600  AST 14  --  13  ALT 10  --  7  ALKPHOS 122*  --  113  BILITOT 0.9  --  0.5  PROT 8.4*  --  7.2  ALBUMIN 3.4* 2.9* 2.9*    Recent Labs Lab 07/24/13 2241  LIPASE 114*   CBC:  Recent Labs Lab 07/21/13 1138 07/24/13 1810  WBC 9.3 5.9  NEUTROABS 7.2 3.6  HGB 11.5* 11.8*  HCT 35.5* 36.5  MCV 93.3 92.2  PLT 96.0* 107*   Blood Culture    Component Value Date/Time   SDES STOOL 11/25/2010 0526   SPECREQUEST NONE 11/25/2010 0526   CULT  Value: NO SALMONELLA, SHIGELLA, CAMPYLOBACTER, OR YERSINIA ISOLATED Note: REDUCED NORMAL FLORA PRESENT 11/25/2010 0526   REPTSTATUS 11/28/2010 FINAL 11/25/2010 0526    Cardiac Enzymes:  Recent Labs Lab 07/24/13 2241  TROPONINI <0.30   CBG:  Recent Labs Lab 07/25/13 0804 07/25/13 1123 07/25/13 2253 07/26/13 0756 07/26/13 1202  GLUCAP 101* 104* 119* 71 88   Iron Studies: No results found for this basename: IRON, TIBC, TRANSFERRIN, FERRITIN,  in the last 72 hours @lablastinr3 @ Studies/Results: Dg Chest 2 View  07/24/2013   CLINICAL DATA:  Nausea, vomiting.  Renal patient.  EXAM: CHEST  2 VIEW  COMPARISON:  07/04/2010 and prior chest radiographs  FINDINGS: Mild cardiomegaly noted.  A right central venous catheter is again noted with tip overlying the upper right atrium.  There is no evidence of focal airspace disease, pulmonary edema, suspicious pulmonary nodule/mass, pleural effusion, or pneumothorax. No acute bony abnormalities are identified.  IMPRESSION: No evidence of active cardiopulmonary disease.   Electronically Signed   By: Laveda AbbeJeff  Hu M.D.   On: 07/24/2013 22:18   Ct Abdomen Pelvis W Contrast  07/24/2013   CLINICAL DATA:  80 year old female with abdominal and pelvic pain, vomiting and diarrhea. Patient with end-stage renal disease.  EXAM: CT ABDOMEN AND PELVIS WITH CONTRAST  TECHNIQUE: Multidetector CT imaging of the abdomen and pelvis was  performed using the standard protocol following bolus administration of intravenous contrast.  CONTRAST:  100mL OMNIPAQUE IOHEXOL 300 MG/ML  SOLN  COMPARISON:  11/25/2010 CTA  FINDINGS: The liver, spleen, pancreas, and adrenal glands are unremarkable.  Enhancement of the right renal pelvis walls and right ureteral walls are noted compatible with infection/UTI. No definite evidence of pyelonephritis or renal abscess noted.  Severe bilateral renal atrophy is identified.  Circumferential bladder wall thickening is again noted and may be related to cystitis.  Cholelithiasis identified without CT evidence of acute cholecystitis.  There is no evidence of free fluid, enlarged lymph nodes, biliary dilation or abdominal aortic aneurysm.  There is no evidence of bowel obstruction, pneumoperitoneum or abscess.  The patient is status post hysterectomy.  No acute or suspicious bony abnormalities are identified. Mild compression of the L3 superior endplate is unchanged.  IMPRESSION: Diffuse enhancement of the right renal pelvis and right ureteral walls compatible with infection/ UTI. No definite evidence of right pyelonephritis.  Circumferential bladder wall thickening which may represent cystitis.  Atrophic kidneys.  Cholelithiasis without CT evidence of acute cholecystitis.   Electronically Signed   By: Laveda AbbeJeff  Hu M.D.   On: 07/24/2013 23:45   Medications:   . amLODipine  2.5 mg Oral Once per day on Sun Mon Wed Fri  . aspirin EC  81 mg Oral Daily  . atorvastatin  40 mg Oral q1800  . cefTRIAXone (ROCEPHIN)  IV  1 g Intravenous QHS  . cinacalcet  30 mg Oral Q breakfast  . escitalopram  5 mg Oral Daily  . feeding supplement (NEPRO CARB STEADY)  237 mL Oral Q24H  . heparin  5,000 Units Subcutaneous 3 times per day  . insulin aspart  0-9 Units Subcutaneous TID WC  . multivitamin  1 tablet Oral QHS  . pantoprazole  40 mg Oral BID AC  . quiNINE  648 mg Oral 3 times per day on Tue Thu Sat  . sevelamer carbonate  1,600 mg  Oral TID WC  . sodium chloride  3 mL Intravenous Q12H

## 2013-07-26 NOTE — Progress Notes (Signed)
NUTRITION FOLLOW UP  DOCUMENTATION CODES Per approved criteria  -Severe malnutrition in the context of acute illness or injury   INTERVENTION: Add Nepro Shake po daily, each supplement provides 425 kcal and 19 grams protein, once diet advances. Monitor magnesium, potassium, and phosphorus daily for at least 3 days, MD to replete as needed, as pt is at risk for refeeding syndrome given severe malnutrition. RD to continue to follow nutrition care plan.  NUTRITION DIAGNOSIS: Inadequate oral intake related to GI distress as evidenced by poor po intake.   Goal: Further diet advancement. Intake to meet >90% of estimated nutrition needs.  Monitor:  weight trends, lab trends, I/O's, PO intake, supplement tolerance, further diet advancement  ASSESSMENT: PMHx significant for DM, GERD, HTN, osteoarthritis, ESRD (on HD.) Admitted with n/v/d also bloody urine. Work-up ongoing.  Pt now on Full Liquids - intake remains minimal.  Pt states that she likes Ensure and drinks it at home. Is agreeable to Nepro.  Pt meets criteria for severe MALNUTRITION in the context of acute illness as evidenced by 5% wt loss x 2 weeks and intake of <50% x at least 5 days.  Potassium is currently low at 3.6.  Height: Ht Readings from Last 1 Encounters:  07/25/13 5\' 1"  (1.549 m)    Weight: Wt Readings from Last 1 Encounters:  07/25/13 135 lb 9.3 oz (61.5 kg)  Admit wt 134 lb  BMI:  Body mass index is 25.63 kg/(m^2). Overweight  Estimated Nutritional Needs: Kcal: 1600 - 1800 Protein: at least 73 g Fluid: 1.2 liters  Skin:  L heel ulcer  Diet Order: Full Liquid  EDUCATION NEEDS: -No education needs identified at this time   Intake/Output Summary (Last 24 hours) at 07/26/13 1159 Last data filed at 07/25/13 1939  Gross per 24 hour  Intake      0 ml  Output    722 ml  Net   -722 ml    Last BM: 2/24  Labs:   Recent Labs Lab 07/24/13 1810 07/25/13 1525 07/25/13 1600  NA 141 139 139   K 3.4* 3.6* 3.6*  CL 91* 94* 94*  CO2 26 21 20   BUN 42* 46* 46*  CREATININE 7.35* 8.78* 8.84*  CALCIUM 9.5 8.8 8.7  PHOS  --  2.3  --   GLUCOSE 181* 154* 149*    CBG (last 3)   Recent Labs  07/25/13 1123 07/25/13 2253 07/26/13 0756  GLUCAP 104* 119* 71    Scheduled Meds: . amLODipine  2.5 mg Oral Once per day on Sun Mon Wed Fri  . aspirin EC  81 mg Oral Daily  . atorvastatin  40 mg Oral q1800  . cefTRIAXone (ROCEPHIN)  IV  1 g Intravenous QHS  . cinacalcet  30 mg Oral Q breakfast  . escitalopram  5 mg Oral Daily  . heparin  5,000 Units Subcutaneous 3 times per day  . multivitamin  1 tablet Oral QHS  . pantoprazole  40 mg Oral BID AC  . quiNINE  648 mg Oral 3 times per day on Tue Thu Sat  . sevelamer carbonate  1,600 mg Oral TID WC  . sodium chloride  3 mL Intravenous Q12H    Continuous Infusions:    Jarold MottoSamantha Demarcus Thielke MS, RD, LDN Inpatient Registered Dietitian Pager: [phone] After-hours pager: [phone]

## 2013-07-26 NOTE — Progress Notes (Signed)
[facility]                                                                              Patient Demographics  [name], is a 80 y.o. female, DOB - September 27, 1928, [id number]  Admit date - 07/24/2013   Admitting Physician Michelle Oxford, MD  Outpatient Primary MD for the patient is Michelle Mew, MD  LOS - 2   Chief Complaint  Patient presents with  . Nausea  . Diarrhea        Assessment & Plan   Nausea/vomiting -Nausea and vomiting improved, patient tolerating clear liquid diet -Will advance diet as tolerated -Likely secondary to gastroenteritis -Continue Protonix  Diarrhea -C. difficile PCR negative -Likely secondary to gastroenteritis  UTI -Pending urine culture -Continue IV ceftriaxone  Diabetes mellitus -Continue CBG monitoring and insulin sliding scale  Hypertension -Controlled, continue amlodipine  End-stage renal disease requiring hemodialysis -Nephrology consulted and following -Continue Sensipar, renavit, renvela  Depression/anxiety Continue Lexapro  Hyperlipidemia Continue statin  Code Status: Full  Family Communication: None at bedside.  Disposition Plan: Admitted.  Will likely discharge within 1-2 days, will advance diet as tolerated.  Time Spent in minutes   30 minutes  Procedures  None  Consults   Nephrology  DVT Prophylaxis  heparin  Lab Results  Component Value Date   PLT 107* 07/24/2013    Medications  Scheduled Meds: . amLODipine  2.5 mg Oral Once per day on Sun Mon Wed Fri  . aspirin EC  81 mg Oral Daily  . atorvastatin  40 mg Oral q1800  . cefTRIAXone (ROCEPHIN)  IV  1 g Intravenous QHS  . cinacalcet  30 mg Oral Q breakfast  . escitalopram  5 mg Oral Daily  . heparin  5,000 Units Subcutaneous 3 times per day  . multivitamin  1 tablet Oral QHS  . pantoprazole (PROTONIX) IV  40 mg Intravenous Q12H  . quiNINE  648 mg Oral 3 times per day on Tue Thu Sat  . sevelamer carbonate  1,600 mg Oral TID WC  .  sodium chloride  3 mL Intravenous Q12H   Continuous Infusions:  PRN Meds:.ondansetron (ZOFRAN) IV, ondansetron  Antibiotics    Anti-infectives   Start     Dose/Rate Route Frequency Ordered Stop   07/25/13 2200  cefTRIAXone (ROCEPHIN) 1 g in dextrose 5 % 50 mL IVPB     1 g 100 mL/hr over 30 Minutes Intravenous Daily at bedtime 07/25/13 0215     07/25/13 1000  quiNINE (QUALAQUIN) capsule 648 mg     648 mg Oral 3 times per day on Tue Thu Sat 07/25/13 0246     07/25/13 0000  cefTRIAXone (ROCEPHIN) 1 g in dextrose 5 % 50 mL IVPB     1 g 100 mL/hr over 30 Minutes Intravenous  Once 07/24/13 2353 07/25/13 0041        Subjective:   [name] seen and examined today.  Patient no longer complains of vomiting.  She is able to tolerate her diet.  Patient does complain of diarrhea.  She states "I don't know when I go."  Denies chest pains, dizziness, SOB.    Objective:   Filed Vitals:  07/25/13 1939 07/25/13 2047 07/26/13 0605 07/26/13 0759  BP: 101/37 109/53 138/66 121/64  Pulse: 88 85 84 76  Temp: 98.7 F (37.1 C) 99.7 F (37.6 C) 98.8 F (37.1 C) 98.5 F (36.9 C)  TempSrc: Oral Oral Oral Oral  Resp: 16 16 15 16   Height:  5\' 1"  (1.549 m)    Weight: 61.5 kg (135 lb 9.3 oz) 61.5 kg (135 lb 9.3 oz)    SpO2: 95% 97% 99% 97%    Wt Readings from Last 3 Encounters:  07/25/13 61.5 kg (135 lb 9.3 oz)  07/21/13 65.318 kg (144 lb)  05/12/13 65.318 kg (144 lb)     Intake/Output Summary (Last 24 hours) at 07/26/13 0841 Last data filed at 07/25/13 1939  Gross per 24 hour  Intake    120 ml  Output    722 ml  Net   -602 ml    Exam  General: Well developed, well nourished, NAD, appears stated age  HEENT: NCAT, PERRLA, EOMI, Anicteic Sclera, mucous membranes moist.   Neck: Supple, no JVD, no masses  Cardiovascular: S1 S2 auscultated, no rubs, murmurs or gallops. Regular rate and rhythm.  Respiratory: Clear to auscultation bilaterally with equal chest rise  Abdomen: Soft,  nontender, nondistended, + bowel sounds  Extremities: warm dry without cyanosis clubbing or edema, moves all 4 ext spontaneously  Neuro: AAOx3, cranial nerves grossly intact.   Skin: Without rashes exudates or nodules  Psych: Normal affect and demeanor with intact judgement and insight   Data Review   Micro Results Recent Results (from the past 240 hour(s))  CLOSTRIDIUM DIFFICILE BY PCR     Status: None   Collection Time    07/25/13  2:25 AM      Result Value Ref Range Status   C difficile by pcr NEGATIVE  NEGATIVE Final  MRSA PCR SCREENING     Status: None   Collection Time    07/25/13  6:40 AM      Result Value Ref Range Status   MRSA by PCR NEGATIVE  NEGATIVE Final   Comment:            The GeneXpert MRSA Assay (FDA     approved for NASAL specimens     only), is one component of a     comprehensive MRSA colonization     surveillance program. It is not     intended to diagnose MRSA     infection nor to guide or     monitor treatment for     MRSA infections.    Radiology Reports Dg Chest 2 View  07/24/2013   CLINICAL DATA:  Nausea, vomiting.  Renal patient.  EXAM: CHEST  2 VIEW  COMPARISON:  07/04/2010 and prior chest radiographs  FINDINGS: Mild cardiomegaly noted.  A right central venous catheter is again noted with tip overlying the upper right atrium.  There is no evidence of focal airspace disease, pulmonary edema, suspicious pulmonary nodule/mass, pleural effusion, or pneumothorax. No acute bony abnormalities are identified.  IMPRESSION: No evidence of active cardiopulmonary disease.   Electronically Signed   By: Laveda AbbeJeff  Hu M.D.   On: 07/24/2013 22:18   Ct Abdomen Pelvis W Contrast  07/24/2013   CLINICAL DATA:  80 year old female with abdominal and pelvic pain, vomiting and diarrhea. Patient with end-stage renal disease.  EXAM: CT ABDOMEN AND PELVIS WITH CONTRAST  TECHNIQUE: Multidetector CT imaging of the abdomen and pelvis was performed using the standard protocol  following bolus administration of  intravenous contrast.  CONTRAST:  OMNIPAQUE IOHEXOL 300 MG/ML  SOLN  COMPARISON:  11/25/2010 CTA  FINDINGS: The liver, spleen, pancreas, and adrenal glands are unremarkable.  Enhancement of the right renal pelvis walls and right ureteral walls are noted compatible with infection/UTI. No definite evidence of pyelonephritis or renal abscess noted.  Severe bilateral renal atrophy is identified.  Circumferential bladder wall thickening is again noted and may be related to cystitis.  Cholelithiasis identified without CT evidence of acute cholecystitis.  There is no evidence of free fluid, enlarged lymph nodes, biliary dilation or abdominal aortic aneurysm.  There is no evidence of bowel obstruction, pneumoperitoneum or abscess.  The patient is status post hysterectomy.  No acute or suspicious bony abnormalities are identified. Mild compression of the L3 superior endplate is unchanged.  IMPRESSION: Diffuse enhancement of the right renal pelvis and right ureteral walls compatible with infection/ UTI. No definite evidence of right pyelonephritis.  Circumferential bladder wall thickening which may represent cystitis.  Atrophic kidneys.  Cholelithiasis without CT evidence of acute cholecystitis.   Electronically Signed   By: Laveda Abbe M.D.   On: 07/24/2013 23:45    CBC  Recent Labs Lab 07/21/13 1138 07/24/13 1810  WBC 9.3 5.9  HGB 11.5* 11.8*  HCT 35.5* 36.5  PLT 96.0* 107*  MCV 93.3 92.2  MCH  --  29.8  MCHC 32.4 32.3  RDW 19.0* 17.1*  LYMPHSABS 1.3 1.5  MONOABS 0.7 0.7  EOSABS 0.0 0.1  BASOSABS 0.0 0.0    Chemistries   Recent Labs Lab 07/24/13 1810 07/25/13 1525 07/25/13 1600  NA 141 139 139  K 3.4* 3.6* 3.6*  CL 91* 94* 94*  CO2 26 21 20   GLUCOSE 181* 154* 149*  BUN 42* 46* 46*  CREATININE 7.35* 8.78* 8.84*  CALCIUM 9.5 8.8 8.7  AST 14  --  13  ALT 10  --  7  ALKPHOS 122*  --  113  BILITOT 0.9  --  0.5    ------------------------------------------------------------------------------------------------------------------ estimated creatinine clearance is 4.1 ml/min (by C-G formula based on Cr of 8.84). ------------------------------------------------------------------------------------------------------------------ No results found for this basename: HGBA1C,  in the last 72 hours ------------------------------------------------------------------------------------------------------------------ No results found for this basename: CHOL, HDL, LDLCALC, TRIG, CHOLHDL, LDLDIRECT,  in the last 72 hours ------------------------------------------------------------------------------------------------------------------ No results found for this basename: TSH, T4TOTAL, FREET3, T3FREE, THYROIDAB,  in the last 72 hours ------------------------------------------------------------------------------------------------------------------ No results found for this basename: VITAMINB12, FOLATE, FERRITIN, TIBC, IRON, RETICCTPCT,  in the last 72 hours  Coagulation profile No results found for this basename: INR, PROTIME,  in the last 168 hours  No results found for this basename: DDIMER,  in the last 72 hours  Cardiac Enzymes  Recent Labs Lab 07/24/13 2241  TROPONINI <0.30   ------------------------------------------------------------------------------------------------------------------ No components found with this basename: POCBNP,     Nely Dedmon D.O. on 07/26/2013 at 8:41 AM  Between 7am to 7pm - Pager - [phone]  After 7pm go to [email or web address] - password [email or web address]  And look for the night coverage person covering for me after hours  [facility] Office  [phone]

## 2013-07-27 DIAGNOSIS — K219 Gastro-esophageal reflux disease without esophagitis: Secondary | ICD-10-CM | POA: Diagnosis not present

## 2013-07-27 DIAGNOSIS — E119 Type 2 diabetes mellitus without complications: Secondary | ICD-10-CM | POA: Diagnosis not present

## 2013-07-27 DIAGNOSIS — R112 Nausea with vomiting, unspecified: Secondary | ICD-10-CM | POA: Diagnosis not present

## 2013-07-27 DIAGNOSIS — R1110 Vomiting, unspecified: Secondary | ICD-10-CM

## 2013-07-27 DIAGNOSIS — R197 Diarrhea, unspecified: Secondary | ICD-10-CM | POA: Diagnosis not present

## 2013-07-27 DIAGNOSIS — N390 Urinary tract infection, site not specified: Secondary | ICD-10-CM | POA: Diagnosis not present

## 2013-07-27 DIAGNOSIS — N186 End stage renal disease: Secondary | ICD-10-CM | POA: Diagnosis not present

## 2013-07-27 LAB — RENAL FUNCTION PANEL
ALBUMIN: 2.9 g/dL — AB (ref 3.5–5.2)
BUN: 20 mg/dL (ref 6–23)
CALCIUM: 8.9 mg/dL (ref 8.4–10.5)
CO2: 26 meq/L (ref 19–32)
Chloride: 100 mEq/L (ref 96–112)
Creatinine, Ser: 6.25 mg/dL — ABNORMAL HIGH (ref 0.50–1.10)
GFR calc Af Amer: 6 mL/min — ABNORMAL LOW (ref >90)
GFR, EST NON AFRICAN AMERICAN: 6 mL/min — AB (ref >90)
Glucose, Bld: 131 mg/dL — ABNORMAL HIGH (ref 70–99)
Phosphorus: 3.5 mg/dL (ref 2.3–4.6)
Potassium: 4.4 mEq/L (ref 3.7–5.3)
Sodium: 141 mEq/L (ref 137–147)

## 2013-07-27 LAB — GLUCOSE, CAPILLARY
Glucose-Capillary: 47 mg/dL — ABNORMAL LOW (ref 70–99)
Glucose-Capillary: 68 mg/dL — ABNORMAL LOW (ref 70–99)
Glucose-Capillary: 73 mg/dL (ref 70–99)
Glucose-Capillary: 74 mg/dL (ref 70–99)
Glucose-Capillary: 89 mg/dL (ref 70–99)
Glucose-Capillary: 90 mg/dL (ref 70–99)

## 2013-07-27 LAB — CBC
HCT: 29.9 % — ABNORMAL LOW (ref 36.0–46.0)
Hemoglobin: 9.9 g/dL — ABNORMAL LOW (ref 12.0–15.0)
MCH: 30.2 pg (ref 26.0–34.0)
MCHC: 33.1 g/dL (ref 30.0–36.0)
MCV: 91.2 fL (ref 78.0–100.0)
PLATELETS: 117 10*3/uL — AB (ref 150–400)
RBC: 3.28 MIL/uL — ABNORMAL LOW (ref 3.87–5.11)
RDW: 17.4 % — AB (ref 11.5–15.5)
WBC: 6.6 10*3/uL (ref 4.0–10.5)

## 2013-07-27 MED ORDER — DARBEPOETIN ALFA-POLYSORBATE 25 MCG/0.42ML IJ SOLN: 25.0000 ug | INTRAMUSCULAR | Status: DC

## 2013-07-27 MED ORDER — LIDOCAINE HCL (PF) 1 % IJ SOLN: 5.0000 mL | INTRAMUSCULAR | Status: DC | PRN

## 2013-07-27 MED ORDER — SODIUM CHLORIDE 0.9 % IV SOLN: 100.0000 mL | INTRAVENOUS | Status: DC | PRN

## 2013-07-27 MED ORDER — PENTAFLUOROPROP-TETRAFLUOROETH EX AERO: 1.0000 "application " | INHALATION_SPRAY | CUTANEOUS | Status: DC | PRN

## 2013-07-27 MED ORDER — LIDOCAINE-PRILOCAINE 2.5-2.5 % EX CREA: 1.0000 "application " | TOPICAL_CREAM | CUTANEOUS | Status: DC | PRN

## 2013-07-27 MED ORDER — NEPRO/CARBSTEADY PO LIQD: 237.0000 mL | ORAL | Status: DC | PRN

## 2013-07-27 MED ORDER — HEPARIN SODIUM (PORCINE) 1000 UNIT/ML DIALYSIS: 1000.0000 [IU] | INTRAMUSCULAR | Status: DC | PRN

## 2013-07-27 MED ORDER — SODIUM CHLORIDE 0.9 % IV SOLN
INTRAVENOUS | Status: AC
  Administered 2013-07-27: 13:00:00 via INTRAVENOUS

## 2013-07-27 MED ORDER — ALTEPLASE 2 MG IJ SOLR: 2.0000 mg | Freq: Once | INTRAMUSCULAR | Status: DC | PRN

## 2013-07-27 MED ORDER — HEPARIN SODIUM (PORCINE) 1000 UNIT/ML DIALYSIS: 3500.0000 [IU] | Freq: Once | INTRAMUSCULAR | Status: DC

## 2013-07-27 MED ORDER — LOPERAMIDE HCL 2 MG PO CAPS
2.0000 mg | ORAL_CAPSULE | ORAL | Status: DC | PRN
  Filled 2013-07-27: qty 1

## 2013-07-27 NOTE — Progress Notes (Signed)
[facility]                                                                              Patient Demographics  [name], is a 80 y.o. female, DOB - 10-28-1928, [id number]  Admit date - 07/24/2013   Admitting Physician Lynden OxfordPranav Patel, [name]  Outpatient Primary [name] for the patient is [name]  LOS - 3   Chief Complaint  Patient presents with  . Nausea  . Diarrhea        Assessment & Plan   Nausea/vomiting -Patient still complains of nausea with eating or drinking. -Continue her on full liquid diet, and advance as tolerated -Likely secondary to gastroenteritis -Continue antiemetics  GERD -Continue PPI  Diarrhea -C. difficile PCR negative -Likely secondary to gastroenteritis -Still having 2-3 episodes per day -Will add on immodium  UTI -Pending urine culture -Continue IV ceftriaxone  Diabetes mellitus -Continue CBG monitoring and insulin sliding scale  Hypertension -Controlled, continue amlodipine  End-stage renal disease requiring hemodialysis -Nephrology consulted and following -Continue Sensipar, renavit, renvela  Depression/anxiety Continue Lexapro  Hyperlipidemia Continue statin  Code Status: Full  Family Communication: None at bedside.  Disposition Plan: Admitted.  Will likely discharge within 1-2 days, will advance diet as tolerated.  Time Spent in minutes   25 minutes  Procedures  None  Consults   Nephrology  DVT Prophylaxis  heparin  Lab Results  Component Value Date   PLT 117* 07/27/2013    Medications  Scheduled Meds: . aspirin EC  81 mg Oral Daily  . atorvastatin  40 mg Oral q1800  . cefTRIAXone (ROCEPHIN)  IV  1 g Intravenous QHS  . cinacalcet  30 mg Oral Q breakfast  . darbepoetin  25 mcg Intravenous Q7 days  . escitalopram  5 mg Oral Daily  . feeding supplement (NEPRO CARB STEADY)  237 mL Oral Q24H  . heparin  3,500 Units Dialysis Once in dialysis  . heparin  5,000 Units Subcutaneous 3 times  per day  . insulin aspart  0-9 Units Subcutaneous TID WC  . multivitamin  1 tablet Oral QHS  . pantoprazole  40 mg Oral BID AC  . quiNINE  648 mg Oral 3 times per day on Tue Thu Sat  . sodium chloride  3 mL Intravenous Q12H   Continuous Infusions: . sodium chloride     PRN Meds:.sodium chloride, sodium chloride, alteplase, feeding supplement (NEPRO CARB STEADY), heparin, lidocaine (PF), lidocaine-prilocaine, ondansetron (ZOFRAN) IV, ondansetron, pentafluoroprop-tetrafluoroeth  Antibiotics    Anti-infectives   Start     Dose/Rate Route Frequency Ordered Stop   07/25/13 2200  cefTRIAXone (ROCEPHIN) 1 g in dextrose 5 % 50 mL IVPB     1 g 100 mL/hr over 30 Minutes Intravenous Daily at bedtime 07/25/13 0215     07/25/13 1000  quiNINE (QUALAQUIN) capsule 648 mg     648 mg Oral 3 times per day on Tue Thu Sat 07/25/13 0246     07/25/13 0000  cefTRIAXone (ROCEPHIN) 1 g in dextrose 5 % 50 mL IVPB     1 g 100 mL/hr over 30 Minutes Intravenous  Once 07/24/13 2353 07/25/13 0041  Subjective:   [name] seen and examined today in dialysis.  Patient continues to have 2-3 episodes of diarrhea per day. She also has some nausea associated with her liquid diet. Patient denies any current vomiting. Denies any chest pain shortness of breath or abdominal pain time.  Objective:   Filed Vitals:   07/27/13 1030 07/27/13 1100 07/27/13 1130 07/27/13 1200  BP: 89/45 81/42 101/43 101/50  Pulse: 79 79 79 76  Temp:      TempSrc:      Resp:      Height:      Weight:      SpO2:        Wt Readings from Last 3 Encounters:  07/27/13 62.1 kg (136 lb 14.5 oz)  07/21/13 65.318 kg (144 lb)  05/12/13 65.318 kg (144 lb)    No intake or output data in the 24 hours ending 07/27/13 1216  Exam  General: Well developed, well nourished, NAD, appears stated age  HEENT: NCAT,  mucous membranes moist.   Neck: Supple, no JVD, no masses  Cardiovascular: S1 S2 auscultated, RRegular rate and  rhythm.  Respiratory: Clear to auscultation bilaterally with equal chest rise  Abdomen: Soft, nontender, nondistended, + bowel sounds  Extremities: warm dry without cyanosis clubbing or edema  Neuro: AAOx3, no focal deficits.  Skin: Without rashes exudates or nodules  Psych: Normal affect and demeanor  Data Review   Micro Results Recent Results (from the past 240 hour(s))  CLOSTRIDIUM DIFFICILE BY PCR     Status: None   Collection Time    07/25/13  2:25 AM      Result Value Ref Range Status   C difficile by pcr NEGATIVE  NEGATIVE Final  MRSA PCR SCREENING     Status: None   Collection Time    07/25/13  6:40 AM      Result Value Ref Range Status   MRSA by PCR NEGATIVE  NEGATIVE Final   Comment:            The GeneXpert MRSA Assay (FDA     approved for NASAL specimens     only), is one component of a     comprehensive MRSA colonization     surveillance program. It is not     intended to diagnose MRSA     infection nor to guide or     monitor treatment for     MRSA infections.    Radiology Reports Dg Chest 2 View  07/24/2013   CLINICAL DATA:  Nausea, vomiting.  Renal patient.  EXAM: CHEST  2 VIEW  COMPARISON:  07/04/2010 and prior chest radiographs  FINDINGS: Mild cardiomegaly noted.  A right central venous catheter is again noted with tip overlying the upper right atrium.  There is no evidence of focal airspace disease, pulmonary edema, suspicious pulmonary nodule/mass, pleural effusion, or pneumothorax. No acute bony abnormalities are identified.  IMPRESSION: No evidence of active cardiopulmonary disease.   Electronically Signed   By: Laveda Abbe M.D.   On: 07/24/2013 22:18   Ct Abdomen Pelvis W Contrast  07/24/2013   CLINICAL DATA:  80 year old female with abdominal and pelvic pain, vomiting and diarrhea. Patient with end-stage renal disease.  EXAM: CT ABDOMEN AND PELVIS WITH CONTRAST  TECHNIQUE: Multidetector CT imaging of the abdomen and pelvis was performed using the  standard protocol following bolus administration of intravenous contrast.  CONTRAST:  OMNIPAQUE IOHEXOL 300 MG/ML  SOLN  COMPARISON:  11/25/2010 CTA  FINDINGS: The  liver, spleen, pancreas, and adrenal glands are unremarkable.  Enhancement of the right renal pelvis walls and right ureteral walls are noted compatible with infection/UTI. No definite evidence of pyelonephritis or renal abscess noted.  Severe bilateral renal atrophy is identified.  Circumferential bladder wall thickening is again noted and may be related to cystitis.  Cholelithiasis identified without CT evidence of acute cholecystitis.  There is no evidence of free fluid, enlarged lymph nodes, biliary dilation or abdominal aortic aneurysm.  There is no evidence of bowel obstruction, pneumoperitoneum or abscess.  The patient is status post hysterectomy.  No acute or suspicious bony abnormalities are identified. Mild compression of the L3 superior endplate is unchanged.  IMPRESSION: Diffuse enhancement of the right renal pelvis and right ureteral walls compatible with infection/ UTI. No definite evidence of right pyelonephritis.  Circumferential bladder wall thickening which may represent cystitis.  Atrophic kidneys.  Cholelithiasis without CT evidence of acute cholecystitis.   Electronically Signed   By: Laveda Abbe M.D.   On: 07/24/2013 23:45    CBC  Recent Labs Lab 07/21/13 1138 07/24/13 1810 07/25/13 1614 07/27/13 0820  WBC 9.3 5.9 6.2 6.6  HGB 11.5* 11.8* 10.4* 9.9*  HCT 35.5* 36.5 30.9* 29.9*  PLT 96.0* 107* 108* 117*  MCV 93.3 92.2 89.3 91.2  MCH  --  29.8 30.1 30.2  MCHC 32.4 32.3 33.7 33.1  RDW 19.0* 17.1* 17.4* 17.4*  LYMPHSABS 1.3 1.5  --   --   MONOABS 0.7 0.7  --   --   EOSABS 0.0 0.1  --   --   BASOSABS 0.0 0.0  --   --     Chemistries   Recent Labs Lab 07/24/13 1810 07/25/13 1525 07/25/13 1600 07/27/13 0820  NA 141 139 139 141  K 3.4* 3.6* 3.6* 4.4  CL 91* 94* 94* 100  CO2 26 21 20 26   GLUCOSE 181*  154* 149* 131*  BUN 42* 46* 46* 20  CREATININE 7.35* 8.78* 8.84* 6.25*  CALCIUM 9.5 8.8 8.7 8.9  AST 14  --  13  --   ALT 10  --  7  --   ALKPHOS 122*  --  113  --   BILITOT 0.9  --  0.5  --    ------------------------------------------------------------------------------------------------------------------ estimated creatinine clearance is 5.8 ml/min (by C-G formula based on Cr of 6.25). ------------------------------------------------------------------------------------------------------------------  Recent Labs  07/26/13 1420  HGBA1C 6.7*   ------------------------------------------------------------------------------------------------------------------ No results found for this basename: CHOL, HDL, LDLCALC, TRIG, CHOLHDL, LDLDIRECT,  in the last 72 hours ------------------------------------------------------------------------------------------------------------------ No results found for this basename: TSH, T4TOTAL, FREET3, T3FREE, THYROIDAB,  in the last 72 hours ------------------------------------------------------------------------------------------------------------------ No results found for this basename: VITAMINB12, FOLATE, FERRITIN, TIBC, IRON, RETICCTPCT,  in the last 72 hours  Coagulation profile  Recent Labs Lab 07/25/13 1614  INR 1.30    No results found for this basename: DDIMER,  in the last 72 hours  Cardiac Enzymes  Recent Labs Lab 07/24/13 2241  TROPONINI <0.30   ------------------------------------------------------------------------------------------------------------------ No components found with this basename: POCBNP,     Kjuan Seipp D.O. on 07/27/2013 at 12:16 PM  Between 7am to 7pm - Pager - [phone]  After 7pm go to [email or web address] - password [email or web address]  And look for the night coverage person covering for me after hours  [facility] Office  [phone]

## 2013-07-27 NOTE — Progress Notes (Signed)
Subjective:   On HD. Tired. Still having diarrhea.  Objective Filed Vitals:   07/27/13 0910 07/27/13 0915 07/27/13 0922 07/27/13 0930  BP: 71/37 85/46 82/43  76/43  Pulse: 80 81 80 86  Temp:      TempSrc:      Resp:      Height:      Weight:      SpO2:       Physical Exam General: alert and oriented. Sleeping, easily aroused. No acute distress.  Heart: RRR. No murmur Lungs: CTA, unlabored. Abdomen: soft, nontender. +BS Extremities: No LE edema Dialysis Access: R Hero- on HD  Dialysis Orders: TTS @ [facility]  3:45   64kgs    2K/2.25    160 400/1.5HD Heparin 3500  Profile 2   RHero  Hectorol 4 mcg IV/HD    Assessment/Plan:  1. N/V/D- work up per primary. Cdiff negative. WBC 5.9 afebrile. zofran for nausea. Likely gastroenteritis Still having diarrhea 2. UTI- ABD CT- UTI, possible cystitis. no pyelonephritis. IV ceftriaxone  3. ESRD - TTS @ [facility]. On HD olc green, uf goal 2L. under EDW, need to lower at DC  4. Hypertension/volume - 82/46- dropping on HD, norvasc. No volume excess.  Below dry wt, BP's low, stop norvasc, give IVF.    5. Anemia - hgb 9.9, will start low does esa, no outpt epo or iron. Watch CBC. Last tsat 31  6. Metabolic bone disease - Ca+8.7 Sensipar, Last PTH 1048, P 2.3 hold renvela/poor appetite  7. Nutrition - Alb 2.9  poor appetite. Full liquid diet Multivit. Renal diet when advanced  8. DM- per primary   Jetty Duhamel, NP [facility] Beeper [phone] 07/27/2013,9:36 AM  LOS: 3 days   I have seen and examined patient, discussed with PA and agree with assessment and plan as outlined above with additions as indicated. Vinson Moselle MD pager [phone]    cell [phone] 07/27/2013, 12:00 PM    Additional Objective Labs: Basic Metabolic Panel:  Recent Labs Lab 07/24/13 1810 07/25/13 1525 07/25/13 1600  NA 141 139 139  K 3.4* 3.6* 3.6*  CL 91* 94* 94*  CO2 26 21 20   GLUCOSE 181* 154* 149*  BUN 42* 46* 46*  CREATININE 7.35* 8.78*  8.84*  CALCIUM 9.5 8.8 8.7  PHOS  --  2.3  --    Liver Function Tests:  Recent Labs Lab 07/24/13 1810 07/25/13 1525 07/25/13 1600  AST 14  --  13  ALT 10  --  7  ALKPHOS 122*  --  113  BILITOT 0.9  --  0.5  PROT 8.4*  --  7.2  ALBUMIN 3.4* 2.9* 2.9*    Recent Labs Lab 07/24/13 2241  LIPASE 114*   CBC:  Recent Labs Lab 07/21/13 1138 07/24/13 1810 07/25/13 1614 07/27/13 0820  WBC 9.3 5.9 6.2 6.6  NEUTROABS 7.2 3.6  --   --   HGB 11.5* 11.8* 10.4* 9.9*  HCT 35.5* 36.5 30.9* 29.9*  MCV 93.3 92.2 89.3 91.2  PLT 96.0* 107* 108* 117*   Blood Culture    Component Value Date/Time   SDES STOOL 11/25/2010 0526   SPECREQUEST NONE 11/25/2010 0526   CULT  Value: NO SALMONELLA, SHIGELLA, CAMPYLOBACTER, OR YERSINIA ISOLATED Note: REDUCED NORMAL FLORA PRESENT 11/25/2010 0526   REPTSTATUS 11/28/2010 FINAL 11/25/2010 0526    Cardiac Enzymes:  Recent Labs Lab 07/24/13 2241  TROPONINI <0.30   CBG:  Recent Labs Lab 07/26/13 0756 07/26/13 1202 07/26/13 1730 07/26/13 2100 07/27/13  0719  GLUCAP 71 88 122* 98 89   Iron Studies: No results found for this basename: IRON, TIBC, TRANSFERRIN, FERRITIN,  in the last 72 hours @lablastinr3 @ Studies/Results: No results found. Medications:   . amLODipine  2.5 mg Oral Once per day on Sun Mon Wed Fri  . aspirin EC  81 mg Oral Daily  . atorvastatin  40 mg Oral q1800  . cefTRIAXone (ROCEPHIN)  IV  1 g Intravenous QHS  . cinacalcet  30 mg Oral Q breakfast  . escitalopram  5 mg Oral Daily  . feeding supplement (NEPRO CARB STEADY)  237 mL Oral Q24H  . heparin  3,500 Units Dialysis Once in dialysis  . heparin  5,000 Units Subcutaneous 3 times per day  . insulin aspart  0-9 Units Subcutaneous TID WC  . multivitamin  1 tablet Oral QHS  . pantoprazole  40 mg Oral BID AC  . quiNINE  648 mg Oral 3 times per day on Tue Thu Sat  . sodium chloride  3 mL Intravenous Q12H

## 2013-07-27 NOTE — Progress Notes (Signed)
Hypoglycemic Event  CBG: 47  Treatment: 15 GM carbohydrate snack  Symptoms: None  Follow-up CBG: Time:2255 CBG Result:68  Possible Reasons for Event: Inadequate meal intake  Comments/MD notified:Night coverage MD paged.  Will continue to monitor.    Anabel BeneQui, Beautifull Cisar  Remember to initiate Hypoglycemia Order Set & complete

## 2013-07-27 NOTE — Progress Notes (Signed)
Patient lost IV access.  IV team unable to obtain new access.  MD aware

## 2013-07-27 NOTE — Progress Notes (Signed)
Hypoglycemic Event  CBG: 68  Treatment: 15 GM carbohydrate snack  Symptoms: None  Follow-up CBG: Time:2337 CBG Result:74  Possible Reasons for Event: Inadequate meal intake  Comments/MD notified:Night MD paged.  Will continue to monitor.    Anabel BeneQui, Rucha Wissinger  Remember to initiate Hypoglycemia Order Set & complete

## 2013-07-28 DIAGNOSIS — D509 Iron deficiency anemia, unspecified: Secondary | ICD-10-CM | POA: Diagnosis not present

## 2013-07-28 DIAGNOSIS — N186 End stage renal disease: Secondary | ICD-10-CM | POA: Diagnosis not present

## 2013-07-28 DIAGNOSIS — R197 Diarrhea, unspecified: Secondary | ICD-10-CM | POA: Diagnosis not present

## 2013-07-28 DIAGNOSIS — N390 Urinary tract infection, site not specified: Secondary | ICD-10-CM | POA: Diagnosis not present

## 2013-07-28 DIAGNOSIS — R112 Nausea with vomiting, unspecified: Secondary | ICD-10-CM | POA: Diagnosis not present

## 2013-07-28 DIAGNOSIS — K219 Gastro-esophageal reflux disease without esophagitis: Secondary | ICD-10-CM | POA: Diagnosis not present

## 2013-07-28 LAB — GLUCOSE, CAPILLARY
GLUCOSE-CAPILLARY: 53 mg/dL — AB (ref 70–99)
GLUCOSE-CAPILLARY: 98 mg/dL (ref 70–99)
Glucose-Capillary: 97 mg/dL (ref 70–99)

## 2013-07-28 MED ORDER — NEPRO/CARBSTEADY PO LIQD: 237.0000 mL | ORAL | Status: DC

## 2013-07-28 MED ORDER — LOPERAMIDE HCL 2 MG PO CAPS: 2.0000 mg | ORAL_CAPSULE | ORAL | Status: DC | PRN

## 2013-07-28 MED ORDER — RENA-VITE PO TABS: 1.0000 | ORAL_TABLET | Freq: Every day | ORAL | Status: DC

## 2013-07-28 NOTE — Progress Notes (Signed)
Results for Cleaster CorinGATSON, Takeira W (MRN [id number]) as of 07/28/2013 12:42  Ref. Range 07/27/2013 22:57 07/27/2013 23:34 07/28/2013 08:15 07/28/2013 09:40 07/28/2013 12:04  Glucose-Capillary Latest Range: 70-99 mg/dL 68 (L) 74 53 (L) 97 98    CBGs less than 100 mg/dl.  Patient does have diabetes Type 2, ESRD.  HgbA1c is 6.7%. On dialysis.  Does not take any diabetes medications at home.  Will continue to follow while in hospital. Smith MinceKendra Nanda Bittick RN BSN CDE

## 2013-07-28 NOTE — Discharge Instructions (Signed)
Viral Gastroenteritis Viral gastroenteritis is also known as stomach flu. This condition affects the stomach and intestinal tract. It can cause sudden diarrhea and vomiting. The illness typically lasts 3 to 8 days. Most people develop an immune response that eventually gets rid of the virus. While this natural response develops, the virus can make you quite ill. CAUSES  Many different viruses can cause gastroenteritis, such as rotavirus or noroviruses. You can catch one of these viruses by consuming contaminated food or water. You may also catch a virus by sharing utensils or other personal items with an infected person or by touching a contaminated surface. SYMPTOMS  The most common symptoms are diarrhea and vomiting. These problems can cause a severe loss of body fluids (dehydration) and a body salt (electrolyte) imbalance. Other symptoms may include:  Fever.  Headache.  Fatigue.  Abdominal pain. DIAGNOSIS  Your caregiver can usually diagnose viral gastroenteritis based on your symptoms and a physical exam. A stool sample may also be taken to test for the presence of viruses or other infections. TREATMENT  This illness typically goes away on its own. Treatments are aimed at rehydration. The most serious cases of viral gastroenteritis involve vomiting so severely that you are not able to keep fluids down. In these cases, fluids must be given through an intravenous line (IV). HOME CARE INSTRUCTIONS   Drink enough fluids to keep your urine clear or pale yellow. Drink small amounts of fluids frequently and increase the amounts as tolerated.  Ask your caregiver for specific rehydration instructions.  Avoid:  Foods high in sugar.  Alcohol.  Carbonated drinks.  Tobacco.  Juice.  Caffeine drinks.  Extremely hot or cold fluids.  Fatty, greasy foods.  Too much intake of anything at one time.  Dairy products until 24 to 48 hours after diarrhea stops.  You may consume probiotics.  Probiotics are active cultures of beneficial bacteria. They may lessen the amount and number of diarrheal stools in adults. Probiotics can be found in yogurt with active cultures and in supplements.  Wash your hands well to avoid spreading the virus.  Only take over-the-counter or prescription medicines for pain, discomfort, or fever as directed by your caregiver. Do not give aspirin to children. Antidiarrheal medicines are not recommended.  Ask your caregiver if you should continue to take your regular prescribed and over-the-counter medicines.  Keep all follow-up appointments as directed by your caregiver. SEEK IMMEDIATE MEDICAL CARE IF:   You are unable to keep fluids down.  You do not urinate at least once every 6 to 8 hours.  You develop shortness of breath.  You notice blood in your stool or vomit. This may look like coffee grounds.  You have abdominal pain that increases or is concentrated in one small area (localized).  You have persistent vomiting or diarrhea.  You have a fever.  The patient is a child younger than 3 months, and he or she has a fever.  The patient is a child older than 3 months, and he or she has a fever and persistent symptoms.  The patient is a child older than 3 months, and he or she has a fever and symptoms suddenly get worse.  The patient is a baby, and he or she has no tears when crying. MAKE SURE YOU:   Understand these instructions.  Will watch your condition.  Will get help right away if you are not doing well or get worse. Document Released: 05/18/2005 Document Revised: 08/10/2011 Document Reviewed: 03/04/2011   ExitCare Patient Information 2014 [employer].  

## 2013-07-28 NOTE — Discharge Summary (Signed)
Physician Discharge Summary  Michelle CorinJosie W [name] NFA:[id number] DOB: 11/19/1928 DOA: 07/24/2013  PCP: Carrie MewJENKINS,JOHN EDWARD, MD  Admit date: 07/24/2013 Discharge date: 07/28/2013  Time spent: 35 minutes  Recommendations for Outpatient Follow-up:  Patient will be discharged home. She spoke to her primary care physician within one week of discharge. Patient to continue being her medications as prescribed. She should continue dialysis as scheduled.  Discharge Diagnoses:  Nausea vomiting, resolved GERD, stable Diarrhea, resolved UTI, treated Diabetes mellitus, stable Hypertension, stable End-stage renal disease requiring hemodialysis, stable Depression and anxiety, stable Hyperlipidemia, stable  Discharge Condition: Stable  Diet recommendation: Renal diet with 1200mL fluid restriciton  Filed Weights   07/27/13 1223 07/27/13 2122 07/28/13 0500  Weight: 62.1 kg (136 lb 14.5 oz) 61.6 kg (135 lb 12.9 oz) 61.9 kg (136 lb 7.4 oz)    History of present illness:  [name] is a 80 y.o. female with Past medical history of diabetes mellitus, GERD, hypertension, osteoarthritis, ESRD on hemodialysis.  The patient is coming from home.  The patient presented with complaints of nausea and vomiting that has been ongoing since last Monday. Along with that she also has some diarrhea. She has generalized weakness and has not been able to significantly move around. Her last dialysis treatment was on Saturday.  She saw his PCP on Friday and started her on Macrobid and since then she has been developing diarrhea without any blood. She also feels nauseous but no vomiting or abdominal pain.  She has chronic burning urination with blood occasionally that she saw last on Thursday.  She had nausea and multiple episodes 4 episodes of vomiting on a daily basis without any blood. It got worse with Macrobid.  Hospital Course:  Nausea/vomiting  -Resolved. Likely secondary to gastroenteritis  -Initially placed on  liquid diet, advance as tolerated. Currently able to tolerate renal diet.    GERD  -Continue PPI   Diarrhea  -Resolved, Likely secondary to gastroenteritis -C. difficile PCR negative  -Was placed on imodium.  UTI  -Pending urine culture  -Placed on Rocephin.  Has received full course.    Diabetes mellitus  -hemoglobin A1c 6.7 -Was placed on insulin sliding scale. -She is not on any home medications. This should be followed up in the her primary care physician.  Hypertension  -Controlled, continue amlodipine   End-stage renal disease requiring hemodialysis  -Nephrology consulted and following. -Continue Sensipar, renavit, renvela  -patient went to continue outpatient dialysis on Tuesday Thursday and Saturday.  Depression/anxiety  -Continue Lexapro   Hyperlipidemia  -Continue statin   Procedures: None  Consultations: Nephrology  Discharge Exam: Filed Vitals:   07/28/13 0837  BP: 119/49  Pulse: 64  Temp: 97.3 F (36.3 C)  Resp: 16   Exam  General: Well developed, well nourished, NAD, appears stated age  HEENT: NCAT, mucous membranes moist.  Neck: Supple, no JVD, no masses  Cardiovascular: S1 S2 auscultated, RRegular rate and rhythm.  Respiratory: Clear to auscultation bilaterally with equal chest rise  Abdomen: Soft, nontender, nondistended, + bowel sounds  Extremities: warm dry without cyanosis clubbing or edema  Neuro: AAOx3, no focal deficits.  Skin: Without rashes exudates or nodules  Psych: Normal affect and demeanor  Discharge Instructions      Discharge Orders   Future Appointments Provider Department Dept Phone   11/10/2013 2:45 PM Stacie GlazeJohn E Jenkins, MD [facility] [phone]   Future Orders Complete By Expires   Discharge instructions  As directed    Comments:  Patient will be discharged home. She spoke to her primary care physician within one week of discharge. Patient to continue being her medications as prescribed.  She should continue dialysis as scheduled.   Increase activity slowly  As directed        Medication List    STOP taking these medications       nitrofurantoin (macrocrystal-monohydrate) 100 MG capsule  Commonly known as:  MACROBID      TAKE these medications       amLODipine 5 MG tablet  Commonly known as:  NORVASC  Take 2.5 mg by mouth daily. Take half tablet on Sundays mondays wednesdays and fridays     aspirin 81 MG tablet  Take 81 mg by mouth daily.     cinacalcet 30 MG tablet  Commonly known as:  SENSIPAR  Take 30 mg by mouth daily.     COSOPT OP  Place 1 drop into both eyes daily.     escitalopram 10 MG tablet  Commonly known as:  LEXAPRO  TAKE ONE-HALF TABLET BY MOUTH EVERY DAY     feeding supplement (NEPRO CARB STEADY) Liqd  Take 237 mLs by mouth daily.     folic acid-vitamin b complex-vitamin c-selenium-zinc 3 MG Tabs tablet  Take 1 tablet by mouth daily.     levocetirizine 5 MG tablet  Commonly known as:  XYZAL  Take 5 mg by mouth daily. 1/2 once daily     loperamide 2 MG capsule  Commonly known as:  IMODIUM  Take 1 capsule (2 mg total) by mouth as needed for diarrhea or loose stools.     multivitamin Tabs tablet  Take 1 tablet by mouth at bedtime.     NEXIUM 40 MG capsule  Generic drug:  esomeprazole  TAKE 1 CAPSULE EVERY DAY     promethazine 25 MG tablet  Commonly known as:  PHENERGAN  Take 12.5 mg by mouth every 6 (six) hours as needed for nausea or vomiting.     quiNINE 324 MG capsule  Commonly known as:  QUALAQUIN  Take 648 mg by mouth 3 (three) times daily. Take on tuesdays thursdays and saturdays     sevelamer 800 MG tablet  Commonly known as:  RENAGEL  Take 800 mg by mouth 4 (four) times daily.     simvastatin 80 MG tablet  Commonly known as:  ZOCOR  TAKE ONE TABLET BY MOUTH EVERY DAY     TYLENOL EXTRA STRENGTH 167 MG/5ML Liqd  Generic drug:  Acetaminophen  Take 5 mLs by mouth daily as needed. For pain/fever       No Known  Allergies    The results of significant diagnostics from this hospitalization (including imaging, microbiology, ancillary and laboratory) are listed below for reference.    Significant Diagnostic Studies: Dg Chest 2 View  07/24/2013   CLINICAL DATA:  Nausea, vomiting.  Renal patient.  EXAM: CHEST  2 VIEW  COMPARISON:  07/04/2010 and prior chest radiographs  FINDINGS: Mild cardiomegaly noted.  A right central venous catheter is again noted with tip overlying the upper right atrium.  There is no evidence of focal airspace disease, pulmonary edema, suspicious pulmonary nodule/mass, pleural effusion, or pneumothorax. No acute bony abnormalities are identified.  IMPRESSION: No evidence of active cardiopulmonary disease.   Electronically Signed   By: Laveda Abbe M.D.   On: 07/24/2013 22:18   Ct Abdomen Pelvis W Contrast  07/24/2013   CLINICAL DATA:  80 year old female with abdominal and pelvic pain,  vomiting and diarrhea. Patient with end-stage renal disease.  EXAM: CT ABDOMEN AND PELVIS WITH CONTRAST  TECHNIQUE: Multidetector CT imaging of the abdomen and pelvis was performed using the standard protocol following bolus administration of intravenous contrast.  CONTRAST:  OMNIPAQUE IOHEXOL 300 MG/ML  SOLN  COMPARISON:  11/25/2010 CTA  FINDINGS: The liver, spleen, pancreas, and adrenal glands are unremarkable.  Enhancement of the right renal pelvis walls and right ureteral walls are noted compatible with infection/UTI. No definite evidence of pyelonephritis or renal abscess noted.  Severe bilateral renal atrophy is identified.  Circumferential bladder wall thickening is again noted and may be related to cystitis.  Cholelithiasis identified without CT evidence of acute cholecystitis.  There is no evidence of free fluid, enlarged lymph nodes, biliary dilation or abdominal aortic aneurysm.  There is no evidence of bowel obstruction, pneumoperitoneum or abscess.  The patient is status post hysterectomy.  No acute  or suspicious bony abnormalities are identified. Mild compression of the L3 superior endplate is unchanged.  IMPRESSION: Diffuse enhancement of the right renal pelvis and right ureteral walls compatible with infection/ UTI. No definite evidence of right pyelonephritis.  Circumferential bladder wall thickening which may represent cystitis.  Atrophic kidneys.  Cholelithiasis without CT evidence of acute cholecystitis.   Electronically Signed   By: Laveda Abbe M.D.   On: 07/24/2013 23:45    Microbiology: Recent Results (from the past 240 hour(s))  CLOSTRIDIUM DIFFICILE BY PCR     Status: None   Collection Time    07/25/13  2:25 AM      Result Value Ref Range Status   C difficile by pcr NEGATIVE  NEGATIVE Final  MRSA PCR SCREENING     Status: None   Collection Time    07/25/13  6:40 AM      Result Value Ref Range Status   MRSA by PCR NEGATIVE  NEGATIVE Final   Comment:            The GeneXpert MRSA Assay (FDA     approved for NASAL specimens     only), is one component of a     comprehensive MRSA colonization     surveillance program. It is not     intended to diagnose MRSA     infection nor to guide or     monitor treatment for     MRSA infections.     Labs: Basic Metabolic Panel:  Recent Labs Lab 07/24/13 1810 07/25/13 1525 07/25/13 1600 07/27/13 0820  NA 141 139 139 141  K 3.4* 3.6* 3.6* 4.4  CL 91* 94* 94* 100  CO2 26 21 20 26   GLUCOSE 181* 154* 149* 131*  BUN 42* 46* 46* 20  CREATININE 7.35* 8.78* 8.84* 6.25*  CALCIUM 9.5 8.8 8.7 8.9  PHOS  --  2.3  --  3.5   Liver Function Tests:  Recent Labs Lab 07/24/13 1810 07/25/13 1525 07/25/13 1600 07/27/13 0820  AST 14  --  13  --   ALT 10  --  7  --   ALKPHOS 122*  --  113  --   BILITOT 0.9  --  0.5  --   PROT 8.4*  --  7.2  --   ALBUMIN 3.4* 2.9* 2.9* 2.9*    Recent Labs Lab 07/24/13 2241  LIPASE 114*   No results found for this basename: AMMONIA,  in the last 168 hours CBC:  Recent Labs Lab  07/21/13 1138 07/24/13 1810 07/25/13 1614 07/27/13 0820  WBC 9.3 5.9 6.2 6.6  NEUTROABS 7.2 3.6  --   --   HGB 11.5* 11.8* 10.4* 9.9*  HCT 35.5* 36.5 30.9* 29.9*  MCV 93.3 92.2 89.3 91.2  PLT 96.0* 107* 108* 117*   Cardiac Enzymes:  Recent Labs Lab 07/24/13 2241  TROPONINI <0.30   BNP: BNP (last 3 results) No results found for this basename: PROBNP,  in the last 8760 hours CBG:  Recent Labs Lab 07/27/13 2233 07/27/13 2257 07/27/13 2334 07/28/13 0815 07/28/13 0940  GLUCAP 47* 68* 74 53* 97       Signed:  Goldman Birchall  [facility] 07/28/2013, 10:30 AM

## 2013-07-28 NOTE — Progress Notes (Signed)
[facility] Progress Note  Subjective:   I'm going home today after lunch. No more diarrhea, but not eating much - dislikes food.  Objective Filed Vitals:   07/27/13 2122 07/28/13 0500 07/28/13 0519 07/28/13 0837  BP: 108/44  122/42 119/49  Pulse: 81  76 64  Temp: 98.3 F (36.8 C)  98.5 F (36.9 C) 97.3 F (36.3 C)  TempSrc: Oral  Oral Oral  Resp: 16  16 16   Height:      Weight: 61.6 kg (135 lb 12.9 oz) 61.9 kg (136 lb 7.4 oz)    SpO2: 100%  98% 100%   Physical Exam General: NAD - alert - looks good Heart: RRR Lungs: no wheezes or rales Abdomen: soft NT Extremities: no edema Dialysis Access: R Hero- on HD + bruit  Dialysis Orders: TTS @ [facility]  3:45 64kgs 2K/2.25 160 400/1.5HD Heparin 3500 Profile 2 RHero  Hectorol 4 mcg IV/HD   Assessment/Plan:  1. N/V/D- work up per primary - improved Cdiff negative. WBC 5.9 afebrile. zofran for nausea. Likely gastroenteritis  2. UTI- ABD CT- UTI, possible cystitis. no pyelonephritis. IV ceftriaxone - no + cultures 3. ESRD - TTS @ [facility]. On HD olc green, uf goal 2L. under EDW, need to lower at DC; it is noted she is on qualaquin pre HD (Hd orders written for Sat in case she is not d/c today) 4. Hypertension/volume - BP better today - low yesterday. No volume excess. norvasc stopped, net UF 0 yesterday 5. Anemia - hgb 9.9, Aranesp 25 ordered for 2/26 but postponed until 2/28 no outpt epo or iron. Watch CBC. Last tsat 31 - resume Epo at d/c 6. Metabolic bone disease - Ca+8.7 Sensipar, Last PTH 1048, P 2.3 hold renvela/poor appetite  7. Nutrition - Alb 2.9 poor appetite. Full liquid diet Multivit. Renal diet when advanced  8. DM- per primary 9. Thrombocytopenia -plts trending up   Sheffield SliderMartha B Bergman, PA-C [facility] Beeper [phone] 07/28/2013,9:20 AM  LOS: 4 days   I have seen and examined patient, discussed with PA and agree with assessment and plan as outlined above. Vinson Moselleob Dymond Spreen MD pager [phone]     cell [phone] 07/28/2013, 1:28 PM    Additional Objective Labs: Basic Metabolic Panel:  Recent Labs Lab 07/25/13 1525 07/25/13 1600 07/27/13 0820  NA 139 139 141  K 3.6* 3.6* 4.4  CL 94* 94* 100  CO2 21 20 26   GLUCOSE 154* 149* 131*  BUN 46* 46* 20  CREATININE 8.78* 8.84* 6.25*  CALCIUM 8.8 8.7 8.9  PHOS 2.3  --  3.5   Liver Function Tests:  Recent Labs Lab 07/24/13 1810 07/25/13 1525 07/25/13 1600 07/27/13 0820  AST 14  --  13  --   ALT 10  --  7  --   ALKPHOS 122*  --  113  --   BILITOT 0.9  --  0.5  --   PROT 8.4*  --  7.2  --   ALBUMIN 3.4* 2.9* 2.9* 2.9*    Recent Labs Lab 07/24/13 2241  LIPASE 114*   CBC:  Recent Labs Lab 07/21/13 1138 07/24/13 1810 07/25/13 1614 07/27/13 0820  WBC 9.3 5.9 6.2 6.6  NEUTROABS 7.2 3.6  --   --   HGB 11.5* 11.8* 10.4* 9.9*  HCT 35.5* 36.5 30.9* 29.9*  MCV 93.3 92.2 89.3 91.2  PLT 96.0* 107* 108* 117*  Cardiac Enzymes:  Recent Labs Lab 07/24/13 2241  TROPONINI <0.30   CBG:  Recent  Labs Lab 07/27/13 1726 07/27/13 2233 07/27/13 2257 07/27/13 2334 07/28/13 0815  GLUCAP 90 47* 68* 74 53*  Medications:   . aspirin EC  81 mg Oral Daily  . atorvastatin  40 mg Oral q1800  . cefTRIAXone (ROCEPHIN)  IV  1 g Intravenous QHS  . cinacalcet  30 mg Oral Q breakfast  . [START ON 07/29/2013] darbepoetin  25 mcg Intravenous Q Sat-HD  . escitalopram  5 mg Oral Daily  . feeding supplement (NEPRO CARB STEADY)  237 mL Oral Q24H  . heparin  5,000 Units Subcutaneous 3 times per day  . insulin aspart  0-9 Units Subcutaneous TID WC  . multivitamin  1 tablet Oral QHS  . pantoprazole  40 mg Oral BID AC  . quiNINE  648 mg Oral 3 times per day on Tue Thu Sat  . sodium chloride  3 mL Intravenous Q12H

## 2013-07-29 DIAGNOSIS — N186 End stage renal disease: Secondary | ICD-10-CM | POA: Diagnosis not present

## 2013-07-31 DIAGNOSIS — E119 Type 2 diabetes mellitus without complications: Secondary | ICD-10-CM | POA: Diagnosis not present

## 2013-07-31 DIAGNOSIS — I10 Essential (primary) hypertension: Secondary | ICD-10-CM | POA: Diagnosis not present

## 2013-07-31 DIAGNOSIS — N186 End stage renal disease: Secondary | ICD-10-CM | POA: Diagnosis not present

## 2013-07-31 DIAGNOSIS — T859XXA Unspecified complication of internal prosthetic device, implant and graft, initial encounter: Secondary | ICD-10-CM | POA: Diagnosis not present

## 2013-08-01 DIAGNOSIS — D631 Anemia in chronic kidney disease: Secondary | ICD-10-CM | POA: Diagnosis not present

## 2013-08-01 DIAGNOSIS — N2581 Secondary hyperparathyroidism of renal origin: Secondary | ICD-10-CM | POA: Diagnosis not present

## 2013-08-01 DIAGNOSIS — N186 End stage renal disease: Secondary | ICD-10-CM | POA: Diagnosis not present

## 2013-08-02 ENCOUNTER — Other Ambulatory Visit: Payer: Self-pay | Admitting: Internal Medicine

## 2013-08-29 DIAGNOSIS — N186 End stage renal disease: Secondary | ICD-10-CM | POA: Diagnosis not present

## 2013-08-31 DIAGNOSIS — D631 Anemia in chronic kidney disease: Secondary | ICD-10-CM | POA: Diagnosis not present

## 2013-08-31 DIAGNOSIS — N2581 Secondary hyperparathyroidism of renal origin: Secondary | ICD-10-CM | POA: Diagnosis not present

## 2013-08-31 DIAGNOSIS — E1129 Type 2 diabetes mellitus with other diabetic kidney complication: Secondary | ICD-10-CM | POA: Diagnosis not present

## 2013-08-31 DIAGNOSIS — E785 Hyperlipidemia, unspecified: Secondary | ICD-10-CM | POA: Diagnosis not present

## 2013-08-31 DIAGNOSIS — N186 End stage renal disease: Secondary | ICD-10-CM | POA: Diagnosis not present

## 2013-09-04 DIAGNOSIS — L608 Other nail disorders: Secondary | ICD-10-CM | POA: Diagnosis not present

## 2013-09-04 DIAGNOSIS — I739 Peripheral vascular disease, unspecified: Secondary | ICD-10-CM | POA: Diagnosis not present

## 2013-09-04 DIAGNOSIS — E1059 Type 1 diabetes mellitus with other circulatory complications: Secondary | ICD-10-CM | POA: Diagnosis not present

## 2013-09-04 DIAGNOSIS — L84 Corns and callosities: Secondary | ICD-10-CM | POA: Diagnosis not present

## 2013-09-21 DIAGNOSIS — E1129 Type 2 diabetes mellitus with other diabetic kidney complication: Secondary | ICD-10-CM | POA: Diagnosis not present

## 2013-09-28 DIAGNOSIS — N186 End stage renal disease: Secondary | ICD-10-CM | POA: Diagnosis not present

## 2013-09-30 DIAGNOSIS — E1129 Type 2 diabetes mellitus with other diabetic kidney complication: Secondary | ICD-10-CM | POA: Diagnosis not present

## 2013-09-30 DIAGNOSIS — N186 End stage renal disease: Secondary | ICD-10-CM | POA: Diagnosis not present

## 2013-09-30 DIAGNOSIS — N039 Chronic nephritic syndrome with unspecified morphologic changes: Secondary | ICD-10-CM | POA: Diagnosis not present

## 2013-09-30 DIAGNOSIS — N2581 Secondary hyperparathyroidism of renal origin: Secondary | ICD-10-CM | POA: Diagnosis not present

## 2013-09-30 DIAGNOSIS — D631 Anemia in chronic kidney disease: Secondary | ICD-10-CM | POA: Diagnosis not present

## 2013-10-03 DIAGNOSIS — N2581 Secondary hyperparathyroidism of renal origin: Secondary | ICD-10-CM | POA: Diagnosis not present

## 2013-10-03 DIAGNOSIS — D631 Anemia in chronic kidney disease: Secondary | ICD-10-CM | POA: Diagnosis not present

## 2013-10-03 DIAGNOSIS — E1129 Type 2 diabetes mellitus with other diabetic kidney complication: Secondary | ICD-10-CM | POA: Diagnosis not present

## 2013-10-03 DIAGNOSIS — N186 End stage renal disease: Secondary | ICD-10-CM | POA: Diagnosis not present

## 2013-10-05 DIAGNOSIS — N186 End stage renal disease: Secondary | ICD-10-CM | POA: Diagnosis not present

## 2013-10-05 DIAGNOSIS — E1129 Type 2 diabetes mellitus with other diabetic kidney complication: Secondary | ICD-10-CM | POA: Diagnosis not present

## 2013-10-05 DIAGNOSIS — D631 Anemia in chronic kidney disease: Secondary | ICD-10-CM | POA: Diagnosis not present

## 2013-10-05 DIAGNOSIS — N2581 Secondary hyperparathyroidism of renal origin: Secondary | ICD-10-CM | POA: Diagnosis not present

## 2013-10-07 DIAGNOSIS — N186 End stage renal disease: Secondary | ICD-10-CM | POA: Diagnosis not present

## 2013-10-07 DIAGNOSIS — E1129 Type 2 diabetes mellitus with other diabetic kidney complication: Secondary | ICD-10-CM | POA: Diagnosis not present

## 2013-10-07 DIAGNOSIS — D631 Anemia in chronic kidney disease: Secondary | ICD-10-CM | POA: Diagnosis not present

## 2013-10-07 DIAGNOSIS — N2581 Secondary hyperparathyroidism of renal origin: Secondary | ICD-10-CM | POA: Diagnosis not present

## 2013-10-07 DIAGNOSIS — N039 Chronic nephritic syndrome with unspecified morphologic changes: Secondary | ICD-10-CM | POA: Diagnosis not present

## 2013-10-10 DIAGNOSIS — E1129 Type 2 diabetes mellitus with other diabetic kidney complication: Secondary | ICD-10-CM | POA: Diagnosis not present

## 2013-10-10 DIAGNOSIS — D631 Anemia in chronic kidney disease: Secondary | ICD-10-CM | POA: Diagnosis not present

## 2013-10-10 DIAGNOSIS — N039 Chronic nephritic syndrome with unspecified morphologic changes: Secondary | ICD-10-CM | POA: Diagnosis not present

## 2013-10-10 DIAGNOSIS — N186 End stage renal disease: Secondary | ICD-10-CM | POA: Diagnosis not present

## 2013-10-10 DIAGNOSIS — N2581 Secondary hyperparathyroidism of renal origin: Secondary | ICD-10-CM | POA: Diagnosis not present

## 2013-10-12 DIAGNOSIS — E1129 Type 2 diabetes mellitus with other diabetic kidney complication: Secondary | ICD-10-CM | POA: Diagnosis not present

## 2013-10-12 DIAGNOSIS — N186 End stage renal disease: Secondary | ICD-10-CM | POA: Diagnosis not present

## 2013-10-12 DIAGNOSIS — N2581 Secondary hyperparathyroidism of renal origin: Secondary | ICD-10-CM | POA: Diagnosis not present

## 2013-10-12 DIAGNOSIS — D631 Anemia in chronic kidney disease: Secondary | ICD-10-CM | POA: Diagnosis not present

## 2013-10-14 DIAGNOSIS — N2581 Secondary hyperparathyroidism of renal origin: Secondary | ICD-10-CM | POA: Diagnosis not present

## 2013-10-14 DIAGNOSIS — E1129 Type 2 diabetes mellitus with other diabetic kidney complication: Secondary | ICD-10-CM | POA: Diagnosis not present

## 2013-10-14 DIAGNOSIS — N186 End stage renal disease: Secondary | ICD-10-CM | POA: Diagnosis not present

## 2013-10-14 DIAGNOSIS — D631 Anemia in chronic kidney disease: Secondary | ICD-10-CM | POA: Diagnosis not present

## 2013-10-17 DIAGNOSIS — N186 End stage renal disease: Secondary | ICD-10-CM | POA: Diagnosis not present

## 2013-10-17 DIAGNOSIS — D631 Anemia in chronic kidney disease: Secondary | ICD-10-CM | POA: Diagnosis not present

## 2013-10-17 DIAGNOSIS — N2581 Secondary hyperparathyroidism of renal origin: Secondary | ICD-10-CM | POA: Diagnosis not present

## 2013-10-17 DIAGNOSIS — E1129 Type 2 diabetes mellitus with other diabetic kidney complication: Secondary | ICD-10-CM | POA: Diagnosis not present

## 2013-10-17 DIAGNOSIS — N039 Chronic nephritic syndrome with unspecified morphologic changes: Secondary | ICD-10-CM | POA: Diagnosis not present

## 2013-10-19 DIAGNOSIS — N2581 Secondary hyperparathyroidism of renal origin: Secondary | ICD-10-CM | POA: Diagnosis not present

## 2013-10-19 DIAGNOSIS — E1129 Type 2 diabetes mellitus with other diabetic kidney complication: Secondary | ICD-10-CM | POA: Diagnosis not present

## 2013-10-19 DIAGNOSIS — N186 End stage renal disease: Secondary | ICD-10-CM | POA: Diagnosis not present

## 2013-10-19 DIAGNOSIS — D631 Anemia in chronic kidney disease: Secondary | ICD-10-CM | POA: Diagnosis not present

## 2013-10-21 DIAGNOSIS — N2581 Secondary hyperparathyroidism of renal origin: Secondary | ICD-10-CM | POA: Diagnosis not present

## 2013-10-21 DIAGNOSIS — N039 Chronic nephritic syndrome with unspecified morphologic changes: Secondary | ICD-10-CM | POA: Diagnosis not present

## 2013-10-21 DIAGNOSIS — D631 Anemia in chronic kidney disease: Secondary | ICD-10-CM | POA: Diagnosis not present

## 2013-10-21 DIAGNOSIS — E1129 Type 2 diabetes mellitus with other diabetic kidney complication: Secondary | ICD-10-CM | POA: Diagnosis not present

## 2013-10-21 DIAGNOSIS — N186 End stage renal disease: Secondary | ICD-10-CM | POA: Diagnosis not present

## 2013-10-24 DIAGNOSIS — N039 Chronic nephritic syndrome with unspecified morphologic changes: Secondary | ICD-10-CM | POA: Diagnosis not present

## 2013-10-24 DIAGNOSIS — D631 Anemia in chronic kidney disease: Secondary | ICD-10-CM | POA: Diagnosis not present

## 2013-10-24 DIAGNOSIS — E1129 Type 2 diabetes mellitus with other diabetic kidney complication: Secondary | ICD-10-CM | POA: Diagnosis not present

## 2013-10-24 DIAGNOSIS — N186 End stage renal disease: Secondary | ICD-10-CM | POA: Diagnosis not present

## 2013-10-24 DIAGNOSIS — N2581 Secondary hyperparathyroidism of renal origin: Secondary | ICD-10-CM | POA: Diagnosis not present

## 2013-10-26 DIAGNOSIS — N039 Chronic nephritic syndrome with unspecified morphologic changes: Secondary | ICD-10-CM | POA: Diagnosis not present

## 2013-10-26 DIAGNOSIS — N186 End stage renal disease: Secondary | ICD-10-CM | POA: Diagnosis not present

## 2013-10-26 DIAGNOSIS — D631 Anemia in chronic kidney disease: Secondary | ICD-10-CM | POA: Diagnosis not present

## 2013-10-26 DIAGNOSIS — E1129 Type 2 diabetes mellitus with other diabetic kidney complication: Secondary | ICD-10-CM | POA: Diagnosis not present

## 2013-10-26 DIAGNOSIS — N2581 Secondary hyperparathyroidism of renal origin: Secondary | ICD-10-CM | POA: Diagnosis not present

## 2013-10-29 DIAGNOSIS — N186 End stage renal disease: Secondary | ICD-10-CM | POA: Diagnosis not present

## 2013-10-30 DIAGNOSIS — N2581 Secondary hyperparathyroidism of renal origin: Secondary | ICD-10-CM | POA: Diagnosis not present

## 2013-10-30 DIAGNOSIS — D631 Anemia in chronic kidney disease: Secondary | ICD-10-CM | POA: Diagnosis not present

## 2013-10-30 DIAGNOSIS — E1129 Type 2 diabetes mellitus with other diabetic kidney complication: Secondary | ICD-10-CM | POA: Diagnosis not present

## 2013-10-30 DIAGNOSIS — E213 Hyperparathyroidism, unspecified: Secondary | ICD-10-CM | POA: Diagnosis not present

## 2013-10-30 DIAGNOSIS — N039 Chronic nephritic syndrome with unspecified morphologic changes: Secondary | ICD-10-CM | POA: Diagnosis not present

## 2013-10-30 DIAGNOSIS — N186 End stage renal disease: Secondary | ICD-10-CM | POA: Diagnosis not present

## 2013-10-31 ENCOUNTER — Telehealth: Payer: Self-pay | Admitting: Internal Medicine

## 2013-10-31 DIAGNOSIS — D631 Anemia in chronic kidney disease: Secondary | ICD-10-CM | POA: Diagnosis not present

## 2013-10-31 DIAGNOSIS — E1129 Type 2 diabetes mellitus with other diabetic kidney complication: Secondary | ICD-10-CM | POA: Diagnosis not present

## 2013-10-31 DIAGNOSIS — N186 End stage renal disease: Secondary | ICD-10-CM | POA: Diagnosis not present

## 2013-10-31 DIAGNOSIS — N2581 Secondary hyperparathyroidism of renal origin: Secondary | ICD-10-CM | POA: Diagnosis not present

## 2013-10-31 DIAGNOSIS — E213 Hyperparathyroidism, unspecified: Secondary | ICD-10-CM | POA: Diagnosis not present

## 2013-10-31 NOTE — Telephone Encounter (Signed)
[employer] denied PA quiNINE (QUALAQUIN) 324 MG capsule. States medication is not FDA approved to treat leg cramps.

## 2013-11-02 DIAGNOSIS — N186 End stage renal disease: Secondary | ICD-10-CM | POA: Diagnosis not present

## 2013-11-02 DIAGNOSIS — E213 Hyperparathyroidism, unspecified: Secondary | ICD-10-CM | POA: Diagnosis not present

## 2013-11-02 DIAGNOSIS — N2581 Secondary hyperparathyroidism of renal origin: Secondary | ICD-10-CM | POA: Diagnosis not present

## 2013-11-02 DIAGNOSIS — E1129 Type 2 diabetes mellitus with other diabetic kidney complication: Secondary | ICD-10-CM | POA: Diagnosis not present

## 2013-11-02 DIAGNOSIS — D631 Anemia in chronic kidney disease: Secondary | ICD-10-CM | POA: Diagnosis not present

## 2013-11-02 DIAGNOSIS — N039 Chronic nephritic syndrome with unspecified morphologic changes: Secondary | ICD-10-CM | POA: Diagnosis not present

## 2013-11-04 DIAGNOSIS — E213 Hyperparathyroidism, unspecified: Secondary | ICD-10-CM | POA: Diagnosis not present

## 2013-11-04 DIAGNOSIS — N186 End stage renal disease: Secondary | ICD-10-CM | POA: Diagnosis not present

## 2013-11-04 DIAGNOSIS — D631 Anemia in chronic kidney disease: Secondary | ICD-10-CM | POA: Diagnosis not present

## 2013-11-04 DIAGNOSIS — N2581 Secondary hyperparathyroidism of renal origin: Secondary | ICD-10-CM | POA: Diagnosis not present

## 2013-11-04 DIAGNOSIS — E1129 Type 2 diabetes mellitus with other diabetic kidney complication: Secondary | ICD-10-CM | POA: Diagnosis not present

## 2013-11-07 DIAGNOSIS — N2581 Secondary hyperparathyroidism of renal origin: Secondary | ICD-10-CM | POA: Diagnosis not present

## 2013-11-07 DIAGNOSIS — E1129 Type 2 diabetes mellitus with other diabetic kidney complication: Secondary | ICD-10-CM | POA: Diagnosis not present

## 2013-11-07 DIAGNOSIS — N186 End stage renal disease: Secondary | ICD-10-CM | POA: Diagnosis not present

## 2013-11-07 DIAGNOSIS — D631 Anemia in chronic kidney disease: Secondary | ICD-10-CM | POA: Diagnosis not present

## 2013-11-07 DIAGNOSIS — E213 Hyperparathyroidism, unspecified: Secondary | ICD-10-CM | POA: Diagnosis not present

## 2013-11-09 DIAGNOSIS — E213 Hyperparathyroidism, unspecified: Secondary | ICD-10-CM | POA: Diagnosis not present

## 2013-11-09 DIAGNOSIS — E1129 Type 2 diabetes mellitus with other diabetic kidney complication: Secondary | ICD-10-CM | POA: Diagnosis not present

## 2013-11-09 DIAGNOSIS — N2581 Secondary hyperparathyroidism of renal origin: Secondary | ICD-10-CM | POA: Diagnosis not present

## 2013-11-09 DIAGNOSIS — N186 End stage renal disease: Secondary | ICD-10-CM | POA: Diagnosis not present

## 2013-11-09 DIAGNOSIS — D631 Anemia in chronic kidney disease: Secondary | ICD-10-CM | POA: Diagnosis not present

## 2013-11-10 ENCOUNTER — Encounter: Payer: Self-pay | Admitting: Internal Medicine

## 2013-11-10 ENCOUNTER — Ambulatory Visit (INDEPENDENT_AMBULATORY_CARE_PROVIDER_SITE_OTHER): Payer: Medicare Other | Admitting: Internal Medicine

## 2013-11-10 VITALS — BP 118/74 | HR 81 | Temp 98.7°F | Resp 18 | Ht 61.0 in | Wt 133.0 lb

## 2013-11-10 DIAGNOSIS — E119 Type 2 diabetes mellitus without complications: Secondary | ICD-10-CM | POA: Diagnosis not present

## 2013-11-10 DIAGNOSIS — I10 Essential (primary) hypertension: Secondary | ICD-10-CM

## 2013-11-10 LAB — LIPID PANEL
CHOL/HDL RATIO: 2
Cholesterol: 145 mg/dL (ref 0–200)
HDL: 58.1 mg/dL (ref >39.00)
LDL Cholesterol: 64 mg/dL (ref 0–99)
NONHDL: 86.9
Triglycerides: 113 mg/dL (ref 0.0–149.0)
VLDL: 22.6 mg/dL (ref 0.0–40.0)

## 2013-11-10 LAB — HEMOGLOBIN A1C: Hgb A1c MFr Bld: 5.8 % (ref 4.6–6.5)

## 2013-11-10 LAB — TSH: TSH: 1.79 u[IU]/mL (ref 0.35–4.50)

## 2013-11-10 NOTE — Progress Notes (Signed)
Pre-visit discussion using our clinic review tool. No additional management support is needed unless otherwise documented below in the visit note.  

## 2013-11-10 NOTE — Progress Notes (Signed)
Subjective:    Patient ID: [name], female    DOB: 06-09-1928, 80 y.o.   MRN: [id number]  HPI Patient with ESRD on dialysis follow up for DM and HTN Stable Patient has not had lipid and A1c and needs screening   Review of Systems  Constitutional: Positive for fatigue.  HENT: Negative.   Respiratory: Negative.   Cardiovascular: Negative.   Gastrointestinal: Negative.   Neurological: Positive for weakness.  Psychiatric/Behavioral: Positive for decreased concentration.   Past Medical History  Diagnosis Date  . Cough   . Diabetes mellitus     type 2  . GERD (gastroesophageal reflux disease)   . Hypertension   . Bronchitis   . Osteoarthritis   . End stage renal disease   . Hemodialysis patient     esrd    History   Social History  . Marital Status: Married    Spouse Name: N/A    Number of Children: N/A  . Years of Education: N/A   Occupational History  . Not on file.   Social History Main Topics  . Smoking status: Never Smoker   . Smokeless tobacco: Not on file  . Alcohol Use: No  . Drug Use: No  . Sexual Activity: No   Other Topics Concern  . Not on file   Social History Narrative  . No narrative on file    Past Surgical History  Procedure Laterality Date  . Eye surgery      Cataract extraction    Family History  Problem Relation Age of Onset  . Hypertension Mother     No Known Allergies  Current Outpatient Prescriptions on File Prior to Visit  Medication Sig Dispense Refill  . Acetaminophen (TYLENOL EXTRA STRENGTH) 167 MG/5ML LIQD Take 5 mLs by mouth daily as needed. For pain/fever      . amLODipine (NORVASC) 5 MG tablet Take 2.5 mg by mouth daily. Take half tablet on Sundays mondays wednesdays and fridays      . aspirin 81 MG tablet Take 81 mg by mouth daily.      . cinacalcet (SENSIPAR) 30 MG tablet Take 30 mg by mouth daily.      . Dorzolamide HCl-Timolol Mal (COSOPT OP) Place 1 drop into both eyes daily.       [name]. escitalopram  (LEXAPRO) 10 MG tablet TAKE ONE-HALF TABLET BY MOUTH ONCE DAILY  90 tablet  0  . folic acid-vitamin b complex-vitamin c-selenium-zinc (DIALYVITE) 3 MG TABS Take 1 tablet by mouth daily.        [name]. levocetirizine (XYZAL) 5 MG tablet Take 5 mg by mouth daily. 1/2 once daily      . loperamide (IMODIUM) 2 MG capsule Take 1 capsule (2 mg total) by mouth as needed for diarrhea or loose stools.  30 capsule  0  . multivitamin (RENA-VIT) TABS tablet Take 1 tablet by mouth at bedtime.  30 tablet  0  . NEXIUM 40 MG capsule TAKE 1 CAPSULE EVERY DAY  90 capsule  1  . Nutritional Supplements (FEEDING SUPPLEMENT, NEPRO CARB STEADY,) LIQD Take 237 mLs by mouth daily.    0  . promethazine (PHENERGAN) 25 MG tablet Take 12.5 mg by mouth every 6 (six) hours as needed for nausea or vomiting.      . quiNINE (QUALAQUIN) 324 MG capsule Take 648 mg by mouth 3 (three) times daily. Take on tuesdays thursdays and saturdays      . sevelamer (RENAGEL) 800 MG tablet Take 800  mg by mouth 4 (four) times daily.       . simvastatin (ZOCOR) 80 MG tablet TAKE ONE TABLET BY MOUTH EVERY DAY  30 tablet  11   No current facility-administered medications on file prior to visit.    BP 118/74  Pulse 81  Temp(Src) 98.7 F (37.1 C) (Oral)  Resp 18  Ht 5\' 1"  (1.549 m)  Wt 133 lb (60.328 kg)  BMI 25.14 kg/m2  SpO2 97%       Objective:   Physical Exam  Nursing note and vitals reviewed. Constitutional: She appears well-developed and well-nourished.  HENT:  Head: Normocephalic and atraumatic.  Cardiovascular: Normal rate and regular rhythm.   Murmur heard. Abdominal: Soft. Bowel sounds are normal.  Skin: Skin is dry.  Psychiatric: She has a normal mood and affect. Her behavior is normal.          Assessment & Plan:  ESRD  Stable DM check A1c Lipid screening  Follow up at dialysis and [facility]

## 2013-11-10 NOTE — Patient Instructions (Signed)
The patient is instructed to continue all medications as prescribed. Schedule followup with check out clerk upon leaving the clinic  

## 2013-11-11 ENCOUNTER — Telehealth: Payer: Self-pay | Admitting: Internal Medicine

## 2013-11-11 DIAGNOSIS — N039 Chronic nephritic syndrome with unspecified morphologic changes: Secondary | ICD-10-CM | POA: Diagnosis not present

## 2013-11-11 DIAGNOSIS — D631 Anemia in chronic kidney disease: Secondary | ICD-10-CM | POA: Diagnosis not present

## 2013-11-11 DIAGNOSIS — N186 End stage renal disease: Secondary | ICD-10-CM | POA: Diagnosis not present

## 2013-11-11 DIAGNOSIS — E213 Hyperparathyroidism, unspecified: Secondary | ICD-10-CM | POA: Diagnosis not present

## 2013-11-11 DIAGNOSIS — E1129 Type 2 diabetes mellitus with other diabetic kidney complication: Secondary | ICD-10-CM | POA: Diagnosis not present

## 2013-11-11 DIAGNOSIS — N2581 Secondary hyperparathyroidism of renal origin: Secondary | ICD-10-CM | POA: Diagnosis not present

## 2013-11-11 NOTE — Telephone Encounter (Signed)
Relevant patient education mailed to patient.  

## 2013-11-14 DIAGNOSIS — N186 End stage renal disease: Secondary | ICD-10-CM | POA: Diagnosis not present

## 2013-11-14 DIAGNOSIS — D631 Anemia in chronic kidney disease: Secondary | ICD-10-CM | POA: Diagnosis not present

## 2013-11-14 DIAGNOSIS — E213 Hyperparathyroidism, unspecified: Secondary | ICD-10-CM | POA: Diagnosis not present

## 2013-11-14 DIAGNOSIS — N2581 Secondary hyperparathyroidism of renal origin: Secondary | ICD-10-CM | POA: Diagnosis not present

## 2013-11-14 DIAGNOSIS — E1129 Type 2 diabetes mellitus with other diabetic kidney complication: Secondary | ICD-10-CM | POA: Diagnosis not present

## 2013-11-16 DIAGNOSIS — E1129 Type 2 diabetes mellitus with other diabetic kidney complication: Secondary | ICD-10-CM | POA: Diagnosis not present

## 2013-11-16 DIAGNOSIS — D631 Anemia in chronic kidney disease: Secondary | ICD-10-CM | POA: Diagnosis not present

## 2013-11-16 DIAGNOSIS — E213 Hyperparathyroidism, unspecified: Secondary | ICD-10-CM | POA: Diagnosis not present

## 2013-11-16 DIAGNOSIS — N186 End stage renal disease: Secondary | ICD-10-CM | POA: Diagnosis not present

## 2013-11-16 DIAGNOSIS — N2581 Secondary hyperparathyroidism of renal origin: Secondary | ICD-10-CM | POA: Diagnosis not present

## 2013-11-16 DIAGNOSIS — N039 Chronic nephritic syndrome with unspecified morphologic changes: Secondary | ICD-10-CM | POA: Diagnosis not present

## 2013-11-18 DIAGNOSIS — E213 Hyperparathyroidism, unspecified: Secondary | ICD-10-CM | POA: Diagnosis not present

## 2013-11-18 DIAGNOSIS — E1129 Type 2 diabetes mellitus with other diabetic kidney complication: Secondary | ICD-10-CM | POA: Diagnosis not present

## 2013-11-18 DIAGNOSIS — N039 Chronic nephritic syndrome with unspecified morphologic changes: Secondary | ICD-10-CM | POA: Diagnosis not present

## 2013-11-18 DIAGNOSIS — N186 End stage renal disease: Secondary | ICD-10-CM | POA: Diagnosis not present

## 2013-11-18 DIAGNOSIS — D631 Anemia in chronic kidney disease: Secondary | ICD-10-CM | POA: Diagnosis not present

## 2013-11-18 DIAGNOSIS — N2581 Secondary hyperparathyroidism of renal origin: Secondary | ICD-10-CM | POA: Diagnosis not present

## 2013-11-21 DIAGNOSIS — N2581 Secondary hyperparathyroidism of renal origin: Secondary | ICD-10-CM | POA: Diagnosis not present

## 2013-11-21 DIAGNOSIS — N039 Chronic nephritic syndrome with unspecified morphologic changes: Secondary | ICD-10-CM | POA: Diagnosis not present

## 2013-11-21 DIAGNOSIS — E213 Hyperparathyroidism, unspecified: Secondary | ICD-10-CM | POA: Diagnosis not present

## 2013-11-21 DIAGNOSIS — N186 End stage renal disease: Secondary | ICD-10-CM | POA: Diagnosis not present

## 2013-11-21 DIAGNOSIS — D631 Anemia in chronic kidney disease: Secondary | ICD-10-CM | POA: Diagnosis not present

## 2013-11-21 DIAGNOSIS — E1129 Type 2 diabetes mellitus with other diabetic kidney complication: Secondary | ICD-10-CM | POA: Diagnosis not present

## 2013-11-23 DIAGNOSIS — E213 Hyperparathyroidism, unspecified: Secondary | ICD-10-CM | POA: Diagnosis not present

## 2013-11-23 DIAGNOSIS — E1129 Type 2 diabetes mellitus with other diabetic kidney complication: Secondary | ICD-10-CM | POA: Diagnosis not present

## 2013-11-23 DIAGNOSIS — D631 Anemia in chronic kidney disease: Secondary | ICD-10-CM | POA: Diagnosis not present

## 2013-11-23 DIAGNOSIS — N186 End stage renal disease: Secondary | ICD-10-CM | POA: Diagnosis not present

## 2013-11-23 DIAGNOSIS — N2581 Secondary hyperparathyroidism of renal origin: Secondary | ICD-10-CM | POA: Diagnosis not present

## 2013-11-25 DIAGNOSIS — E213 Hyperparathyroidism, unspecified: Secondary | ICD-10-CM | POA: Diagnosis not present

## 2013-11-25 DIAGNOSIS — E1129 Type 2 diabetes mellitus with other diabetic kidney complication: Secondary | ICD-10-CM | POA: Diagnosis not present

## 2013-11-25 DIAGNOSIS — N039 Chronic nephritic syndrome with unspecified morphologic changes: Secondary | ICD-10-CM | POA: Diagnosis not present

## 2013-11-25 DIAGNOSIS — N186 End stage renal disease: Secondary | ICD-10-CM | POA: Diagnosis not present

## 2013-11-25 DIAGNOSIS — D631 Anemia in chronic kidney disease: Secondary | ICD-10-CM | POA: Diagnosis not present

## 2013-11-25 DIAGNOSIS — N2581 Secondary hyperparathyroidism of renal origin: Secondary | ICD-10-CM | POA: Diagnosis not present

## 2013-11-28 DIAGNOSIS — E1129 Type 2 diabetes mellitus with other diabetic kidney complication: Secondary | ICD-10-CM | POA: Diagnosis not present

## 2013-11-28 DIAGNOSIS — D631 Anemia in chronic kidney disease: Secondary | ICD-10-CM | POA: Diagnosis not present

## 2013-11-28 DIAGNOSIS — N2581 Secondary hyperparathyroidism of renal origin: Secondary | ICD-10-CM | POA: Diagnosis not present

## 2013-11-28 DIAGNOSIS — E213 Hyperparathyroidism, unspecified: Secondary | ICD-10-CM | POA: Diagnosis not present

## 2013-11-28 DIAGNOSIS — N039 Chronic nephritic syndrome with unspecified morphologic changes: Secondary | ICD-10-CM | POA: Diagnosis not present

## 2013-11-28 DIAGNOSIS — N186 End stage renal disease: Secondary | ICD-10-CM | POA: Diagnosis not present

## 2013-11-30 DIAGNOSIS — N2581 Secondary hyperparathyroidism of renal origin: Secondary | ICD-10-CM | POA: Diagnosis not present

## 2013-11-30 DIAGNOSIS — D631 Anemia in chronic kidney disease: Secondary | ICD-10-CM | POA: Diagnosis not present

## 2013-11-30 DIAGNOSIS — N186 End stage renal disease: Secondary | ICD-10-CM | POA: Diagnosis not present

## 2013-11-30 DIAGNOSIS — E785 Hyperlipidemia, unspecified: Secondary | ICD-10-CM | POA: Diagnosis not present

## 2013-11-30 DIAGNOSIS — E1129 Type 2 diabetes mellitus with other diabetic kidney complication: Secondary | ICD-10-CM | POA: Diagnosis not present

## 2013-11-30 DIAGNOSIS — N039 Chronic nephritic syndrome with unspecified morphologic changes: Secondary | ICD-10-CM | POA: Diagnosis not present

## 2013-12-06 ENCOUNTER — Other Ambulatory Visit: Payer: Self-pay | Admitting: *Deleted

## 2013-12-06 MED ORDER — SIMVASTATIN 80 MG PO TABS: ORAL_TABLET | ORAL | Status: DC

## 2013-12-18 DIAGNOSIS — I739 Peripheral vascular disease, unspecified: Secondary | ICD-10-CM | POA: Diagnosis not present

## 2013-12-18 DIAGNOSIS — L84 Corns and callosities: Secondary | ICD-10-CM | POA: Diagnosis not present

## 2013-12-18 DIAGNOSIS — L608 Other nail disorders: Secondary | ICD-10-CM | POA: Diagnosis not present

## 2013-12-18 DIAGNOSIS — R269 Unspecified abnormalities of gait and mobility: Secondary | ICD-10-CM | POA: Diagnosis not present

## 2013-12-18 DIAGNOSIS — E1059 Type 1 diabetes mellitus with other circulatory complications: Secondary | ICD-10-CM | POA: Diagnosis not present

## 2013-12-20 DIAGNOSIS — N186 End stage renal disease: Secondary | ICD-10-CM | POA: Diagnosis not present

## 2013-12-20 DIAGNOSIS — T82898A Other specified complication of vascular prosthetic devices, implants and grafts, initial encounter: Secondary | ICD-10-CM | POA: Diagnosis not present

## 2013-12-20 DIAGNOSIS — I10 Essential (primary) hypertension: Secondary | ICD-10-CM | POA: Diagnosis not present

## 2013-12-20 DIAGNOSIS — E119 Type 2 diabetes mellitus without complications: Secondary | ICD-10-CM | POA: Diagnosis not present

## 2013-12-21 DIAGNOSIS — E1129 Type 2 diabetes mellitus with other diabetic kidney complication: Secondary | ICD-10-CM | POA: Diagnosis not present

## 2013-12-25 DIAGNOSIS — H4720 Unspecified optic atrophy: Secondary | ICD-10-CM | POA: Diagnosis not present

## 2013-12-25 DIAGNOSIS — E11359 Type 2 diabetes mellitus with proliferative diabetic retinopathy without macular edema: Secondary | ICD-10-CM | POA: Diagnosis not present

## 2013-12-25 DIAGNOSIS — E1139 Type 2 diabetes mellitus with other diabetic ophthalmic complication: Secondary | ICD-10-CM | POA: Diagnosis not present

## 2013-12-29 DIAGNOSIS — N186 End stage renal disease: Secondary | ICD-10-CM | POA: Diagnosis not present

## 2013-12-30 DIAGNOSIS — D631 Anemia in chronic kidney disease: Secondary | ICD-10-CM | POA: Diagnosis not present

## 2013-12-30 DIAGNOSIS — E1129 Type 2 diabetes mellitus with other diabetic kidney complication: Secondary | ICD-10-CM | POA: Diagnosis not present

## 2013-12-30 DIAGNOSIS — N186 End stage renal disease: Secondary | ICD-10-CM | POA: Diagnosis not present

## 2013-12-30 DIAGNOSIS — N039 Chronic nephritic syndrome with unspecified morphologic changes: Secondary | ICD-10-CM | POA: Diagnosis not present

## 2013-12-30 DIAGNOSIS — N2581 Secondary hyperparathyroidism of renal origin: Secondary | ICD-10-CM | POA: Diagnosis not present

## 2014-01-29 DIAGNOSIS — N186 End stage renal disease: Secondary | ICD-10-CM | POA: Diagnosis not present

## 2014-01-30 DIAGNOSIS — E1129 Type 2 diabetes mellitus with other diabetic kidney complication: Secondary | ICD-10-CM | POA: Diagnosis not present

## 2014-01-30 DIAGNOSIS — N2581 Secondary hyperparathyroidism of renal origin: Secondary | ICD-10-CM | POA: Diagnosis not present

## 2014-01-30 DIAGNOSIS — D631 Anemia in chronic kidney disease: Secondary | ICD-10-CM | POA: Diagnosis not present

## 2014-01-30 DIAGNOSIS — N186 End stage renal disease: Secondary | ICD-10-CM | POA: Diagnosis not present

## 2014-02-06 ENCOUNTER — Other Ambulatory Visit: Payer: Self-pay | Admitting: Internal Medicine

## 2014-02-16 DIAGNOSIS — IMO0001 Reserved for inherently not codable concepts without codable children: Secondary | ICD-10-CM | POA: Diagnosis not present

## 2014-02-28 DIAGNOSIS — N186 End stage renal disease: Secondary | ICD-10-CM | POA: Diagnosis not present

## 2014-03-01 DIAGNOSIS — N2581 Secondary hyperparathyroidism of renal origin: Secondary | ICD-10-CM | POA: Diagnosis not present

## 2014-03-01 DIAGNOSIS — N186 End stage renal disease: Secondary | ICD-10-CM | POA: Diagnosis not present

## 2014-03-01 DIAGNOSIS — D631 Anemia in chronic kidney disease: Secondary | ICD-10-CM | POA: Diagnosis not present

## 2014-03-01 DIAGNOSIS — Z23 Encounter for immunization: Secondary | ICD-10-CM | POA: Diagnosis not present

## 2014-03-31 DIAGNOSIS — N186 End stage renal disease: Secondary | ICD-10-CM | POA: Diagnosis not present

## 2014-03-31 DIAGNOSIS — Z992 Dependence on renal dialysis: Secondary | ICD-10-CM | POA: Diagnosis not present

## 2014-04-03 DIAGNOSIS — D509 Iron deficiency anemia, unspecified: Secondary | ICD-10-CM | POA: Diagnosis not present

## 2014-04-03 DIAGNOSIS — N186 End stage renal disease: Secondary | ICD-10-CM | POA: Diagnosis not present

## 2014-04-03 DIAGNOSIS — D631 Anemia in chronic kidney disease: Secondary | ICD-10-CM | POA: Diagnosis not present

## 2014-04-03 DIAGNOSIS — N2581 Secondary hyperparathyroidism of renal origin: Secondary | ICD-10-CM | POA: Diagnosis not present

## 2014-04-09 DIAGNOSIS — L603 Nail dystrophy: Secondary | ICD-10-CM | POA: Diagnosis not present

## 2014-04-09 DIAGNOSIS — L84 Corns and callosities: Secondary | ICD-10-CM | POA: Diagnosis not present

## 2014-04-09 DIAGNOSIS — E1151 Type 2 diabetes mellitus with diabetic peripheral angiopathy without gangrene: Secondary | ICD-10-CM | POA: Diagnosis not present

## 2014-04-09 DIAGNOSIS — I739 Peripheral vascular disease, unspecified: Secondary | ICD-10-CM | POA: Diagnosis not present

## 2014-04-30 DIAGNOSIS — Z992 Dependence on renal dialysis: Secondary | ICD-10-CM | POA: Diagnosis not present

## 2014-04-30 DIAGNOSIS — N186 End stage renal disease: Secondary | ICD-10-CM | POA: Diagnosis not present

## 2014-05-01 DIAGNOSIS — D631 Anemia in chronic kidney disease: Secondary | ICD-10-CM | POA: Diagnosis not present

## 2014-05-01 DIAGNOSIS — N2581 Secondary hyperparathyroidism of renal origin: Secondary | ICD-10-CM | POA: Diagnosis not present

## 2014-05-01 DIAGNOSIS — N186 End stage renal disease: Secondary | ICD-10-CM | POA: Diagnosis not present

## 2014-05-01 DIAGNOSIS — D509 Iron deficiency anemia, unspecified: Secondary | ICD-10-CM | POA: Diagnosis not present

## 2014-05-11 DIAGNOSIS — N186 End stage renal disease: Secondary | ICD-10-CM | POA: Diagnosis not present

## 2014-05-31 DIAGNOSIS — N186 End stage renal disease: Secondary | ICD-10-CM | POA: Diagnosis not present

## 2014-05-31 DIAGNOSIS — Z992 Dependence on renal dialysis: Secondary | ICD-10-CM | POA: Diagnosis not present

## 2014-06-03 DIAGNOSIS — D509 Iron deficiency anemia, unspecified: Secondary | ICD-10-CM | POA: Diagnosis not present

## 2014-06-03 DIAGNOSIS — N186 End stage renal disease: Secondary | ICD-10-CM | POA: Diagnosis not present

## 2014-06-03 DIAGNOSIS — N2581 Secondary hyperparathyroidism of renal origin: Secondary | ICD-10-CM | POA: Diagnosis not present

## 2014-06-03 DIAGNOSIS — D631 Anemia in chronic kidney disease: Secondary | ICD-10-CM | POA: Diagnosis not present

## 2014-06-05 DIAGNOSIS — N186 End stage renal disease: Secondary | ICD-10-CM | POA: Diagnosis not present

## 2014-06-05 DIAGNOSIS — N2581 Secondary hyperparathyroidism of renal origin: Secondary | ICD-10-CM | POA: Diagnosis not present

## 2014-06-05 DIAGNOSIS — D631 Anemia in chronic kidney disease: Secondary | ICD-10-CM | POA: Diagnosis not present

## 2014-06-05 DIAGNOSIS — D509 Iron deficiency anemia, unspecified: Secondary | ICD-10-CM | POA: Diagnosis not present

## 2014-06-07 DIAGNOSIS — D631 Anemia in chronic kidney disease: Secondary | ICD-10-CM | POA: Diagnosis not present

## 2014-06-07 DIAGNOSIS — D509 Iron deficiency anemia, unspecified: Secondary | ICD-10-CM | POA: Diagnosis not present

## 2014-06-07 DIAGNOSIS — N186 End stage renal disease: Secondary | ICD-10-CM | POA: Diagnosis not present

## 2014-06-07 DIAGNOSIS — N2581 Secondary hyperparathyroidism of renal origin: Secondary | ICD-10-CM | POA: Diagnosis not present

## 2014-06-09 DIAGNOSIS — D631 Anemia in chronic kidney disease: Secondary | ICD-10-CM | POA: Diagnosis not present

## 2014-06-09 DIAGNOSIS — D509 Iron deficiency anemia, unspecified: Secondary | ICD-10-CM | POA: Diagnosis not present

## 2014-06-09 DIAGNOSIS — N186 End stage renal disease: Secondary | ICD-10-CM | POA: Diagnosis not present

## 2014-06-09 DIAGNOSIS — N2581 Secondary hyperparathyroidism of renal origin: Secondary | ICD-10-CM | POA: Diagnosis not present

## 2014-06-12 DIAGNOSIS — N186 End stage renal disease: Secondary | ICD-10-CM | POA: Diagnosis not present

## 2014-06-12 DIAGNOSIS — D631 Anemia in chronic kidney disease: Secondary | ICD-10-CM | POA: Diagnosis not present

## 2014-06-12 DIAGNOSIS — N2581 Secondary hyperparathyroidism of renal origin: Secondary | ICD-10-CM | POA: Diagnosis not present

## 2014-06-12 DIAGNOSIS — D509 Iron deficiency anemia, unspecified: Secondary | ICD-10-CM | POA: Diagnosis not present

## 2014-06-14 DIAGNOSIS — N2581 Secondary hyperparathyroidism of renal origin: Secondary | ICD-10-CM | POA: Diagnosis not present

## 2014-06-14 DIAGNOSIS — D631 Anemia in chronic kidney disease: Secondary | ICD-10-CM | POA: Diagnosis not present

## 2014-06-14 DIAGNOSIS — D509 Iron deficiency anemia, unspecified: Secondary | ICD-10-CM | POA: Diagnosis not present

## 2014-06-14 DIAGNOSIS — N186 End stage renal disease: Secondary | ICD-10-CM | POA: Diagnosis not present

## 2014-06-16 DIAGNOSIS — N2581 Secondary hyperparathyroidism of renal origin: Secondary | ICD-10-CM | POA: Diagnosis not present

## 2014-06-16 DIAGNOSIS — N186 End stage renal disease: Secondary | ICD-10-CM | POA: Diagnosis not present

## 2014-06-16 DIAGNOSIS — D631 Anemia in chronic kidney disease: Secondary | ICD-10-CM | POA: Diagnosis not present

## 2014-06-16 DIAGNOSIS — D509 Iron deficiency anemia, unspecified: Secondary | ICD-10-CM | POA: Diagnosis not present

## 2014-06-19 DIAGNOSIS — D631 Anemia in chronic kidney disease: Secondary | ICD-10-CM | POA: Diagnosis not present

## 2014-06-19 DIAGNOSIS — D509 Iron deficiency anemia, unspecified: Secondary | ICD-10-CM | POA: Diagnosis not present

## 2014-06-19 DIAGNOSIS — N186 End stage renal disease: Secondary | ICD-10-CM | POA: Diagnosis not present

## 2014-06-19 DIAGNOSIS — N2581 Secondary hyperparathyroidism of renal origin: Secondary | ICD-10-CM | POA: Diagnosis not present

## 2014-06-21 DIAGNOSIS — N2581 Secondary hyperparathyroidism of renal origin: Secondary | ICD-10-CM | POA: Diagnosis not present

## 2014-06-21 DIAGNOSIS — D631 Anemia in chronic kidney disease: Secondary | ICD-10-CM | POA: Diagnosis not present

## 2014-06-21 DIAGNOSIS — N186 End stage renal disease: Secondary | ICD-10-CM | POA: Diagnosis not present

## 2014-06-21 DIAGNOSIS — D509 Iron deficiency anemia, unspecified: Secondary | ICD-10-CM | POA: Diagnosis not present

## 2014-06-26 DIAGNOSIS — N186 End stage renal disease: Secondary | ICD-10-CM | POA: Diagnosis not present

## 2014-06-26 DIAGNOSIS — D631 Anemia in chronic kidney disease: Secondary | ICD-10-CM | POA: Diagnosis not present

## 2014-06-26 DIAGNOSIS — N2581 Secondary hyperparathyroidism of renal origin: Secondary | ICD-10-CM | POA: Diagnosis not present

## 2014-06-26 DIAGNOSIS — D509 Iron deficiency anemia, unspecified: Secondary | ICD-10-CM | POA: Diagnosis not present

## 2014-06-28 DIAGNOSIS — D509 Iron deficiency anemia, unspecified: Secondary | ICD-10-CM | POA: Diagnosis not present

## 2014-06-28 DIAGNOSIS — D631 Anemia in chronic kidney disease: Secondary | ICD-10-CM | POA: Diagnosis not present

## 2014-06-28 DIAGNOSIS — N186 End stage renal disease: Secondary | ICD-10-CM | POA: Diagnosis not present

## 2014-06-28 DIAGNOSIS — N2581 Secondary hyperparathyroidism of renal origin: Secondary | ICD-10-CM | POA: Diagnosis not present

## 2014-06-30 DIAGNOSIS — D631 Anemia in chronic kidney disease: Secondary | ICD-10-CM | POA: Diagnosis not present

## 2014-06-30 DIAGNOSIS — N2581 Secondary hyperparathyroidism of renal origin: Secondary | ICD-10-CM | POA: Diagnosis not present

## 2014-06-30 DIAGNOSIS — N186 End stage renal disease: Secondary | ICD-10-CM | POA: Diagnosis not present

## 2014-06-30 DIAGNOSIS — D509 Iron deficiency anemia, unspecified: Secondary | ICD-10-CM | POA: Diagnosis not present

## 2014-07-01 DIAGNOSIS — Z992 Dependence on renal dialysis: Secondary | ICD-10-CM | POA: Diagnosis not present

## 2014-07-01 DIAGNOSIS — N186 End stage renal disease: Secondary | ICD-10-CM | POA: Diagnosis not present

## 2014-07-02 DIAGNOSIS — I739 Peripheral vascular disease, unspecified: Secondary | ICD-10-CM | POA: Diagnosis not present

## 2014-07-02 DIAGNOSIS — L603 Nail dystrophy: Secondary | ICD-10-CM | POA: Diagnosis not present

## 2014-07-02 DIAGNOSIS — L84 Corns and callosities: Secondary | ICD-10-CM | POA: Diagnosis not present

## 2014-07-02 DIAGNOSIS — E1151 Type 2 diabetes mellitus with diabetic peripheral angiopathy without gangrene: Secondary | ICD-10-CM | POA: Diagnosis not present

## 2014-07-03 DIAGNOSIS — N2581 Secondary hyperparathyroidism of renal origin: Secondary | ICD-10-CM | POA: Diagnosis not present

## 2014-07-03 DIAGNOSIS — D509 Iron deficiency anemia, unspecified: Secondary | ICD-10-CM | POA: Diagnosis not present

## 2014-07-03 DIAGNOSIS — D631 Anemia in chronic kidney disease: Secondary | ICD-10-CM | POA: Diagnosis not present

## 2014-07-03 DIAGNOSIS — N186 End stage renal disease: Secondary | ICD-10-CM | POA: Diagnosis not present

## 2014-07-05 DIAGNOSIS — D631 Anemia in chronic kidney disease: Secondary | ICD-10-CM | POA: Diagnosis not present

## 2014-07-05 DIAGNOSIS — D509 Iron deficiency anemia, unspecified: Secondary | ICD-10-CM | POA: Diagnosis not present

## 2014-07-05 DIAGNOSIS — N186 End stage renal disease: Secondary | ICD-10-CM | POA: Diagnosis not present

## 2014-07-05 DIAGNOSIS — N2581 Secondary hyperparathyroidism of renal origin: Secondary | ICD-10-CM | POA: Diagnosis not present

## 2014-07-07 DIAGNOSIS — D509 Iron deficiency anemia, unspecified: Secondary | ICD-10-CM | POA: Diagnosis not present

## 2014-07-07 DIAGNOSIS — D631 Anemia in chronic kidney disease: Secondary | ICD-10-CM | POA: Diagnosis not present

## 2014-07-07 DIAGNOSIS — N2581 Secondary hyperparathyroidism of renal origin: Secondary | ICD-10-CM | POA: Diagnosis not present

## 2014-07-07 DIAGNOSIS — N186 End stage renal disease: Secondary | ICD-10-CM | POA: Diagnosis not present

## 2014-07-10 DIAGNOSIS — D631 Anemia in chronic kidney disease: Secondary | ICD-10-CM | POA: Diagnosis not present

## 2014-07-10 DIAGNOSIS — N2581 Secondary hyperparathyroidism of renal origin: Secondary | ICD-10-CM | POA: Diagnosis not present

## 2014-07-10 DIAGNOSIS — D509 Iron deficiency anemia, unspecified: Secondary | ICD-10-CM | POA: Diagnosis not present

## 2014-07-10 DIAGNOSIS — N186 End stage renal disease: Secondary | ICD-10-CM | POA: Diagnosis not present

## 2014-07-12 DIAGNOSIS — N186 End stage renal disease: Secondary | ICD-10-CM | POA: Diagnosis not present

## 2014-07-12 DIAGNOSIS — D631 Anemia in chronic kidney disease: Secondary | ICD-10-CM | POA: Diagnosis not present

## 2014-07-12 DIAGNOSIS — D509 Iron deficiency anemia, unspecified: Secondary | ICD-10-CM | POA: Diagnosis not present

## 2014-07-12 DIAGNOSIS — N2581 Secondary hyperparathyroidism of renal origin: Secondary | ICD-10-CM | POA: Diagnosis not present

## 2014-07-14 DIAGNOSIS — N2581 Secondary hyperparathyroidism of renal origin: Secondary | ICD-10-CM | POA: Diagnosis not present

## 2014-07-14 DIAGNOSIS — N186 End stage renal disease: Secondary | ICD-10-CM | POA: Diagnosis not present

## 2014-07-14 DIAGNOSIS — D509 Iron deficiency anemia, unspecified: Secondary | ICD-10-CM | POA: Diagnosis not present

## 2014-07-14 DIAGNOSIS — D631 Anemia in chronic kidney disease: Secondary | ICD-10-CM | POA: Diagnosis not present

## 2014-07-16 DIAGNOSIS — L720 Epidermal cyst: Secondary | ICD-10-CM | POA: Diagnosis not present

## 2014-07-17 DIAGNOSIS — N2581 Secondary hyperparathyroidism of renal origin: Secondary | ICD-10-CM | POA: Diagnosis not present

## 2014-07-17 DIAGNOSIS — N186 End stage renal disease: Secondary | ICD-10-CM | POA: Diagnosis not present

## 2014-07-17 DIAGNOSIS — D631 Anemia in chronic kidney disease: Secondary | ICD-10-CM | POA: Diagnosis not present

## 2014-07-17 DIAGNOSIS — D509 Iron deficiency anemia, unspecified: Secondary | ICD-10-CM | POA: Diagnosis not present

## 2014-07-19 DIAGNOSIS — N2581 Secondary hyperparathyroidism of renal origin: Secondary | ICD-10-CM | POA: Diagnosis not present

## 2014-07-19 DIAGNOSIS — N186 End stage renal disease: Secondary | ICD-10-CM | POA: Diagnosis not present

## 2014-07-19 DIAGNOSIS — D509 Iron deficiency anemia, unspecified: Secondary | ICD-10-CM | POA: Diagnosis not present

## 2014-07-19 DIAGNOSIS — D631 Anemia in chronic kidney disease: Secondary | ICD-10-CM | POA: Diagnosis not present

## 2014-07-21 DIAGNOSIS — D631 Anemia in chronic kidney disease: Secondary | ICD-10-CM | POA: Diagnosis not present

## 2014-07-21 DIAGNOSIS — N2581 Secondary hyperparathyroidism of renal origin: Secondary | ICD-10-CM | POA: Diagnosis not present

## 2014-07-21 DIAGNOSIS — N186 End stage renal disease: Secondary | ICD-10-CM | POA: Diagnosis not present

## 2014-07-21 DIAGNOSIS — D509 Iron deficiency anemia, unspecified: Secondary | ICD-10-CM | POA: Diagnosis not present

## 2014-07-24 DIAGNOSIS — N2581 Secondary hyperparathyroidism of renal origin: Secondary | ICD-10-CM | POA: Diagnosis not present

## 2014-07-24 DIAGNOSIS — N186 End stage renal disease: Secondary | ICD-10-CM | POA: Diagnosis not present

## 2014-07-24 DIAGNOSIS — D631 Anemia in chronic kidney disease: Secondary | ICD-10-CM | POA: Diagnosis not present

## 2014-07-24 DIAGNOSIS — D509 Iron deficiency anemia, unspecified: Secondary | ICD-10-CM | POA: Diagnosis not present

## 2014-07-26 DIAGNOSIS — N2581 Secondary hyperparathyroidism of renal origin: Secondary | ICD-10-CM | POA: Diagnosis not present

## 2014-07-26 DIAGNOSIS — D509 Iron deficiency anemia, unspecified: Secondary | ICD-10-CM | POA: Diagnosis not present

## 2014-07-26 DIAGNOSIS — N186 End stage renal disease: Secondary | ICD-10-CM | POA: Diagnosis not present

## 2014-07-26 DIAGNOSIS — D631 Anemia in chronic kidney disease: Secondary | ICD-10-CM | POA: Diagnosis not present

## 2014-07-28 DIAGNOSIS — N186 End stage renal disease: Secondary | ICD-10-CM | POA: Diagnosis not present

## 2014-07-28 DIAGNOSIS — D509 Iron deficiency anemia, unspecified: Secondary | ICD-10-CM | POA: Diagnosis not present

## 2014-07-28 DIAGNOSIS — D631 Anemia in chronic kidney disease: Secondary | ICD-10-CM | POA: Diagnosis not present

## 2014-07-28 DIAGNOSIS — N2581 Secondary hyperparathyroidism of renal origin: Secondary | ICD-10-CM | POA: Diagnosis not present

## 2014-07-30 DIAGNOSIS — N186 End stage renal disease: Secondary | ICD-10-CM | POA: Diagnosis not present

## 2014-07-30 DIAGNOSIS — Z992 Dependence on renal dialysis: Secondary | ICD-10-CM | POA: Diagnosis not present

## 2014-07-31 DIAGNOSIS — D509 Iron deficiency anemia, unspecified: Secondary | ICD-10-CM | POA: Diagnosis not present

## 2014-07-31 DIAGNOSIS — N2581 Secondary hyperparathyroidism of renal origin: Secondary | ICD-10-CM | POA: Diagnosis not present

## 2014-07-31 DIAGNOSIS — D631 Anemia in chronic kidney disease: Secondary | ICD-10-CM | POA: Diagnosis not present

## 2014-07-31 DIAGNOSIS — N186 End stage renal disease: Secondary | ICD-10-CM | POA: Diagnosis not present

## 2014-08-02 DIAGNOSIS — D509 Iron deficiency anemia, unspecified: Secondary | ICD-10-CM | POA: Diagnosis not present

## 2014-08-02 DIAGNOSIS — N2581 Secondary hyperparathyroidism of renal origin: Secondary | ICD-10-CM | POA: Diagnosis not present

## 2014-08-02 DIAGNOSIS — N186 End stage renal disease: Secondary | ICD-10-CM | POA: Diagnosis not present

## 2014-08-02 DIAGNOSIS — D631 Anemia in chronic kidney disease: Secondary | ICD-10-CM | POA: Diagnosis not present

## 2014-08-04 DIAGNOSIS — D631 Anemia in chronic kidney disease: Secondary | ICD-10-CM | POA: Diagnosis not present

## 2014-08-04 DIAGNOSIS — N2581 Secondary hyperparathyroidism of renal origin: Secondary | ICD-10-CM | POA: Diagnosis not present

## 2014-08-04 DIAGNOSIS — N186 End stage renal disease: Secondary | ICD-10-CM | POA: Diagnosis not present

## 2014-08-04 DIAGNOSIS — D509 Iron deficiency anemia, unspecified: Secondary | ICD-10-CM | POA: Diagnosis not present

## 2014-08-07 DIAGNOSIS — D631 Anemia in chronic kidney disease: Secondary | ICD-10-CM | POA: Diagnosis not present

## 2014-08-07 DIAGNOSIS — N186 End stage renal disease: Secondary | ICD-10-CM | POA: Diagnosis not present

## 2014-08-07 DIAGNOSIS — D509 Iron deficiency anemia, unspecified: Secondary | ICD-10-CM | POA: Diagnosis not present

## 2014-08-07 DIAGNOSIS — N2581 Secondary hyperparathyroidism of renal origin: Secondary | ICD-10-CM | POA: Diagnosis not present

## 2014-08-09 DIAGNOSIS — N2581 Secondary hyperparathyroidism of renal origin: Secondary | ICD-10-CM | POA: Diagnosis not present

## 2014-08-09 DIAGNOSIS — D631 Anemia in chronic kidney disease: Secondary | ICD-10-CM | POA: Diagnosis not present

## 2014-08-09 DIAGNOSIS — D509 Iron deficiency anemia, unspecified: Secondary | ICD-10-CM | POA: Diagnosis not present

## 2014-08-09 DIAGNOSIS — N186 End stage renal disease: Secondary | ICD-10-CM | POA: Diagnosis not present

## 2014-08-10 DIAGNOSIS — T859XXA Unspecified complication of internal prosthetic device, implant and graft, initial encounter: Secondary | ICD-10-CM | POA: Diagnosis not present

## 2014-08-10 DIAGNOSIS — N186 End stage renal disease: Secondary | ICD-10-CM | POA: Diagnosis not present

## 2014-08-11 DIAGNOSIS — N186 End stage renal disease: Secondary | ICD-10-CM | POA: Diagnosis not present

## 2014-08-11 DIAGNOSIS — D509 Iron deficiency anemia, unspecified: Secondary | ICD-10-CM | POA: Diagnosis not present

## 2014-08-11 DIAGNOSIS — D631 Anemia in chronic kidney disease: Secondary | ICD-10-CM | POA: Diagnosis not present

## 2014-08-11 DIAGNOSIS — N2581 Secondary hyperparathyroidism of renal origin: Secondary | ICD-10-CM | POA: Diagnosis not present

## 2014-08-13 ENCOUNTER — Ambulatory Visit: Payer: Self-pay | Admitting: Vascular Surgery

## 2014-08-13 DIAGNOSIS — T888XXD Other specified complications of surgical and medical care, not elsewhere classified, subsequent encounter: Secondary | ICD-10-CM | POA: Diagnosis not present

## 2014-08-13 DIAGNOSIS — T82898A Other specified complication of vascular prosthetic devices, implants and grafts, initial encounter: Secondary | ICD-10-CM | POA: Diagnosis not present

## 2014-08-13 DIAGNOSIS — E785 Hyperlipidemia, unspecified: Secondary | ICD-10-CM | POA: Diagnosis not present

## 2014-08-13 DIAGNOSIS — Z7982 Long term (current) use of aspirin: Secondary | ICD-10-CM | POA: Diagnosis not present

## 2014-08-13 DIAGNOSIS — N186 End stage renal disease: Secondary | ICD-10-CM | POA: Diagnosis not present

## 2014-08-13 DIAGNOSIS — Z79899 Other long term (current) drug therapy: Secondary | ICD-10-CM | POA: Diagnosis not present

## 2014-08-13 DIAGNOSIS — Z95828 Presence of other vascular implants and grafts: Secondary | ICD-10-CM | POA: Diagnosis not present

## 2014-08-13 DIAGNOSIS — I120 Hypertensive chronic kidney disease with stage 5 chronic kidney disease or end stage renal disease: Secondary | ICD-10-CM | POA: Diagnosis not present

## 2014-08-13 DIAGNOSIS — E119 Type 2 diabetes mellitus without complications: Secondary | ICD-10-CM | POA: Diagnosis not present

## 2014-08-13 DIAGNOSIS — Z992 Dependence on renal dialysis: Secondary | ICD-10-CM | POA: Diagnosis not present

## 2014-08-13 DIAGNOSIS — T8241XS Breakdown (mechanical) of vascular dialysis catheter, sequela: Secondary | ICD-10-CM | POA: Diagnosis not present

## 2014-08-14 DIAGNOSIS — D509 Iron deficiency anemia, unspecified: Secondary | ICD-10-CM | POA: Diagnosis not present

## 2014-08-14 DIAGNOSIS — N186 End stage renal disease: Secondary | ICD-10-CM | POA: Diagnosis not present

## 2014-08-14 DIAGNOSIS — D631 Anemia in chronic kidney disease: Secondary | ICD-10-CM | POA: Diagnosis not present

## 2014-08-14 DIAGNOSIS — N2581 Secondary hyperparathyroidism of renal origin: Secondary | ICD-10-CM | POA: Diagnosis not present

## 2014-08-16 DIAGNOSIS — D509 Iron deficiency anemia, unspecified: Secondary | ICD-10-CM | POA: Diagnosis not present

## 2014-08-16 DIAGNOSIS — N2581 Secondary hyperparathyroidism of renal origin: Secondary | ICD-10-CM | POA: Diagnosis not present

## 2014-08-16 DIAGNOSIS — N186 End stage renal disease: Secondary | ICD-10-CM | POA: Diagnosis not present

## 2014-08-16 DIAGNOSIS — D631 Anemia in chronic kidney disease: Secondary | ICD-10-CM | POA: Diagnosis not present

## 2014-08-18 DIAGNOSIS — N2581 Secondary hyperparathyroidism of renal origin: Secondary | ICD-10-CM | POA: Diagnosis not present

## 2014-08-18 DIAGNOSIS — D631 Anemia in chronic kidney disease: Secondary | ICD-10-CM | POA: Diagnosis not present

## 2014-08-18 DIAGNOSIS — D509 Iron deficiency anemia, unspecified: Secondary | ICD-10-CM | POA: Diagnosis not present

## 2014-08-18 DIAGNOSIS — N186 End stage renal disease: Secondary | ICD-10-CM | POA: Diagnosis not present

## 2014-08-21 DIAGNOSIS — N186 End stage renal disease: Secondary | ICD-10-CM | POA: Diagnosis not present

## 2014-08-21 DIAGNOSIS — D631 Anemia in chronic kidney disease: Secondary | ICD-10-CM | POA: Diagnosis not present

## 2014-08-21 DIAGNOSIS — D509 Iron deficiency anemia, unspecified: Secondary | ICD-10-CM | POA: Diagnosis not present

## 2014-08-21 DIAGNOSIS — N2581 Secondary hyperparathyroidism of renal origin: Secondary | ICD-10-CM | POA: Diagnosis not present

## 2014-08-23 DIAGNOSIS — D509 Iron deficiency anemia, unspecified: Secondary | ICD-10-CM | POA: Diagnosis not present

## 2014-08-23 DIAGNOSIS — N2581 Secondary hyperparathyroidism of renal origin: Secondary | ICD-10-CM | POA: Diagnosis not present

## 2014-08-23 DIAGNOSIS — N186 End stage renal disease: Secondary | ICD-10-CM | POA: Diagnosis not present

## 2014-08-23 DIAGNOSIS — D631 Anemia in chronic kidney disease: Secondary | ICD-10-CM | POA: Diagnosis not present

## 2014-08-25 DIAGNOSIS — D631 Anemia in chronic kidney disease: Secondary | ICD-10-CM | POA: Diagnosis not present

## 2014-08-25 DIAGNOSIS — N186 End stage renal disease: Secondary | ICD-10-CM | POA: Diagnosis not present

## 2014-08-25 DIAGNOSIS — D509 Iron deficiency anemia, unspecified: Secondary | ICD-10-CM | POA: Diagnosis not present

## 2014-08-25 DIAGNOSIS — N2581 Secondary hyperparathyroidism of renal origin: Secondary | ICD-10-CM | POA: Diagnosis not present

## 2014-08-28 DIAGNOSIS — N186 End stage renal disease: Secondary | ICD-10-CM | POA: Diagnosis not present

## 2014-08-28 DIAGNOSIS — N2581 Secondary hyperparathyroidism of renal origin: Secondary | ICD-10-CM | POA: Diagnosis not present

## 2014-08-28 DIAGNOSIS — D509 Iron deficiency anemia, unspecified: Secondary | ICD-10-CM | POA: Diagnosis not present

## 2014-08-28 DIAGNOSIS — D631 Anemia in chronic kidney disease: Secondary | ICD-10-CM | POA: Diagnosis not present

## 2014-08-30 DIAGNOSIS — D509 Iron deficiency anemia, unspecified: Secondary | ICD-10-CM | POA: Diagnosis not present

## 2014-08-30 DIAGNOSIS — E1129 Type 2 diabetes mellitus with other diabetic kidney complication: Secondary | ICD-10-CM | POA: Diagnosis not present

## 2014-08-30 DIAGNOSIS — D631 Anemia in chronic kidney disease: Secondary | ICD-10-CM | POA: Diagnosis not present

## 2014-08-30 DIAGNOSIS — N186 End stage renal disease: Secondary | ICD-10-CM | POA: Diagnosis not present

## 2014-08-30 DIAGNOSIS — Z992 Dependence on renal dialysis: Secondary | ICD-10-CM | POA: Diagnosis not present

## 2014-08-30 DIAGNOSIS — N2581 Secondary hyperparathyroidism of renal origin: Secondary | ICD-10-CM | POA: Diagnosis not present

## 2014-09-01 DIAGNOSIS — D631 Anemia in chronic kidney disease: Secondary | ICD-10-CM | POA: Diagnosis not present

## 2014-09-01 DIAGNOSIS — B9689 Other specified bacterial agents as the cause of diseases classified elsewhere: Secondary | ICD-10-CM | POA: Diagnosis not present

## 2014-09-01 DIAGNOSIS — R509 Fever, unspecified: Secondary | ICD-10-CM | POA: Diagnosis not present

## 2014-09-01 DIAGNOSIS — N186 End stage renal disease: Secondary | ICD-10-CM | POA: Diagnosis not present

## 2014-09-01 DIAGNOSIS — T827XXA Infection and inflammatory reaction due to other cardiac and vascular devices, implants and grafts, initial encounter: Secondary | ICD-10-CM | POA: Diagnosis not present

## 2014-09-01 DIAGNOSIS — N2581 Secondary hyperparathyroidism of renal origin: Secondary | ICD-10-CM | POA: Diagnosis not present

## 2014-09-04 ENCOUNTER — Ambulatory Visit: Payer: Medicare Other | Admitting: Family Medicine

## 2014-09-04 DIAGNOSIS — D631 Anemia in chronic kidney disease: Secondary | ICD-10-CM | POA: Diagnosis not present

## 2014-09-04 DIAGNOSIS — Z0289 Encounter for other administrative examinations: Secondary | ICD-10-CM

## 2014-09-04 DIAGNOSIS — B9689 Other specified bacterial agents as the cause of diseases classified elsewhere: Secondary | ICD-10-CM | POA: Diagnosis not present

## 2014-09-04 DIAGNOSIS — T827XXA Infection and inflammatory reaction due to other cardiac and vascular devices, implants and grafts, initial encounter: Secondary | ICD-10-CM | POA: Diagnosis not present

## 2014-09-04 DIAGNOSIS — N2581 Secondary hyperparathyroidism of renal origin: Secondary | ICD-10-CM | POA: Diagnosis not present

## 2014-09-04 DIAGNOSIS — N186 End stage renal disease: Secondary | ICD-10-CM | POA: Diagnosis not present

## 2014-09-04 DIAGNOSIS — R509 Fever, unspecified: Secondary | ICD-10-CM | POA: Diagnosis not present

## 2014-09-06 DIAGNOSIS — N186 End stage renal disease: Secondary | ICD-10-CM | POA: Diagnosis not present

## 2014-09-06 DIAGNOSIS — B9689 Other specified bacterial agents as the cause of diseases classified elsewhere: Secondary | ICD-10-CM | POA: Diagnosis not present

## 2014-09-06 DIAGNOSIS — R509 Fever, unspecified: Secondary | ICD-10-CM | POA: Diagnosis not present

## 2014-09-06 DIAGNOSIS — T827XXA Infection and inflammatory reaction due to other cardiac and vascular devices, implants and grafts, initial encounter: Secondary | ICD-10-CM | POA: Diagnosis not present

## 2014-09-06 DIAGNOSIS — N2581 Secondary hyperparathyroidism of renal origin: Secondary | ICD-10-CM | POA: Diagnosis not present

## 2014-09-06 DIAGNOSIS — D631 Anemia in chronic kidney disease: Secondary | ICD-10-CM | POA: Diagnosis not present

## 2014-09-08 DIAGNOSIS — N2581 Secondary hyperparathyroidism of renal origin: Secondary | ICD-10-CM | POA: Diagnosis not present

## 2014-09-08 DIAGNOSIS — T827XXA Infection and inflammatory reaction due to other cardiac and vascular devices, implants and grafts, initial encounter: Secondary | ICD-10-CM | POA: Diagnosis not present

## 2014-09-08 DIAGNOSIS — N186 End stage renal disease: Secondary | ICD-10-CM | POA: Diagnosis not present

## 2014-09-08 DIAGNOSIS — B9689 Other specified bacterial agents as the cause of diseases classified elsewhere: Secondary | ICD-10-CM | POA: Diagnosis not present

## 2014-09-08 DIAGNOSIS — R509 Fever, unspecified: Secondary | ICD-10-CM | POA: Diagnosis not present

## 2014-09-08 DIAGNOSIS — D631 Anemia in chronic kidney disease: Secondary | ICD-10-CM | POA: Diagnosis not present

## 2014-09-11 DIAGNOSIS — N2581 Secondary hyperparathyroidism of renal origin: Secondary | ICD-10-CM | POA: Diagnosis not present

## 2014-09-11 DIAGNOSIS — B9689 Other specified bacterial agents as the cause of diseases classified elsewhere: Secondary | ICD-10-CM | POA: Diagnosis not present

## 2014-09-11 DIAGNOSIS — T827XXA Infection and inflammatory reaction due to other cardiac and vascular devices, implants and grafts, initial encounter: Secondary | ICD-10-CM | POA: Diagnosis not present

## 2014-09-11 DIAGNOSIS — D631 Anemia in chronic kidney disease: Secondary | ICD-10-CM | POA: Diagnosis not present

## 2014-09-11 DIAGNOSIS — N186 End stage renal disease: Secondary | ICD-10-CM | POA: Diagnosis not present

## 2014-09-11 DIAGNOSIS — R509 Fever, unspecified: Secondary | ICD-10-CM | POA: Diagnosis not present

## 2014-09-12 DIAGNOSIS — N186 End stage renal disease: Secondary | ICD-10-CM | POA: Diagnosis not present

## 2014-09-12 DIAGNOSIS — Z95828 Presence of other vascular implants and grafts: Secondary | ICD-10-CM | POA: Diagnosis not present

## 2014-09-12 DIAGNOSIS — Z992 Dependence on renal dialysis: Secondary | ICD-10-CM | POA: Diagnosis not present

## 2014-09-12 DIAGNOSIS — I120 Hypertensive chronic kidney disease with stage 5 chronic kidney disease or end stage renal disease: Secondary | ICD-10-CM | POA: Diagnosis not present

## 2014-09-12 DIAGNOSIS — T8241XS Breakdown (mechanical) of vascular dialysis catheter, sequela: Secondary | ICD-10-CM | POA: Diagnosis not present

## 2014-09-12 DIAGNOSIS — I10 Essential (primary) hypertension: Secondary | ICD-10-CM | POA: Diagnosis not present

## 2014-09-12 DIAGNOSIS — T888XXD Other specified complications of surgical and medical care, not elsewhere classified, subsequent encounter: Secondary | ICD-10-CM | POA: Diagnosis not present

## 2014-09-12 DIAGNOSIS — Y841 Kidney dialysis as the cause of abnormal reaction of the patient, or of later complication, without mention of misadventure at the time of the procedure: Secondary | ICD-10-CM | POA: Diagnosis not present

## 2014-09-12 DIAGNOSIS — E785 Hyperlipidemia, unspecified: Secondary | ICD-10-CM | POA: Diagnosis not present

## 2014-09-12 DIAGNOSIS — Z7982 Long term (current) use of aspirin: Secondary | ICD-10-CM | POA: Diagnosis not present

## 2014-09-12 DIAGNOSIS — E119 Type 2 diabetes mellitus without complications: Secondary | ICD-10-CM | POA: Diagnosis not present

## 2014-09-13 ENCOUNTER — Ambulatory Visit
Admit: 2014-09-13 | Disposition: A | Discharge status: Home / Self Care | Payer: Self-pay | Attending: Vascular Surgery | Admitting: Vascular Surgery

## 2014-09-13 DIAGNOSIS — I10 Essential (primary) hypertension: Secondary | ICD-10-CM | POA: Diagnosis not present

## 2014-09-13 DIAGNOSIS — N186 End stage renal disease: Secondary | ICD-10-CM | POA: Diagnosis not present

## 2014-09-13 DIAGNOSIS — I871 Compression of vein: Secondary | ICD-10-CM | POA: Diagnosis not present

## 2014-09-13 DIAGNOSIS — E119 Type 2 diabetes mellitus without complications: Secondary | ICD-10-CM | POA: Diagnosis not present

## 2014-09-13 DIAGNOSIS — Z992 Dependence on renal dialysis: Secondary | ICD-10-CM | POA: Diagnosis not present

## 2014-09-13 DIAGNOSIS — Z79899 Other long term (current) drug therapy: Secondary | ICD-10-CM | POA: Diagnosis not present

## 2014-09-13 DIAGNOSIS — I120 Hypertensive chronic kidney disease with stage 5 chronic kidney disease or end stage renal disease: Secondary | ICD-10-CM | POA: Diagnosis not present

## 2014-09-13 DIAGNOSIS — Z95828 Presence of other vascular implants and grafts: Secondary | ICD-10-CM | POA: Diagnosis not present

## 2014-09-13 DIAGNOSIS — T827XXS Infection and inflammatory reaction due to other cardiac and vascular devices, implants and grafts, sequela: Secondary | ICD-10-CM | POA: Diagnosis not present

## 2014-09-13 DIAGNOSIS — T827XXA Infection and inflammatory reaction due to other cardiac and vascular devices, implants and grafts, initial encounter: Secondary | ICD-10-CM | POA: Diagnosis not present

## 2014-09-13 DIAGNOSIS — Z7982 Long term (current) use of aspirin: Secondary | ICD-10-CM | POA: Diagnosis not present

## 2014-09-15 DIAGNOSIS — B9689 Other specified bacterial agents as the cause of diseases classified elsewhere: Secondary | ICD-10-CM | POA: Diagnosis not present

## 2014-09-15 DIAGNOSIS — N186 End stage renal disease: Secondary | ICD-10-CM | POA: Diagnosis not present

## 2014-09-15 DIAGNOSIS — N2581 Secondary hyperparathyroidism of renal origin: Secondary | ICD-10-CM | POA: Diagnosis not present

## 2014-09-15 DIAGNOSIS — T827XXA Infection and inflammatory reaction due to other cardiac and vascular devices, implants and grafts, initial encounter: Secondary | ICD-10-CM | POA: Diagnosis not present

## 2014-09-15 DIAGNOSIS — R509 Fever, unspecified: Secondary | ICD-10-CM | POA: Diagnosis not present

## 2014-09-15 DIAGNOSIS — D631 Anemia in chronic kidney disease: Secondary | ICD-10-CM | POA: Diagnosis not present

## 2014-09-17 DIAGNOSIS — H4011X3 Primary open-angle glaucoma, severe stage: Secondary | ICD-10-CM | POA: Diagnosis not present

## 2014-09-17 DIAGNOSIS — H3531 Nonexudative age-related macular degeneration: Secondary | ICD-10-CM | POA: Diagnosis not present

## 2014-09-17 DIAGNOSIS — E11359 Type 2 diabetes mellitus with proliferative diabetic retinopathy without macular edema: Secondary | ICD-10-CM | POA: Diagnosis not present

## 2014-09-18 DIAGNOSIS — D631 Anemia in chronic kidney disease: Secondary | ICD-10-CM | POA: Diagnosis not present

## 2014-09-18 DIAGNOSIS — Z5181 Encounter for therapeutic drug level monitoring: Secondary | ICD-10-CM | POA: Diagnosis not present

## 2014-09-18 DIAGNOSIS — N186 End stage renal disease: Secondary | ICD-10-CM | POA: Diagnosis not present

## 2014-09-18 DIAGNOSIS — R509 Fever, unspecified: Secondary | ICD-10-CM | POA: Diagnosis not present

## 2014-09-18 DIAGNOSIS — B9689 Other specified bacterial agents as the cause of diseases classified elsewhere: Secondary | ICD-10-CM | POA: Diagnosis not present

## 2014-09-18 DIAGNOSIS — N2581 Secondary hyperparathyroidism of renal origin: Secondary | ICD-10-CM | POA: Diagnosis not present

## 2014-09-18 DIAGNOSIS — T827XXA Infection and inflammatory reaction due to other cardiac and vascular devices, implants and grafts, initial encounter: Secondary | ICD-10-CM | POA: Diagnosis not present

## 2014-09-20 DIAGNOSIS — N186 End stage renal disease: Secondary | ICD-10-CM | POA: Diagnosis not present

## 2014-09-20 DIAGNOSIS — N2581 Secondary hyperparathyroidism of renal origin: Secondary | ICD-10-CM | POA: Diagnosis not present

## 2014-09-20 DIAGNOSIS — D631 Anemia in chronic kidney disease: Secondary | ICD-10-CM | POA: Diagnosis not present

## 2014-09-20 DIAGNOSIS — T827XXA Infection and inflammatory reaction due to other cardiac and vascular devices, implants and grafts, initial encounter: Secondary | ICD-10-CM | POA: Diagnosis not present

## 2014-09-20 DIAGNOSIS — R509 Fever, unspecified: Secondary | ICD-10-CM | POA: Diagnosis not present

## 2014-09-20 DIAGNOSIS — B9689 Other specified bacterial agents as the cause of diseases classified elsewhere: Secondary | ICD-10-CM | POA: Diagnosis not present

## 2014-09-21 NOTE — Op Note (Signed)
PATIENT NAME:  [name], Hoda MR#:  [id number] DATE OF BIRTH:  Nov 25, 1928  DATE OF PROCEDURE:  10/10/2012  PREOPERATIVE DIAGNOSES: 1.  End-stage renal disease.  2.  Diminished access flow, right arm HeRO graft.  3.  Diabetes.   POSTOPERATIVE DIAGNOSES: 1.  End-stage renal disease.  2.  Diminished access flow, right arm HeRO graft.  3.  Diabetes.   PROCEDURES: 1.  Ultrasound guidance for vascular access, right arm HeRO graft.  2.  Right upper extremity shuntogram and central venogram.   SURGEON: Annice NeedyJason S. [name], M.D.   ANESTHESIA: Local with moderate conscious sedation.   ESTIMATED BLOOD LOSS: 25 mL.   FLUOROSCOPY TIME: Approximately 1 minutes.   CONTRAST USED: 20 mL contrast was used.   INDICATION FOR PROCEDURE: An 81 year old African American female with end-stage renal disease. We were asked to evaluate her right arm HeRO graft by her dialysis access center due to decreased access flows.   DESCRIPTION OF PROCEDURE: The patient was brought to the vascular interventional radiology suite. The right upper extremity was sterilely prepped and draped, and a sterile surgical field was created. The HeRO graft was visualized with ultrasound and accessed in a retrograde fashion just beyond a previously placed stent in the proximal midportion of the PTFE portion of the HeRO graft.  Micropuncture wire and sheath were placed, and ultimately we exchanged for a 6-French sheath and put a Kumpe catheter at the arterial anastomosis. Imaging demonstrated no greater than 20% stenosis within the HeRO graft and the previously placed stent. There was brisk flow through the HeRO graft. The silicone portion centrally was patent and this had good outflow in the right atrium with good pulmonary outflow. There was no role for interventions, so at this point we elected to terminate the procedure. The sheath was removed around a 4-0 Monocryl purse suture. Pressure was held. Sterile dressing was placed. The patient  tolerated the procedure well and was taken to the recovery room in stable condition.      ____________________________ Annice NeedyJason S. [name], MD jsd:dmm D: 10/10/2012 13:33:55 ET T: 10/10/2012 21:59:49 ET JOB#: [id number]  cc: Annice NeedyJason S. [name], MD, <Dictator> Annice NeedyJASON S [name] MD ELECTRONICALLY SIGNED 10/17/2012 13:55

## 2014-09-21 NOTE — Op Note (Signed)
PATIENT NAME:  Meta HatchetGATSON, Patrica MR#:  [id number] DATE OF BIRTH:  05/31/1929  DATE OF PROCEDURE:  03/14/2013  PREOPERATIVE DIAGNOSIS: Thrombosis of right arm HeRO graft.   POSTOPERATIVE DIAGNOSIS: Thrombosis of right arm HeRO graft.   PROCEDURES PERFORMED:  1.  Contrast injection, right arm HeRO graft.  2.  Mechanical thrombectomy, right arm HeRO graft.  3.  Percutaneous transluminal angioplasty in the midportion of the graft to 8 mm.   SURGEON: Renford DillsGregory G Hopelynn Gartland, M.D.   SEDATION: Versed 3 mg plus fentanyl 100 mcg administered IV. Continuous ECG, pulse oximetry and cardiopulmonary monitoring is performed throughout the entire procedure by the interventional radiology nurse. Total sedation time is 1 hour.   ACCESS:  1.  A 6-French sheath, antegrade direction, right arm HeRO graft.  2.  A 6-French sheath, retrograde direction, right arm HeRO graft.   CONTRAST USED: Isovue 25 mL.   FLUOROSCOPY TIME: 3.1 minutes.   INDICATIONS: The patient presented to dialysis and was found to have thrombosis of her HeRO graft. She is sent over for salvage.   Risks and benefits were reviewed. All questions answered. The patient and daughter are present. All are in agreement with proceeding.   DESCRIPTION OF PROCEDURE: The patient is taken to special procedures and placed in the supine position. After adequate sedation is achieved, she is positioned supine and her right arm is extended palm up. Her right arm is prepped and draped in sterile fashion.   Then 1% lidocaine was infiltrated into soft tissues and access to the HeRO graft is obtained in an antegrade direction just above the antecubital fossa. Microwire followed by microsheath, J-wire followed by a 6-French sheath. Hand injection of contrast confirms thrombus. The HeRO graft is then scanned under fluoroscopy and found to have the tip properly positioned in the atrium and free of kinks. There is no evidence of disruption or kinking.   The Trerotola  device is then openned on the field, 4000 units of heparin is given, and the Trerotola device is then advanced through the 6-French sheath out into the atrium where the basket is opened. It is then pulled back through the silicon portion. Once it is in the PTFE portion, it is engaged. Multiple passes are made. Follow-up angiography demonstrates resolution of thrombus within the visualized portion.   Then 1% lidocaine is infiltrated in the soft tissues overlying the graft at the level of the deltoid, and a retrograde sheath is inserted in a similar fashion as described above. Trerotola device is then advanced through the sheath out into the brachial artery, where again the basket is opened. It is then slowly pulled back but not engaged until it was within the PTFE portion. Multiple passes are then made within the arterial portion. Another pass is made through the venous and hand injection of contrast is demonstrated. It is utilized to demonstrate the flow in the graft. High-grade stenosis in the midportion of the graft is identified. Magic torque wire is introduced and an 8 x 4 balloon was advanced across the lesion. It is then inflated to 14 atmospheres and with the balloon inflated, reflux of contrast is utilized to demonstrate the arterial anastomosis and visualized portions of the brachial artery. Follow-up angiography is then performed of the midportion of the graft, and after review of these images, pursestring sutures are placed around the sheaths. The sheaths are removed. Light pressure is held, and there are no immediate complications.   INTERPRETATION: The initial view demonstrates thrombus throughout the HeRO graft.  Following mechanical thrombectomy, there is resolution of thrombus with flow of contrast through the graft. Midportion is then angioplastied to 8 mm with an excellent result and less than 5% residual stenosis. Visualized portions of the brachial artery are also widely patent as is the  arterial anastomosis.   SUMMARY: Successful salvage of right arm HeRO graft as described above.     ____________________________ Renford Dills, MD ggs:np D: 03/15/2013 10:30:10 ET T: 03/15/2013 11:04:17 ET JOB#: [id number]  cc: Renford Dills, MD, <Dictator> Renford Dills MD ELECTRONICALLY SIGNED 04/07/2013 8:13

## 2014-09-22 DIAGNOSIS — R509 Fever, unspecified: Secondary | ICD-10-CM | POA: Diagnosis not present

## 2014-09-22 DIAGNOSIS — N186 End stage renal disease: Secondary | ICD-10-CM | POA: Diagnosis not present

## 2014-09-22 DIAGNOSIS — B9689 Other specified bacterial agents as the cause of diseases classified elsewhere: Secondary | ICD-10-CM | POA: Diagnosis not present

## 2014-09-22 DIAGNOSIS — T827XXA Infection and inflammatory reaction due to other cardiac and vascular devices, implants and grafts, initial encounter: Secondary | ICD-10-CM | POA: Diagnosis not present

## 2014-09-22 DIAGNOSIS — N2581 Secondary hyperparathyroidism of renal origin: Secondary | ICD-10-CM | POA: Diagnosis not present

## 2014-09-22 DIAGNOSIS — D631 Anemia in chronic kidney disease: Secondary | ICD-10-CM | POA: Diagnosis not present

## 2014-09-25 DIAGNOSIS — T827XXA Infection and inflammatory reaction due to other cardiac and vascular devices, implants and grafts, initial encounter: Secondary | ICD-10-CM | POA: Diagnosis not present

## 2014-09-25 DIAGNOSIS — N186 End stage renal disease: Secondary | ICD-10-CM | POA: Diagnosis not present

## 2014-09-25 DIAGNOSIS — R509 Fever, unspecified: Secondary | ICD-10-CM | POA: Diagnosis not present

## 2014-09-25 DIAGNOSIS — D631 Anemia in chronic kidney disease: Secondary | ICD-10-CM | POA: Diagnosis not present

## 2014-09-25 DIAGNOSIS — Z5181 Encounter for therapeutic drug level monitoring: Secondary | ICD-10-CM | POA: Diagnosis not present

## 2014-09-25 DIAGNOSIS — N2581 Secondary hyperparathyroidism of renal origin: Secondary | ICD-10-CM | POA: Diagnosis not present

## 2014-09-25 DIAGNOSIS — B9689 Other specified bacterial agents as the cause of diseases classified elsewhere: Secondary | ICD-10-CM | POA: Diagnosis not present

## 2014-09-27 DIAGNOSIS — N2581 Secondary hyperparathyroidism of renal origin: Secondary | ICD-10-CM | POA: Diagnosis not present

## 2014-09-27 DIAGNOSIS — B9689 Other specified bacterial agents as the cause of diseases classified elsewhere: Secondary | ICD-10-CM | POA: Diagnosis not present

## 2014-09-27 DIAGNOSIS — T827XXA Infection and inflammatory reaction due to other cardiac and vascular devices, implants and grafts, initial encounter: Secondary | ICD-10-CM | POA: Diagnosis not present

## 2014-09-27 DIAGNOSIS — D631 Anemia in chronic kidney disease: Secondary | ICD-10-CM | POA: Diagnosis not present

## 2014-09-27 DIAGNOSIS — R509 Fever, unspecified: Secondary | ICD-10-CM | POA: Diagnosis not present

## 2014-09-27 DIAGNOSIS — N186 End stage renal disease: Secondary | ICD-10-CM | POA: Diagnosis not present

## 2014-09-29 DIAGNOSIS — D631 Anemia in chronic kidney disease: Secondary | ICD-10-CM | POA: Diagnosis not present

## 2014-09-29 DIAGNOSIS — T827XXA Infection and inflammatory reaction due to other cardiac and vascular devices, implants and grafts, initial encounter: Secondary | ICD-10-CM | POA: Diagnosis not present

## 2014-09-29 DIAGNOSIS — Z992 Dependence on renal dialysis: Secondary | ICD-10-CM | POA: Diagnosis not present

## 2014-09-29 DIAGNOSIS — N186 End stage renal disease: Secondary | ICD-10-CM | POA: Diagnosis not present

## 2014-09-29 DIAGNOSIS — N2581 Secondary hyperparathyroidism of renal origin: Secondary | ICD-10-CM | POA: Diagnosis not present

## 2014-09-29 DIAGNOSIS — B9689 Other specified bacterial agents as the cause of diseases classified elsewhere: Secondary | ICD-10-CM | POA: Diagnosis not present

## 2014-09-29 DIAGNOSIS — E1129 Type 2 diabetes mellitus with other diabetic kidney complication: Secondary | ICD-10-CM | POA: Diagnosis not present

## 2014-09-29 DIAGNOSIS — R509 Fever, unspecified: Secondary | ICD-10-CM | POA: Diagnosis not present

## 2014-09-30 NOTE — Op Note (Signed)
PATIENT NAME:  [name], [name] MR#:  [id number] DATE OF BIRTH:  May 13, 1929  DATE OF PROCEDURE:  09/13/2014  PREOPERATIVE DIAGNOSES:  1.  End-stage renal disease.  2.  Infection/wound right arm HeRO graft. 3.  Hypertension.   POSTOPERATIVE DIAGNOSES: 1.  End-stage renal disease.  2.  Infection/wound right arm HeRO graft. 3.  Hypertension.  4.  Central venous stenosis.  PROCEDURES:  1.  Ultrasound guidance for vascular access, left jugular vein.  2.  Catheter placement to superior vena cava from left jugular venous approach.  3.  Left jugular venogram and superior venacavogram.  4.  Percutaneous transluminal angioplasty of left innominate vein and superior vena cava with    8 mm diameter angioplasty balloon.  5.  Fluoroscopic guidance for placement of dialysis catheter.  6.  Placement of a 23 cm tip to cuff tunneled hemodialysis catheter, left jugular vein.   SURGEON:  Annice Needy, M.D.   ANESTHESIA:  Local with moderate conscious sedation.   ESTIMATED BLOOD LOSS:  Minimal.   INDICATION FOR PROCEDURE:  This is an 81 year old female with end-stage renal disease. She has developed a wound overlying her right arm HeRO graft.  There is likely some degree of underlying infection.  We are going to place a PermCath,  treat her with antibiotics, and see if this graft can be salvaged.  We will need to not use the graft for several weeks.  Risks and benefits were discussed.  Informed consent was obtained.   DESCRIPTION OF PROCEDURE:  The patient is brought to the vascular suite.  The left neck and chest area were sterilely prepped and draped and a sterile surgical field was created.  The left jugular vein of the neck was visualized and found to be widely patent.  It was accessed under direct ultrasound guidance without difficulty with a Seldinger needle and a permanent image was recorded.  A J-wire was placed but it would not cross through the innominate vein into the superior vena cava despite  multiple efforts.  I then placed a 6-French sheath and a Kumpe catheter and J-wire were used but the wire still did not traverse easily.  At this point, a venogram was performed of the left jugular vein, innominate vein, and superior vena cava.  This demonstrated stenosis at the junction of the superior vena cava and left innominate vein likely from a long-standing HeRO graft from the right side.  I was able to negotiate through this with the degree of stenosis a little difficult to discern but appeared to be in about the 80% range.  I was able to negotiate through this with the Kumpe catheter and the Glidewire through the superior vena cava through the right atrium and parked the catheter into the inferior vena cava and exchanged for a Magic Torque wire.  Over this, an 8 mm diameter angioplasty balloon was inflated.  There was significant narrowing and waste seen in the innominate vein largely.  The most central portion of the balloon went into the superior vena cava and most distal portion went back to the jugular vein, innominate vein confluence.   Completion venogram showed this to be markedly improved with only about 10% to 15% residual stenosis.  I was then able to easily pass a series of dilators and a peel-away sheath into the superior vena cava.  Using fluoroscopic guidance, I selected a 23 cm tip to cuff tunneled hemodialysis catheter and tunneled this from a subclavicular incision to the access site, placed this  through the peel-away sheath, and the peel-away sheath was removed.  The catheter tips were parked in the right atrium.    The appropriate distal connectors were placed to the two-piece PermCath and it was secured to the chest wall with 2 Prolene sutures.  It withdrew blood well and flushed easily with heparinized saline and concentrated heparin solution was placed.  A 4-0 Monocryl pursestring suture was placed around the exit site.  Another 4-0 suture was used to close the access site.   Dermabond and sterile dressings were placed.    The patient tolerated the procedure well and was taken to the recovery room in stable condition.      ____________________________ Annice NeedyJason S. Ulysses Alper, MD jsd:852 D: 09/13/2014 15:17:49 ET T: 09/13/2014 15:42:36 ET JOB#: [id number]  cc: Annice NeedyJason S. Ridhi Hoffert, MD, <Dictator> Annice NeedyJASON S Besse Miron MD ELECTRONICALLY SIGNED 09/26/2014 13:47

## 2014-09-30 NOTE — Op Note (Signed)
PATIENT NAME:  [name], [name] MR#:  [id number] DATE OF BIRTH:  1928-12-13  DATE OF PROCEDURE:  08/13/2014  PREOPERATIVE DIAGNOSES:  1.  End-stage renal disease.  2.  Poorly functioning right arm HeRO graft with difficulty with access, prolonged bleeding.  3.  Hyperlipidemia.   PROCEDURE:   1.  Ultrasound guidance for vascular access to right arm HeRO graft. 2.  Right upper extremity shuntogram and central venogram.   SURGEON: Annice NeedyJason S. [name], M.D.   ANESTHESIA: Local with sedation.   ESTIMATED BLOOD LOSS: Minimal.   FLUOROSCOPY TIME:  About two minutes.  CONTRAST USED:  25 mL.   INDICATION FOR PROCEDURE: This is in an 81 year old African American female with end-stage renal disease. Her right arm HeRO graft which has had multiple previous interventions has recently had two episodes of prolonged bleeding and they have had more difficulties with access. Her last noninvasive study was over three months ago. She is brought in for a shuntogram for further evaluation and potential treatment. Risks and benefits were discussed. Informed consent was obtained.   DESCRIPTION OF PROCEDURE: The patient is brought to the vascular suite. Right upper extremity was sterilely prepped and draped and a sterile surgical field was created.  The graft was accessed only a few centimeters beyond the anastomosis to the brachial artery under direct ultrasound guidance with a micropuncture needle.  A micropuncture wire and then sheath were placed and a permanent image was recorded. Imaging was then performed through the micropuncture sheath with compression of the graft to opacify the arterial anastomosis and brachial artery and most proximal portions of the graft. Imaging demonstrated some mild irregularity at the distal portion of the previously placed stent at the arterial access site.  The stenosis was less than 25%.  The remainder of the graft was widely patent without significant stenosis. The silicone central venous  portion was widely patent and the catheter tip was in the right atrium with brisk flow seen on injection.  No intervention was necessary today.  Four-0 Monocryl purse string suture was placed. Pressure was held. Sterile dressing was placed.   The patient tolerated the procedure well and was taken to the recovery room in stable condition.    ____________________________ Annice NeedyJason S. [name], MD jsd:sp D: 08/13/2014 08:35:26 ET T: 08/13/2014 09:56:42 ET JOB#: [id number]  cc: [facility]  Annice NeedyJASON S [name] MD ELECTRONICALLY SIGNED 08/20/2014 15:00

## 2014-10-02 DIAGNOSIS — N2581 Secondary hyperparathyroidism of renal origin: Secondary | ICD-10-CM | POA: Diagnosis not present

## 2014-10-02 DIAGNOSIS — D631 Anemia in chronic kidney disease: Secondary | ICD-10-CM | POA: Diagnosis not present

## 2014-10-02 DIAGNOSIS — N186 End stage renal disease: Secondary | ICD-10-CM | POA: Diagnosis not present

## 2014-10-04 DIAGNOSIS — N186 End stage renal disease: Secondary | ICD-10-CM | POA: Diagnosis not present

## 2014-10-04 DIAGNOSIS — D631 Anemia in chronic kidney disease: Secondary | ICD-10-CM | POA: Diagnosis not present

## 2014-10-04 DIAGNOSIS — N2581 Secondary hyperparathyroidism of renal origin: Secondary | ICD-10-CM | POA: Diagnosis not present

## 2014-10-05 DIAGNOSIS — T827XXS Infection and inflammatory reaction due to other cardiac and vascular devices, implants and grafts, sequela: Secondary | ICD-10-CM | POA: Diagnosis not present

## 2014-10-05 DIAGNOSIS — I10 Essential (primary) hypertension: Secondary | ICD-10-CM | POA: Diagnosis not present

## 2014-10-05 DIAGNOSIS — N186 End stage renal disease: Secondary | ICD-10-CM | POA: Diagnosis not present

## 2014-10-05 DIAGNOSIS — Y841 Kidney dialysis as the cause of abnormal reaction of the patient, or of later complication, without mention of misadventure at the time of the procedure: Secondary | ICD-10-CM | POA: Diagnosis not present

## 2014-10-05 DIAGNOSIS — Z992 Dependence on renal dialysis: Secondary | ICD-10-CM | POA: Diagnosis not present

## 2014-10-05 DIAGNOSIS — E785 Hyperlipidemia, unspecified: Secondary | ICD-10-CM | POA: Diagnosis not present

## 2014-10-06 DIAGNOSIS — D631 Anemia in chronic kidney disease: Secondary | ICD-10-CM | POA: Diagnosis not present

## 2014-10-06 DIAGNOSIS — N186 End stage renal disease: Secondary | ICD-10-CM | POA: Diagnosis not present

## 2014-10-06 DIAGNOSIS — N2581 Secondary hyperparathyroidism of renal origin: Secondary | ICD-10-CM | POA: Diagnosis not present

## 2014-10-09 DIAGNOSIS — N2581 Secondary hyperparathyroidism of renal origin: Secondary | ICD-10-CM | POA: Diagnosis not present

## 2014-10-09 DIAGNOSIS — N186 End stage renal disease: Secondary | ICD-10-CM | POA: Diagnosis not present

## 2014-10-09 DIAGNOSIS — D631 Anemia in chronic kidney disease: Secondary | ICD-10-CM | POA: Diagnosis not present

## 2014-10-11 DIAGNOSIS — N186 End stage renal disease: Secondary | ICD-10-CM | POA: Diagnosis not present

## 2014-10-11 DIAGNOSIS — D631 Anemia in chronic kidney disease: Secondary | ICD-10-CM | POA: Diagnosis not present

## 2014-10-11 DIAGNOSIS — N2581 Secondary hyperparathyroidism of renal origin: Secondary | ICD-10-CM | POA: Diagnosis not present

## 2014-10-13 DIAGNOSIS — D631 Anemia in chronic kidney disease: Secondary | ICD-10-CM | POA: Diagnosis not present

## 2014-10-13 DIAGNOSIS — N186 End stage renal disease: Secondary | ICD-10-CM | POA: Diagnosis not present

## 2014-10-13 DIAGNOSIS — N2581 Secondary hyperparathyroidism of renal origin: Secondary | ICD-10-CM | POA: Diagnosis not present

## 2014-10-16 DIAGNOSIS — N2581 Secondary hyperparathyroidism of renal origin: Secondary | ICD-10-CM | POA: Diagnosis not present

## 2014-10-16 DIAGNOSIS — D631 Anemia in chronic kidney disease: Secondary | ICD-10-CM | POA: Diagnosis not present

## 2014-10-16 DIAGNOSIS — N186 End stage renal disease: Secondary | ICD-10-CM | POA: Diagnosis not present

## 2014-10-18 DIAGNOSIS — D631 Anemia in chronic kidney disease: Secondary | ICD-10-CM | POA: Diagnosis not present

## 2014-10-18 DIAGNOSIS — N2581 Secondary hyperparathyroidism of renal origin: Secondary | ICD-10-CM | POA: Diagnosis not present

## 2014-10-18 DIAGNOSIS — N186 End stage renal disease: Secondary | ICD-10-CM | POA: Diagnosis not present

## 2014-10-20 DIAGNOSIS — D631 Anemia in chronic kidney disease: Secondary | ICD-10-CM | POA: Diagnosis not present

## 2014-10-20 DIAGNOSIS — N2581 Secondary hyperparathyroidism of renal origin: Secondary | ICD-10-CM | POA: Diagnosis not present

## 2014-10-20 DIAGNOSIS — N186 End stage renal disease: Secondary | ICD-10-CM | POA: Diagnosis not present

## 2014-10-23 DIAGNOSIS — D631 Anemia in chronic kidney disease: Secondary | ICD-10-CM | POA: Diagnosis not present

## 2014-10-23 DIAGNOSIS — N2581 Secondary hyperparathyroidism of renal origin: Secondary | ICD-10-CM | POA: Diagnosis not present

## 2014-10-23 DIAGNOSIS — N186 End stage renal disease: Secondary | ICD-10-CM | POA: Diagnosis not present

## 2014-10-25 DIAGNOSIS — D631 Anemia in chronic kidney disease: Secondary | ICD-10-CM | POA: Diagnosis not present

## 2014-10-25 DIAGNOSIS — N186 End stage renal disease: Secondary | ICD-10-CM | POA: Diagnosis not present

## 2014-10-25 DIAGNOSIS — N2581 Secondary hyperparathyroidism of renal origin: Secondary | ICD-10-CM | POA: Diagnosis not present

## 2014-10-27 DIAGNOSIS — D631 Anemia in chronic kidney disease: Secondary | ICD-10-CM | POA: Diagnosis not present

## 2014-10-27 DIAGNOSIS — N2581 Secondary hyperparathyroidism of renal origin: Secondary | ICD-10-CM | POA: Diagnosis not present

## 2014-10-27 DIAGNOSIS — N186 End stage renal disease: Secondary | ICD-10-CM | POA: Diagnosis not present

## 2014-10-30 DIAGNOSIS — D631 Anemia in chronic kidney disease: Secondary | ICD-10-CM | POA: Diagnosis not present

## 2014-10-30 DIAGNOSIS — Z992 Dependence on renal dialysis: Secondary | ICD-10-CM | POA: Diagnosis not present

## 2014-10-30 DIAGNOSIS — N186 End stage renal disease: Secondary | ICD-10-CM | POA: Diagnosis not present

## 2014-10-30 DIAGNOSIS — E1129 Type 2 diabetes mellitus with other diabetic kidney complication: Secondary | ICD-10-CM | POA: Diagnosis not present

## 2014-10-30 DIAGNOSIS — N2581 Secondary hyperparathyroidism of renal origin: Secondary | ICD-10-CM | POA: Diagnosis not present

## 2014-11-01 DIAGNOSIS — A4101 Sepsis due to Methicillin susceptible Staphylococcus aureus: Secondary | ICD-10-CM | POA: Diagnosis not present

## 2014-11-01 DIAGNOSIS — N2581 Secondary hyperparathyroidism of renal origin: Secondary | ICD-10-CM | POA: Diagnosis not present

## 2014-11-01 DIAGNOSIS — N186 End stage renal disease: Secondary | ICD-10-CM | POA: Diagnosis not present

## 2014-11-01 DIAGNOSIS — D631 Anemia in chronic kidney disease: Secondary | ICD-10-CM | POA: Diagnosis not present

## 2014-11-03 DIAGNOSIS — A4101 Sepsis due to Methicillin susceptible Staphylococcus aureus: Secondary | ICD-10-CM | POA: Diagnosis not present

## 2014-11-03 DIAGNOSIS — N186 End stage renal disease: Secondary | ICD-10-CM | POA: Diagnosis not present

## 2014-11-03 DIAGNOSIS — D631 Anemia in chronic kidney disease: Secondary | ICD-10-CM | POA: Diagnosis not present

## 2014-11-03 DIAGNOSIS — N2581 Secondary hyperparathyroidism of renal origin: Secondary | ICD-10-CM | POA: Diagnosis not present

## 2014-11-06 DIAGNOSIS — A4101 Sepsis due to Methicillin susceptible Staphylococcus aureus: Secondary | ICD-10-CM | POA: Diagnosis not present

## 2014-11-06 DIAGNOSIS — N2581 Secondary hyperparathyroidism of renal origin: Secondary | ICD-10-CM | POA: Diagnosis not present

## 2014-11-06 DIAGNOSIS — D631 Anemia in chronic kidney disease: Secondary | ICD-10-CM | POA: Diagnosis not present

## 2014-11-06 DIAGNOSIS — N186 End stage renal disease: Secondary | ICD-10-CM | POA: Diagnosis not present

## 2014-11-08 DIAGNOSIS — D631 Anemia in chronic kidney disease: Secondary | ICD-10-CM | POA: Diagnosis not present

## 2014-11-08 DIAGNOSIS — N186 End stage renal disease: Secondary | ICD-10-CM | POA: Diagnosis not present

## 2014-11-08 DIAGNOSIS — N2581 Secondary hyperparathyroidism of renal origin: Secondary | ICD-10-CM | POA: Diagnosis not present

## 2014-11-08 DIAGNOSIS — A4101 Sepsis due to Methicillin susceptible Staphylococcus aureus: Secondary | ICD-10-CM | POA: Diagnosis not present

## 2014-11-10 DIAGNOSIS — A4101 Sepsis due to Methicillin susceptible Staphylococcus aureus: Secondary | ICD-10-CM | POA: Diagnosis not present

## 2014-11-10 DIAGNOSIS — N186 End stage renal disease: Secondary | ICD-10-CM | POA: Diagnosis not present

## 2014-11-10 DIAGNOSIS — N2581 Secondary hyperparathyroidism of renal origin: Secondary | ICD-10-CM | POA: Diagnosis not present

## 2014-11-10 DIAGNOSIS — D631 Anemia in chronic kidney disease: Secondary | ICD-10-CM | POA: Diagnosis not present

## 2014-11-13 DIAGNOSIS — A4101 Sepsis due to Methicillin susceptible Staphylococcus aureus: Secondary | ICD-10-CM | POA: Diagnosis not present

## 2014-11-13 DIAGNOSIS — N186 End stage renal disease: Secondary | ICD-10-CM | POA: Diagnosis not present

## 2014-11-13 DIAGNOSIS — D631 Anemia in chronic kidney disease: Secondary | ICD-10-CM | POA: Diagnosis not present

## 2014-11-13 DIAGNOSIS — N2581 Secondary hyperparathyroidism of renal origin: Secondary | ICD-10-CM | POA: Diagnosis not present

## 2014-11-15 DIAGNOSIS — A4101 Sepsis due to Methicillin susceptible Staphylococcus aureus: Secondary | ICD-10-CM | POA: Diagnosis not present

## 2014-11-15 DIAGNOSIS — D631 Anemia in chronic kidney disease: Secondary | ICD-10-CM | POA: Diagnosis not present

## 2014-11-15 DIAGNOSIS — N186 End stage renal disease: Secondary | ICD-10-CM | POA: Diagnosis not present

## 2014-11-15 DIAGNOSIS — N2581 Secondary hyperparathyroidism of renal origin: Secondary | ICD-10-CM | POA: Diagnosis not present

## 2014-11-16 ENCOUNTER — Inpatient Hospital Stay (HOSPITAL_COMMUNITY)
Admission: EM | Admit: 2014-11-16 | Discharge: 2014-11-20 | DRG: 871 | Disposition: A | Discharge status: Home / Self Care | Payer: Medicare Other | Attending: Internal Medicine | Admitting: Internal Medicine

## 2014-11-16 ENCOUNTER — Emergency Department (HOSPITAL_COMMUNITY): Payer: Medicare Other

## 2014-11-16 ENCOUNTER — Encounter (HOSPITAL_COMMUNITY): Payer: Self-pay | Admitting: Family Medicine

## 2014-11-16 DIAGNOSIS — T80219A Unspecified infection due to central venous catheter, initial encounter: Secondary | ICD-10-CM | POA: Diagnosis present

## 2014-11-16 DIAGNOSIS — R1110 Vomiting, unspecified: Secondary | ICD-10-CM | POA: Diagnosis not present

## 2014-11-16 DIAGNOSIS — G9340 Encephalopathy, unspecified: Secondary | ICD-10-CM | POA: Diagnosis not present

## 2014-11-16 DIAGNOSIS — E43 Unspecified severe protein-calorie malnutrition: Secondary | ICD-10-CM | POA: Diagnosis present

## 2014-11-16 DIAGNOSIS — L97421 Non-pressure chronic ulcer of left heel and midfoot limited to breakdown of skin: Secondary | ICD-10-CM | POA: Diagnosis not present

## 2014-11-16 DIAGNOSIS — B999 Unspecified infectious disease: Secondary | ICD-10-CM

## 2014-11-16 DIAGNOSIS — I10 Essential (primary) hypertension: Secondary | ICD-10-CM | POA: Diagnosis not present

## 2014-11-16 DIAGNOSIS — T82898A Other specified complication of vascular prosthetic devices, implants and grafts, initial encounter: Secondary | ICD-10-CM | POA: Diagnosis not present

## 2014-11-16 DIAGNOSIS — E785 Hyperlipidemia, unspecified: Secondary | ICD-10-CM | POA: Diagnosis not present

## 2014-11-16 DIAGNOSIS — E119 Type 2 diabetes mellitus without complications: Secondary | ICD-10-CM | POA: Diagnosis not present

## 2014-11-16 DIAGNOSIS — R05 Cough: Secondary | ICD-10-CM | POA: Diagnosis not present

## 2014-11-16 DIAGNOSIS — T827XXS Infection and inflammatory reaction due to other cardiac and vascular devices, implants and grafts, sequela: Secondary | ICD-10-CM | POA: Diagnosis not present

## 2014-11-16 DIAGNOSIS — L97429 Non-pressure chronic ulcer of left heel and midfoot with unspecified severity: Secondary | ICD-10-CM | POA: Diagnosis present

## 2014-11-16 DIAGNOSIS — E11621 Type 2 diabetes mellitus with foot ulcer: Secondary | ICD-10-CM | POA: Diagnosis present

## 2014-11-16 DIAGNOSIS — N2581 Secondary hyperparathyroidism of renal origin: Secondary | ICD-10-CM | POA: Diagnosis present

## 2014-11-16 DIAGNOSIS — I120 Hypertensive chronic kidney disease with stage 5 chronic kidney disease or end stage renal disease: Secondary | ICD-10-CM | POA: Diagnosis present

## 2014-11-16 DIAGNOSIS — Y841 Kidney dialysis as the cause of abnormal reaction of the patient, or of later complication, without mention of misadventure at the time of the procedure: Secondary | ICD-10-CM | POA: Diagnosis not present

## 2014-11-16 DIAGNOSIS — A4101 Sepsis due to Methicillin susceptible Staphylococcus aureus: Secondary | ICD-10-CM | POA: Diagnosis not present

## 2014-11-16 DIAGNOSIS — E1121 Type 2 diabetes mellitus with diabetic nephropathy: Secondary | ICD-10-CM | POA: Diagnosis present

## 2014-11-16 DIAGNOSIS — K219 Gastro-esophageal reflux disease without esophagitis: Secondary | ICD-10-CM | POA: Diagnosis present

## 2014-11-16 DIAGNOSIS — A419 Sepsis, unspecified organism: Secondary | ICD-10-CM

## 2014-11-16 DIAGNOSIS — Z794 Long term (current) use of insulin: Secondary | ICD-10-CM | POA: Diagnosis not present

## 2014-11-16 DIAGNOSIS — R112 Nausea with vomiting, unspecified: Secondary | ICD-10-CM | POA: Diagnosis not present

## 2014-11-16 DIAGNOSIS — E860 Dehydration: Secondary | ICD-10-CM | POA: Diagnosis present

## 2014-11-16 DIAGNOSIS — Z992 Dependence on renal dialysis: Secondary | ICD-10-CM | POA: Diagnosis not present

## 2014-11-16 DIAGNOSIS — R059 Cough, unspecified: Secondary | ICD-10-CM

## 2014-11-16 DIAGNOSIS — M8580 Other specified disorders of bone density and structure, unspecified site: Secondary | ICD-10-CM | POA: Diagnosis present

## 2014-11-16 DIAGNOSIS — D649 Anemia, unspecified: Secondary | ICD-10-CM | POA: Diagnosis present

## 2014-11-16 DIAGNOSIS — Z6823 Body mass index (BMI) 23.0-23.9, adult: Secondary | ICD-10-CM | POA: Diagnosis not present

## 2014-11-16 DIAGNOSIS — L8990 Pressure ulcer of unspecified site, unspecified stage: Secondary | ICD-10-CM | POA: Diagnosis present

## 2014-11-16 DIAGNOSIS — I509 Heart failure, unspecified: Secondary | ICD-10-CM | POA: Diagnosis not present

## 2014-11-16 DIAGNOSIS — G43A0 Cyclical vomiting, not intractable: Secondary | ICD-10-CM

## 2014-11-16 DIAGNOSIS — M79609 Pain in unspecified limb: Secondary | ICD-10-CM | POA: Diagnosis not present

## 2014-11-16 DIAGNOSIS — T827XXA Infection and inflammatory reaction due to other cardiac and vascular devices, implants and grafts, initial encounter: Secondary | ICD-10-CM | POA: Diagnosis not present

## 2014-11-16 DIAGNOSIS — R410 Disorientation, unspecified: Secondary | ICD-10-CM | POA: Diagnosis not present

## 2014-11-16 DIAGNOSIS — E1122 Type 2 diabetes mellitus with diabetic chronic kidney disease: Secondary | ICD-10-CM | POA: Diagnosis present

## 2014-11-16 DIAGNOSIS — R4182 Altered mental status, unspecified: Secondary | ICD-10-CM | POA: Diagnosis not present

## 2014-11-16 DIAGNOSIS — R509 Fever, unspecified: Secondary | ICD-10-CM | POA: Diagnosis not present

## 2014-11-16 DIAGNOSIS — D696 Thrombocytopenia, unspecified: Secondary | ICD-10-CM | POA: Diagnosis present

## 2014-11-16 DIAGNOSIS — N186 End stage renal disease: Secondary | ICD-10-CM | POA: Diagnosis present

## 2014-11-16 DIAGNOSIS — E1129 Type 2 diabetes mellitus with other diabetic kidney complication: Secondary | ICD-10-CM | POA: Diagnosis not present

## 2014-11-16 DIAGNOSIS — N390 Urinary tract infection, site not specified: Secondary | ICD-10-CM | POA: Diagnosis present

## 2014-11-16 DIAGNOSIS — R8290 Unspecified abnormal findings in urine: Secondary | ICD-10-CM | POA: Diagnosis not present

## 2014-11-16 DIAGNOSIS — Z7982 Long term (current) use of aspirin: Secondary | ICD-10-CM | POA: Diagnosis not present

## 2014-11-16 DIAGNOSIS — M1990 Unspecified osteoarthritis, unspecified site: Secondary | ICD-10-CM | POA: Diagnosis present

## 2014-11-16 DIAGNOSIS — L97509 Non-pressure chronic ulcer of other part of unspecified foot with unspecified severity: Secondary | ICD-10-CM

## 2014-11-16 DIAGNOSIS — B9561 Methicillin susceptible Staphylococcus aureus infection as the cause of diseases classified elsewhere: Secondary | ICD-10-CM | POA: Diagnosis not present

## 2014-11-16 HISTORY — DX: End stage renal disease: N18.6

## 2014-11-16 HISTORY — DX: Dependence on renal dialysis: Z99.2

## 2014-11-16 HISTORY — DX: Hyperlipidemia, unspecified: E78.5

## 2014-11-16 HISTORY — DX: Thrombocytopenia, unspecified: D69.6

## 2014-11-16 LAB — GLUCOSE, CAPILLARY: GLUCOSE-CAPILLARY: 104 mg/dL — AB (ref 65–99)

## 2014-11-16 LAB — COMPREHENSIVE METABOLIC PANEL
ALK PHOS: 72 U/L (ref 38–126)
ALT: 13 U/L — ABNORMAL LOW (ref 14–54)
ANION GAP: 13 (ref 5–15)
AST: 26 U/L (ref 15–41)
Albumin: 3.5 g/dL (ref 3.5–5.0)
BILIRUBIN TOTAL: 0.9 mg/dL (ref 0.3–1.2)
BUN: 22 mg/dL — AB (ref 6–20)
CHLORIDE: 97 mmol/L — AB (ref 101–111)
CO2: 29 mmol/L (ref 22–32)
CREATININE: 5.96 mg/dL — AB (ref 0.44–1.00)
Calcium: 9.3 mg/dL (ref 8.9–10.3)
GFR, EST AFRICAN AMERICAN: 7 mL/min — AB (ref >60)
GFR, EST NON AFRICAN AMERICAN: 6 mL/min — AB (ref >60)
GLUCOSE: 115 mg/dL — AB (ref 65–99)
POTASSIUM: 3.5 mmol/L (ref 3.5–5.1)
Sodium: 139 mmol/L (ref 135–145)
Total Protein: 7.6 g/dL (ref 6.5–8.1)

## 2014-11-16 LAB — CBC
HCT: 33.3 % — ABNORMAL LOW (ref 36.0–46.0)
Hemoglobin: 11 g/dL — ABNORMAL LOW (ref 12.0–15.0)
MCH: 30.1 pg (ref 26.0–34.0)
MCHC: 33 g/dL (ref 30.0–36.0)
MCV: 91.2 fL (ref 78.0–100.0)
Platelets: 62 10*3/uL — ABNORMAL LOW (ref 150–400)
RBC: 3.65 MIL/uL — AB (ref 3.87–5.11)
RDW: 17.6 % — ABNORMAL HIGH (ref 11.5–15.5)
WBC: 7.7 10*3/uL (ref 4.0–10.5)

## 2014-11-16 LAB — URINALYSIS, ROUTINE W REFLEX MICROSCOPIC
BILIRUBIN URINE: NEGATIVE
GLUCOSE, UA: NEGATIVE mg/dL
Ketones, ur: NEGATIVE mg/dL
Nitrite: NEGATIVE
Protein, ur: 100 mg/dL — AB
Specific Gravity, Urine: >1.03 — ABNORMAL HIGH (ref 1.005–1.030)
Urobilinogen, UA: 0.2 mg/dL (ref 0.0–1.0)
pH: 8 (ref 5.0–8.0)

## 2014-11-16 LAB — URINE MICROSCOPIC-ADD ON

## 2014-11-16 LAB — TSH: TSH: 2.119 u[IU]/mL (ref 0.350–4.500)

## 2014-11-16 LAB — PHOSPHORUS: PHOSPHORUS: 3.3 mg/dL (ref 2.5–4.6)

## 2014-11-16 LAB — LIPASE, BLOOD: Lipase: 35 U/L (ref 22–51)

## 2014-11-16 LAB — I-STAT CG4 LACTIC ACID, ED: LACTIC ACID, VENOUS: 1.78 mmol/L (ref 0.5–2.0)

## 2014-11-16 LAB — MAGNESIUM: Magnesium: 2 mg/dL (ref 1.7–2.4)

## 2014-11-16 MED ORDER — SODIUM CHLORIDE 0.9 % IV BOLUS (SEPSIS)
250.0000 mL | Freq: Once | INTRAVENOUS | Status: AC
  Administered 2014-11-16: 250 mL via INTRAVENOUS

## 2014-11-16 MED ORDER — SEVELAMER CARBONATE 800 MG PO TABS
800.0000 mg | ORAL_TABLET | Freq: Three times a day (TID) | ORAL | Status: DC
  Administered 2014-11-17 – 2014-11-20 (×9): 800 mg via ORAL
  Filled 2014-11-16 (×13): qty 1

## 2014-11-16 MED ORDER — ONDANSETRON HCL 4 MG PO TABS: 4.0000 mg | ORAL_TABLET | Freq: Four times a day (QID) | ORAL | Status: DC | PRN

## 2014-11-16 MED ORDER — CINACALCET HCL 30 MG PO TABS
30.0000 mg | ORAL_TABLET | Freq: Every day | ORAL | Status: DC
  Administered 2014-11-16 – 2014-11-19 (×3): 30 mg via ORAL
  Filled 2014-11-16 (×6): qty 1

## 2014-11-16 MED ORDER — ASPIRIN EC 81 MG PO TBEC
81.0000 mg | DELAYED_RELEASE_TABLET | Freq: Every day | ORAL | Status: DC
  Administered 2014-11-16 – 2014-11-19 (×4): 81 mg via ORAL
  Filled 2014-11-16 (×5): qty 1

## 2014-11-16 MED ORDER — NEPRO/CARBSTEADY PO LIQD
237.0000 mL | Freq: Three times a day (TID) | ORAL | Status: DC
  Administered 2014-11-17: 237 mL via ORAL

## 2014-11-16 MED ORDER — RENA-VITE PO TABS
1.0000 | ORAL_TABLET | Freq: Every day | ORAL | Status: DC
Start: 2014-11-16 — End: 2014-11-20
  Administered 2014-11-16 – 2014-11-19 (×4): 1 via ORAL
  Filled 2014-11-16 (×5): qty 1

## 2014-11-16 MED ORDER — ESCITALOPRAM OXALATE 5 MG PO TABS
5.0000 mg | ORAL_TABLET | Freq: Every day | ORAL | Status: DC
  Administered 2014-11-16 – 2014-11-19 (×4): 5 mg via ORAL
  Filled 2014-11-16 (×5): qty 1

## 2014-11-16 MED ORDER — SODIUM CHLORIDE 0.9 % IV SOLN
INTRAVENOUS | Status: DC
  Administered 2014-11-16: 22:00:00 via INTRAVENOUS

## 2014-11-16 MED ORDER — ATORVASTATIN CALCIUM 40 MG PO TABS
40.0000 mg | ORAL_TABLET | Freq: Every day | ORAL | Status: DC
  Administered 2014-11-16 – 2014-11-19 (×3): 40 mg via ORAL
  Filled 2014-11-16 (×5): qty 1

## 2014-11-16 MED ORDER — LOPERAMIDE HCL 2 MG PO CAPS: 2.0000 mg | ORAL_CAPSULE | ORAL | Status: DC | PRN

## 2014-11-16 MED ORDER — ACETAMINOPHEN 650 MG RE SUPP: 650.0000 mg | Freq: Four times a day (QID) | RECTAL | Status: DC | PRN

## 2014-11-16 MED ORDER — DIALYVITE 3000 3 MG PO TABS: 1.0000 | ORAL_TABLET | Freq: Every day | ORAL | Status: DC

## 2014-11-16 MED ORDER — ONDANSETRON HCL 4 MG/2ML IJ SOLN
4.0000 mg | Freq: Once | INTRAMUSCULAR | Status: AC
  Administered 2014-11-16: 4 mg via INTRAVENOUS
  Filled 2014-11-16: qty 2

## 2014-11-16 MED ORDER — INSULIN ASPART 100 UNIT/ML ~~LOC~~ SOLN: 0.0000 [IU] | Freq: Three times a day (TID) | SUBCUTANEOUS | Status: DC

## 2014-11-16 MED ORDER — ASPIRIN 81 MG PO TABS: 81.0000 mg | ORAL_TABLET | Freq: Every day | ORAL | Status: DC

## 2014-11-16 MED ORDER — ONDANSETRON HCL 4 MG/2ML IJ SOLN: 4.0000 mg | Freq: Four times a day (QID) | INTRAMUSCULAR | Status: DC | PRN

## 2014-11-16 MED ORDER — DORZOLAMIDE HCL-TIMOLOL MAL 2-0.5 % OP SOLN
1.0000 [drp] | Freq: Every day | OPHTHALMIC | Status: DC
  Administered 2014-11-16 – 2014-11-19 (×4): 1 [drp] via OPHTHALMIC
  Filled 2014-11-16: qty 10

## 2014-11-16 MED ORDER — SODIUM CHLORIDE 0.9 % IV SOLN
INTRAVENOUS | Status: DC
  Administered 2014-11-16: 19:00:00 via INTRAVENOUS

## 2014-11-16 MED ORDER — ACETAMINOPHEN 325 MG PO TABS
650.0000 mg | ORAL_TABLET | Freq: Four times a day (QID) | ORAL | Status: DC | PRN
  Administered 2014-11-17 – 2014-11-18 (×2): 650 mg via ORAL
  Filled 2014-11-16 (×2): qty 2

## 2014-11-16 MED ORDER — PANTOPRAZOLE SODIUM 40 MG PO TBEC
80.0000 mg | DELAYED_RELEASE_TABLET | Freq: Every day | ORAL | Status: DC
  Administered 2014-11-17 – 2014-11-20 (×4): 80 mg via ORAL
  Filled 2014-11-16 (×2): qty 2

## 2014-11-16 MED ORDER — PROMETHAZINE HCL 25 MG/ML IJ SOLN: 12.5000 mg | Freq: Three times a day (TID) | INTRAMUSCULAR | Status: DC | PRN

## 2014-11-16 MED ORDER — DEXTROSE 5 % IV SOLN
1.0000 g | INTRAVENOUS | Status: DC
  Administered 2014-11-16: 1 g via INTRAVENOUS
  Filled 2014-11-16 (×2): qty 10

## 2014-11-16 NOTE — H&P (Signed)
[facility] History and Physical  ELRITA VALDES HXT:[id number] DOB: 1928/12/05 DOA: 11/16/2014  Referring physician: Alroy Dust PCP: Carrie Mew, MD   Chief Complaint: AMS, nausea and vomiting  HPI: [name] is a 81 y.o. female with PMH significant for type 2 diabetes mellitus, ESRD, HTN, HLD and GERD; who was brought by daughter due to increase AMS, nausea and vomiting. Patient's daughter reports symptoms started approx 3 days prior to admission. Patient was unable to keep anything down and patient expressed same behavior in the past with UTI according to daughter.   In ED she was afebrile, denies CP, SOB, hematuria, dysuria, melena and/or hematochezia.  In ED, work up demonstrated dirty UA and suggesting UTI. [facility] called to admit patient for further evaluation and treatment. She received IVF's, cx's taken and got rocephin IV.  Review of Systems:  Constitutional:  No weight loss, night sweats, Fevers, chills, fatigue.  HEENT:  No headaches, Difficulty swallowing,Tooth/dental problems,Sore throat,  No sneezing, itching, ear ache, nasal congestion, post nasal drip,  Cardio-vascular:  No chest pain, Orthopnea, PND, swelling in lower extremities, anasarca, dizziness, palpitations  GI:  No heartburn, indigestion, abdominal pain, nausea, vomiting, diarrhea, change in bowel habits, loss of appetite  Resp:  No shortness of breath with exertion or at rest. No excess mucus, no productive cough, No non-productive cough, No coughing up of blood.No change in color of mucus.No wheezing.No chest wall deformity  Skin:  no rash or lesions.  GU:  no dysuria, change in color of urine, no urgency or frequency. No flank pain.  Musculoskeletal:  No joint pain or swelling. No decreased range of motion. No back pain.  Psych:  No change in mood or affect. No depression or anxiety. No memory loss.   Past Medical History  Diagnosis Date  . Cough   . Diabetes mellitus     type 2  .  GERD (gastroesophageal reflux disease)   . Hypertension   . Bronchitis   . Osteoarthritis   . End stage renal disease   . Hemodialysis patient     esrd   Past Surgical History  Procedure Laterality Date  . Eye surgery      Cataract extraction   Social History:  reports that she has never smoked. She does not have any smokeless tobacco history on file. She reports that she does not drink alcohol or use illicit drugs.  No Known Allergies  Family History  Problem Relation Age of Onset  . Hypertension Mother     Prior to Admission medications   Medication Sig Start Date End Date Taking? Authorizing Provider  Acetaminophen (TYLENOL EXTRA STRENGTH) 167 MG/5ML LIQD Take 5 mLs by mouth daily as needed. For pain/fever   Yes Historical Provider, MD  amLODipine (NORVASC) 5 MG tablet Take 2.5 mg by mouth daily. Take half tablet on Sundays mondays wednesdays and fridays 04/05/12  Yes Eulis Foster, FNP  aspirin 81 MG tablet Take 81 mg by mouth daily.   Yes Historical Provider, MD  cinacalcet (SENSIPAR) 30 MG tablet Take 30 mg by mouth daily.   Yes Historical Provider, MD  Dorzolamide HCl-Timolol Mal (COSOPT OP) Place 1 drop into both eyes daily.    Yes Historical Provider, MD  escitalopram (LEXAPRO) 10 MG tablet TAKE ONE-HALF TABLET BY MOUTH ONCE DAILY 02/07/14  Yes Stacie Glaze, MD  folic acid-vitamin b complex-vitamin c-selenium-zinc (DIALYVITE) 3 MG TABS Take 1 tablet by mouth daily.     Yes Historical Provider, MD  loperamide (  IMODIUM) 2 MG capsule Take 1 capsule (2 mg total) by mouth as needed for diarrhea or loose stools. 07/28/13  Yes Maryann Mikhail, DO  multivitamin (RENA-VIT) TABS tablet Take 1 tablet by mouth at bedtime. 07/28/13  Yes Maryann Mikhail, DO  NEXIUM 40 MG capsule TAKE 1 CAPSULE EVERY DAY 10/17/12  Yes Stacie Glaze, MD  promethazine (PHENERGAN) 25 MG tablet Take 12.5 mg by mouth every 6 (six) hours as needed for nausea or vomiting.   Yes Historical Provider, MD  quiNINE  (QUALAQUIN) 324 MG capsule Take 648 mg by mouth 3 (three) times daily. Take on tuesdays thursdays and saturdays   Yes Historical Provider, MD  sevelamer (RENAGEL) 800 MG tablet Take 800 mg by mouth 4 (four) times daily.    Yes Historical Provider, MD  simvastatin (ZOCOR) 80 MG tablet TAKE ONE TABLET BY MOUTH EVERY DAY 12/06/13  Yes Gordy Savers, MD   Physical Exam: Filed Vitals:   11/16/14 1348 11/16/14 1845  BP: 112/44 114/51  Pulse: 91 92  Temp: 98.9 F (37.2 C)   Resp: 18 18  SpO2: 95% 97%    Wt Readings from Last 3 Encounters:  11/10/13 60.328 kg (133 lb)  07/28/13 61.9 kg (136 lb 7.4 oz)  07/21/13 65.318 kg (144 lb)    General:  Appears calm and comfortable, currently denies CP or SOB. Patient is afebrile, slightly confused but able to follow comands  Eyes: PERRL, normal lids, irises & conjunctiva, no icterus ENT: grossly normal hearing, lips & tongue, no erythema or exudates Neck: no LAD, masses or thyromegaly, no JVD Cardiovascular: positive murmur, no rubs or gallops, S1 and S2 appreciated Respiratory: CTA bilaterally, no w/r/r. Normal respiratory effort. Abdomen: soft, no tender, no distended, positive BS Skin: no rash or induration seen on exam Musculoskeletal: grossly normal tone BUE/BLE; trace edema bilaterally Psychiatric: no hallucinations; no agitation. Neurologic: Oriented X2, otherwise grossly non-focal.          Labs on Admission:  Basic Metabolic Panel:  Recent Labs Lab 11/16/14 1358  NA 139  K 3.5  CL 97*  CO2 29  GLUCOSE 115*  BUN 22*  CREATININE 5.96*  CALCIUM 9.3   Liver Function Tests:  Recent Labs Lab 11/16/14 1358  AST 26  ALT 13*  ALKPHOS 72  BILITOT 0.9  PROT 7.6  ALBUMIN 3.5    Recent Labs Lab 11/16/14 1748  LIPASE 35   CBC:  Recent Labs Lab 11/16/14 1358  WBC 7.7  HGB 11.0*  HCT 33.3*  MCV 91.2  PLT 62*   CBG: No results for input(s): GLUCAP in the last 168 hours.  Radiological Exams on Admission: Dg  Chest 2 View  11/16/2014   CLINICAL DATA:  Confusion, lethargy and vomiting for 1 day  EXAM: CHEST  2 VIEW  COMPARISON:  Radiograph 07/24/2013  FINDINGS: Large-bore left and right central venous lines noted. No pulmonary edema. No pleural fluid. Cardiac silhouette is normal.  IMPRESSION: No acute cardiopulmonary process.   Electronically Signed   By: Genevive Bi M.D.   On: 11/16/2014 17:57   Ct Head Wo Contrast  11/16/2014   CLINICAL DATA:  Confusion, lethargy, vomiting, diabetes mellitus, hypertension, end-stage renal disease on dialysis  EXAM: CT HEAD WITHOUT CONTRAST  TECHNIQUE: Contiguous axial images were obtained from the base of the skull through the vertex without intravenous contrast.  COMPARISON:  07/07/2010 ; correlation MR brain 07/23/2010  FINDINGS: Generalized atrophy.  Normal ventricular morphology.  No midline shift or mass  effect.  Small vessel chronic ischemic changes of deep cerebral white matter.  Asymmetric positioning in gantry.  No gross intracranial hemorrhage, mass lesion, or evidence acute infarction.  Small exostosis arising from the inner table of the RIGHT temporal bone appears unchanged.  Extensive atherosclerotic calcifications at skullbase.  Calcified RIGHT intraorbital mass identified at inferior RIGHT orbit, 22 x 14 mm unchanged ; this was previously evaluated by MR.  Partial opacification of LEFT ethmoid air cells.  No acute osseous findings.  IMPRESSION: Atrophy with small vessel chronic ischemic changes of deep cerebral white matter.  No acute intracranial abnormalities.  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   Electronically Signed   By: Ulyses Southward M.D.   On: 11/16/2014 17:29    EKG:  Mild tachycardia, sinus rhythm, no acute ischemic changes and non- specific T wave abnormalities.  Assessment/Plan 1-N/V and AMS: secondary to UTI most likely -patient with mild dehydration on  exam -will start patient on rocephin IV empirically -follow urine culture  -IVF's, PRN antiemetics and supportive care -normal lactic acid, WBC's WNL and patient w/o fever -if she spike fever will check blood cx's as well  2-Type 2 diabetes with nephropathy: will check A1C -patient was following diet control -will use SSI sensitive  3-HLD (hyperlipidemia): will continue statins  4-GERD: continue PPI  5-End stage renal disease: renal service contacted for continuation of HD  6-Thrombocytopenia: no signs of acute bleeding -peripheral smear ordered -will hold heparin products -follow Hgb trend  7-protein calorie malnutrition: severe -will start Nepro TID -will consult dietitian   Renal service (Dr. Keitha Butte)  Code Status: Full DVT Prophylaxis:SCD's (platelets less than 100,000) Family Communication: daughter at bedside Disposition Plan: inpatient, med-surg bed; LOS > 2 midnights  Time spent: 65 minutes  Vassie Loll [facility] Pager [phone]

## 2014-11-16 NOTE — ED Provider Notes (Signed)
CSN: [id number]     Arrival date & time 11/16/14  1321 History   First MD Initiated Contact with Patient 11/16/14 1603     Chief Complaint  Patient presents with  . Altered Mental Status     (Consider location/radiation/quality/duration/timing/severity/associated sxs/prior Treatment) HPI Comments: Patient here with confusion and lethargy and vomiting which began this morning. Patient is dialysis patient and had dialysis yesterday. Her daughter states that this morning she became more confused without fever. She has had nonbilious emesis along with diarrhea today. No cough or fever. No recent falls. Daughter states that she's had these symptoms before when she has a UTI. No rashes appreciated. Symptoms persistent and nothing makes them better or worse. No treatment used for this prior to arrival.  Patient is a 81 y.o. female presenting with altered mental status. The history is provided by the patient and a caregiver.  Altered Mental Status   Past Medical History  Diagnosis Date  . Cough   . Diabetes mellitus     type 2  . GERD (gastroesophageal reflux disease)   . Hypertension   . Bronchitis   . Osteoarthritis   . End stage renal disease   . Hemodialysis patient     esrd   Past Surgical History  Procedure Laterality Date  . Eye surgery      Cataract extraction   Family History  Problem Relation Age of Onset  . Hypertension Mother    History  Substance Use Topics  . Smoking status: Never Smoker   . Smokeless tobacco: Not on file  . Alcohol Use: No   OB History    No data available     Review of Systems  All other systems reviewed and are negative.     Allergies  Review of patient's allergies indicates no known allergies.  Home Medications   Prior to Admission medications   Medication Sig Start Date End Date Taking? Authorizing Provider  Acetaminophen (TYLENOL EXTRA STRENGTH) 167 MG/5ML LIQD Take 5 mLs by mouth daily as needed. For pain/fever    Historical  Provider, MD  amLODipine (NORVASC) 5 MG tablet Take 2.5 mg by mouth daily. Take half tablet on Sundays mondays wednesdays and fridays 04/05/12   Eulis Foster, FNP  aspirin 81 MG tablet Take 81 mg by mouth daily.    Historical Provider, MD  cinacalcet (SENSIPAR) 30 MG tablet Take 30 mg by mouth daily.    Historical Provider, MD  Dorzolamide HCl-Timolol Mal (COSOPT OP) Place 1 drop into both eyes daily.     Historical Provider, MD  escitalopram (LEXAPRO) 10 MG tablet TAKE ONE-HALF TABLET BY MOUTH ONCE DAILY 02/07/14   Stacie Glaze, MD  folic acid-vitamin b complex-vitamin c-selenium-zinc (DIALYVITE) 3 MG TABS Take 1 tablet by mouth daily.      Historical Provider, MD  levocetirizine (XYZAL) 5 MG tablet Take 5 mg by mouth daily. 1/2 once daily    Historical Provider, MD  loperamide (IMODIUM) 2 MG capsule Take 1 capsule (2 mg total) by mouth as needed for diarrhea or loose stools. 07/28/13   Maryann Mikhail, DO  multivitamin (RENA-VIT) TABS tablet Take 1 tablet by mouth at bedtime. 07/28/13   Maryann Mikhail, DO  NEXIUM 40 MG capsule TAKE 1 CAPSULE EVERY DAY 10/17/12   Stacie Glaze, MD  Nutritional Supplements (FEEDING SUPPLEMENT, NEPRO CARB STEADY,) LIQD Take 237 mLs by mouth daily. 07/28/13   Maryann Mikhail, DO  promethazine (PHENERGAN) 25 MG tablet Take 12.5 mg by mouth  every 6 (six) hours as needed for nausea or vomiting.    Historical Provider, MD  quiNINE (QUALAQUIN) 324 MG capsule Take 648 mg by mouth 3 (three) times daily. Take on tuesdays thursdays and saturdays    Historical Provider, MD  sevelamer (RENAGEL) 800 MG tablet Take 800 mg by mouth 4 (four) times daily.     Historical Provider, MD  simvastatin (ZOCOR) 80 MG tablet TAKE ONE TABLET BY MOUTH EVERY DAY 12/06/13   Gordy Savers, MD   BP 112/44 mmHg  Pulse 91  Temp(Src) 98.9 F (37.2 C)  Resp 18  SpO2 95% Physical Exam  Constitutional: She appears well-developed and well-nourished. She appears lethargic.  Non-toxic appearance.  No distress.  HENT:  Head: Normocephalic and atraumatic.  Eyes: Conjunctivae, EOM and lids are normal. Pupils are equal, round, and reactive to light.  Neck: Normal range of motion. Neck supple. No tracheal deviation present. No thyroid mass present.  Cardiovascular: Normal rate, regular rhythm and normal heart sounds.  Exam reveals no gallop.   No murmur heard. Pulmonary/Chest: Effort normal and breath sounds normal. No stridor. No respiratory distress. She has no decreased breath sounds. She has no wheezes. She has no rhonchi. She has no rales.  Abdominal: Soft. Normal appearance and bowel sounds are normal. She exhibits no distension. There is no tenderness. There is no rebound and no CVA tenderness.  Musculoskeletal: Normal range of motion. She exhibits no edema or tenderness.  Neurological: She appears lethargic. She displays no atrophy. No cranial nerve deficit or sensory deficit. GCS eye subscore is 4. GCS verbal subscore is 4. GCS motor subscore is 5.  Skin: Skin is warm and dry. No abrasion and no rash noted.  Psychiatric: Her affect is blunt. Her speech is delayed. She is slowed.  Nursing note and vitals reviewed.   ED Course  Procedures (including critical care time) Labs Review Labs Reviewed  CBC - Abnormal; Notable for the following:    RBC 3.65 (*)    Hemoglobin 11.0 (*)    HCT 33.3 (*)    RDW 17.6 (*)    Platelets 62 (*)    All other components within normal limits  COMPREHENSIVE METABOLIC PANEL - Abnormal; Notable for the following:    Chloride 97 (*)    Glucose, Bld 115 (*)    BUN 22 (*)    Creatinine, Ser 5.96 (*)    ALT 13 (*)    GFR calc non Af Amer 6 (*)    GFR calc Af Amer 7 (*)    All other components within normal limits  URINALYSIS, ROUTINE W REFLEX MICROSCOPIC (NOT AT [facility])  LIPASE, BLOOD  I-STAT CG4 LACTIC ACID, ED    Imaging Review No results found.   EKG Interpretation None      MDM   Final diagnoses:  Cough  Cough    Patient  started on Rocephin for her UTI. Given Zofran for vomiting. Will be admitted to the hospitalist service    Lorre Nick, MD 11/16/14 [phone]

## 2014-11-16 NOTE — ED Notes (Signed)
Unable to obtain iv and blood draw, phlebotomy called.

## 2014-11-16 NOTE — ED Notes (Addendum)
Pt here with confusion, lethargy, and vomiting. Per family pt has similar symptoms when she has UTI. sts usually alert and oriented. sts she vomited twice this am.

## 2014-11-17 DIAGNOSIS — N186 End stage renal disease: Secondary | ICD-10-CM

## 2014-11-17 DIAGNOSIS — N390 Urinary tract infection, site not specified: Secondary | ICD-10-CM

## 2014-11-17 DIAGNOSIS — A419 Sepsis, unspecified organism: Secondary | ICD-10-CM

## 2014-11-17 DIAGNOSIS — R112 Nausea with vomiting, unspecified: Secondary | ICD-10-CM

## 2014-11-17 DIAGNOSIS — G9340 Encephalopathy, unspecified: Secondary | ICD-10-CM

## 2014-11-17 LAB — CBC
HCT: 29.3 % — ABNORMAL LOW (ref 36.0–46.0)
HEMATOCRIT: 27.8 % — AB (ref 36.0–46.0)
Hemoglobin: 9.4 g/dL — ABNORMAL LOW (ref 12.0–15.0)
Hemoglobin: 9 g/dL — ABNORMAL LOW (ref 12.0–15.0)
MCH: 29.7 pg (ref 26.0–34.0)
MCH: 29.1 pg (ref 26.0–34.0)
MCHC: 32.1 g/dL (ref 30.0–36.0)
MCHC: 32.4 g/dL (ref 30.0–36.0)
MCV: 92.4 fL (ref 78.0–100.0)
MCV: 90 fL (ref 78.0–100.0)
PLATELETS: 61 10*3/uL — AB (ref 150–400)
Platelets: 54 10*3/uL — ABNORMAL LOW (ref 150–400)
RBC: 3.17 MIL/uL — AB (ref 3.87–5.11)
RBC: 3.09 MIL/uL — ABNORMAL LOW (ref 3.87–5.11)
RDW: 17.8 % — AB (ref 11.5–15.5)
RDW: 18 % — ABNORMAL HIGH (ref 11.5–15.5)
WBC: 7.6 10*3/uL (ref 4.0–10.5)
WBC: 7.5 10*3/uL (ref 4.0–10.5)

## 2014-11-17 LAB — BASIC METABOLIC PANEL
Anion gap: 10 (ref 5–15)
BUN: 32 mg/dL — ABNORMAL HIGH (ref 6–20)
CALCIUM: 8.4 mg/dL — AB (ref 8.9–10.3)
CO2: 31 mmol/L (ref 22–32)
Chloride: 99 mmol/L — ABNORMAL LOW (ref 101–111)
Creatinine, Ser: 7.5 mg/dL — ABNORMAL HIGH (ref 0.44–1.00)
GFR calc non Af Amer: 4 mL/min — ABNORMAL LOW (ref >60)
GFR, EST AFRICAN AMERICAN: 5 mL/min — AB (ref >60)
GLUCOSE: 80 mg/dL (ref 65–99)
Potassium: 3.8 mmol/L (ref 3.5–5.1)
SODIUM: 140 mmol/L (ref 135–145)

## 2014-11-17 LAB — RENAL FUNCTION PANEL
ALBUMIN: 2.9 g/dL — AB (ref 3.5–5.0)
Anion gap: 11 (ref 5–15)
BUN: 42 mg/dL — AB (ref 6–20)
CALCIUM: 8.1 mg/dL — AB (ref 8.9–10.3)
CHLORIDE: 96 mmol/L — AB (ref 101–111)
CO2: 26 mmol/L (ref 22–32)
CREATININE: 8.42 mg/dL — AB (ref 0.44–1.00)
GFR calc Af Amer: 4 mL/min — ABNORMAL LOW (ref >60)
GFR calc non Af Amer: 4 mL/min — ABNORMAL LOW (ref >60)
GLUCOSE: 76 mg/dL (ref 65–99)
POTASSIUM: 3.9 mmol/L (ref 3.5–5.1)
Phosphorus: 3.6 mg/dL (ref 2.5–4.6)
Sodium: 133 mmol/L — ABNORMAL LOW (ref 135–145)

## 2014-11-17 LAB — CLOSTRIDIUM DIFFICILE BY PCR: CDIFFPCR: NEGATIVE

## 2014-11-17 LAB — GLUCOSE, CAPILLARY
Glucose-Capillary: 70 mg/dL (ref 65–99)
Glucose-Capillary: 125 mg/dL — ABNORMAL HIGH (ref 65–99)
Glucose-Capillary: 159 mg/dL — ABNORMAL HIGH (ref 65–99)

## 2014-11-17 MED ORDER — HEPARIN SODIUM (PORCINE) 1000 UNIT/ML DIALYSIS: 3500.0000 [IU] | Freq: Once | INTRAMUSCULAR | Status: DC

## 2014-11-17 MED ORDER — DOXERCALCIFEROL 4 MCG/2ML IV SOLN
2.0000 ug | INTRAVENOUS | Status: DC
  Administered 2014-11-17 – 2014-11-20 (×2): 2 ug via INTRAVENOUS
  Filled 2014-11-17 (×2): qty 2

## 2014-11-17 MED ORDER — SODIUM CHLORIDE 0.9 % IV SOLN: 100.0000 mL | INTRAVENOUS | Status: DC | PRN

## 2014-11-17 MED ORDER — PENTAFLUOROPROP-TETRAFLUOROETH EX AERO: 1.0000 "application " | INHALATION_SPRAY | CUTANEOUS | Status: DC | PRN

## 2014-11-17 MED ORDER — DARBEPOETIN ALFA 100 MCG/0.5ML IJ SOSY
PREFILLED_SYRINGE | INTRAMUSCULAR | Status: AC
  Administered 2014-11-17: 100 ug
  Filled 2014-11-17: qty 0.5

## 2014-11-17 MED ORDER — VANCOMYCIN HCL IN DEXTROSE 750-5 MG/150ML-% IV SOLN
750.0000 mg | INTRAVENOUS | Status: DC
  Administered 2014-11-17: 750 mg via INTRAVENOUS
  Filled 2014-11-17 (×2): qty 150

## 2014-11-17 MED ORDER — PIPERACILLIN-TAZOBACTAM IN DEX 2-0.25 GM/50ML IV SOLN
2.2500 g | Freq: Three times a day (TID) | INTRAVENOUS | Status: DC
  Administered 2014-11-17 – 2014-11-18 (×4): 2.25 g via INTRAVENOUS
  Filled 2014-11-17 (×6): qty 50

## 2014-11-17 MED ORDER — ALTEPLASE 2 MG IJ SOLR
2.0000 mg | Freq: Once | INTRAMUSCULAR | Status: DC | PRN
  Filled 2014-11-17: qty 2

## 2014-11-17 MED ORDER — DOXERCALCIFEROL 4 MCG/2ML IV SOLN
INTRAVENOUS | Status: AC
  Filled 2014-11-17: qty 2

## 2014-11-17 MED ORDER — LIDOCAINE HCL (PF) 1 % IJ SOLN: 5.0000 mL | INTRAMUSCULAR | Status: DC | PRN

## 2014-11-17 MED ORDER — VANCOMYCIN HCL 10 G IV SOLR
1250.0000 mg | Freq: Once | INTRAVENOUS | Status: AC
  Administered 2014-11-17: 1250 mg via INTRAVENOUS
  Filled 2014-11-17: qty 1250

## 2014-11-17 MED ORDER — NEPRO/CARBSTEADY PO LIQD
237.0000 mL | ORAL | Status: DC
Start: 2014-11-18 — End: 2014-11-20
  Administered 2014-11-18 – 2014-11-19 (×2): 237 mL via ORAL

## 2014-11-17 MED ORDER — HEPARIN SODIUM (PORCINE) 1000 UNIT/ML DIALYSIS: 1000.0000 [IU] | INTRAMUSCULAR | Status: DC | PRN

## 2014-11-17 MED ORDER — DARBEPOETIN ALFA 100 MCG/0.5ML IJ SOSY
100.0000 ug | PREFILLED_SYRINGE | INTRAMUSCULAR | Status: DC
  Filled 2014-11-17: qty 0.5

## 2014-11-17 MED ORDER — NEPRO/CARBSTEADY PO LIQD
237.0000 mL | ORAL | Status: DC | PRN
  Filled 2014-11-17: qty 237

## 2014-11-17 MED ORDER — AMLODIPINE BESYLATE 5 MG PO TABS
5.0000 mg | ORAL_TABLET | ORAL | Status: DC
  Administered 2014-11-18 – 2014-11-19 (×2): 5 mg via ORAL
  Filled 2014-11-17 (×2): qty 1

## 2014-11-17 MED ORDER — LIDOCAINE-PRILOCAINE 2.5-2.5 % EX CREA: 1.0000 "application " | TOPICAL_CREAM | CUTANEOUS | Status: DC | PRN

## 2014-11-17 NOTE — Progress Notes (Signed)
Initial Nutrition Assessment  DOCUMENTATION CODES:  Not applicable  INTERVENTION:  Nepro Shake po daily, each supplement provides 425 kcal and 19 grams protein  NUTRITION DIAGNOSIS:  Increased nutrient needs related to chronic illness as evidenced by estimated needs.   GOAL:  Patient will meet greater than or equal to 90% of their needs   MONITOR:  PO intake, Supplement acceptance, Skin, Weight trends, Labs, I & O's  REASON FOR ASSESSMENT:  Consult Assessment of nutrition requirement/status  ASSESSMENT: [name] is a 81 y.o. female with PMH significant for type 2 diabetes mellitus, ESRD, HTN, HLD and GERD; who was brought by daughter due to increase AMS, nausea and vomiting. Patient's daughter reports symptoms started approx 3 days prior to admission. Patient was unable to keep anything down and patient expressed same behavior in the past with UTI according to daughter.  Pt reports progressive wt loss over the past several years, reporting UBW of around 160#. She denies any recent weight loss and reports her appetite has been good PTA and currently. Noted 85% meal completion. However, she does complain about being unable to have salt. RD reinforced rationale for diet restrictions; pt accepted explanation.   Noted pt had no teeth, but denied difficulty chewing or swallowing foods. She consumes a regular diet at home and refused offer of diet downgrade.   Pt reports she walks with a walker at baseline; no recent changes to activity level.   Nutrition-Focused physical exam completed. Findings are no fat depletion, no muscle depletion, and no edema.   Height:  Ht Readings from Last 1 Encounters:  11/16/14 5\' 4"  (1.626 m)    Weight:  Wt Readings from Last 1 Encounters:  11/16/14 134 lb 11.2 oz (61.1 kg)    Ideal Body Weight:  54.5 kg  Wt Readings from Last 10 Encounters:  11/16/14 134 lb 11.2 oz (61.1 kg)  11/10/13 133 lb (60.328 kg)  07/28/13 136 lb 7.4 oz  (61.9 kg)  07/21/13 144 lb (65.318 kg)  05/12/13 144 lb (65.318 kg)  01/09/13 147 lb (66.679 kg)  07/18/12 147 lb (66.679 kg)  01/06/12 139 lb (63.05 kg)  09/02/11 147 lb (66.679 kg)  05/01/11 147 lb (66.679 kg)    BMI:  Body mass index is 23.11 kg/(m^2).  Estimated Nutritional Needs:  Kcal:  1600-1800  Protein:  75-85 grams  Fluid:  > 1.5 L  Skin:  Reviewed, no issues  Diet Order:  Diet renal/carb modified with fluid restriction Diet-HS Snack?: Nothing; Room service appropriate?: Yes; Fluid consistency:: Thin  EDUCATION NEEDS:  Education needs addressed   Intake/Output Summary (Last 24 hours) at 11/17/14 1751 Last data filed at 11/17/14 1512  Gross per 24 hour  Intake    400 ml  Output      6 ml  Net    394 ml    Last BM:  11/17/14  Pradeep Beaubrun A. Mayford Knife, RD, LDN, CDE Pager: [phone] After hours Pager: [phone]

## 2014-11-17 NOTE — Progress Notes (Signed)
[facility] Progress Note   [name] ZOX:[id number] DOB: 1929/05/21 DOA: 11/16/2014 PCP: Carrie Mew, MD  Brief narrative: [name] is a 81 y.o. female end-stage renal disease on hemodialysis, diabetes mellitus type 2, hypertension who was brought in by family for confusion. She was found to have a positive UA. The family stated that she gets confused with urinary tract infections. She was started on Rocephin for UTI. She continued to have fevers overnight in the hospital.   Subjective: She is quite alert and oriented this morning. Aware that she is in the hospital but thinks she was brought in here due to issues with her dialysis access. No complaints of dysuria. No complaints of nausea, vomiting or abdominal pain.  Assessment/Plan: Principal Problem:   Sepsis -With fever as high as 102 and confusion which has now improved (A) UTI-started on ceftriaxone in ER-switched to vancomycin and Zosyn as fevers had not resolved-  culture not sent-have requested a culture but this is after she's been started on antibiotics (B) gram-positive cocci on blood cultures in one set of cultures-vancomycin started earlier this morning- will continue  Active Problems:     Acute encephalopathy -Due to above and now resolved  N&V (nausea and vomiting) -Resolved-patient tolerating diet    Thrombocytopenia -Possibly secondary to sepsis - cont to follow- avoid heparin  Diet controlled DM   Code Status: full code Family Communication:  Disposition Plan: f/u cultures DVT prophylaxis: SCDs Consultants:nephrology Procedures:  Antibiotics: Anti-infectives    Start     Dose/Rate Route Frequency Ordered Stop   11/17/14 1200  vancomycin (VANCOCIN) IVPB 750 mg/150 ml premix     750 mg 150 mL/hr over 60 Minutes Intravenous Every T-Th-Sa (Hemodialysis) 11/17/14 0942     11/17/14 0945  vancomycin (VANCOCIN) 1,250 mg in sodium chloride 0.9 % 250 mL IVPB     1,250 mg 166.7 mL/hr  over 90 Minutes Intravenous  Once 11/17/14 0939 11/17/14 1203   11/17/14 0945  piperacillin-tazobactam (ZOSYN) IVPB 2.25 g     2.25 g 100 mL/hr over 30 Minutes Intravenous 3 times per day 11/17/14 0939     11/16/14 1745  cefTRIAXone (ROCEPHIN) 1 g in dextrose 5 % 50 mL IVPB  Status:  Discontinued     1 g 100 mL/hr over 30 Minutes Intravenous Every 24 hours 11/16/14 1737 11/17/14 0855      Objective: Filed Weights   11/16/14 2019  Weight: 61.1 kg (134 lb 11.2 oz)    Intake/Output Summary (Last 24 hours) at 11/17/14 1315 Last data filed at 11/17/14 1306  Gross per 24 hour  Intake    400 ml  Output      5 ml  Net    395 ml     Vitals Filed Vitals:   11/16/14 1845 11/16/14 2019 11/16/14 2138 11/17/14 0600  BP: 114/51 116/47 123/46 129/46  Pulse: 92 91 91 92  Temp:  101.8 F (38.8 C) 102.2 F (39 C) 102.8 F (39.3 C)  TempSrc:  Oral Oral Oral  Resp: Height:   (1.626 m)    Weight:  61.1 kg (134 lb 11.2 oz)    SpO2: 97% 100% 100% 100%    Exam:  General:  Pt is alert, not in acute distress  HEENT: No icterus, No thrush, oral mucosa moist  Cardiovascular: regular rate and rhythm, S1/S2 No murmur  Respiratory: clear to auscultation bilaterally   Abdomen: Soft, +Bowel sounds, non tender, non distended, no  guarding  MSK: No LE edema, cyanosis or clubbing  Data Reviewed: Basic Metabolic Panel:  Recent Labs Lab 11/16/14 1358 11/16/14 2125 11/17/14 0453  NA 139  --  140  K 3.5  --  3.8  CL 97*  --  99*  CO2 29  --  31  GLUCOSE 115*  --  80  BUN 22*  --  32*  CREATININE 5.96*  --  7.50*  CALCIUM 9.3  --  8.4*  MG  --  2.0  --   PHOS  --  3.3  --    Liver Function Tests:  Recent Labs Lab 11/16/14 1358  AST 26  ALT 13*  ALKPHOS 72  BILITOT 0.9  PROT 7.6  ALBUMIN 3.5    Recent Labs Lab 11/16/14 1748  LIPASE 35   No results for input(s): AMMONIA in the last 168 hours. CBC:  Recent Labs Lab 11/16/14 1358 11/17/14 0453  WBC  7.7 7.6  HGB 11.0* 9.4*  HCT 33.3* 29.3*  MCV 91.2 92.4  PLT 62* 61*   Cardiac Enzymes: No results for input(s): CKTOTAL, CKMB, CKMBINDEX, TROPONINI in the last 168 hours. BNP (last 3 results) No results for input(s): BNP in the last 8760 hours.  ProBNP (last 3 results) No results for input(s): PROBNP in the last 8760 hours.  CBG:  Recent Labs Lab 11/16/14 2016 11/17/14 0823 11/17/14 1220  GLUCAP 104* 70 125*    Recent Results (from the past 240 hour(s))  Culture, blood (routine x 2)     Status: None (Preliminary result)   Collection Time: 11/16/14 11:24 PM  Result Value Ref Range Status   Specimen Description BLOOD LEFT HAND  Final   Special Requests BOTTLES DRAWN AEROBIC ONLY 3CC  Final   Culture  Setup Time   Final    GRAM POSITIVE COCCI IN CLUSTERS AEROBIC BOTTLE ONLY CRITICAL RESULT CALLED TO, READ BACK BY AND VERIFIED WITH: S JETER 11/17/14 @ 1236 M VESTAL    Culture GRAM POSITIVE COCCI  Final   Report Status PENDING  Incomplete     Studies: Dg Chest 2 View  11/16/2014   CLINICAL DATA:  Confusion, lethargy and vomiting for 1 day  EXAM: CHEST  2 VIEW  COMPARISON:  Radiograph 07/24/2013  FINDINGS: Large-bore left and right central venous lines noted. No pulmonary edema. No pleural fluid. Cardiac silhouette is normal.  IMPRESSION: No acute cardiopulmonary process.   Electronically Signed   By: Genevive Bi M.D.   On: 11/16/2014 17:57   Ct Head Wo Contrast  11/16/2014   CLINICAL DATA:  Confusion, lethargy, vomiting, diabetes mellitus, hypertension, end-stage renal disease on dialysis  EXAM: CT HEAD WITHOUT CONTRAST  TECHNIQUE: Contiguous axial images were obtained from the base of the skull through the vertex without intravenous contrast.  COMPARISON:  07/07/2010 ; correlation MR brain 07/23/2010  FINDINGS: Generalized atrophy.  Normal ventricular morphology.  No midline shift or mass effect.  Small vessel chronic ischemic changes of deep cerebral white matter.   Asymmetric positioning in gantry.  No gross intracranial hemorrhage, mass lesion, or evidence acute infarction.  Small exostosis arising from the inner table of the RIGHT temporal bone appears unchanged.  Extensive atherosclerotic calcifications at skullbase.  Calcified RIGHT intraorbital mass identified at inferior RIGHT orbit, 22 x 14 mm unchanged ; this was previously evaluated by MR.  Partial opacification of LEFT ethmoid air cells.  No acute osseous findings.  IMPRESSION: Atrophy with small vessel chronic ischemic changes of deep cerebral white  matter.  No acute intracranial abnormalities.  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   Electronically Signed   By: Ulyses Southward M.D.   On: 11/16/2014 17:29    Scheduled Meds:  Scheduled Meds: . [START ON 11/18/2014] amLODipine  5 mg Oral Q M,W,F,Su-1800  . aspirin EC  81 mg Oral Daily  . atorvastatin  40 mg Oral q1800  . cinacalcet  30 mg Oral Q breakfast  . darbepoetin (ARANESP) injection - DIALYSIS  100 mcg Intravenous Q Sat-HD  . dorzolamide-timolol  1 drop Both Eyes Daily  . doxercalciferol  2 mcg Intravenous Q T,Th,Sa-HD  . escitalopram  5 mg Oral Daily  . feeding supplement (NEPRO CARB STEADY)  237 mL Oral TID BM  . insulin aspart  0-9 Units Subcutaneous TID WC  . multivitamin  1 tablet Oral QHS  . pantoprazole  80 mg Oral Q1200  . piperacillin-tazobactam (ZOSYN)  IV  2.25 g Intravenous 3 times per day  . sevelamer carbonate  800 mg Oral TID WC  . vancomycin  750 mg Intravenous Q T,Th,Sa-HD   Continuous Infusions:   Time spent on care of this patient: 35 min   Amil Moseman, MD 11/17/2014, 1:15 PM  LOS: 1 day   [facility] Office  [phone] Pager - Text Page per [email or web address] If 7PM-7AM, please contact night-coverage [email or web address]

## 2014-11-17 NOTE — Procedures (Signed)
  I was present at this dialysis session, have reviewed the session itself and made  appropriate changes Rob Holmes Hays MD (pgr) [phone]    (c) [phone] 11/17/2014, 10:31 AM    

## 2014-11-17 NOTE — Progress Notes (Signed)
ANTIBIOTIC CONSULT NOTE - INITIAL  Pharmacy Consult for Vancomycin and Zosyn Indication: Sepsis  No Known Allergies  Patient Measurements: Height: 5\' 4"  (162.6 cm) Weight: 134 lb 11.2 oz (61.1 kg) IBW/kg (Calculated) : 54.7  Vital Signs: Temp: 102.8 F (39.3 C) (06/18 0600) Temp Source: Oral (06/18 0600) BP: 129/46 mmHg (06/18 0600) Pulse Rate: 92 (06/18 0600) Intake/Output from previous day: 06/17 0701 - 06/18 0700 In: -  Out: 3 [Urine:1; Stool:2] Intake/Output from this shift:    Labs:  Recent Labs  11/16/14 1358 11/17/14 0453  WBC 7.7 7.6  HGB 11.0* 9.4*  PLT 62* 61*  CREATININE 5.96* 7.50*   Estimated Creatinine Clearance: 4.8 mL/min (by C-G formula based on Cr of 7.5). No results for input(s): VANCOTROUGH, VANCOPEAK, VANCORANDOM, GENTTROUGH, GENTPEAK, GENTRANDOM, TOBRATROUGH, TOBRAPEAK, TOBRARND, AMIKACINPEAK, AMIKACINTROU, AMIKACIN in the last 72 hours.   Microbiology: No results found for this or any previous visit (from the past 720 hour(s)).  Medical History: Past Medical History  Diagnosis Date  . Cough   . Diabetes mellitus     type 2  . GERD (gastroesophageal reflux disease)   . Hypertension   . Bronchitis   . Osteoarthritis   . Dyslipidemia     remote hx/notes 10/06/2009  . ESRD (end stage renal disease) on dialysis     Medications:  Scheduled:  . aspirin EC  81 mg Oral Daily  . atorvastatin  40 mg Oral q1800  . cinacalcet  30 mg Oral Q breakfast  . dorzolamide-timolol  1 drop Both Eyes Daily  . escitalopram  5 mg Oral Daily  . feeding supplement (NEPRO CARB STEADY)  237 mL Oral TID BM  . insulin aspart  0-9 Units Subcutaneous TID WC  . multivitamin  1 tablet Oral QHS  . pantoprazole  80 mg Oral Q1200  . sevelamer carbonate  800 mg Oral TID WC   Infusions:   Assessment: CC: [name] is an 81 y.o. female admitted on 11/16/2014 presenting with confusion, lethargy, and vomiting.  Patient w/ PMH of ESRD - HD TTS.  Dirty UA suggested  UTI, patient admitted for further evaluation and treatment.    Sepsis 2/2 to UTI - WBC wnl, LA 1.78, Tmax 102.8,   6/17 CTX >> 6/18 Vanc >> 6/18 Zosyn >>  6/17 Bld x 2 >>  Goal of Therapy:  Vancomycin trough level 15-20 mcg/ml  Plan:  - Vancomycin 1250 mg IV x 1 LD, followed by 750 mg post HD - Zosyn 2.25 g IV q8H - F/U renal plans - Monitor for clinical efficacy, cultures, and trough levels when appropriate  Red Christians, Pharm. D. Clinical Pharmacy Resident Pager: [phone] Ph: [phone] 11/17/2014 9:42 AM

## 2014-11-17 NOTE — Progress Notes (Signed)
Utilization Review completed. Ronal Maybury RN BSN CM 

## 2014-11-17 NOTE — Consult Note (Signed)
Indication for Consultation:  Management of ESRD/hemodialysis; anemia, hypertension/volume and secondary hyperparathyroidism  HPI: [name] is a 81 y.o. female who was brought to the ED by her family yesterday for evaluation of confusion, lethargy and vomiting. She receives HD TTS @ [facility], last HD Thursday, history of HTN and DM. Per notes pt was confused yesterday morning, she also had episodes of vomiting. Afebrile at home. Family was concerned pt may have UTI as she had similar symtoms with UTI in the past. She was brought to the ED for evaluation. UA +UTI, she was admitted for management. Will arrange HD while admitted  Past Medical History  Diagnosis Date  . Cough   . Diabetes mellitus     type 2  . GERD (gastroesophageal reflux disease)   . Hypertension   . Bronchitis   . Osteoarthritis   . Dyslipidemia     remote hx/notes 10/06/2009  . ESRD (end stage renal disease) on dialysis    Past Surgical History  Procedure Laterality Date  . Eye surgery      Cataract extraction  . Thrombectomy and revision of arterioventous (av) goretex  graft Left 12/2004; 01/2006; 03/12/2009    forearmnotes 10/13/2010; forearmnotes 10/13/2010; upper arm/notes 03/19/2009  . Abdominal hysterectomy      remote hx/notes 10/06/2009  . Vitrectomy Left 03/2001    with membrane peel/notes 10/13/2010  . Arteriovenous graft placement Left 07/2004    forearm/notes 10/13/2010  . Arteriovenous graft placement Left 03/2006    upper armnotes 10/13/2010   Family History  Problem Relation Age of Onset  . Hypertension Mother    Social History:  reports that she has never smoked. She does not have any smokeless tobacco history on file. She reports that she does not drink alcohol or use illicit drugs. No Known Allergies Prior to Admission medications   Medication Sig Start Date End Date Taking? Authorizing Provider  Acetaminophen (TYLENOL EXTRA STRENGTH) 167 MG/5ML LIQD Take 5 mLs by mouth daily as needed. For  pain/fever   Yes Historical Provider, MD  amLODipine (NORVASC) 5 MG tablet Take 2.5 mg by mouth daily. Take half tablet on Sundays mondays wednesdays and fridays 04/05/12  Yes Eulis Foster, FNP  aspirin 81 MG tablet Take 81 mg by mouth daily.   Yes Historical Provider, MD  cinacalcet (SENSIPAR) 30 MG tablet Take 30 mg by mouth daily.   Yes Historical Provider, MD  Dorzolamide HCl-Timolol Mal (COSOPT OP) Place 1 drop into both eyes daily.    Yes Historical Provider, MD  escitalopram (LEXAPRO) 10 MG tablet TAKE ONE-HALF TABLET BY MOUTH ONCE DAILY 02/07/14  Yes Stacie Glaze, MD  folic acid-vitamin b complex-vitamin c-selenium-zinc (DIALYVITE) 3 MG TABS Take 1 tablet by mouth daily.     Yes Historical Provider, MD  loperamide (IMODIUM) 2 MG capsule Take 1 capsule (2 mg total) by mouth as needed for diarrhea or loose stools. 07/28/13  Yes Maryann Mikhail, DO  multivitamin (RENA-VIT) TABS tablet Take 1 tablet by mouth at bedtime. 07/28/13  Yes Maryann Mikhail, DO  NEXIUM 40 MG capsule TAKE 1 CAPSULE EVERY DAY 10/17/12  Yes Stacie Glaze, MD  promethazine (PHENERGAN) 25 MG tablet Take 12.5 mg by mouth every 6 (six) hours as needed for nausea or vomiting.   Yes Historical Provider, MD  quiNINE (QUALAQUIN) 324 MG capsule Take 648 mg by mouth 3 (three) times daily. Take on tuesdays thursdays and saturdays   Yes Historical Provider, MD  sevelamer (RENAGEL) 800 MG tablet  Take 800 mg by mouth 4 (four) times daily.    Yes Historical Provider, MD  simvastatin (ZOCOR) 80 MG tablet TAKE ONE TABLET BY MOUTH EVERY DAY 12/06/13  Yes Gordy Savers, MD   Current Facility-Administered Medications  Medication Dose Route Frequency Provider Last Rate Last Dose  . acetaminophen (TYLENOL) tablet 650 mg  650 mg Oral Q6H PRN Vassie Loll, MD   650 mg at 11/17/14 4098   Or  . acetaminophen (TYLENOL) suppository 650 mg  650 mg Rectal Q6H PRN Vassie Loll, MD      . aspirin EC tablet 81 mg  81 mg Oral Daily Vassie Loll,  MD   81 mg at 11/17/14 0955  . atorvastatin (LIPITOR) tablet 40 mg  40 mg Oral q1800 Vassie Loll, MD   40 mg at 11/16/14 2130  . cinacalcet (SENSIPAR) tablet 30 mg  30 mg Oral Q breakfast Vassie Loll, MD   30 mg at 11/16/14 2130  . dorzolamide-timolol (COSOPT) 22.3-6.8 MG/ML ophthalmic solution 1 drop  1 drop Both Eyes Daily Vassie Loll, MD   1 drop at 11/17/14 0955  . escitalopram (LEXAPRO) tablet 5 mg  5 mg Oral Daily Vassie Loll, MD   5 mg at 11/17/14 0955  . feeding supplement (NEPRO CARB STEADY) liquid 237 mL  237 mL Oral TID BM Vassie Loll, MD   237 mL at 11/17/14 1000  . insulin aspart (novoLOG) injection 0-9 Units  0-9 Units Subcutaneous TID WC Vassie Loll, MD   0 Units at 11/17/14 0800  . loperamide (IMODIUM) capsule 2 mg  2 mg Oral PRN Vassie Loll, MD      . multivitamin (RENA-VIT) tablet 1 tablet  1 tablet Oral QHS Vassie Loll, MD   1 tablet at 11/16/14 2130  . ondansetron (ZOFRAN) tablet 4 mg  4 mg Oral Q6H PRN Vassie Loll, MD       Or  . ondansetron [facility]) injection 4 mg  4 mg Intravenous Q6H PRN Vassie Loll, MD      . pantoprazole (PROTONIX) EC tablet 80 mg  80 mg Oral Q1200 Vassie Loll, MD      . piperacillin-tazobactam (ZOSYN) IVPB 2.25 g  2.25 g Intravenous 3 times per day Wilhemina Bonito, [facility]      . promethazine (PHENERGAN) injection 12.5 mg  12.5 mg Intravenous Q8H PRN Vassie Loll, MD      . sevelamer carbonate (RENVELA) tablet 800 mg  800 mg Oral TID WC Vassie Loll, MD      . vancomycin (VANCOCIN) 1,250 mg in sodium chloride 0.9 % 250 mL IVPB  1,250 mg Intravenous Once Wilhemina Bonito, RPH      . vancomycin (VANCOCIN) IVPB 750 mg/150 ml premix  750 mg Intravenous Q T,Th,Sa-HD Wilhemina Bonito, [facility]       Labs: Basic Metabolic Panel:  Recent Labs Lab 11/16/14 1358 11/16/14 2125 11/17/14 0453  NA 139  --  140  K 3.5  --  3.8  CL 97*  --  99*  CO2 29  --  31  GLUCOSE 115*  --  80  BUN 22*  --  32*  CREATININE 5.96*  --  7.50*  CALCIUM 9.3  --  8.4*   PHOS  --  3.3  --    Liver Function Tests:  Recent Labs Lab 11/16/14 1358  AST 26  ALT 13*  ALKPHOS 72  BILITOT 0.9  PROT 7.6  ALBUMIN 3.5    Recent Labs Lab 11/16/14 1748  LIPASE 35   No results for input(s): AMMONIA in the last 168 hours. CBC:  Recent Labs Lab 11/16/14 1358 11/17/14 0453  WBC 7.7 7.6  HGB 11.0* 9.4*  HCT 33.3* 29.3*  MCV 91.2 92.4  PLT 62* 61*   Cardiac Enzymes: No results for input(s): CKTOTAL, CKMB, CKMBINDEX, TROPONINI in the last 168 hours. CBG:  Recent Labs Lab 11/16/14 2016 11/17/14 0823  GLUCAP 104* 70   Iron Studies: No results for input(s): IRON, TIBC, TRANSFERRIN, FERRITIN in the last 72 hours. Studies/Results: Dg Chest 2 View  11/16/2014   CLINICAL DATA:  Confusion, lethargy and vomiting for 1 day  EXAM: CHEST  2 VIEW  COMPARISON:  Radiograph 07/24/2013  FINDINGS: Large-bore left and right central venous lines noted. No pulmonary edema. No pleural fluid. Cardiac silhouette is normal.  IMPRESSION: No acute cardiopulmonary process.   Electronically Signed   By: Genevive Bi M.D.   On: 11/16/2014 17:57   Ct Head Wo Contrast  11/16/2014   CLINICAL DATA:  Confusion, lethargy, vomiting, diabetes mellitus, hypertension, end-stage renal disease on dialysis  EXAM: CT HEAD WITHOUT CONTRAST  TECHNIQUE: Contiguous axial images were obtained from the base of the skull through the vertex without intravenous contrast.  COMPARISON:  07/07/2010 ; correlation MR brain 07/23/2010  FINDINGS: Generalized atrophy.  Normal ventricular morphology.  No midline shift or mass effect.  Small vessel chronic ischemic changes of deep cerebral white matter.  Asymmetric positioning in gantry.  No gross intracranial hemorrhage, mass lesion, or evidence acute infarction.  Small exostosis arising from the inner table of the RIGHT temporal bone appears unchanged.  Extensive atherosclerotic calcifications at skullbase.  Calcified RIGHT intraorbital mass identified at  inferior RIGHT orbit, 22 x 14 mm unchanged ; this was previously evaluated by MR.  Partial opacification of LEFT ethmoid air cells.  No acute osseous findings.  IMPRESSION: Atrophy with small vessel chronic ischemic changes of deep cerebral white matter.  No acute intracranial abnormalities.  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   Electronically Signed   By: Ulyses Southward M.D.   On: 11/16/2014 17:29   Review of Systems: Pt confused/ Does not know why she is was brought to the hospital No complaints.  Physical Exam: Filed Vitals:   11/16/14 1845 11/16/14 2019 11/16/14 2138 11/17/14 0600  BP: 114/51 116/47 123/46 129/46  Pulse: 92 91 91 92  Temp:  101.8 F (38.8 C) 102.2 F (39 C) 102.8 F (39.3 C)  TempSrc:  Oral Oral Oral  Resp: 18 20 20 20   Height:  5\' 4"  (1.626 m)    Weight:  61.1 kg (134 lb 11.2 oz)    SpO2: 97% 100% 100% 100%     General: Well developed, well nourished, in no acute distress. Resting in bed Head: Normocephalic, atraumatic, sclera non-icteric, mucus membranes are moist Neck: Supple. JVD not elevated. Lungs: Clear bilaterally to auscultation without wheezes, rales, or rhonchi. Breathing is unlabored. Heart: RRR with S1 S2. No murmurs, rubs, or gallops appreciated. Abdomen: Soft, non-tender, non-distended with normoactive bowel sounds. No rebound/guarding. No obvious abdominal masses. M-S:  Strength and tone appear normal for age. Lower extremities:without edema or ischemic changes, no open wounds  Neuro: Alert and oriented to person and place. Moves all extremities spontaneously. Psych:  Responds to questions appropriately with a normal affect. Dialysis Access: L IJ cath/ R AVG +b/t  Dialysis Orders: TTS [facility] 3hr 45 min  62.5kgs     2K/2.25Ca+   Heparin 3500u Profile 2 hectorol 2 micera 100 q 2 week- last dose 6/7  Assessment/Plan: 1.  AMS- likely secondary to UTI,  rocephin given. tmax 102.8. Blood cultures pending. Vanc and zosyn. WBC 7.6 2.  ESRD -  TTS [facility], HD pending today 3.  Hypertension/volume  - under edw, keep even today.129/46  amlodipine on nonHD days 4.  Anemia  - hgb 9.4- last micera given 6/7/ hgb 10.4 on 6/16- start ESA. No Fe. tsat 36.  5.  Metabolic bone disease -  last phos 3.2 and pth 98, cont hectorol- dose was recently decreased. Cont sensipar and renvela 6.  Nutrition - vitamin. Renal diet.  7. DM- per primary  Jetty Duhamel, NP [facility] [phone] 11/17/2014, 10:47 AM   Pt seen, examined and agree w A/P as above. ESRD pt with fevers and confusion. Today is alert and fully oriented. In April she had an infection over her RUE HeRO graft treated with abx.  Her vasc surgeon placed a tunneled HD catn for use while the infection was healing. The graft doesn't show any obvious signs of infection today but bears watching, this could be access infection. Vinson Moselle MD pager [phone]    cell [phone] 11/17/2014, 2:18 PM

## 2014-11-17 NOTE — Evaluation (Signed)
Physical Therapy Evaluation Patient Details Name: [name] MRN: [id number] DOB: November 20, 1928 Today's Date: 11/17/2014   History of Present Illness  [name] is a 81 y.o. female with PMH significant for type 2 diabetes mellitus, ESRD, HTN, HLD and GERD; who was brought by daughter due to increase AMS, nausea and vomiting.  Clinical Impression  Pt admitted with/for AMS, N/V.  Pt currently limited functionally due to the problems listed below.  (see problems list.)  Pt will benefit from PT to maximize function and safety to be able to get home safely with available assist of family.      Follow Up Recommendations Home health PT;Supervision for mobility/OOB;Supervision/Assistance - 24 hour    Equipment Recommendations  None recommended by PT    Recommendations for Other Services       Precautions / Restrictions Precautions Precautions: Fall      Mobility  Bed Mobility Overal bed mobility: Needs Assistance Bed Mobility: Supine to Sit     Supine to sit: Min guard     General bed mobility comments: guard for safety and extra time  Transfers Overall transfer level: Needs assistance Equipment used: Rolling walker (2 wheeled) Transfers: Sit to/from [employer] Sit to Stand: Min assist Stand pivot transfers: Min assist       General transfer comment: cues for hand placement and assist to come forward.  Ambulation/Gait Ambulation/Gait assistance: Min assist Ambulation Distance (Feet): 14 Feet Assistive device: Rolling walker (2 wheeled) Gait Pattern/deviations: Step-through pattern Gait velocity: slower   General Gait Details: short shuffled steps.  Stairs            Wheelchair Mobility    Modified Rankin (Stroke Patients Only)       Balance Overall balance assessment: Needs assistance Sitting-balance support: No upper extremity supported Sitting balance-Leahy Scale: Fair     Standing balance support: Bilateral upper extremity  supported Standing balance-Leahy Scale: Poor                               Pertinent Vitals/Pain Pain Assessment: No/denies pain    Home Living Family/patient expects to be discharged to:: Private residence Living Arrangements: Children (daughter/son) Available Help at Discharge: Family Type of Home: House         Home Equipment: Environmental consultant - 2 wheels      Prior Function Level of Independence: Independent with assistive device(s)               Hand Dominance        Extremity/Trunk Assessment   Upper Extremity Assessment: Overall WFL for tasks assessed           Lower Extremity Assessment: Generalized weakness         Communication   Communication: No difficulties  Cognition Arousal/Alertness: Awake/alert Behavior During Therapy: WFL for tasks assessed/performed Overall Cognitive Status: Within Functional Limits for tasks assessed                      General Comments General comments (skin integrity, edema, etc.): sats 90% on RA and 90 bpm    Exercises        Assessment/Plan    PT Assessment Patient needs continued PT services  PT Diagnosis Generalized weakness   PT Problem List Decreased strength;Decreased activity tolerance;Decreased balance;Decreased mobility  PT Treatment Interventions DME instruction;Gait training;Functional mobility training;Therapeutic activities;Patient/family education;Balance training   PT Goals (Current goals can be found in  the Care Plan section) Acute Rehab PT Goals Patient Stated Goal: home PT Goal Formulation: With patient Time For Goal Achievement: 12/01/14 Potential to Achieve Goals: Good    Frequency Min 3X/week   Barriers to discharge        Co-evaluation               End of Session   Activity Tolerance: Patient tolerated treatment well Patient left: in chair;with call bell/phone within reach Nurse Communication: Mobility status         Time: 1451-1520 PT Time  Calculation (min) (ACUTE ONLY): 29 min   Charges:   PT Evaluation $Initial PT Evaluation Tier I: 1 Procedure PT Treatments $Therapeutic Activity: 8-22 mins   PT G Codes:        Greydon Betke, Eliseo Gum 11/17/2014, 3:44 PM 11/17/2014  Charenton Bing, PT [phone]  (pager)

## 2014-11-18 ENCOUNTER — Inpatient Hospital Stay (HOSPITAL_COMMUNITY): Payer: Medicare Other

## 2014-11-18 DIAGNOSIS — T827XXA Infection and inflammatory reaction due to other cardiac and vascular devices, implants and grafts, initial encounter: Secondary | ICD-10-CM

## 2014-11-18 DIAGNOSIS — L97509 Non-pressure chronic ulcer of other part of unspecified foot with unspecified severity: Secondary | ICD-10-CM

## 2014-11-18 DIAGNOSIS — D696 Thrombocytopenia, unspecified: Secondary | ICD-10-CM

## 2014-11-18 DIAGNOSIS — R8290 Unspecified abnormal findings in urine: Secondary | ICD-10-CM

## 2014-11-18 DIAGNOSIS — A4101 Sepsis due to Methicillin susceptible Staphylococcus aureus: Secondary | ICD-10-CM

## 2014-11-18 DIAGNOSIS — T82898A Other specified complication of vascular prosthetic devices, implants and grafts, initial encounter: Secondary | ICD-10-CM

## 2014-11-18 DIAGNOSIS — E1122 Type 2 diabetes mellitus with diabetic chronic kidney disease: Secondary | ICD-10-CM

## 2014-11-18 DIAGNOSIS — Z992 Dependence on renal dialysis: Secondary | ICD-10-CM

## 2014-11-18 DIAGNOSIS — R509 Fever, unspecified: Secondary | ICD-10-CM

## 2014-11-18 DIAGNOSIS — L97429 Non-pressure chronic ulcer of left heel and midfoot with unspecified severity: Secondary | ICD-10-CM

## 2014-11-18 DIAGNOSIS — E11621 Type 2 diabetes mellitus with foot ulcer: Secondary | ICD-10-CM

## 2014-11-18 DIAGNOSIS — A419 Sepsis, unspecified organism: Secondary | ICD-10-CM

## 2014-11-18 LAB — GLUCOSE, CAPILLARY
Glucose-Capillary: 98 mg/dL (ref 65–99)
Glucose-Capillary: 107 mg/dL — ABNORMAL HIGH (ref 65–99)
Glucose-Capillary: 176 mg/dL — ABNORMAL HIGH (ref 65–99)
Glucose-Capillary: 177 mg/dL — ABNORMAL HIGH (ref 65–99)

## 2014-11-18 LAB — CBC
HEMATOCRIT: 28.7 % — AB (ref 36.0–46.0)
Hemoglobin: 9.2 g/dL — ABNORMAL LOW (ref 12.0–15.0)
MCH: 29.2 pg (ref 26.0–34.0)
MCHC: 32.1 g/dL (ref 30.0–36.0)
MCV: 91.1 fL (ref 78.0–100.0)
Platelets: 45 10*3/uL — ABNORMAL LOW (ref 150–400)
RBC: 3.15 MIL/uL — AB (ref 3.87–5.11)
RDW: 17.9 % — AB (ref 11.5–15.5)
WBC: 6.6 10*3/uL (ref 4.0–10.5)

## 2014-11-18 MED ORDER — FOSFOMYCIN TROMETHAMINE 3 G PO PACK
3.0000 g | PACK | Freq: Once | ORAL | Status: AC
  Administered 2014-11-18: 3 g via ORAL
  Filled 2014-11-18 (×2): qty 3

## 2014-11-18 MED ORDER — POVIDONE-IODINE 7.5 % EX SOLN
CUTANEOUS | Status: DC | PRN
  Filled 2014-11-18: qty 118

## 2014-11-18 MED ORDER — LIDOCAINE-EPINEPHRINE 1 %-1:100000 IJ SOLN
10.0000 mL | Freq: Once | INTRAMUSCULAR | Status: DC
  Filled 2014-11-18: qty 10

## 2014-11-18 NOTE — Progress Notes (Signed)
VASCULAR SURGERY  Left IJ TDC removed at bedside/ betadine/ local. Cath tip clt sent.  Waverly Ferrari, MD, FACS Beeper [phone] Office: [phone]

## 2014-11-18 NOTE — Progress Notes (Signed)
[facility] Progress Note   [name] TKZ:[id number] DOB: 19-Jun-1928 DOA: 11/16/2014 PCP: Carrie Mew, MD  Brief narrative: [name] is a 81 y.o. female end-stage renal disease on hemodialysis, diabetes mellitus type 2, hypertension who was brought in by family for confusion. She was found to have a positive UA. The family stated that she gets confused with urinary tract infections. She was started on Rocephin for UTI. She continued to have fevers overnight in the hospital.   Subjective: She is quite alert and oriented this morning. Aware that she is in the hospital but thinks she was brought in here due to issues with her dialysis access. No complaints of dysuria. No complaints of nausea, vomiting or abdominal pain.  Assessment/Plan: Principal Problem:   Sepsis -With fever as high as 102 and confusion which has now improved (A) UTI-started on ceftriaxone in ER-switched to vancomycin and Zosyn as fevers had not resolved-  culture not sent-have requested a culture but this is after she's been started on antibiotics- will give fosfomycin today- d/c Zosyn due to dropping platelets- d/w ID (B) gram-positive cocci on blood cultures in one set of cultures-vancomycin started- will continue- obtain 2 D ECHO  Continues to be febrile- have asked for ID consult- dialysis cath infection?  Active Problems:     Acute encephalopathy -Due to above and now resolved  N&V (nausea and vomiting) -Resolved-patient tolerating diet    Thrombocytopenia - secondary to sepsis vs HIT vs cephalosporins - avoid heparin- obtain HIT panel- d/c Zosyn  Diet controlled DM   Code Status: full code Family Communication:  DVT prophylaxis: SCDs Consultants:nephrology Procedures:  Antibiotics: Anti-infectives    Start     Dose/Rate Route Frequency Ordered Stop   11/17/14 1200  vancomycin (VANCOCIN) IVPB 750 mg/150 ml premix     750 mg 150 mL/hr over 60 Minutes Intravenous Every  T-Th-Sa (Hemodialysis) 11/17/14 0942     11/17/14 0945  vancomycin (VANCOCIN) 1,250 mg in sodium chloride 0.9 % 250 mL IVPB     1,250 mg 166.7 mL/hr over 90 Minutes Intravenous  Once 11/17/14 0939 11/17/14 1203   11/17/14 0945  piperacillin-tazobactam (ZOSYN) IVPB 2.25 g  Status:  Discontinued     2.25 g 100 mL/hr over 30 Minutes Intravenous 3 times per day 11/17/14 0939 11/18/14 0854   11/16/14 1745  cefTRIAXone (ROCEPHIN) 1 g in dextrose 5 % 50 mL IVPB  Status:  Discontinued     1 g 100 mL/hr over 30 Minutes Intravenous Every 24 hours 11/16/14 1737 11/17/14 0855      Objective: Filed Weights   11/17/14 1821 11/17/14 2125 11/18/14 0459  Weight: 63.2 kg (139 lb 5.3 oz) 63.2 kg (139 lb 5.3 oz) 61.8 kg (136 lb 3.9 oz)    Intake/Output Summary (Last 24 hours) at 11/18/14 0858 Last data filed at 11/18/14 0813  Gross per 24 hour  Intake    460 ml  Output      3 ml  Net    457 ml     Vitals Filed Vitals:   11/17/14 2125 11/17/14 2227 11/17/14 2325 11/18/14 0459  BP: 123/45 154/55  107/40  Pulse: 99 108  88  Temp: 99.5 F (37.5 C) 101 F (38.3 C) 99.7 F (37.6 C) 101.4 F (38.6 C)  TempSrc:    Oral  Resp: 18 16  17   Height:      Weight: 63.2 kg (139 lb 5.3 oz)   61.8 kg (136 lb 3.9 oz)  SpO2:  97% 98%  100%    Exam:  General:  Pt is alert, not in acute distress  HEENT: No icterus, No thrush, oral mucosa moist  Cardiovascular: regular rate and rhythm, S1/S2 No murmur  Respiratory: clear to auscultation bilaterally   Abdomen: Soft, +Bowel sounds, non tender, non distended, no guarding  MSK: No LE edema, cyanosis or clubbing  Data Reviewed: Basic Metabolic Panel:  Recent Labs Lab 11/16/14 1358 11/16/14 2125 11/17/14 0453 11/17/14 2000  NA 139  --  140 133*  K 3.5  --  3.8 3.9  CL 97*  --  99* 96*  CO2 29  --  31 26  GLUCOSE 115*  --  80 76  BUN 22*  --  32* 42*  CREATININE 5.96*  --  7.50* 8.42*  CALCIUM 9.3  --  8.4* 8.1*  MG  --  2.0  --   --   PHOS   --  3.3  --  3.6   Liver Function Tests:  Recent Labs Lab 11/16/14 1358 11/17/14 2000  AST 26  --   ALT 13*  --   ALKPHOS 72  --   BILITOT 0.9  --   PROT 7.6  --   ALBUMIN 3.5 2.9*    Recent Labs Lab 11/16/14 1748  LIPASE 35   No results for input(s): AMMONIA in the last 168 hours. CBC:  Recent Labs Lab 11/16/14 1358 11/17/14 0453 11/17/14 2000 11/18/14 0308  WBC 7.7 7.6 7.5 6.6  HGB 11.0* 9.4* 9.0* 9.2*  HCT 33.3* 29.3* 27.8* 28.7*  MCV 91.2 92.4 90.0 91.1  PLT 62* 61* 54* 45*   Cardiac Enzymes: No results for input(s): CKTOTAL, CKMB, CKMBINDEX, TROPONINI in the last 168 hours. BNP (last 3 results) No results for input(s): BNP in the last 8760 hours.  ProBNP (last 3 results) No results for input(s): PROBNP in the last 8760 hours.  CBG:  Recent Labs Lab 11/16/14 2016 11/17/14 0823 11/17/14 1220 11/17/14 1702 11/18/14 0830  GLUCAP 104* 70 125* 159* 98    Recent Results (from the past 240 hour(s))  Culture, blood (routine x 2)     Status: None (Preliminary result)   Collection Time: 11/16/14 11:24 PM  Result Value Ref Range Status   Specimen Description BLOOD LEFT HAND  Final   Special Requests BOTTLES DRAWN AEROBIC ONLY 3CC  Final   Culture  Setup Time   Final    GRAM POSITIVE COCCI IN CLUSTERS AEROBIC BOTTLE ONLY CRITICAL RESULT CALLED TO, READ BACK BY AND VERIFIED WITH: S JETER 11/17/14 @ 1236 M VESTAL    Culture STAPHYLOCOCCUS AUREUS  Final   Report Status PENDING  Incomplete  Clostridium Difficile by PCR (not at [facility])     Status: None   Collection Time: 11/17/14  5:55 PM  Result Value Ref Range Status   C difficile by pcr NEGATIVE NEGATIVE Final     Studies: Dg Chest 2 View  11/16/2014   CLINICAL DATA:  Confusion, lethargy and vomiting for 1 day  EXAM: CHEST  2 VIEW  COMPARISON:  Radiograph 07/24/2013  FINDINGS: Large-bore left and right central venous lines noted. No pulmonary edema. No pleural fluid. Cardiac silhouette is normal.   IMPRESSION: No acute cardiopulmonary process.   Electronically Signed   By: Genevive Bi M.D.   On: 11/16/2014 17:57   Ct Head Wo Contrast  11/16/2014   CLINICAL DATA:  Confusion, lethargy, vomiting, diabetes mellitus, hypertension, end-stage renal disease on dialysis  EXAM: CT HEAD  WITHOUT CONTRAST  TECHNIQUE: Contiguous axial images were obtained from the base of the skull through the vertex without intravenous contrast.  COMPARISON:  07/07/2010 ; correlation MR brain 07/23/2010  FINDINGS: Generalized atrophy.  Normal ventricular morphology.  No midline shift or mass effect.  Small vessel chronic ischemic changes of deep cerebral white matter.  Asymmetric positioning in gantry.  No gross intracranial hemorrhage, mass lesion, or evidence acute infarction.  Small exostosis arising from the inner table of the RIGHT temporal bone appears unchanged.  Extensive atherosclerotic calcifications at skullbase.  Calcified RIGHT intraorbital mass identified at inferior RIGHT orbit, 22 x 14 mm unchanged ; this was previously evaluated by MR.  Partial opacification of LEFT ethmoid air cells.  No acute osseous findings.  IMPRESSION: Atrophy with small vessel chronic ischemic changes of deep cerebral white matter.  No acute intracranial abnormalities.  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   Electronically Signed   By: Ulyses Southward M.D.   On: 11/16/2014 17:29    Scheduled Meds:  Scheduled Meds: . amLODipine  5 mg Oral Q M,W,F,Su-1800  . aspirin EC  81 mg Oral Daily  . atorvastatin  40 mg Oral q1800  . cinacalcet  30 mg Oral Q breakfast  . darbepoetin (ARANESP) injection - DIALYSIS  100 mcg Intravenous Q Sat-HD  . dorzolamide-timolol  1 drop Both Eyes Daily  . doxercalciferol  2 mcg Intravenous Q T,Th,Sa-HD  . escitalopram  5 mg Oral Daily  . feeding supplement (NEPRO CARB STEADY)  237 mL Oral Q24H  . fosfomycin  3 g Oral  Once  . multivitamin  1 tablet Oral QHS  . pantoprazole  80 mg Oral Q1200  . sevelamer carbonate  800 mg Oral TID WC  . vancomycin  750 mg Intravenous Q T,Th,Sa-HD   Continuous Infusions:   Time spent on care of this patient: 35 min   Hanan Mcwilliams, MD 11/18/2014, 8:58 AM  LOS: 2 days   [facility] Office  [phone] Pager - Text Page per [email or web address] If 7PM-7AM, please contact night-coverage [email or web address]

## 2014-11-18 NOTE — Consult Note (Signed)
Vascular and Vein Specialist of [facility]  Patient name: [name] MRN: [id number] DOB: 1928/11/10 Sex: female  REASON FOR CONSULT: Needs urgent removal of tunneled dialysis catheter. Consult is from Dr. Delano Metz.  HPI: [name] is a 81 y.o. female who was admitted on 11/16/2014 with an altered until status, nausea and vomiting. She was started on intravenous antibiotics. It is difficult to obtain the history from the patient as she is somewhat confused. She had a right upper arm graft placed in [facility] and also had a tunneled dialysis catheter placed in [facility]. She has had a low-grade fever in addition to positive blood cultures. A blood culture on 11/16/2014 had gram-positive cocci in clusters and has grown Staphylococcus aureus. I was asked to remove her tunneled dialysis catheter.  She has no specific complaints although again she is confused.  Past Medical History  Diagnosis Date  . Cough   . Diabetes mellitus     type 2  . GERD (gastroesophageal reflux disease)   . Hypertension   . Bronchitis   . Osteoarthritis   . Dyslipidemia     remote hx/notes 10/06/2009  . ESRD (end stage renal disease) on dialysis     Family History  Problem Relation Age of Onset  . Hypertension Mother     SOCIAL HISTORY: History  Substance Use Topics  . Smoking status: Never Smoker   . Smokeless tobacco: Not on file  . Alcohol Use: No    No Known Allergies  Current Facility-Administered Medications  Medication Dose Route Frequency Provider Last Rate Last Dose  . acetaminophen (TYLENOL) tablet 650 mg  650 mg Oral Q6H PRN Vassie Loll, MD   650 mg at 11/18/14 0636   Or  . acetaminophen (TYLENOL) suppository 650 mg  650 mg Rectal Q6H PRN Vassie Loll, MD      . amLODipine (NORVASC) tablet 5 mg  5 mg Oral Q M,W,F,Su-1800 Jetty Duhamel, NP      . aspirin EC tablet 81 mg  81 mg Oral Daily Vassie Loll, MD   81 mg at 11/18/14 6808  .  atorvastatin (LIPITOR) tablet 40 mg  40 mg Oral q1800 Vassie Loll, MD   40 mg at 11/16/14 2130  . cinacalcet (SENSIPAR) tablet 30 mg  30 mg Oral Q breakfast Vassie Loll, MD   30 mg at 11/18/14 8110  . Darbepoetin Alfa (ARANESP) injection 100 mcg  100 mcg Intravenous Q Sat-HD Jetty Duhamel, NP      . dorzolamide-timolol (COSOPT) 22.3-6.8 MG/ML ophthalmic solution 1 drop  1 drop Both Eyes Daily Vassie Loll, MD   1 drop at 11/18/14 0928  . doxercalciferol (HECTOROL) injection 2 mcg  2 mcg Intravenous Q T,Th,Sa-HD Jetty Duhamel, NP   2 mcg at 11/17/14 1923  . escitalopram (LEXAPRO) tablet 5 mg  5 mg Oral Daily Vassie Loll, MD   5 mg at 11/18/14 3159  . feeding supplement (NEPRO CARB STEADY) liquid 237 mL  237 mL Oral Q24H Jenifer A Williams, RD      . lidocaine-EPINEPHrine (XYLOCAINE W/EPI) 1 %-1:100000 (with pres) injection 10 mL  10 mL Intradermal Once Jetty Duhamel, NP      . loperamide (IMODIUM) capsule 2 mg  2 mg Oral PRN Vassie Loll, MD      . multivitamin (RENA-VIT) tablet 1 tablet  1 tablet Oral QHS Vassie Loll, MD   1 tablet at 11/17/14 2241  . ondansetron (ZOFRAN) tablet 4 mg  4 mg  Oral Q6H PRN Vassie Loll, MD       Or  . ondansetron [facility]) injection 4 mg  4 mg Intravenous Q6H PRN Vassie Loll, MD      . pantoprazole (PROTONIX) EC tablet 80 mg  80 mg Oral Q1200 Vassie Loll, MD   80 mg at 11/17/14 1204  . povidone-iodine (BETADINE) 7.5 % scrub   Topical PRN Jetty Duhamel, NP      . promethazine (PHENERGAN) injection 12.5 mg  12.5 mg Intravenous Q8H PRN Vassie Loll, MD      . sevelamer carbonate (RENVELA) tablet 800 mg  800 mg Oral TID WC Vassie Loll, MD   800 mg at 11/18/14 1610  . vancomycin (VANCOCIN) IVPB 750 mg/150 ml premix  750 mg Intravenous Q T,Th,Sa-HD Wilhemina Bonito, RPH   750 mg at 11/17/14 2003   REVIEW OF SYSTEMS: unable to obtain.  PHYSICAL EXAM: Filed Vitals:   11/17/14 2125 11/17/14 2227 11/17/14 2325 11/18/14 0459  BP: 123/45 154/55  107/40    Pulse: 99 108  88  Temp: 99.5 F (37.5 C) 101 F (38.3 C) 99.7 F (37.6 C) 101.4 F (38.6 C)  TempSrc:    Oral  Resp: Height:      Weight: 139 lb 5.3 oz (63.2 kg)   136 lb 3.9 oz (61.8 kg)  SpO2: 97% 98%  100%   Body mass index is 23.37 kg/(m^2). GENERAL: The patient is a well-nourished female, in no acute distress. The vital signs are documented above. CARDIOVASCULAR: There is a regular rate and rhythm.  PULMONARY: There is good air exchange bilaterally without wheezing or rales. Her right upper arm graft has a good bruit and thrill. There are no obvious signs of infection. There is no drainage and no cellulitis. Her left IJ catheter site looks good with no obvious signs of infection. There is no drainage.  DATA:  Lab Results  Component Value Date   WBC 6.6 11/18/2014   HGB 9.2* 11/18/2014   HCT 28.7* 11/18/2014   MCV 91.1 11/18/2014   PLT 45* 11/18/2014   Lab Results  Component Value Date   NA 133* 11/17/2014   K 3.9 11/17/2014   CL 96* 11/17/2014   CO2 26 11/17/2014   Lab Results  Component Value Date   CREATININE 8.42* 11/17/2014   Lab Results  Component Value Date   INR 1.30 07/25/2013   INR 1.25 07/05/2010   Lab Results  Component Value Date   HGBA1C 5.8 11/10/2013   CBG (last 3)   Recent Labs  11/17/14 1220 11/17/14 1702 11/18/14 0830  GLUCAP 125* 159* 98    MEDICAL ISSUES: Consult for urgent removal of tunneled dialysis catheter by the nephrology service. She has a functioning graft and I think it would be reasonable to remove the catheter although it does not look infected on exam. However she does have positive blood cultures and has had a low-grade fever. She does have significant trouble cytopenia which puts her at increased risk for bleeding with removal of the catheter all things considered I would agree that this is the best approach at this time. The patient is currently being cleaned up and I'll return when the supplies are  available to remove her catheter.  Waverly Ferrari [facility] Vein Specialists of [place]: [phone]

## 2014-11-18 NOTE — Consult Note (Signed)
WOC wound consult note Reason for Consult: Diabetic foot ulcer, however, the ulcer noted to be pressure-stable eschar on the left medial heel (Unstageable Pressure injury) Wound type:Pressure Pressure Ulcer POA: Yes Measurement:1.5cm round with no depth noted due to the presence of dry, stable eschar. Wound GMW:NUUVOZDG by the presence of eschar Drainage (amount, consistency, odor) None Periwound:intact, dry Dressing procedure/placement/frequency: As this is a stable, firmly adherent eschar on the medial heel of a patient with diabetes and arthritis of the feet.  She sees a podiatrist (Dr. Samuella Cota) every three months for toenail debridement and he is following this stable ulcer.  I will add only the once daily application of a betadine swabstick to continue to dry and disinfect this area.  Ideally, this eschar will peel off and reveal intact tissue beneath it. WOC nursing team will not follow, but will remain available to this patient, the nursing and medical team.  Please re-consult if needed. Thanks, Ladona Mow, MSN, RN, GNP, [place], CWON-AP [phone])

## 2014-11-18 NOTE — Progress Notes (Signed)
[facility] Progress Note   Subjective: no complaints, not sure why she is here  Filed Vitals:   11/17/14 2125 11/17/14 2227 11/17/14 2325 11/18/14 0459  BP: 123/45 154/55  107/40  Pulse: 99 108  88  Temp: 99.5 F (37.5 C) 101 F (38.3 C) 99.7 F (37.6 C) 101.4 F (38.6 C)  TempSrc:    Oral  Resp: 18 16  17   Height:      Weight: 63.2 kg (139 lb 5.3 oz)   61.8 kg (136 lb 3.9 oz)  SpO2: 97% 98%  100%   Exam: Alert , no distress No jvd Chest clear bilat RRR no MRG Abd soft ntnd +bs No leg or UE edema Neuro is very HOH, nonfocal, ox 3 RUA HeRO graft no gross sign of infection L chest TDC no drainage  TTS [place]  3.75h  62.5kg   2/2.25 bath  Heparin 3500 P2 hect 2 mircera 100 q 2, last 6/7        Assessment: 1. Fever/ gram + bacteremia - HD cath +/- AVG infection. For now the AVG looks good and we used it yesterday for HD w/o incident. They have been using the graft at  The outpt center too. The AVG was treated in April/ May for 3 wks w abx at HD for "infection". Will get US of the AVG in am to look for any fluid collections and have tunneled cath removed today due to bacteremia.  2. ESRD HD TTS 3. Vol at dry wt 4. HTN controlled, on norvasc 5. MBD cont meds 6. DM per primary  Plan - as above    Vinson Moselle MD  pager [phone]    cell [phone]  11/18/2014, 11:29 AM     Recent Labs Lab 11/16/14 1358 11/16/14 2125 11/17/14 0453 11/17/14 2000  NA 139  --  140 133*  K 3.5  --  3.8 3.9  CL 97*  --  99* 96*  CO2 29  --  31 26  GLUCOSE 115*  --  80 76  BUN 22*  --  32* 42*  CREATININE 5.96*  --  7.50* 8.42*  CALCIUM 9.3  --  8.4* 8.1*  PHOS  --  3.3  --  3.6    Recent Labs Lab 11/16/14 1358 11/17/14 2000  AST 26  --   ALT 13*  --   ALKPHOS 72  --   BILITOT 0.9  --   PROT 7.6  --   ALBUMIN 3.5 2.9*    Recent Labs Lab 11/17/14 0453 11/17/14 2000 11/18/14 0308  WBC 7.6 7.5 6.6  HGB 9.4* 9.0* 9.2*  HCT 29.3* 27.8* 28.7*  MCV  92.4 90.0 91.1  PLT 61* 54* 45*   . amLODipine  5 mg Oral Q M,W,F,Su-1800  . aspirin EC  81 mg Oral Daily  . atorvastatin  40 mg Oral q1800  . cinacalcet  30 mg Oral Q breakfast  . darbepoetin (ARANESP) injection - DIALYSIS  100 mcg Intravenous Q Sat-HD  . dorzolamide-timolol  1 drop Both Eyes Daily  . doxercalciferol  2 mcg Intravenous Q T,Th,Sa-HD  . escitalopram  5 mg Oral Daily  . feeding supplement (NEPRO CARB STEADY)  237 mL Oral Q24H  . multivitamin  1 tablet Oral QHS  . pantoprazole  80 mg Oral Q1200  . sevelamer carbonate  800 mg Oral TID WC  . vancomycin  750 mg Intravenous Q T,Th,Sa-HD     acetaminophen **OR** acetaminophen, loperamide, ondansetron **OR**  ondansetron (ZOFRAN) IV, promethazine

## 2014-11-18 NOTE — Clinical Documentation Improvement (Signed)
Please clarify  Malnutrition diagnosis as related to below conflicting / supporting documentation, if appropriate.   Possible Clinical Conditions? Increased nutritional needs No malnutrition per RD consult Other Condition Cannot clinically determine  Supporting Information: Per 11/17/14 RD assessment = Increased nutrient needs related to chronic illness as evidenced by estimated needs.  Nepro Shake po daily, each supplement provides 425 kcal and 19 grams protein.    Nutrition-Focused physical exam completed. Findings are no fat depletion, no muscle depletion, and no edema.   Per 11/16/14 MD progress note = protein calorie malnutrition: severe -will start Nepro TID -will consult dietitian    Thank You, Shelda Pal ,RN Clinical Documentation Specialist:  [phone]  [facility]- Health Information Management

## 2014-11-18 NOTE — Consult Note (Signed)
[place] for Infectious Disease      Date of Admission:  11/16/2014  Date of Consult:  11/18/2014  Reason for Consult: Fever, staphylococcus aureus bacteremia, HD catheter infection  Referring Physician: Dr. Wynelle Cleveland   HPI: [name] is an 81 y.o. female who had developed apparent infection of her right arm HeRO graft and underwent placement of HD catheter on 09/13/14 in hopes of salvaging the HeRo graft by using IV antibiotics, though I do not know which ones were used. She was admitted with fever and confusion and started on rocephin for possible UTI with her daughter having had concern that pt was having UTI since she has had similar AMS with piror UTI. UA was with mx WBC but pts symptoms could not be assessed due to confusion. Urine culture never sent on admission but blood cultures have returned with 1/2 with Staphylococcus Aureus.   She herself cannot recall any of events leading to her admission.        Past Medical History  Diagnosis Date  . Cough   . Diabetes mellitus     type 2  . GERD (gastroesophageal reflux disease)   . Hypertension   . Bronchitis   . Osteoarthritis   . Dyslipidemia     remote hx/notes 10/06/2009  . ESRD (end stage renal disease) on dialysis     Past Surgical History  Procedure Laterality Date  . Eye surgery      Cataract extraction  . Thrombectomy and revision of arterioventous (av) goretex  graft Left 12/2004; 01/2006; 03/12/2009    forearmnotes 10/13/2010; forearmnotes 10/13/2010; upper arm/notes 03/19/2009  . Abdominal hysterectomy      remote hx/notes 10/06/2009  . Vitrectomy Left 03/2001    with membrane peel/notes 10/13/2010  . Arteriovenous graft placement Left 07/2004    forearm/notes 10/13/2010  . Arteriovenous graft placement Left 03/2006    upper armnotes 10/13/2010  ergies:   No Known Allergies   Medications: I have reviewed patients current medications as documented in Epic Anti-infectives    Start     Dose/Rate  Route Frequency Ordered Stop   11/17/14 1200  vancomycin (VANCOCIN) IVPB 750 mg/150 ml premix     750 mg 150 mL/hr over 60 Minutes Intravenous Every T-Th-Sa (Hemodialysis) 11/17/14 0942     11/17/14 0945  vancomycin (VANCOCIN) 1,250 mg in sodium chloride 0.9 % 250 mL IVPB     1,250 mg 166.7 mL/hr over 90 Minutes Intravenous  Once 11/17/14 0939 11/17/14 1203   11/17/14 0945  piperacillin-tazobactam (ZOSYN) IVPB 2.25 g  Status:  Discontinued     2.25 g 100 mL/hr over 30 Minutes Intravenous 3 times per day 11/17/14 0939 11/18/14 0854   11/16/14 1745  cefTRIAXone (ROCEPHIN) 1 g in dextrose 5 % 50 mL IVPB  Status:  Discontinued     1 g 100 mL/hr over 30 Minutes Intravenous Every 24 hours 11/16/14 1737 11/17/14 0855      Social History:  reports that she has never smoked. She does not have any smokeless tobacco history on file. She reports that she does not drink alcohol or use illicit drugs.  Family History  Problem Relation Age of Onset  . Hypertension Mother     As in HPI and primary teams notes otherwise 12 point review of systems is negative  Blood pressure 107/40, pulse 88, temperature 101.4 F (38.6 C), temperature source Oral, resp. rate 17, height 5' 4"  (1.626 m), weight 136 lb 3.9 oz (61.8 kg), SpO2  100 %. General: Alert and awake, oriented x3, not in any acute distress. HEENT: anicteric sclera, pupils reactive to light and accommodation, EOMI, oropharynx clear and without exudate CVS regular rate, normal r,  no murmur rubs or gallops Chest: clear to auscultation bilaterally, no wheezing, rales or rhonchi HD catheter in place Abdomen: soft nontender, nondistended, normal bowel sounds, Extremities/Skin  She wears glove LUE due to chronically cold hand  Left arm Graft site 11/18/14:      Feet 11/18/14: left heel with nonpurulent scabbed lesion site of prior debridement         Neuro: nonfocal, strength and sensation intact   Results for orders placed or performed  during the hospital encounter of 11/16/14 (from the past 48 hour(s))  Urinalysis, Routine w reflex microscopic (not at [facility])     Status: Abnormal   Collection Time: 11/16/14  4:57 PM  Result Value Ref Range   Color, Urine YELLOW YELLOW   APPearance TURBID (A) CLEAR   Specific Gravity, Urine >1.030 (H) 1.005 - 1.030   pH 8.0 5.0 - 8.0   Glucose, UA NEGATIVE NEGATIVE mg/dL   Hgb urine dipstick MODERATE (A) NEGATIVE   Bilirubin Urine NEGATIVE NEGATIVE   Ketones, ur NEGATIVE NEGATIVE mg/dL   Protein, ur 100 (A) NEGATIVE mg/dL   Urobilinogen, UA 0.2 0.0 - 1.0 mg/dL   Nitrite NEGATIVE NEGATIVE   Leukocytes, UA MODERATE (A) NEGATIVE  Urine microscopic-add on     Status: Abnormal   Collection Time: 11/16/14  4:57 PM  Result Value Ref Range   WBC, UA TOO NUMEROUS TO COUNT <3 WBC/hpf   RBC / HPF 21-50 <3 RBC/hpf   Bacteria, UA MANY (A) RARE   Urine-Other MICROSCOPIC EXAM PERFORMED ON UNCONCENTRATED URINE   Lipase, blood     Status: None   Collection Time: 11/16/14  5:48 PM  Result Value Ref Range   Lipase 35 22 - 51 U/L  I-Stat CG4 Lactic Acid, ED     Status: None   Collection Time: 11/16/14  6:11 PM  Result Value Ref Range   Lactic Acid, Venous 1.78 0.5 - 2.0 mmol/L  Glucose, capillary     Status: Abnormal   Collection Time: 11/16/14  8:16 PM  Result Value Ref Range   Glucose-Capillary 104 (H) 65 - 99 mg/dL   Comment 1 Notify RN    Comment 2 Document in Chart   Magnesium     Status: None   Collection Time: 11/16/14  9:25 PM  Result Value Ref Range   Magnesium 2.0 1.7 - 2.4 mg/dL  Phosphorus     Status: None   Collection Time: 11/16/14  9:25 PM  Result Value Ref Range   Phosphorus 3.3 2.5 - 4.6 mg/dL  TSH     Status: None   Collection Time: 11/16/14  9:25 PM  Result Value Ref Range   TSH 2.119 0.350 - 4.500 uIU/mL  Culture, blood (routine x 2)     Status: None (Preliminary result)   Collection Time: 11/16/14 11:24 PM  Result Value Ref Range   Specimen Description BLOOD LEFT  HAND    Special Requests BOTTLES DRAWN AEROBIC ONLY 3CC    Culture  Setup Time      GRAM POSITIVE COCCI IN CLUSTERS AEROBIC BOTTLE ONLY CRITICAL RESULT CALLED TO, READ BACK BY AND VERIFIED WITH: S JETER 11/17/14 @ 67 M VESTAL    Culture STAPHYLOCOCCUS AUREUS    Report Status PENDING   Culture, blood (routine x 2)  Status: None (Preliminary result)   Collection Time: 11/16/14 11:34 PM  Result Value Ref Range   Specimen Description BLOOD LEFT HAND    Special Requests BOTTLES DRAWN AEROBIC ONLY 5CC    Culture NO GROWTH 1 DAY    Report Status PENDING   Basic metabolic panel     Status: Abnormal   Collection Time: 11/17/14  4:53 AM  Result Value Ref Range   Sodium 140 135 - 145 mmol/L   Potassium 3.8 3.5 - 5.1 mmol/L   Chloride 99 (L) 101 - 111 mmol/L   CO2 31 22 - 32 mmol/L   Glucose, Bld 80 65 - 99 mg/dL   BUN 32 (H) 6 - 20 mg/dL   Creatinine, Ser 7.50 (H) 0.44 - 1.00 mg/dL   Calcium 8.4 (L) 8.9 - 10.3 mg/dL   GFR calc non Af Amer 4 (L) >60 mL/min   GFR calc Af Amer 5 (L) >60 mL/min    Comment: (NOTE) The eGFR has been calculated using the CKD EPI equation. This calculation has not been validated in all clinical situations. eGFR's persistently <60 mL/min signify possible Chronic Kidney Disease.    Anion gap 10 5 - 15  CBC     Status: Abnormal   Collection Time: 11/17/14  4:53 AM  Result Value Ref Range   WBC 7.6 4.0 - 10.5 K/uL   RBC 3.17 (L) 3.87 - 5.11 MIL/uL   Hemoglobin 9.4 (L) 12.0 - 15.0 g/dL   HCT 29.3 (L) 36.0 - 46.0 %   MCV 92.4 78.0 - 100.0 fL   MCH 29.7 26.0 - 34.0 pg   MCHC 32.1 30.0 - 36.0 g/dL   RDW 17.8 (H) 11.5 - 15.5 %   Platelets 61 (L) 150 - 400 K/uL    Comment: CONSISTENT WITH PREVIOUS RESULT  Glucose, capillary     Status: None   Collection Time: 11/17/14  8:23 AM  Result Value Ref Range   Glucose-Capillary 70 65 - 99 mg/dL  Glucose, capillary     Status: Abnormal   Collection Time: 11/17/14 12:20 PM  Result Value Ref Range    Glucose-Capillary 125 (H) 65 - 99 mg/dL  Glucose, capillary     Status: Abnormal   Collection Time: 11/17/14  5:02 PM  Result Value Ref Range   Glucose-Capillary 159 (H) 65 - 99 mg/dL  Clostridium Difficile by PCR (not at [facility])     Status: None   Collection Time: 11/17/14  5:55 PM  Result Value Ref Range   C difficile by pcr NEGATIVE NEGATIVE  Renal function panel     Status: Abnormal   Collection Time: 11/17/14  8:00 PM  Result Value Ref Range   Sodium 133 (L) 135 - 145 mmol/L    Comment: DELTA CHECK NOTED   Potassium 3.9 3.5 - 5.1 mmol/L   Chloride 96 (L) 101 - 111 mmol/L   CO2 26 22 - 32 mmol/L   Glucose, Bld 76 65 - 99 mg/dL   BUN 42 (H) 6 - 20 mg/dL   Creatinine, Ser 8.42 (H) 0.44 - 1.00 mg/dL   Calcium 8.1 (L) 8.9 - 10.3 mg/dL   Phosphorus 3.6 2.5 - 4.6 mg/dL   Albumin 2.9 (L) 3.5 - 5.0 g/dL   GFR calc non Af Amer 4 (L) >60 mL/min   GFR calc Af Amer 4 (L) >60 mL/min    Comment: (NOTE) The eGFR has been calculated using the CKD EPI equation. This calculation has not been validated in all  clinical situations. eGFR's persistently <60 mL/min signify possible Chronic Kidney Disease.    Anion gap 11 5 - 15  CBC     Status: Abnormal   Collection Time: 11/17/14  8:00 PM  Result Value Ref Range   WBC 7.5 4.0 - 10.5 K/uL   RBC 3.09 (L) 3.87 - 5.11 MIL/uL   Hemoglobin 9.0 (L) 12.0 - 15.0 g/dL   HCT 27.8 (L) 36.0 - 46.0 %   MCV 90.0 78.0 - 100.0 fL   MCH 29.1 26.0 - 34.0 pg   MCHC 32.4 30.0 - 36.0 g/dL   RDW 18.0 (H) 11.5 - 15.5 %   Platelets 54 (L) 150 - 400 K/uL    Comment: CONSISTENT WITH PREVIOUS RESULT  CBC     Status: Abnormal   Collection Time: 11/18/14  3:08 AM  Result Value Ref Range   WBC 6.6 4.0 - 10.5 K/uL   RBC 3.15 (L) 3.87 - 5.11 MIL/uL   Hemoglobin 9.2 (L) 12.0 - 15.0 g/dL   HCT 28.7 (L) 36.0 - 46.0 %   MCV 91.1 78.0 - 100.0 fL   MCH 29.2 26.0 - 34.0 pg   MCHC 32.1 30.0 - 36.0 g/dL   RDW 17.9 (H) 11.5 - 15.5 %   Platelets 45 (L) 150 - 400 K/uL     Comment: REPEATED TO VERIFY PLATELET COUNT CONFIRMED BY SMEAR   Glucose, capillary     Status: None   Collection Time: 11/18/14  8:30 AM  Result Value Ref Range   Glucose-Capillary 98 65 - 99 mg/dL  Glucose, capillary     Status: Abnormal   Collection Time: 11/18/14 12:18 PM  Result Value Ref Range   Glucose-Capillary 107 (H) 65 - 99 mg/dL   @BRIEFLABTABLE (sdes,specrequest,cult,reptstatus)   ) Recent Results (from the past 720 hour(s))  Culture, blood (routine x 2)     Status: None (Preliminary result)   Collection Time: 11/16/14 11:24 PM  Result Value Ref Range Status   Specimen Description BLOOD LEFT HAND  Final   Special Requests BOTTLES DRAWN AEROBIC ONLY 3CC  Final   Culture  Setup Time   Final    GRAM POSITIVE COCCI IN CLUSTERS AEROBIC BOTTLE ONLY CRITICAL RESULT CALLED TO, READ BACK BY AND VERIFIED WITH: S JETER 11/17/14 @ 81 M VESTAL    Culture STAPHYLOCOCCUS AUREUS  Final   Report Status PENDING  Incomplete  Culture, blood (routine x 2)     Status: None (Preliminary result)   Collection Time: 11/16/14 11:34 PM  Result Value Ref Range Status   Specimen Description BLOOD LEFT HAND  Final   Special Requests BOTTLES DRAWN AEROBIC ONLY 5CC  Final   Culture NO GROWTH 1 DAY  Final   Report Status PENDING  Incomplete  Clostridium Difficile by PCR (not at [facility])     Status: None   Collection Time: 11/17/14  5:55 PM  Result Value Ref Range Status   C difficile by pcr NEGATIVE NEGATIVE Final     Impression/Recommendation  Principal Problem:   Sepsis Active Problems:   Type 2 diabetes with nephropathy   HLD (hyperlipidemia)   GERD   End stage renal disease   UTI (lower urinary tract infection)   Acute encephalopathy   N&V (nausea and vomiting)   Thrombocytopenia   Dove Gresham Keenum is a 81 y.o. female with  Recent R HeRO graft infection, sp placement of HD cathter by Dr Lucky Cowboy in April admitted with AMS fever and confusion being rx for UTI  but now found to have  Staphylococcus aureus bacteremia. Her course has been complicated by worsening thrombocytopenia but improved cognition  #1 Staphylococcus Aureus bacteremia:  --repeat blood cultures --Thankful to Dr Scot Dock for removal of HD catheter --repeat blood cultures in am --agree with [place] of HeRO graft site and would be helpful to get records of what she was rx with abx wise Will be helpful for [place] to have [facility] assess this site as well  --check TTE  --I would be inclined to treat her for 6 weeks with IV abx using IV vancomycin for now (given her TTPenia problems  --will image left foot with plain films and may need further imaging of Left arm fistula site and foot  #2 HeRO graft infection: see above agree with assessing with [place] and further assessment per VVS recs, getting records of rx record  #3 TTpenia: she was actually TTPenia in February during admission here. I am not sure if that TTpenia and current is related to her beta lactam therapy now (and then she also got rocephin) or not, but agree reasonable to hold beta lactam  #4 ? UTI:  Not clear that she has had this. Urine culture sent on abx, could give her a dose of fosfomycin but would not otherwise further target urine  #5 Diabetic feet: engaged Diabetic Foot Focused Order Set including plain films of the left foot  Dr Linus Salmons is back tomorrow.   11/18/2014, 2:13 PM   Thank you so much for this interesting consult  [place] for [place] [phone] (pager) [phone] (office) 11/18/2014, 2:13 PM  Rhina Brackett [place] 11/18/2014, 2:13 PM

## 2014-11-19 ENCOUNTER — Inpatient Hospital Stay (HOSPITAL_COMMUNITY): Payer: Medicare Other

## 2014-11-19 DIAGNOSIS — I509 Heart failure, unspecified: Secondary | ICD-10-CM

## 2014-11-19 DIAGNOSIS — B999 Unspecified infectious disease: Secondary | ICD-10-CM

## 2014-11-19 DIAGNOSIS — A4101 Sepsis due to Methicillin susceptible Staphylococcus aureus: Principal | ICD-10-CM

## 2014-11-19 DIAGNOSIS — B9561 Methicillin susceptible Staphylococcus aureus infection as the cause of diseases classified elsewhere: Secondary | ICD-10-CM

## 2014-11-19 DIAGNOSIS — M79609 Pain in unspecified limb: Secondary | ICD-10-CM

## 2014-11-19 LAB — CBC
HEMATOCRIT: 26.7 % — AB (ref 36.0–46.0)
HEMOGLOBIN: 8.6 g/dL — AB (ref 12.0–15.0)
MCH: 29.1 pg (ref 26.0–34.0)
MCHC: 32.2 g/dL (ref 30.0–36.0)
MCV: 90.2 fL (ref 78.0–100.0)
Platelets: 60 10*3/uL — ABNORMAL LOW (ref 150–400)
RBC: 2.96 MIL/uL — AB (ref 3.87–5.11)
RDW: 18.1 % — ABNORMAL HIGH (ref 11.5–15.5)
WBC: 6.2 10*3/uL (ref 4.0–10.5)

## 2014-11-19 LAB — BASIC METABOLIC PANEL
Anion gap: 10 (ref 5–15)
BUN: 28 mg/dL — AB (ref 6–20)
CHLORIDE: 97 mmol/L — AB (ref 101–111)
CO2: 27 mmol/L (ref 22–32)
CREATININE: 6.72 mg/dL — AB (ref 0.44–1.00)
Calcium: 8.6 mg/dL — ABNORMAL LOW (ref 8.9–10.3)
GFR calc Af Amer: 6 mL/min — ABNORMAL LOW (ref >60)
GFR calc non Af Amer: 5 mL/min — ABNORMAL LOW (ref >60)
Glucose, Bld: 138 mg/dL — ABNORMAL HIGH (ref 65–99)
Potassium: 3.5 mmol/L (ref 3.5–5.1)
Sodium: 134 mmol/L — ABNORMAL LOW (ref 135–145)

## 2014-11-19 LAB — SEDIMENTATION RATE: Sed Rate: 45 mm/hr — ABNORMAL HIGH (ref 0–22)

## 2014-11-19 LAB — CULTURE, BLOOD (ROUTINE X 2)

## 2014-11-19 LAB — GLUCOSE, CAPILLARY
GLUCOSE-CAPILLARY: 195 mg/dL — AB (ref 65–99)
GLUCOSE-CAPILLARY: 162 mg/dL — AB (ref 65–99)
GLUCOSE-CAPILLARY: 189 mg/dL — AB (ref 65–99)
Glucose-Capillary: 133 mg/dL — ABNORMAL HIGH (ref 65–99)
Glucose-Capillary: 179 mg/dL — ABNORMAL HIGH (ref 65–99)

## 2014-11-19 LAB — HEMOGLOBIN A1C
HEMOGLOBIN A1C: 5.4 % (ref 4.8–5.6)
Mean Plasma Glucose: 108 mg/dL

## 2014-11-19 LAB — HIV ANTIBODY (ROUTINE TESTING W REFLEX): HIV Screen 4th Generation wRfx: NONREACTIVE

## 2014-11-19 LAB — C-REACTIVE PROTEIN: CRP: 19.5 mg/dL — AB (ref <1.0)

## 2014-11-19 LAB — PREALBUMIN: PREALBUMIN: 16.2 mg/dL — AB (ref 18–38)

## 2014-11-19 NOTE — Progress Notes (Signed)
[facility]  Date of Admission:  11/16/2014  Antibiotics: Vancomycin after dialysis  Subjective: No complaints, feels she is better  Objective: Temp:  [98.5 F (36.9 C)-99.2 F (37.3 C)] 99.2 F (37.3 C) (06/20 0849) Pulse Rate:  [73-77] 77 (06/20 0849) Resp:  [17-18] 17 (06/20 0849) BP: (107-131)/(40-52) 128/48 mmHg (06/20 0849) SpO2:  [95 %-100 %] 95 % (06/20 0849)  General: awake, alert, nad Skin: no rashes Lungs: CTA B Cor: RRR Chest: left  Catheter removed, non tender  Lab Results Lab Results  Component Value Date   WBC 6.2 11/19/2014   HGB 8.6* 11/19/2014   HCT 26.7* 11/19/2014   MCV 90.2 11/19/2014   PLT PENDING 11/19/2014    Lab Results  Component Value Date   CREATININE 6.72* 11/19/2014   BUN 28* 11/19/2014   NA 134* 11/19/2014   K 3.5 11/19/2014   CL 97* 11/19/2014   CO2 27 11/19/2014    Lab Results  Component Value Date   ALT 13* 11/16/2014   AST 26 11/16/2014   ALKPHOS 72 11/16/2014   BILITOT 0.9 11/16/2014      Microbiology: Recent Results (from the past 240 hour(s))  Culture, blood (routine x 2)     Status: None (Preliminary result)   Collection Time: 11/16/14 11:24 PM  Result Value Ref Range Status   Specimen Description BLOOD LEFT HAND  Final   Special Requests BOTTLES DRAWN AEROBIC ONLY 3CC  Final   Culture  Setup Time   Final    GRAM POSITIVE COCCI IN CLUSTERS AEROBIC BOTTLE ONLY CRITICAL RESULT CALLED TO, READ BACK BY AND VERIFIED WITH: S JETER 11/17/14 @ 1236 M VESTAL    Culture STAPHYLOCOCCUS AUREUS  Final   Report Status PENDING  Incomplete  Culture, blood (routine x 2)     Status: None (Preliminary result)   Collection Time: 11/16/14 11:34 PM  Result Value Ref Range Status   Specimen Description BLOOD LEFT HAND  Final   Special Requests BOTTLES DRAWN AEROBIC ONLY 5CC  Final   Culture NO GROWTH 1 DAY  Final   Report Status PENDING  Incomplete  Clostridium Difficile by PCR (not at [facility])      Status: None   Collection Time: 11/17/14  5:55 PM  Result Value Ref Range Status   C difficile by pcr NEGATIVE NEGATIVE Final    Studies/Results: Dg Foot Complete Left  11/18/2014   CLINICAL DATA:  Diabetic foot ulcer  EXAM: LEFT FOOT - COMPLETE 3+ VIEW  COMPARISON:  None.  FINDINGS: Bones are markedly subjectively osteopenic. Deformity of the fourth and fifth metatarsals is most compatible with remote healed fracture. No acute fracture or dislocation is evident. Possible clothing artifact or radiopaque foreign body noted over the lower leg, correlate clinically.  IMPRESSION: Allowing for marked osteopenia, which could mask a subtle fracture, no fracture or dislocation is identified. If symptoms continue, consider repeat imaging in 7-10 days or MRI with contrast if osteomyelitis is in question.   Electronically Signed   By: Christiana Pellant M.D.   On: 11/18/2014 17:44    Assessment/Plan:  1) Staph aureus bacteremia - 1/2.  Sensitivities pending.  TTE pending.   Will need 6 weeks of either cefazolin or vancomycin, depending on cultures, through July 30th -TEE not indicated unless concerns on TTE.  Will get prolonged course regardless.     Staci Righter, MD [facility] [email or web address] [phone] pager   [phone] cell  11/19/2014, 10:22 AM

## 2014-11-19 NOTE — Progress Notes (Signed)
Nutrition Follow-up  DOCUMENTATION CODES:  Not applicable  INTERVENTION:  Nepro shake, MVI  NUTRITION DIAGNOSIS:  Increased nutrient needs related to wound healing as evidenced by estimated needs.  Ongoing  GOAL:  Patient will meet greater than or equal to 90% of their needs  Progressing  MONITOR:  PO intake, Supplement acceptance, Skin, Weight trends, Labs, I & O's  REASON FOR ASSESSMENT:  Consult Wound healing  ASSESSMENT: SOFIYA ASHBRIDGE is a 81 y.o. female with PMH significant for type 2 diabetes mellitus, ESRD, HTN, HLD and GERD; who was brought by daughter due to increase AMS, nausea and vomiting. Patient's daughter reports symptoms started approx 3 days prior to admission. Patient was unable to keep anything down and patient expressed same behavior in the past with UTI according to daughter.  RD completed initial assessment on 11/17/14.   Reviewed [facility] note from 11/18/14; pt with unstageable DM foot ulcer on lt heel. Noted that this was not documented in chart when RD assessed initially.   Pt was sleeping soundly at time of visit. Intake variable; PO: 25-75%. Pt was Nepro supplement and Renal WVI already ordered; RD will continue to support increased needs for wound healing.   Height:  Ht Readings from Last 1 Encounters:  11/16/14 5\' 4"  (1.626 m)    Weight:  Wt Readings from Last 1 Encounters:  11/18/14 136 lb 3.9 oz (61.8 kg)    Ideal Body Weight:  54.5 kg  Wt Readings from Last 10 Encounters:  11/18/14 136 lb 3.9 oz (61.8 kg)  11/10/13 133 lb (60.328 kg)  07/28/13 136 lb 7.4 oz (61.9 kg)  07/21/13 144 lb (65.318 kg)  05/12/13 144 lb (65.318 kg)  01/09/13 147 lb (66.679 kg)  07/18/12 147 lb (66.679 kg)  01/06/12 139 lb (63.05 kg)  09/02/11 147 lb (66.679 kg)  05/01/11 147 lb (66.679 kg)    BMI:  Body mass index is 23.37 kg/(m^2).  Estimated Nutritional Needs:  Kcal:  1900-2100  Protein:  90-100 grams  Fluid:  >1.5 L  Skin:  Wound (see  comment) (unstageable pressure ulcer on lt heel)  Diet Order:  Diet renal/carb modified with fluid restriction Diet-HS Snack?: Nothing; Room service appropriate?: Yes; Fluid consistency:: Thin  EDUCATION NEEDS:  Education needs addressed   Intake/Output Summary (Last 24 hours) at 11/19/14 1534 Last data filed at 11/19/14 1235  Gross per 24 hour  Intake    490 ml  Output      2 ml  Net    488 ml    Last BM:  11/19/14  Lonell Stamos A. Mayford Knife, RD, LDN, CDE Pager: [phone] After hours Pager: [phone]

## 2014-11-19 NOTE — Progress Notes (Signed)
PT Cancellation Note  Patient Details Name: JERONDA TASSIN MRN: [id number] DOB: 1928/10/05   Cancelled Treatment:    Reason Eval/Treat Not Completed: Medical issues which prohibited therapy;Other (comment) (Has continual diarrhea and cannot walk)   Ivar Drape 11/19/2014, 3:04 PM   Samul Dada, PT MS Acute Rehab Dept. Number: [facility] [id number] and [facility] [phone]

## 2014-11-19 NOTE — Progress Notes (Signed)
VASCULAR LAB PRELIMINARY  ARTERIAL  ABI completed:ABIs indicate adequate arterial flow to the bilateral lower extremities.     RIGHT    LEFT    PRESSURE WAVEFORM  PRESSURE WAVEFORM  BRACHIAL graft  BRACHIAL 119 B  DP   DP    AT 107 M AT 117 M  PT 98 M PT 99 M  PER   PER    GREAT TOE  NA GREAT TOE  NA    RIGHT LEFT  ABI 0.90 0.98     Vuong Musa, RVT 11/19/2014, 4:45 PM

## 2014-11-19 NOTE — Progress Notes (Signed)
[facility] Progress Note   [name] ZOX:[id number] DOB: 03-31-1929 DOA: 11/16/2014 PCP: Carrie Mew, MD  Brief narrative: [name] is a 81 y.o. female end-stage renal disease on hemodialysis, diabetes mellitus type 2, hypertension who was brought in by family for confusion. She was found to have a positive UA. The family stated that she gets confused with urinary tract infections. She was started on Rocephin for UTI. She continued to have fevers overnight in the hospital.   Subjective: She has no complaints. Eating, drinking well. No diarrhea.   Assessment/Plan: Principal Problem:   Sepsis -With fever as high as 102 and confusion which has now improved (A) UTI-started on ceftriaxone in ER-switched to vancomycin and Zosyn as fevers had not resolved-  culture not sent-have requested a culture but this is after she's been started on antibiotics- will give fosfomycin today- d/c Zosyn due to dropping platelets- d/w ID (B) gram-positive cocci on blood cultures in one set of cultures-vancomycin started- will continue- obtain 2 D ECHO   have asked for ID consult- dialysis cath removed on 6/19- fever curve improved- per ID, she will need 6 wks of Vancomycin - f/u xray of foot which as a necrotic area on the heel- severe osteopenia- film and report reviewed- severe osteopenia- otherwise unrevealing  - ultrasound of right arm graft today  Active Problems:     Acute encephalopathy -Due to above and now resolved  N&V (nausea and vomiting) -Resolved-patient tolerating diet  HTN - cont Norvasc    Thrombocytopenia - secondary to sepsis vs HIT vs cephalosporins - avoid heparin- obtain HIT panel- platelet count pending today  Diet controlled DM   Code Status: full code Family Communication:  DVT prophylaxis: SCDs Consultants:nephrology, ID, vascular sx Procedures:removal of dialysis cath  Antibiotics: Anti-infectives    Start     Dose/Rate Route Frequency  Ordered Stop   11/17/14 1200  vancomycin (VANCOCIN) IVPB 750 mg/150 ml premix  Status:  Discontinued     750 mg 150 mL/hr over 60 Minutes Intravenous Every T-Th-Sa (Hemodialysis) 11/17/14 0942 11/19/14 0857   11/17/14 0945  vancomycin (VANCOCIN) 1,250 mg in sodium chloride 0.9 % 250 mL IVPB     1,250 mg 166.7 mL/hr over 90 Minutes Intravenous  Once 11/17/14 0939 11/17/14 1203   11/17/14 0945  piperacillin-tazobactam (ZOSYN) IVPB 2.25 g  Status:  Discontinued     2.25 g 100 mL/hr over 30 Minutes Intravenous 3 times per day 11/17/14 0939 11/18/14 0854   11/16/14 1745  cefTRIAXone (ROCEPHIN) 1 g in dextrose 5 % 50 mL IVPB  Status:  Discontinued     1 g 100 mL/hr over 30 Minutes Intravenous Every 24 hours 11/16/14 1737 11/17/14 0855      Objective: Filed Weights   11/17/14 1821 11/17/14 2125 11/18/14 0459  Weight: 63.2 kg (139 lb 5.3 oz) 63.2 kg (139 lb 5.3 oz) 61.8 kg (136 lb 3.9 oz)    Intake/Output Summary (Last 24 hours) at 11/19/14 0957 Last data filed at 11/19/14 0753  Gross per 24 hour  Intake    890 ml  Output      7 ml  Net    883 ml     Vitals Filed Vitals:   11/18/14 0459 11/18/14 2123 11/19/14 0538 11/19/14 0849  BP: 107/40 107/40 131/52 128/48  Pulse: 88 73 77 77  Temp: 101.4 F (38.6 C) 98.5 F (36.9 C) 99.2 F (37.3 C) 99.2 F (37.3 C)  TempSrc: Oral Oral Oral Oral  Resp:  17  18 17   Height:      Weight: 61.8 kg (136 lb 3.9 oz)     SpO2: 100% 99% 100% 95%    Exam:  General:  Pt is alert, not in acute distress  HEENT: No icterus, No thrush, oral mucosa moist  Cardiovascular: regular rate and rhythm, S1/S2 No murmur  Respiratory: clear to auscultation bilaterally   Abdomen: Soft, +Bowel sounds, non tender, non distended, no guarding  MSK: No LE edema, cyanosis or clubbing  Data Reviewed: Basic Metabolic Panel:  Recent Labs Lab 11/16/14 1358 11/16/14 2125 11/17/14 0453 11/17/14 2000 11/19/14 0625  NA 139  --  140 133* 134*  K 3.5  --  3.8  3.9 3.5  CL 97*  --  99* 96* 97*  CO2 29  --  31 26 27   GLUCOSE 115*  --  80 76 138*  BUN 22*  --  32* 42* 28*  CREATININE 5.96*  --  7.50* 8.42* 6.72*  CALCIUM 9.3  --  8.4* 8.1* 8.6*  MG  --  2.0  --   --   --   PHOS  --  3.3  --  3.6  --    Liver Function Tests:  Recent Labs Lab 11/16/14 1358 11/17/14 2000  AST 26  --   ALT 13*  --   ALKPHOS 72  --   BILITOT 0.9  --   PROT 7.6  --   ALBUMIN 3.5 2.9*    Recent Labs Lab 11/16/14 1748  LIPASE 35   No results for input(s): AMMONIA in the last 168 hours. CBC:  Recent Labs Lab 11/16/14 1358 11/17/14 0453 11/17/14 2000 11/18/14 0308 11/19/14 0625  WBC 7.7 7.6 7.5 6.6 6.2  HGB 11.0* 9.4* 9.0* 9.2* 8.6*  HCT 33.3* 29.3* 27.8* 28.7* 26.7*  MCV 91.2 92.4 90.0 91.1 90.2  PLT 62* 61* 54* 45* PENDING   Cardiac Enzymes: No results for input(s): CKTOTAL, CKMB, CKMBINDEX, TROPONINI in the last 168 hours. BNP (last 3 results) No results for input(s): BNP in the last 8760 hours.  ProBNP (last 3 results) No results for input(s): PROBNP in the last 8760 hours.  CBG:  Recent Labs Lab 11/18/14 0830 11/18/14 1218 11/18/14 1737 11/18/14 2120 11/19/14 0809  GLUCAP 98 107* 176* 177* 133*    Recent Results (from the past 240 hour(s))  Culture, blood (routine x 2)     Status: None (Preliminary result)   Collection Time: 11/16/14 11:24 PM  Result Value Ref Range Status   Specimen Description BLOOD LEFT HAND  Final   Special Requests BOTTLES DRAWN AEROBIC ONLY 3CC  Final   Culture  Setup Time   Final    GRAM POSITIVE COCCI IN CLUSTERS AEROBIC BOTTLE ONLY CRITICAL RESULT CALLED TO, READ BACK BY AND VERIFIED WITH: S JETER 11/17/14 @ 1236 M VESTAL    Culture STAPHYLOCOCCUS AUREUS  Final   Report Status PENDING  Incomplete  Culture, blood (routine x 2)     Status: None (Preliminary result)   Collection Time: 11/16/14 11:34 PM  Result Value Ref Range Status   Specimen Description BLOOD LEFT HAND  Final   Special  Requests BOTTLES DRAWN AEROBIC ONLY 5CC  Final   Culture NO GROWTH 1 DAY  Final   Report Status PENDING  Incomplete  Clostridium Difficile by PCR (not at [facility])     Status: None   Collection Time: 11/17/14  5:55 PM  Result Value Ref Range Status   C difficile  by pcr NEGATIVE NEGATIVE Final     Studies: Dg Foot Complete Left  11/18/2014   CLINICAL DATA:  Diabetic foot ulcer  EXAM: LEFT FOOT - COMPLETE 3+ VIEW  COMPARISON:  None.  FINDINGS: Bones are markedly subjectively osteopenic. Deformity of the fourth and fifth metatarsals is most compatible with remote healed fracture. No acute fracture or dislocation is evident. Possible clothing artifact or radiopaque foreign body noted over the lower leg, correlate clinically.  IMPRESSION: Allowing for marked osteopenia, which could mask a subtle fracture, no fracture or dislocation is identified. If symptoms continue, consider repeat imaging in 7-10 days or MRI with contrast if osteomyelitis is in question.   Electronically Signed   By: Christiana Pellant M.D.   On: 11/18/2014 17:44    Scheduled Meds:  Scheduled Meds: . amLODipine  5 mg Oral Q M,W,F,Su-1800  . aspirin EC  81 mg Oral Daily  . atorvastatin  40 mg Oral q1800  . cinacalcet  30 mg Oral Q breakfast  . darbepoetin (ARANESP) injection - DIALYSIS  100 mcg Intravenous Q Sat-HD  . dorzolamide-timolol  1 drop Both Eyes Daily  . doxercalciferol  2 mcg Intravenous Q T,Th,Sa-HD  . escitalopram  5 mg Oral Daily  . feeding supplement (NEPRO CARB STEADY)  237 mL Oral Q24H  . lidocaine-EPINEPHrine  10 mL Intradermal Once  . multivitamin  1 tablet Oral QHS  . pantoprazole  80 mg Oral Q1200  . sevelamer carbonate  800 mg Oral TID WC   Continuous Infusions:   Time spent on care of this patient: 35 min   Einer Meals, MD 11/19/2014, 9:57 AM  LOS: 3 days   [facility] Office  [phone] Pager - Text Page per [email or web address] If 7PM-7AM, please contact night-coverage  [email or web address]

## 2014-11-19 NOTE — Progress Notes (Signed)
VASCULAR LAB PRELIMINARY  PRELIMINARY  PRELIMINARY  PRELIMINARY  Right upper extremity duplex of dialysis access completed.    Preliminary report:  There does not appear to be any abnormal fluid collection around graft.  Flow is normal.    Criselda Starke, RVT 11/19/2014, 4:47 PM

## 2014-11-19 NOTE — Progress Notes (Signed)
  Echocardiogram 2D Echocardiogram has been performed.  [name] 11/19/2014, 9:49 AM

## 2014-11-19 NOTE — Progress Notes (Signed)
Subjective:  No cos "when am I going home"?   Objective Vital signs in last 24 hours: Filed Vitals:   11/18/14 0459 11/18/14 2123 11/19/14 0538 11/19/14 0849  BP: 107/40 107/40 131/52 128/48  Pulse: 88 73 77 77  Temp: 101.4 F (38.6 C) 98.5 F (36.9 C) 99.2 F (37.3 C) 99.2 F (37.3 C)  TempSrc: Oral Oral Oral Oral  Resp: Height:      Weight: 61.8 kg (136 lb 3.9 oz)     SpO2: 100% 99% 100% 95%   Weight change:   Physical Exam: General: Alert , NAD , Appropriate , nl MS Heart: RR no MRG Lungs: CTA Abdomen: soft, NT, ND  Extremities: no pedal edema  Dialysis Access: pos bruit R U A Hero With bandage not removed for the most part HERO looks good- 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 Staph aureus bacteremia - HD cath +/- AVG infection.  ID seeing TTE pending- seems negative for veg   Noted 6 weeks antibiotics needed  Through July 30th/ perm cath out yesterday/using the graft  In hosp and op center.   2. ESRD HD TTS via HERO on schedule  3.5 k next HD tomorrow 3. Vol/ HTN= bp 128/48  On Norvasc/ at dry wt 4. MBD =cont meds/ ca phos stable- on renvela and hectorol and sensipar 5. Anemia = hgb = 8.6/ continue ESA- is dropping with hosp 6. DM per primary 7. Dispo- I think reasonable to keep for one more HD treatment via HERO graft and if goes well can continue with ABX as OP at the kidney center  Lenny Pastel, PA-C [facility] [phone] 11/19/2014,9:23 AM  LOS: 3 days    Patient seen and examined, agree with above note with above modifications. Feels well.  Low grade temp- WBC normal- awaiting sensitivities on staph in blood- HERO does not appear grossly infected.  Plan on HD in AM on schedule and if tolerates well may be able to go- continue abx long term at the kidney center  Annie Sable,  MD 11/19/2014     Labs: Basic Metabolic Panel:  Recent Labs Lab 11/16/14 2125 11/17/14 0453 11/17/14 2000 11/19/14 0625  NA  --  140 133* 134*  K  --  3.8 3.9 3.5  CL  --  99* 96* 97*  CO2  --  GLUCOSE  --  80 76 138*  BUN  --  32* 42* 28*  CREATININE  --  7.50* 8.42* 6.72*  CALCIUM  --  8.4* 8.1* 8.6*  PHOS 3.3  --  3.6  --    Liver Function Tests:  Recent Labs Lab 11/16/14 1358 11/17/14 2000  AST 26  --   ALT 13*  --   ALKPHOS 72  --   BILITOT 0.9  --   PROT 7.6  --   ALBUMIN 3.5 2.9*    Recent Labs Lab 11/16/14 1748  LIPASE 35   No results for input(s): AMMONIA in the last 168 hours. CBC:  Recent Labs Lab 11/16/14 1358 11/17/14 0453 11/17/14 2000 11/18/14 0308 11/19/14 0625  WBC 7.7 7.6 7.5 6.6 6.2  HGB 11.0* 9.4* 9.0* 9.2* 8.6*  HCT 33.3* 29.3* 27.8* 28.7* 26.7*  MCV 91.2 92.4 90.0 91.1 90.2  PLT 62* 61*  54* 45* PENDING   Cardiac Enzymes: No results for input(s): CKTOTAL, CKMB, CKMBINDEX, TROPONINI in the last 168 hours. CBG:  Recent Labs Lab 11/18/14 0830 11/18/14 1218 11/18/14 1737 11/18/14 2120 11/19/14 0809  GLUCAP 98 107* 176* 177* 133*    Studies/Results: Dg Foot Complete Left  11/18/2014   CLINICAL DATA:  Diabetic foot ulcer  EXAM: LEFT FOOT - COMPLETE 3+ VIEW  COMPARISON:  None.  FINDINGS: Bones are markedly subjectively osteopenic. Deformity of the fourth and fifth metatarsals is most compatible with remote healed fracture. No acute fracture or dislocation is evident. Possible clothing artifact or radiopaque foreign body noted over the lower leg, correlate clinically.  IMPRESSION: Allowing for marked osteopenia, which could mask a subtle fracture, no fracture or dislocation is identified. If symptoms continue, consider repeat imaging in 7-10 days or MRI with contrast if osteomyelitis is in question.   Electronically Signed   By: Christiana Pellant M.D.   On: 11/18/2014 17:44   Medications:   . amLODipine  5 mg Oral Q  M,W,F,Su-1800  . aspirin EC  81 mg Oral Daily  . atorvastatin  40 mg Oral q1800  . cinacalcet  30 mg Oral Q breakfast  . darbepoetin (ARANESP) injection - DIALYSIS  100 mcg Intravenous Q Sat-HD  . dorzolamide-timolol  1 drop Both Eyes Daily  . doxercalciferol  2 mcg Intravenous Q T,Th,Sa-HD  . escitalopram  5 mg Oral Daily  . feeding supplement (NEPRO CARB STEADY)  237 mL Oral Q24H  . lidocaine-EPINEPHrine  10 mL Intradermal Once  . multivitamin  1 tablet Oral QHS  . pantoprazole  80 mg Oral Q1200  . sevelamer carbonate  800 mg Oral TID WC

## 2014-11-20 DIAGNOSIS — L8990 Pressure ulcer of unspecified site, unspecified stage: Secondary | ICD-10-CM | POA: Insufficient documentation

## 2014-11-20 LAB — CBC
HEMATOCRIT: 28.9 % — AB (ref 36.0–46.0)
HEMOGLOBIN: 9 g/dL — AB (ref 12.0–15.0)
MCH: 28.6 pg (ref 26.0–34.0)
MCHC: 31.1 g/dL (ref 30.0–36.0)
MCV: 91.7 fL (ref 78.0–100.0)
Platelets: DECREASED 10*3/uL (ref 150–400)
RBC: 3.15 MIL/uL — ABNORMAL LOW (ref 3.87–5.11)
RDW: 18.6 % — ABNORMAL HIGH (ref 11.5–15.5)
WBC: 6.4 10*3/uL (ref 4.0–10.5)

## 2014-11-20 LAB — RENAL FUNCTION PANEL
ANION GAP: 9 (ref 5–15)
Albumin: 2.6 g/dL — ABNORMAL LOW (ref 3.5–5.0)
BUN: 33 mg/dL — AB (ref 6–20)
CHLORIDE: 100 mmol/L — AB (ref 101–111)
CO2: 25 mmol/L (ref 22–32)
CREATININE: 8.36 mg/dL — AB (ref 0.44–1.00)
Calcium: 9 mg/dL (ref 8.9–10.3)
GFR calc Af Amer: 4 mL/min — ABNORMAL LOW (ref >60)
GFR calc non Af Amer: 4 mL/min — ABNORMAL LOW (ref >60)
Glucose, Bld: 137 mg/dL — ABNORMAL HIGH (ref 65–99)
POTASSIUM: 3.4 mmol/L — AB (ref 3.5–5.1)
Phosphorus: 1.3 mg/dL — ABNORMAL LOW (ref 2.5–4.6)
SODIUM: 134 mmol/L — AB (ref 135–145)

## 2014-11-20 LAB — VANCOMYCIN, RANDOM: Vancomycin Rm: 17 ug/mL

## 2014-11-20 LAB — HEMOGLOBIN A1C
HEMOGLOBIN A1C: 5.3 % (ref 4.8–5.6)
Mean Plasma Glucose: 105 mg/dL

## 2014-11-20 LAB — GLUCOSE, CAPILLARY: GLUCOSE-CAPILLARY: 113 mg/dL — AB (ref 65–99)

## 2014-11-20 MED ORDER — CEFAZOLIN SODIUM-DEXTROSE 2-3 GM-% IV SOLR
2.0000 g | INTRAVENOUS | Status: DC
  Administered 2014-11-20: 2 g via INTRAVENOUS
  Filled 2014-11-20: qty 50

## 2014-11-20 MED ORDER — VANCOMYCIN HCL IN DEXTROSE 750-5 MG/150ML-% IV SOLN
750.0000 mg | Freq: Once | INTRAVENOUS | Status: DC
  Administered 2014-11-20: 750 mg via INTRAVENOUS
  Filled 2014-11-20: qty 150

## 2014-11-20 MED ORDER — FAMOTIDINE 20 MG PO TABS: 20.0000 mg | ORAL_TABLET | Freq: Two times a day (BID) | ORAL | Status: DC

## 2014-11-20 MED ORDER — HEPARIN SODIUM (PORCINE) 1000 UNIT/ML DIALYSIS: 20.0000 [IU]/kg | INTRAMUSCULAR | Status: DC | PRN

## 2014-11-20 NOTE — Progress Notes (Signed)
Pt discharging to home with daughter, Discharge instructions and education given to pt and daughter. IV Dc'd. Vitals stable. To vehicle via W/C. 11/20/2014 4:51 PM [name]

## 2014-11-20 NOTE — Progress Notes (Addendum)
ANTIBIOTIC CONSULT NOTE  Pharmacy Consult for Vancomycin Indication: Staph aureus bacteremia  No Known Allergies  Patient Measurements: Height: 5\' 4"  (162.6 cm) Weight: 134 lb 4.2 oz (60.9 kg) IBW/kg (Calculated) : 54.7  Vital Signs: Temp: 98.5 F (36.9 C) (06/21 0759) Temp Source: Oral (06/21 0759) BP: 131/60 mmHg (06/21 0818) Pulse Rate: 69 (06/21 0818) Intake/Output from previous day: 06/20 0701 - 06/21 0700 In: 780 [P.O.:780] Out: -  Intake/Output from this shift:    Labs:  Recent Labs  11/17/14 2000 11/18/14 0308 11/19/14 0625 11/20/14 0620  WBC 7.5 6.6 6.2 6.4  HGB 9.0* 9.2* 8.6* 9.0*  PLT 54* 45* 60* PLATELET CLUMPS NOTED ON SMEAR, COUNT APPEARS DECREASED  CREATININE 8.42*  --  6.72*  --    Estimated Creatinine Clearance: 5.4 mL/min (by C-G formula based on Cr of 6.72).  Recent Labs  11/20/14 0620  [facility] 17     Microbiology: Recent Results (from the past 720 hour(s))  Culture, blood (routine x 2)     Status: None   Collection Time: 11/16/14 11:24 PM  Result Value Ref Range Status   Specimen Description BLOOD LEFT HAND  Final   Special Requests BOTTLES DRAWN AEROBIC ONLY 3CC  Final   Culture  Setup Time   Final    GRAM POSITIVE COCCI IN CLUSTERS AEROBIC BOTTLE ONLY CRITICAL RESULT CALLED TO, READ BACK BY AND VERIFIED WITH: S JETER 11/17/14 @ 1236 M VESTAL    Culture STAPHYLOCOCCUS AUREUS  Final   Report Status 11/19/2014 FINAL  Final   Organism ID, Bacteria STAPHYLOCOCCUS AUREUS  Final      Susceptibility   Staphylococcus aureus - MIC*    CIPROFLOXACIN <=0.5 SENSITIVE Sensitive     ERYTHROMYCIN >=8 RESISTANT Resistant     GENTAMICIN <=0.5 SENSITIVE Sensitive     OXACILLIN <=0.25 SENSITIVE Sensitive     TETRACYCLINE <=1 SENSITIVE Sensitive     VANCOMYCIN <=0.5 SENSITIVE Sensitive     TRIMETH/SULFA <=10 SENSITIVE Sensitive     CLINDAMYCIN <=0.25 SENSITIVE Sensitive     RIFAMPIN <=0.5 SENSITIVE Sensitive     Inducible Clindamycin NEGATIVE  Sensitive     * STAPHYLOCOCCUS AUREUS  Culture, blood (routine x 2)     Status: None (Preliminary result)   Collection Time: 11/16/14 11:34 PM  Result Value Ref Range Status   Specimen Description BLOOD LEFT HAND  Final   Special Requests BOTTLES DRAWN AEROBIC ONLY 5CC  Final   Culture NO GROWTH 2 DAYS  Final   Report Status PENDING  Incomplete  Clostridium Difficile by PCR (not at [facility])     Status: None   Collection Time: 11/17/14  5:55 PM  Result Value Ref Range Status   C difficile by pcr NEGATIVE NEGATIVE Final  Culture, blood (routine x 2)     Status: None (Preliminary result)   Collection Time: 11/18/14 10:45 AM  Result Value Ref Range Status   Specimen Description BLOOD BLOOD LEFT HAND  Final   Special Requests BOTTLES DRAWN AEROBIC ONLY 4CC  Final   Culture NO GROWTH 1 DAY  Final   Report Status PENDING  Incomplete  Culture, blood (routine x 2)     Status: None (Preliminary result)   Collection Time: 11/18/14 10:55 AM  Result Value Ref Range Status   Specimen Description BLOOD BLOOD LEFT HAND  Final   Special Requests BOTTLES DRAWN AEROBIC ONLY 4CC  Final   Culture NO GROWTH 1 DAY  Final   Report Status PENDING  Incomplete  Cath Tip Culture     Status: None (Preliminary result)   Collection Time: 11/18/14  2:44 PM  Result Value Ref Range Status   Specimen Description CATH TIP  Final   Special Requests NONE  Final   Culture   Final    >100 COLONIES STAPHYLOCOCCUS AUREUS Note: RIFAMPIN AND GENTAMICIN SHOULD NOT BE USED AS SINGLE DRUGS FOR TREATMENT OF STAPH INFECTIONS. Performed at [facility]    Report Status PENDING  Incomplete    Assessment: CC: [name] is an 81 y.o. female admitted on 11/16/2014 presenting with confusion, lethargy, and vomiting.  Patient w/ PMH of ESRD - HD TTS.   HD catheter removed Cultures with MSSA bacteremia Vancomycin random level this AM = 17  6/18 Vanc >>  Goal of Therapy:  Vancomycin trough level 15-20 mcg/ml  Plan:   Small dose of Vancomycin today --> 750 mg iv x 1 Switch to Ancef?  Thank you. Okey Regal, PharmD [phone] 11/20/2014 8:40 AM   10: 30 AM --> Changed to Ancef with HD --2 grams iv TTS  Thank you. Okey Regal, PharmD

## 2014-11-20 NOTE — Discharge Summary (Addendum)
Physician Discharge Summary  [name]:[id number] DOB: 06-26-1928 DOA: 11/16/2014  PCP: Carrie Mew, MD  Admit date: 11/16/2014 Discharge date: 11/20/2014  Time spent: 50 minutes  Recommendations for Outpatient Follow-up:  1. Ancef with dialysis for 6 wks - stop date 7/30 2. Monitor for recurrence of diarrhea- check c diff if recurs 3. Replaced PPI with Pepcid to prevent C diff  4. F/u on HIT panel- follow for improvement in platelets- avoid Heparin until HIT back and negative   Discharge Condition: stable Diet recommendation: renal, heart healthy low sodium  Discharge Diagnoses:  Principal Problem:   Staphylococcus aureus bacteremia with sepsis Active Problems:   UTI (lower urinary tract infection)   Hemodialysis catheter infection   Type 2 diabetes with nephropathy   HLD (hyperlipidemia)   GERD   End stage renal disease   Acute encephalopathy   N&V (nausea and vomiting)   Thrombocytopenia   Pressure ulcer   History of present illness:  [name] is a 81 y.o. female end-stage renal disease on hemodialysis, diabetes mellitus type 2, hypertension who was brought in by family for confusion. She was found to have a positive UA. The family stated that she gets confused with urinary tract infections. She was started on Rocephin for UTI. She continued to have fevers overnight in the hospital and was transitioned to Zosyn and Vancomycin- due to a dropping Platelet count and a culture growing gr + cocci, Zosyn was stopped.   Hospital Course:  Principal Problem:  Sepsis -With fever as high as 102 and confusion which has now improved  (A) UTI-started on ceftriaxone in ER-switched to vancomycin and Zosyn as fevers had not resolved- culture not sent prior to antibiotics in ER- I requested a culture after antibiotics started but this was not sent either- given fosfomycin on 6/19 per ID recommendations - d/c's Zosyn due to dropping platelets-  (B) MSSA in blood  cultures in one set of cultures/ infected dialysis cath -vancomycin started- obtained 2 D ECHO which is neg for endocarditis- asked for ID consult- dialysis cath removed on 6/19 (tip positive for staph as well) - fever curve improved-- ultrasound of right arm graft negative for abscess- Staph is sensitive to Ancef- will stop Vanc today- per ID, she will need 6 wks of Ancef- repeat cultures are negative  Necrotic skin on left foot medial aspect of heel -  xray of foot obtained- severe osteopenia- otherwise unrevealing    Acute encephalopathy -Due to above and now resolved  N&V (nausea and vomiting) -Resolved-patient tolerating diet  HTN - cont Norvasc   Thrombocytopenia - secondary to sepsis vs HIT vs cephalosporins given initially in the admission - avoid heparin- obtain HIT panel - platelets dropped to a low of 45 and then improved to 60- count pending today  Diet controlled DM  Procedures:  dialysis cath removal   2D ECHO  Ultrasound of graft  ABIs - right 0.90- left 0.98  Consultations:  Nephrology, ID  Discharge Exam: Filed Weights   11/17/14 2125 11/18/14 0459 11/20/14 0759  Weight: 63.2 kg (139 lb 5.3 oz) 61.8 kg (136 lb 3.9 oz) 60.9 kg (134 lb 4.2 oz)   Filed Vitals:   11/20/14 0850  BP: 134/63  Pulse: 73  Temp:   Resp: 18    General: AAO x 3, no distress Cardiovascular: RRR, no murmurs  Respiratory: clear to auscultation bilaterally GI: soft, non-tender, non-distended, bowel sound positive  Discharge Instructions You were cared for by a hospitalist during  your hospital stay. If you have any questions about your discharge medications or the care you received while you were in the hospital after you are discharged, you can call the unit and asked to speak with the hospitalist on call if the hospitalist that took care of you is not available. Once you are discharged, your primary care physician will handle any further medical issues. Please note that  NO REFILLS for any discharge medications will be authorized once you are discharged, as it is imperative that you return to your primary care physician (or establish a relationship with a primary care physician if you do not have one) for your aftercare needs so that they can reassess your need for medications and monitor your lab values.      Discharge Instructions    Discharge instructions    Complete by:  As directed   Ancef to be given on Dialysis days for a total of 6 wks to be arranged by Nephrology. Take a heart healthy, dialysis diet     Increase activity slowly    Complete by:  As directed             Medication List    STOP taking these medications        NEXIUM 40 MG capsule  Generic drug:  esomeprazole      TAKE these medications        amLODipine 5 MG tablet  Commonly known as:  NORVASC  Take 2.5 mg by mouth daily. Take half tablet on Sundays mondays wednesdays and fridays     aspirin 81 MG tablet  Take 81 mg by mouth daily.     cinacalcet 30 MG tablet  Commonly known as:  SENSIPAR  Take 30 mg by mouth daily.     COSOPT OP  Place 1 drop into both eyes daily.     escitalopram 10 MG tablet  Commonly known as:  LEXAPRO  TAKE ONE-HALF TABLET BY MOUTH ONCE DAILY     famotidine 20 MG tablet  Commonly known as:  PEPCID  Take 1 tablet (20 mg total) by mouth 2 (two) times daily.     folic acid-vitamin b complex-vitamin c-selenium-zinc 3 MG Tabs tablet  Take 1 tablet by mouth daily.     loperamide 2 MG capsule  Commonly known as:  IMODIUM  Take 1 capsule (2 mg total) by mouth as needed for diarrhea or loose stools.     multivitamin Tabs tablet  Take 1 tablet by mouth at bedtime.     promethazine 25 MG tablet  Commonly known as:  PHENERGAN  Take 12.5 mg by mouth every 6 (six) hours as needed for nausea or vomiting.     quiNINE 324 MG capsule  Commonly known as:  QUALAQUIN  Take 648 mg by mouth 3 (three) times daily. Take on tuesdays thursdays and  saturdays     sevelamer 800 MG tablet  Commonly known as:  RENAGEL  Take 800 mg by mouth 4 (four) times daily.     simvastatin 80 MG tablet  Commonly known as:  ZOCOR  TAKE ONE TABLET BY MOUTH EVERY DAY     TYLENOL EXTRA STRENGTH 167 MG/5ML Liqd  Generic drug:  Acetaminophen  Take 5 mLs by mouth daily as needed. For pain/fever       No Known Allergies    The results of significant diagnostics from this hospitalization (including imaging, microbiology, ancillary and laboratory) are listed below for reference.    Significant Diagnostic  Studies: Dg Chest 2 View  11/16/2014   CLINICAL DATA:  Confusion, lethargy and vomiting for 1 day  EXAM: CHEST  2 VIEW  COMPARISON:  Radiograph 07/24/2013  FINDINGS: Large-bore left and right central venous lines noted. No pulmonary edema. No pleural fluid. Cardiac silhouette is normal.  IMPRESSION: No acute cardiopulmonary process.   Electronically Signed   By: Genevive Bi M.D.   On: 11/16/2014 17:57   Ct Head Wo Contrast  11/16/2014   CLINICAL DATA:  Confusion, lethargy, vomiting, diabetes mellitus, hypertension, end-stage renal disease on dialysis  EXAM: CT HEAD WITHOUT CONTRAST  TECHNIQUE: Contiguous axial images were obtained from the base of the skull through the vertex without intravenous contrast.  COMPARISON:  07/07/2010 ; correlation MR brain 07/23/2010  FINDINGS: Generalized atrophy.  Normal ventricular morphology.  No midline shift or mass effect.  Small vessel chronic ischemic changes of deep cerebral white matter.  Asymmetric positioning in gantry.  No gross intracranial hemorrhage, mass lesion, or evidence acute infarction.  Small exostosis arising from the inner table of the RIGHT temporal bone appears unchanged.  Extensive atherosclerotic calcifications at skullbase.  Calcified RIGHT intraorbital mass identified at inferior RIGHT orbit, 22 x 14 mm unchanged ; this was previously evaluated by MR.  Partial opacification of LEFT ethmoid air  cells.  No acute osseous findings.  IMPRESSION: Atrophy with small vessel chronic ischemic changes of deep cerebral white matter.  No acute intracranial abnormalities.  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   Electronically Signed   By: Ulyses Southward M.D.   On: 11/16/2014 17:29   Dg Foot Complete Left  11/18/2014   CLINICAL DATA:  Diabetic foot ulcer  EXAM: LEFT FOOT - COMPLETE 3+ VIEW  COMPARISON:  None.  FINDINGS: Bones are markedly subjectively osteopenic. Deformity of the fourth and fifth metatarsals is most compatible with remote healed fracture. No acute fracture or dislocation is evident. Possible clothing artifact or radiopaque foreign body noted over the lower leg, correlate clinically.  IMPRESSION: Allowing for marked osteopenia, which could mask a subtle fracture, no fracture or dislocation is identified. If symptoms continue, consider repeat imaging in 7-10 days or MRI with contrast if osteomyelitis is in question.   Electronically Signed   By: Christiana Pellant M.D.   On: 11/18/2014 17:44    Microbiology: Recent Results (from the past 240 hour(s))  Culture, blood (routine x 2)     Status: None   Collection Time: 11/16/14 11:24 PM  Result Value Ref Range Status   Specimen Description BLOOD LEFT HAND  Final   Special Requests BOTTLES DRAWN AEROBIC ONLY 3CC  Final   Culture  Setup Time   Final    GRAM POSITIVE COCCI IN CLUSTERS AEROBIC BOTTLE ONLY CRITICAL RESULT CALLED TO, READ BACK BY AND VERIFIED WITH: S JETER 11/17/14 @ 1236 M VESTAL    Culture STAPHYLOCOCCUS AUREUS  Final   Report Status 11/19/2014 FINAL  Final   Organism ID, Bacteria STAPHYLOCOCCUS AUREUS  Final      Susceptibility   Staphylococcus aureus - MIC*    CIPROFLOXACIN <=0.5 SENSITIVE Sensitive     ERYTHROMYCIN >=8 RESISTANT Resistant     GENTAMICIN <=0.5 SENSITIVE Sensitive     OXACILLIN <=0.25 SENSITIVE Sensitive     TETRACYCLINE  <=1 SENSITIVE Sensitive     VANCOMYCIN <=0.5 SENSITIVE Sensitive     TRIMETH/SULFA <=10 SENSITIVE Sensitive     CLINDAMYCIN <=0.25 SENSITIVE Sensitive  RIFAMPIN <=0.5 SENSITIVE Sensitive     Inducible Clindamycin NEGATIVE Sensitive     * STAPHYLOCOCCUS AUREUS  Culture, blood (routine x 2)     Status: None (Preliminary result)   Collection Time: 11/16/14 11:34 PM  Result Value Ref Range Status   Specimen Description BLOOD LEFT HAND  Final   Special Requests BOTTLES DRAWN AEROBIC ONLY 5CC  Final   Culture NO GROWTH 2 DAYS  Final   Report Status PENDING  Incomplete  Clostridium Difficile by PCR (not at [facility])     Status: None   Collection Time: 11/17/14  5:55 PM  Result Value Ref Range Status   C difficile by pcr NEGATIVE NEGATIVE Final  Culture, blood (routine x 2)     Status: None (Preliminary result)   Collection Time: 11/18/14 10:45 AM  Result Value Ref Range Status   Specimen Description BLOOD BLOOD LEFT HAND  Final   Special Requests BOTTLES DRAWN AEROBIC ONLY 4CC  Final   Culture NO GROWTH 1 DAY  Final   Report Status PENDING  Incomplete  Culture, blood (routine x 2)     Status: None (Preliminary result)   Collection Time: 11/18/14 10:55 AM  Result Value Ref Range Status   Specimen Description BLOOD BLOOD LEFT HAND  Final   Special Requests BOTTLES DRAWN AEROBIC ONLY 4CC  Final   Culture NO GROWTH 1 DAY  Final   Report Status PENDING  Incomplete  Cath Tip Culture     Status: None (Preliminary result)   Collection Time: 11/18/14  2:44 PM  Result Value Ref Range Status   Specimen Description CATH TIP  Final   Special Requests NONE  Final   Culture   Final    >100 COLONIES STAPHYLOCOCCUS AUREUS Note: RIFAMPIN AND GENTAMICIN SHOULD NOT BE USED AS SINGLE DRUGS FOR TREATMENT OF STAPH INFECTIONS. Performed at [facility]    Report Status PENDING  Incomplete     Labs: Basic Metabolic Panel:  Recent Labs Lab 11/16/14 1358 11/16/14 2125 11/17/14 0453  11/17/14 2000 11/19/14 0625 11/20/14 0839  NA 139  --  140 133* 134* 134*  K 3.5  --  3.8 3.9 3.5 3.4*  CL 97*  --  99* 96* 97* 100*  CO2 29  --  31 26 27 25   GLUCOSE 115*  --  80 76 138* 137*  BUN 22*  --  32* 42* 28* 33*  CREATININE 5.96*  --  7.50* 8.42* 6.72* 8.36*  CALCIUM 9.3  --  8.4* 8.1* 8.6* 9.0  MG  --  2.0  --   --   --   --   PHOS  --  3.3  --  3.6  --  1.3*   Liver Function Tests:  Recent Labs Lab 11/16/14 1358 11/17/14 2000 11/20/14 0839  AST 26  --   --   ALT 13*  --   --   ALKPHOS 72  --   --   BILITOT 0.9  --   --   PROT 7.6  --   --   ALBUMIN 3.5 2.9* 2.6*    Recent Labs Lab 11/16/14 1748  LIPASE 35   No results for input(s): AMMONIA in the last 168 hours. CBC:  Recent Labs Lab 11/17/14 0453 11/17/14 2000 11/18/14 0308 11/19/14 0625 11/20/14 0620  WBC 7.6 7.5 6.6 6.2 6.4  HGB 9.4* 9.0* 9.2* 8.6* 9.0*  HCT 29.3* 27.8* 28.7* 26.7* 28.9*  MCV 92.4 90.0 91.1 90.2 91.7  PLT  61* 54* 45* 60* PLATELET CLUMPS NOTED ON SMEAR, COUNT APPEARS DECREASED   Cardiac Enzymes: No results for input(s): CKTOTAL, CKMB, CKMBINDEX, TROPONINI in the last 168 hours. BNP: BNP (last 3 results) No results for input(s): BNP in the last 8760 hours.  ProBNP (last 3 results) No results for input(s): PROBNP in the last 8760 hours.  CBG:  Recent Labs Lab 11/19/14 0809 11/19/14 1210 11/19/14 1725 11/19/14 2013 11/19/14 2238  GLUCAP 133* 195* 162* 179* 189*       SignedCalvert Cantor, MD [facility] 11/20/2014, 10:32 AM

## 2014-11-20 NOTE — Progress Notes (Signed)
[facility]  Date of Admission:  11/16/2014  Antibiotics: Vancomycin after dialysis  Subjective: No complaints, feels she is better  Objective: Temp:  [98.5 F (36.9 C)-99.3 F (37.4 C)] 98.5 F (36.9 C) (06/21 0759) Pulse Rate:  [69-73] 73 (06/21 0850) Resp:  [13-18] 18 (06/21 0850) BP: (108-138)/(40-68) 134/63 mmHg (06/21 0850) SpO2:  [97 %-100 %] 97 % (06/21 0645) Weight:  [134 lb 4.2 oz (60.9 kg)] 134 lb 4.2 oz (60.9 kg) (06/21 0759)  General: awake, alert, nad Skin: no rashes Lungs: CTA B Cor: RRR Chest: left  Catheter removed, non tender  Lab Results Lab Results  Component Value Date   WBC 6.4 11/20/2014   HGB 9.0* 11/20/2014   HCT 28.9* 11/20/2014   MCV 91.7 11/20/2014   PLT  11/20/2014    PLATELET CLUMPS NOTED ON SMEAR, COUNT APPEARS DECREASED    Lab Results  Component Value Date   CREATININE 8.36* 11/20/2014   BUN 33* 11/20/2014   NA 134* 11/20/2014   K 3.4* 11/20/2014   CL 100* 11/20/2014   CO2 25 11/20/2014    Lab Results  Component Value Date   ALT 13* 11/16/2014   AST 26 11/16/2014   ALKPHOS 72 11/16/2014   BILITOT 0.9 11/16/2014      Microbiology: Recent Results (from the past 240 hour(s))  Culture, blood (routine x 2)     Status: None   Collection Time: 11/16/14 11:24 PM  Result Value Ref Range Status   Specimen Description BLOOD LEFT HAND  Final   Special Requests BOTTLES DRAWN AEROBIC ONLY 3CC  Final   Culture  Setup Time   Final    GRAM POSITIVE COCCI IN CLUSTERS AEROBIC BOTTLE ONLY CRITICAL RESULT CALLED TO, READ BACK BY AND VERIFIED WITH: S JETER 11/17/14 @ 1236 M VESTAL    Culture STAPHYLOCOCCUS AUREUS  Final   Report Status 11/19/2014 FINAL  Final   Organism ID, Bacteria STAPHYLOCOCCUS AUREUS  Final      Susceptibility   Staphylococcus aureus - MIC*    CIPROFLOXACIN <=0.5 SENSITIVE Sensitive     ERYTHROMYCIN >=8 RESISTANT Resistant     GENTAMICIN <=0.5 SENSITIVE Sensitive     OXACILLIN <=0.25  SENSITIVE Sensitive     TETRACYCLINE <=1 SENSITIVE Sensitive     VANCOMYCIN <=0.5 SENSITIVE Sensitive     TRIMETH/SULFA <=10 SENSITIVE Sensitive     CLINDAMYCIN <=0.25 SENSITIVE Sensitive     RIFAMPIN <=0.5 SENSITIVE Sensitive     Inducible Clindamycin NEGATIVE Sensitive     * STAPHYLOCOCCUS AUREUS  Culture, blood (routine x 2)     Status: None (Preliminary result)   Collection Time: 11/16/14 11:34 PM  Result Value Ref Range Status   Specimen Description BLOOD LEFT HAND  Final   Special Requests BOTTLES DRAWN AEROBIC ONLY 5CC  Final   Culture NO GROWTH 2 DAYS  Final   Report Status PENDING  Incomplete  Clostridium Difficile by PCR (not at [facility])     Status: None   Collection Time: 11/17/14  5:55 PM  Result Value Ref Range Status   C difficile by pcr NEGATIVE NEGATIVE Final  Culture, blood (routine x 2)     Status: None (Preliminary result)   Collection Time: 11/18/14 10:45 AM  Result Value Ref Range Status   Specimen Description BLOOD BLOOD LEFT HAND  Final   Special Requests BOTTLES DRAWN AEROBIC ONLY 4CC  Final   Culture NO GROWTH 1 DAY  Final   Report Status  PENDING  Incomplete  Culture, blood (routine x 2)     Status: None (Preliminary result)   Collection Time: 11/18/14 10:55 AM  Result Value Ref Range Status   Specimen Description BLOOD BLOOD LEFT HAND  Final   Special Requests BOTTLES DRAWN AEROBIC ONLY 4CC  Final   Culture NO GROWTH 1 DAY  Final   Report Status PENDING  Incomplete  Cath Tip Culture     Status: None (Preliminary result)   Collection Time: 11/18/14  2:44 PM  Result Value Ref Range Status   Specimen Description CATH TIP  Final   Special Requests NONE  Final   Culture   Final    >100 COLONIES STAPHYLOCOCCUS AUREUS Note: RIFAMPIN AND GENTAMICIN SHOULD NOT BE USED AS SINGLE DRUGS FOR TREATMENT OF STAPH INFECTIONS. Performed at [facility]    Report Status PENDING  Incomplete    Studies/Results: Dg Foot Complete Left  11/18/2014   CLINICAL  DATA:  Diabetic foot ulcer  EXAM: LEFT FOOT - COMPLETE 3+ VIEW  COMPARISON:  None.  FINDINGS: Bones are markedly subjectively osteopenic. Deformity of the fourth and fifth metatarsals is most compatible with remote healed fracture. No acute fracture or dislocation is evident. Possible clothing artifact or radiopaque foreign body noted over the lower leg, correlate clinically.  IMPRESSION: Allowing for marked osteopenia, which could mask a subtle fracture, no fracture or dislocation is identified. If symptoms continue, consider repeat imaging in 7-10 days or MRI with contrast if osteomyelitis is in question.   Electronically Signed   By: Christiana Pellant M.D.   On: 11/18/2014 17:44    Assessment/Plan:  1) Staph aureus bacteremia - 1/2.  Sensitivities pending.  TTE pending.   Will need 6 weeks of either cefazolin through July 30th -TEE not indicated unless concerns on TTE.  Will get prolonged course regardless.     Staci Righter, MD [facility] [email or web address] [phone] pager   [phone] cell 11/20/2014, 10:00 AM

## 2014-11-20 NOTE — Progress Notes (Signed)
Subjective:  Seen on HD- culture is back- is MSSA- ultrasound of AVG is OK - is happy that she gets to go home  Objective Vital signs in last 24 hours: Filed Vitals:   11/20/14 0759 11/20/14 0818 11/20/14 0835 11/20/14 0850  BP: 138/60 131/60 128/68 134/63  Pulse: 70 69 72 73  Temp: 98.5 F (36.9 C)     TempSrc: Oral     Resp: Height:      Weight: 60.9 kg (134 lb 4.2 oz)     SpO2:       Weight change:   Physical Exam: General: Alert , NAD , Appropriate , nl MS Heart: RR no MRG Lungs: CTA Abdomen: soft, NT, ND  Extremities: no pedal edema  Dialysis Access: pos bruit R U [name] Hero With bandage not removed for the most part HERO looks good- 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 Staph aureus bacteremia -is MSSA- HD cath +/- AVG infection.  ID seeing TTE negative   Noted 6 weeks ancef needed  Through July 30th/ perm cath out yesterday/using the graft  In hosp and op center.  Will arrange the ancef at the OP center 2. ESRD HD TTS via HERO on schedule  3.5 k HD today- next due Thursday at OP center 3. Vol/ HTN= bp 128/48  On Norvasc/ at dry wt 4. MBD =cont meds/ ca phos stable- on renvela and hectorol and sensipar- phos low today but suspect it will increase when she is on [name] more regular diet for her 5. Anemia = hgb = 9.0 continue ESA- is dropping with hosp 6. DM per primary Dispo- discharge today- 6 weeks of ancef at OP unit.     [name]   11/20/2014     Labs: Basic Metabolic Panel:  Recent Labs Lab 11/16/14 2125  11/17/14 2000 11/19/14 0625 11/20/14 0839  NA  --   < > 133* 134* 134*  K  --   < > 3.9 3.5 3.4*  CL  --   < > 96* 97* 100*  CO2  --   < > GLUCOSE  --   < > 76 138* 137*  BUN  --   < > 42* 28* 33*  CREATININE  --   < > 8.42* 6.72* 8.36*  CALCIUM  --   < > 8.1* 8.6* 9.0  PHOS 3.3   --  3.6  --  1.3*  < > = values in this interval not displayed. Liver Function Tests:  Recent Labs Lab 11/16/14 1358 11/17/14 2000 11/20/14 0839  AST 26  --   --   ALT 13*  --   --   ALKPHOS 72  --   --   BILITOT 0.9  --   --   PROT 7.6  --   --   ALBUMIN 3.5 2.9* 2.6*    Recent Labs Lab 11/16/14 1748  LIPASE 35   No results for input(s): AMMONIA in the last 168 hours. CBC:  Recent Labs Lab 11/17/14 0453 11/17/14 2000 11/18/14 0308 11/19/14 0625 11/20/14 0620  WBC 7.6 7.5 6.6 6.2 6.4  HGB 9.4* 9.0* 9.2* 8.6* 9.0*  HCT 29.3* 27.8* 28.7* 26.7* 28.9*  MCV 92.4 90.0 91.1 90.2 91.7  PLT 61* 54* 45* 60* PLATELET CLUMPS NOTED  ON SMEAR, COUNT APPEARS DECREASED   Cardiac Enzymes: No results for input(s): CKTOTAL, CKMB, CKMBINDEX, TROPONINI in the last 168 hours. CBG:  Recent Labs Lab 11/19/14 0809 11/19/14 1210 11/19/14 1725 11/19/14 2013 11/19/14 2238  GLUCAP 133* 195* 162* 179* 189*    Studies/Results: Dg Foot Complete Left  11/18/2014   CLINICAL DATA:  Diabetic foot ulcer  EXAM: LEFT FOOT - COMPLETE 3+ VIEW  COMPARISON:  None.  FINDINGS: Bones are markedly subjectively osteopenic. Deformity of the fourth and fifth metatarsals is most compatible with remote healed fracture. No acute fracture or dislocation is evident. Possible clothing artifact or radiopaque foreign body noted over the lower leg, correlate clinically.  IMPRESSION: Allowing for marked osteopenia, which could mask [name] subtle fracture, no fracture or dislocation is identified. If symptoms continue, consider repeat imaging in 7-10 days or MRI with contrast if osteomyelitis is in question.   Electronically Signed   By: Christiana Pellant M.D.   On: 11/18/2014 17:44   Medications:   . amLODipine  5 mg Oral Q M,W,F,Su-1800  . aspirin EC  81 mg Oral Daily  . atorvastatin  40 mg Oral q1800  . cinacalcet  30 mg Oral Q breakfast  . darbepoetin (ARANESP) injection - DIALYSIS  100 mcg Intravenous Q Sat-HD  .  dorzolamide-timolol  1 drop Both Eyes Daily  . doxercalciferol  2 mcg Intravenous Q T,Th,Sa-HD  . escitalopram  5 mg Oral Daily  . feeding supplement (NEPRO CARB STEADY)  237 mL Oral Q24H  . lidocaine-EPINEPHrine  10 mL Intradermal Once  . multivitamin  1 tablet Oral QHS  . pantoprazole  80 mg Oral Q1200  . sevelamer carbonate  800 mg Oral TID WC  . vancomycin  750 mg Intravenous Once

## 2014-11-20 NOTE — Procedures (Signed)
Patient was seen on dialysis and the procedure was supervised.  BFR 400  Via HERO BP is  134/63.   Patient appears to be tolerating treatment well  [name] A 11/20/2014

## 2014-11-21 LAB — CATH TIP CULTURE: Culture: >100

## 2014-11-22 DIAGNOSIS — A4101 Sepsis due to Methicillin susceptible Staphylococcus aureus: Secondary | ICD-10-CM | POA: Diagnosis not present

## 2014-11-22 DIAGNOSIS — N2581 Secondary hyperparathyroidism of renal origin: Secondary | ICD-10-CM | POA: Diagnosis not present

## 2014-11-22 DIAGNOSIS — D631 Anemia in chronic kidney disease: Secondary | ICD-10-CM | POA: Diagnosis not present

## 2014-11-22 DIAGNOSIS — N186 End stage renal disease: Secondary | ICD-10-CM | POA: Diagnosis not present

## 2014-11-22 LAB — CULTURE, BLOOD (ROUTINE X 2): CULTURE: NO GROWTH

## 2014-11-23 LAB — CULTURE, BLOOD (ROUTINE X 2)
Culture: NO GROWTH
Culture: NO GROWTH

## 2014-11-24 DIAGNOSIS — D631 Anemia in chronic kidney disease: Secondary | ICD-10-CM | POA: Diagnosis not present

## 2014-11-24 DIAGNOSIS — N2581 Secondary hyperparathyroidism of renal origin: Secondary | ICD-10-CM | POA: Diagnosis not present

## 2014-11-24 DIAGNOSIS — N186 End stage renal disease: Secondary | ICD-10-CM | POA: Diagnosis not present

## 2014-11-24 DIAGNOSIS — A4101 Sepsis due to Methicillin susceptible Staphylococcus aureus: Secondary | ICD-10-CM | POA: Diagnosis not present

## 2014-11-26 ENCOUNTER — Ambulatory Visit: Admit: 2014-11-26 | Payer: Self-pay | Admitting: Vascular Surgery

## 2014-11-26 SURGERY — DIALYSIS/PERMA CATHETER REMOVAL
Anesthesia: Moderate Sedation

## 2014-11-27 DIAGNOSIS — N186 End stage renal disease: Secondary | ICD-10-CM | POA: Diagnosis not present

## 2014-11-27 DIAGNOSIS — A4101 Sepsis due to Methicillin susceptible Staphylococcus aureus: Secondary | ICD-10-CM | POA: Diagnosis not present

## 2014-11-27 DIAGNOSIS — D631 Anemia in chronic kidney disease: Secondary | ICD-10-CM | POA: Diagnosis not present

## 2014-11-27 DIAGNOSIS — N2581 Secondary hyperparathyroidism of renal origin: Secondary | ICD-10-CM | POA: Diagnosis not present

## 2014-11-29 DIAGNOSIS — E1129 Type 2 diabetes mellitus with other diabetic kidney complication: Secondary | ICD-10-CM | POA: Diagnosis not present

## 2014-11-29 DIAGNOSIS — N2581 Secondary hyperparathyroidism of renal origin: Secondary | ICD-10-CM | POA: Diagnosis not present

## 2014-11-29 DIAGNOSIS — A4101 Sepsis due to Methicillin susceptible Staphylococcus aureus: Secondary | ICD-10-CM | POA: Diagnosis not present

## 2014-11-29 DIAGNOSIS — Z992 Dependence on renal dialysis: Secondary | ICD-10-CM | POA: Diagnosis not present

## 2014-11-29 DIAGNOSIS — N186 End stage renal disease: Secondary | ICD-10-CM | POA: Diagnosis not present

## 2014-11-29 DIAGNOSIS — D631 Anemia in chronic kidney disease: Secondary | ICD-10-CM | POA: Diagnosis not present

## 2014-12-01 DIAGNOSIS — D631 Anemia in chronic kidney disease: Secondary | ICD-10-CM | POA: Diagnosis not present

## 2014-12-01 DIAGNOSIS — N186 End stage renal disease: Secondary | ICD-10-CM | POA: Diagnosis not present

## 2014-12-01 DIAGNOSIS — A4101 Sepsis due to Methicillin susceptible Staphylococcus aureus: Secondary | ICD-10-CM | POA: Diagnosis not present

## 2014-12-01 DIAGNOSIS — N2581 Secondary hyperparathyroidism of renal origin: Secondary | ICD-10-CM | POA: Diagnosis not present

## 2014-12-04 DIAGNOSIS — A4101 Sepsis due to Methicillin susceptible Staphylococcus aureus: Secondary | ICD-10-CM | POA: Diagnosis not present

## 2014-12-04 DIAGNOSIS — N2581 Secondary hyperparathyroidism of renal origin: Secondary | ICD-10-CM | POA: Diagnosis not present

## 2014-12-04 DIAGNOSIS — N186 End stage renal disease: Secondary | ICD-10-CM | POA: Diagnosis not present

## 2014-12-04 DIAGNOSIS — D631 Anemia in chronic kidney disease: Secondary | ICD-10-CM | POA: Diagnosis not present

## 2014-12-06 DIAGNOSIS — N186 End stage renal disease: Secondary | ICD-10-CM | POA: Diagnosis not present

## 2014-12-06 DIAGNOSIS — A4101 Sepsis due to Methicillin susceptible Staphylococcus aureus: Secondary | ICD-10-CM | POA: Diagnosis not present

## 2014-12-06 DIAGNOSIS — N2581 Secondary hyperparathyroidism of renal origin: Secondary | ICD-10-CM | POA: Diagnosis not present

## 2014-12-06 DIAGNOSIS — D631 Anemia in chronic kidney disease: Secondary | ICD-10-CM | POA: Diagnosis not present

## 2014-12-08 DIAGNOSIS — N186 End stage renal disease: Secondary | ICD-10-CM | POA: Diagnosis not present

## 2014-12-08 DIAGNOSIS — D631 Anemia in chronic kidney disease: Secondary | ICD-10-CM | POA: Diagnosis not present

## 2014-12-08 DIAGNOSIS — A4101 Sepsis due to Methicillin susceptible Staphylococcus aureus: Secondary | ICD-10-CM | POA: Diagnosis not present

## 2014-12-08 DIAGNOSIS — N2581 Secondary hyperparathyroidism of renal origin: Secondary | ICD-10-CM | POA: Diagnosis not present

## 2014-12-11 DIAGNOSIS — N2581 Secondary hyperparathyroidism of renal origin: Secondary | ICD-10-CM | POA: Diagnosis not present

## 2014-12-11 DIAGNOSIS — N186 End stage renal disease: Secondary | ICD-10-CM | POA: Diagnosis not present

## 2014-12-11 DIAGNOSIS — D631 Anemia in chronic kidney disease: Secondary | ICD-10-CM | POA: Diagnosis not present

## 2014-12-11 DIAGNOSIS — A4101 Sepsis due to Methicillin susceptible Staphylococcus aureus: Secondary | ICD-10-CM | POA: Diagnosis not present

## 2014-12-13 DIAGNOSIS — A4101 Sepsis due to Methicillin susceptible Staphylococcus aureus: Secondary | ICD-10-CM | POA: Diagnosis not present

## 2014-12-13 DIAGNOSIS — N2581 Secondary hyperparathyroidism of renal origin: Secondary | ICD-10-CM | POA: Diagnosis not present

## 2014-12-13 DIAGNOSIS — N186 End stage renal disease: Secondary | ICD-10-CM | POA: Diagnosis not present

## 2014-12-13 DIAGNOSIS — D631 Anemia in chronic kidney disease: Secondary | ICD-10-CM | POA: Diagnosis not present

## 2014-12-15 DIAGNOSIS — A4101 Sepsis due to Methicillin susceptible Staphylococcus aureus: Secondary | ICD-10-CM | POA: Diagnosis not present

## 2014-12-15 DIAGNOSIS — N186 End stage renal disease: Secondary | ICD-10-CM | POA: Diagnosis not present

## 2014-12-15 DIAGNOSIS — D631 Anemia in chronic kidney disease: Secondary | ICD-10-CM | POA: Diagnosis not present

## 2014-12-15 DIAGNOSIS — N2581 Secondary hyperparathyroidism of renal origin: Secondary | ICD-10-CM | POA: Diagnosis not present

## 2014-12-18 ENCOUNTER — Other Ambulatory Visit: Payer: Self-pay | Admitting: Internal Medicine

## 2014-12-18 DIAGNOSIS — N186 End stage renal disease: Secondary | ICD-10-CM | POA: Diagnosis not present

## 2014-12-18 DIAGNOSIS — A4101 Sepsis due to Methicillin susceptible Staphylococcus aureus: Secondary | ICD-10-CM | POA: Diagnosis not present

## 2014-12-18 DIAGNOSIS — N2581 Secondary hyperparathyroidism of renal origin: Secondary | ICD-10-CM | POA: Diagnosis not present

## 2014-12-18 DIAGNOSIS — D631 Anemia in chronic kidney disease: Secondary | ICD-10-CM | POA: Diagnosis not present

## 2014-12-20 DIAGNOSIS — N186 End stage renal disease: Secondary | ICD-10-CM | POA: Diagnosis not present

## 2014-12-20 DIAGNOSIS — N2581 Secondary hyperparathyroidism of renal origin: Secondary | ICD-10-CM | POA: Diagnosis not present

## 2014-12-20 DIAGNOSIS — A4101 Sepsis due to Methicillin susceptible Staphylococcus aureus: Secondary | ICD-10-CM | POA: Diagnosis not present

## 2014-12-20 DIAGNOSIS — D631 Anemia in chronic kidney disease: Secondary | ICD-10-CM | POA: Diagnosis not present

## 2014-12-22 DIAGNOSIS — A4101 Sepsis due to Methicillin susceptible Staphylococcus aureus: Secondary | ICD-10-CM | POA: Diagnosis not present

## 2014-12-22 DIAGNOSIS — N2581 Secondary hyperparathyroidism of renal origin: Secondary | ICD-10-CM | POA: Diagnosis not present

## 2014-12-22 DIAGNOSIS — N186 End stage renal disease: Secondary | ICD-10-CM | POA: Diagnosis not present

## 2014-12-22 DIAGNOSIS — D631 Anemia in chronic kidney disease: Secondary | ICD-10-CM | POA: Diagnosis not present

## 2014-12-24 ENCOUNTER — Other Ambulatory Visit: Payer: Self-pay | Admitting: Internal Medicine

## 2014-12-25 DIAGNOSIS — N2581 Secondary hyperparathyroidism of renal origin: Secondary | ICD-10-CM | POA: Diagnosis not present

## 2014-12-25 DIAGNOSIS — D631 Anemia in chronic kidney disease: Secondary | ICD-10-CM | POA: Diagnosis not present

## 2014-12-25 DIAGNOSIS — A4101 Sepsis due to Methicillin susceptible Staphylococcus aureus: Secondary | ICD-10-CM | POA: Diagnosis not present

## 2014-12-25 DIAGNOSIS — N186 End stage renal disease: Secondary | ICD-10-CM | POA: Diagnosis not present

## 2014-12-27 DIAGNOSIS — N2581 Secondary hyperparathyroidism of renal origin: Secondary | ICD-10-CM | POA: Diagnosis not present

## 2014-12-27 DIAGNOSIS — D631 Anemia in chronic kidney disease: Secondary | ICD-10-CM | POA: Diagnosis not present

## 2014-12-27 DIAGNOSIS — A4101 Sepsis due to Methicillin susceptible Staphylococcus aureus: Secondary | ICD-10-CM | POA: Diagnosis not present

## 2014-12-27 DIAGNOSIS — N186 End stage renal disease: Secondary | ICD-10-CM | POA: Diagnosis not present

## 2014-12-29 DIAGNOSIS — D631 Anemia in chronic kidney disease: Secondary | ICD-10-CM | POA: Diagnosis not present

## 2014-12-29 DIAGNOSIS — A4101 Sepsis due to Methicillin susceptible Staphylococcus aureus: Secondary | ICD-10-CM | POA: Diagnosis not present

## 2014-12-29 DIAGNOSIS — N186 End stage renal disease: Secondary | ICD-10-CM | POA: Diagnosis not present

## 2014-12-29 DIAGNOSIS — N2581 Secondary hyperparathyroidism of renal origin: Secondary | ICD-10-CM | POA: Diagnosis not present

## 2014-12-30 DIAGNOSIS — Z992 Dependence on renal dialysis: Secondary | ICD-10-CM | POA: Diagnosis not present

## 2014-12-30 DIAGNOSIS — N186 End stage renal disease: Secondary | ICD-10-CM | POA: Diagnosis not present

## 2014-12-30 DIAGNOSIS — E1129 Type 2 diabetes mellitus with other diabetic kidney complication: Secondary | ICD-10-CM | POA: Diagnosis not present

## 2015-01-01 DIAGNOSIS — N2581 Secondary hyperparathyroidism of renal origin: Secondary | ICD-10-CM | POA: Diagnosis not present

## 2015-01-01 DIAGNOSIS — D509 Iron deficiency anemia, unspecified: Secondary | ICD-10-CM | POA: Diagnosis not present

## 2015-01-01 DIAGNOSIS — A4101 Sepsis due to Methicillin susceptible Staphylococcus aureus: Secondary | ICD-10-CM | POA: Diagnosis not present

## 2015-01-01 DIAGNOSIS — N186 End stage renal disease: Secondary | ICD-10-CM | POA: Diagnosis not present

## 2015-01-01 DIAGNOSIS — D631 Anemia in chronic kidney disease: Secondary | ICD-10-CM | POA: Diagnosis not present

## 2015-01-03 DIAGNOSIS — D509 Iron deficiency anemia, unspecified: Secondary | ICD-10-CM | POA: Diagnosis not present

## 2015-01-03 DIAGNOSIS — A4101 Sepsis due to Methicillin susceptible Staphylococcus aureus: Secondary | ICD-10-CM | POA: Diagnosis not present

## 2015-01-03 DIAGNOSIS — D631 Anemia in chronic kidney disease: Secondary | ICD-10-CM | POA: Diagnosis not present

## 2015-01-03 DIAGNOSIS — N186 End stage renal disease: Secondary | ICD-10-CM | POA: Diagnosis not present

## 2015-01-03 DIAGNOSIS — N2581 Secondary hyperparathyroidism of renal origin: Secondary | ICD-10-CM | POA: Diagnosis not present

## 2015-01-05 DIAGNOSIS — N186 End stage renal disease: Secondary | ICD-10-CM | POA: Diagnosis not present

## 2015-01-05 DIAGNOSIS — N2581 Secondary hyperparathyroidism of renal origin: Secondary | ICD-10-CM | POA: Diagnosis not present

## 2015-01-05 DIAGNOSIS — D631 Anemia in chronic kidney disease: Secondary | ICD-10-CM | POA: Diagnosis not present

## 2015-01-05 DIAGNOSIS — A4101 Sepsis due to Methicillin susceptible Staphylococcus aureus: Secondary | ICD-10-CM | POA: Diagnosis not present

## 2015-01-05 DIAGNOSIS — D509 Iron deficiency anemia, unspecified: Secondary | ICD-10-CM | POA: Diagnosis not present

## 2015-01-08 DIAGNOSIS — A4101 Sepsis due to Methicillin susceptible Staphylococcus aureus: Secondary | ICD-10-CM | POA: Diagnosis not present

## 2015-01-08 DIAGNOSIS — D631 Anemia in chronic kidney disease: Secondary | ICD-10-CM | POA: Diagnosis not present

## 2015-01-08 DIAGNOSIS — N186 End stage renal disease: Secondary | ICD-10-CM | POA: Diagnosis not present

## 2015-01-08 DIAGNOSIS — D509 Iron deficiency anemia, unspecified: Secondary | ICD-10-CM | POA: Diagnosis not present

## 2015-01-08 DIAGNOSIS — N2581 Secondary hyperparathyroidism of renal origin: Secondary | ICD-10-CM | POA: Diagnosis not present

## 2015-01-10 DIAGNOSIS — N2581 Secondary hyperparathyroidism of renal origin: Secondary | ICD-10-CM | POA: Diagnosis not present

## 2015-01-10 DIAGNOSIS — D631 Anemia in chronic kidney disease: Secondary | ICD-10-CM | POA: Diagnosis not present

## 2015-01-10 DIAGNOSIS — D509 Iron deficiency anemia, unspecified: Secondary | ICD-10-CM | POA: Diagnosis not present

## 2015-01-10 DIAGNOSIS — A4101 Sepsis due to Methicillin susceptible Staphylococcus aureus: Secondary | ICD-10-CM | POA: Diagnosis not present

## 2015-01-10 DIAGNOSIS — N186 End stage renal disease: Secondary | ICD-10-CM | POA: Diagnosis not present

## 2015-01-12 DIAGNOSIS — D509 Iron deficiency anemia, unspecified: Secondary | ICD-10-CM | POA: Diagnosis not present

## 2015-01-12 DIAGNOSIS — A4101 Sepsis due to Methicillin susceptible Staphylococcus aureus: Secondary | ICD-10-CM | POA: Diagnosis not present

## 2015-01-12 DIAGNOSIS — D631 Anemia in chronic kidney disease: Secondary | ICD-10-CM | POA: Diagnosis not present

## 2015-01-12 DIAGNOSIS — N2581 Secondary hyperparathyroidism of renal origin: Secondary | ICD-10-CM | POA: Diagnosis not present

## 2015-01-12 DIAGNOSIS — N186 End stage renal disease: Secondary | ICD-10-CM | POA: Diagnosis not present

## 2015-01-15 DIAGNOSIS — A4101 Sepsis due to Methicillin susceptible Staphylococcus aureus: Secondary | ICD-10-CM | POA: Diagnosis not present

## 2015-01-15 DIAGNOSIS — N186 End stage renal disease: Secondary | ICD-10-CM | POA: Diagnosis not present

## 2015-01-15 DIAGNOSIS — D509 Iron deficiency anemia, unspecified: Secondary | ICD-10-CM | POA: Diagnosis not present

## 2015-01-15 DIAGNOSIS — D631 Anemia in chronic kidney disease: Secondary | ICD-10-CM | POA: Diagnosis not present

## 2015-01-15 DIAGNOSIS — N2581 Secondary hyperparathyroidism of renal origin: Secondary | ICD-10-CM | POA: Diagnosis not present

## 2015-01-17 DIAGNOSIS — N2581 Secondary hyperparathyroidism of renal origin: Secondary | ICD-10-CM | POA: Diagnosis not present

## 2015-01-17 DIAGNOSIS — A4101 Sepsis due to Methicillin susceptible Staphylococcus aureus: Secondary | ICD-10-CM | POA: Diagnosis not present

## 2015-01-17 DIAGNOSIS — D631 Anemia in chronic kidney disease: Secondary | ICD-10-CM | POA: Diagnosis not present

## 2015-01-17 DIAGNOSIS — N186 End stage renal disease: Secondary | ICD-10-CM | POA: Diagnosis not present

## 2015-01-17 DIAGNOSIS — D509 Iron deficiency anemia, unspecified: Secondary | ICD-10-CM | POA: Diagnosis not present

## 2015-01-18 ENCOUNTER — Encounter: Payer: Self-pay | Admitting: Family Medicine

## 2015-01-18 ENCOUNTER — Ambulatory Visit (INDEPENDENT_AMBULATORY_CARE_PROVIDER_SITE_OTHER): Payer: Medicare Other | Admitting: Family Medicine

## 2015-01-18 VITALS — BP 132/56 | HR 73 | Temp 98.3°F | Wt 130.0 lb

## 2015-01-18 DIAGNOSIS — Z992 Dependence on renal dialysis: Secondary | ICD-10-CM

## 2015-01-18 DIAGNOSIS — E1121 Type 2 diabetes mellitus with diabetic nephropathy: Secondary | ICD-10-CM

## 2015-01-18 DIAGNOSIS — I10 Essential (primary) hypertension: Secondary | ICD-10-CM

## 2015-01-18 DIAGNOSIS — N186 End stage renal disease: Secondary | ICD-10-CM

## 2015-01-18 DIAGNOSIS — K219 Gastro-esophageal reflux disease without esophagitis: Secondary | ICD-10-CM

## 2015-01-18 DIAGNOSIS — R21 Rash and other nonspecific skin eruption: Secondary | ICD-10-CM

## 2015-01-18 DIAGNOSIS — E785 Hyperlipidemia, unspecified: Secondary | ICD-10-CM

## 2015-01-18 DIAGNOSIS — F329 Major depressive disorder, single episode, unspecified: Secondary | ICD-10-CM

## 2015-01-18 DIAGNOSIS — F32A Depression, unspecified: Secondary | ICD-10-CM

## 2015-01-18 NOTE — Patient Instructions (Signed)
Nice to meet you today. May use hydrocortisone cream on left arm skin rash as up to now. Let us or kidney doctors know if not improving as expected. Return as needed or in 6 months for medicare wellness visit

## 2015-01-18 NOTE — Progress Notes (Signed)
Pre visit review using our clinic review tool, if applicable. No additional management support is needed unless otherwise documented below in the visit note. 

## 2015-01-18 NOTE — Assessment & Plan Note (Signed)
Continue low dose lexapro. Started after daughter died 9-10 yrs ago. Stable mood.

## 2015-01-18 NOTE — Assessment & Plan Note (Signed)
Recently working in yard, may have been exposed to poison ivy. Anticipate poison ivy dermatitis with blister - rec continue hydrocortisone cream. Update if not improving with treatment.

## 2015-01-18 NOTE — Assessment & Plan Note (Signed)
Chronic, stable. Continue amlodipine 2.5mg daily.  

## 2015-01-18 NOTE — Assessment & Plan Note (Signed)
Continue f/u with renal. 

## 2015-01-18 NOTE — Progress Notes (Signed)
BP 132/56 mmHg  Pulse 73  Temp(Src) 98.3 F (36.8 C) (Oral)  Wt 130 lb (58.968 kg)  SpO2 97%   CC: new pt to establish  Subjective:    Patient ID: [name], female    DOB: 12-21-1928, 81 y.o.   MRN: [id number]  HPI: HAJRA PORT is a 81 y.o. female presenting on 01/18/2015 for Establish Care   Transfer of care from [facility] Dr Lovell Sheehan.  ESRD on HD - TRSat to [facility] Dialysis sees Dr Arrie Aran, [place]. On HD for last 10 yrs. Has fistula RUE  DM - followed by Dr Talmage Nap endo. Sees podiatrist Q3-4 mo. ?dx. Off meds. Lab Results  Component Value Date   HGBA1C 5.3 11/19/2014     HTN - Compliant with current antihypertensive regimen of amlodipine 2.5mg  daily. Does check blood pressures at dialysis and at home. No low blood pressure readings or symptoms of dizziness/syncope. Denies HA, vision changes, CP/tightness, SOB, leg swelling.  HLD - complaint with simvastatin 40mg  daily. No myalgias.   Depression - on lexapro 5mg  daily for last several years. Started after daughter's death.  Preventative: Flu yearly Prevnar 2014 Td 2014  Relevant past medical, surgical, family and social history reviewed and updated as indicated. Interim medical history since our last visit reviewed. Allergies and medications reviewed and updated. Current Outpatient Prescriptions on File Prior to Visit  Medication Sig  . Acetaminophen (TYLENOL EXTRA STRENGTH) 167 MG/5ML LIQD Take 5 mLs by mouth daily as needed. For pain/fever  . amLODipine (NORVASC) 5 MG tablet Take 2.5 mg by mouth daily. Take half tablet on Sundays mondays wednesdays and fridays  . aspirin 81 MG tablet Take 81 mg by mouth daily.  . cinacalcet (SENSIPAR) 30 MG tablet Take 30 mg by mouth daily.  . Dorzolamide HCl-Timolol Mal (COSOPT OP) Place 1 drop into both eyes daily.   [name] escitalopram (LEXAPRO) 10 MG tablet TAKE ONE-HALF TABLET BY MOUTH ONCE DAILY  . famotidine (PEPCID) 20 MG tablet Take 1 tablet (20 mg total) by mouth 2  (two) times daily.  . folic acid-vitamin b complex-vitamin c-selenium-zinc (DIALYVITE) 3 MG TABS Take 1 tablet by mouth daily.    [name] loperamide (IMODIUM) 2 MG capsule Take 1 capsule (2 mg total) by mouth as needed for diarrhea or loose stools.  . multivitamin (RENA-VIT) TABS tablet Take 1 tablet by mouth at bedtime.  . promethazine (PHENERGAN) 25 MG tablet Take 12.5 mg by mouth every 6 (six) hours as needed for nausea or vomiting.  . quiNINE (QUALAQUIN) 324 MG capsule Take 648 mg by mouth 3 (three) times daily. Take on tuesdays thursdays and saturdays   No current facility-administered medications on file prior to visit.    Review of Systems Per HPI unless specifically indicated above     Objective:    BP 132/56 mmHg  Pulse 73  Temp(Src) 98.3 F (36.8 C) (Oral)  Wt 130 lb (58.968 kg)  SpO2 97%  Wt Readings from Last 3 Encounters:  01/18/15 130 lb (58.968 kg)  11/20/14 128 lb 1.4 oz (58.1 kg)  11/10/13 133 lb (60.328 kg)    Physical Exam  Constitutional: She appears well-developed and well-nourished. No distress.  HENT:  Mouth/Throat: Oropharynx is clear and moist. No oropharyngeal exudate.  Eyes: Conjunctivae and EOM are normal. Pupils are equal, round, and reactive to light.  Cardiovascular: Normal rate, regular rhythm, normal heart sounds and intact distal pulses.   No murmur heard. Pulmonary/Chest: Effort normal and breath sounds normal. No respiratory  distress. She has no wheezes. She has no rales.  Musculoskeletal: She exhibits no edema.  RUE fistula with palpable thrill  Skin: Skin is warm and dry. There is erythema.  Erythematous pruritic blister left arm  Nursing note and vitals reviewed.  Lab Results  Component Value Date   CREATININE 8.36* 11/20/2014    Lab Results  Component Value Date   WBC 6.4 11/20/2014   HGB 9.0* 11/20/2014   HCT 28.9* 11/20/2014   MCV 91.7 11/20/2014   PLT  11/20/2014    PLATELET CLUMPS NOTED ON SMEAR, COUNT APPEARS DECREASED        Assessment & Plan:   Problem List Items Addressed This Visit    Type 2 diabetes with nephropathy    Actually significant improvement, off diabetic medications, anticipate this dx has resolved. Will await records from Dr Talmage Nap to review.      Relevant Medications   simvastatin (ZOCOR) 40 MG tablet   HLD (hyperlipidemia)    Lab Results  Component Value Date   LDLCALC 64 11/10/2013  continue simvastatin  daily.      Relevant Medications   simvastatin (ZOCOR) 40 MG tablet   Essential hypertension    Chronic, stable. Continue amlodipine 2.5mg  daily.      Relevant Medications   simvastatin (ZOCOR) 40 MG tablet   GERD    Continue pepcid  daily.      ESRD on hemodialysis - Primary    Continue f/u with renal.       Depression    Continue low dose lexapro. Started after daughter died 9-10 yrs ago. Stable mood.      Rash and nonspecific skin eruption    Recently working in yard, may have been exposed to poison ivy. Anticipate poison ivy dermatitis with blister - rec continue hydrocortisone cream. Update if not improving with treatment.          Follow up plan: Return in about 6 months (around 07/21/2015), or as needed, for medicare wellness visit.

## 2015-01-18 NOTE — Assessment & Plan Note (Signed)
Actually significant improvement, off diabetic medications, anticipate this dx has resolved. Will await records from Dr Talmage Nap to review.

## 2015-01-18 NOTE — Assessment & Plan Note (Signed)
Lab Results  Component Value Date   LDLCALC 64 11/10/2013  continue simvastatin  daily.

## 2015-01-18 NOTE — Assessment & Plan Note (Signed)
Continue pepcid 20 mg daily.  

## 2015-01-19 DIAGNOSIS — N186 End stage renal disease: Secondary | ICD-10-CM | POA: Diagnosis not present

## 2015-01-19 DIAGNOSIS — N2581 Secondary hyperparathyroidism of renal origin: Secondary | ICD-10-CM | POA: Diagnosis not present

## 2015-01-19 DIAGNOSIS — D509 Iron deficiency anemia, unspecified: Secondary | ICD-10-CM | POA: Diagnosis not present

## 2015-01-19 DIAGNOSIS — A4101 Sepsis due to Methicillin susceptible Staphylococcus aureus: Secondary | ICD-10-CM | POA: Diagnosis not present

## 2015-01-19 DIAGNOSIS — D631 Anemia in chronic kidney disease: Secondary | ICD-10-CM | POA: Diagnosis not present

## 2015-01-22 DIAGNOSIS — N186 End stage renal disease: Secondary | ICD-10-CM | POA: Diagnosis not present

## 2015-01-22 DIAGNOSIS — D631 Anemia in chronic kidney disease: Secondary | ICD-10-CM | POA: Diagnosis not present

## 2015-01-22 DIAGNOSIS — D509 Iron deficiency anemia, unspecified: Secondary | ICD-10-CM | POA: Diagnosis not present

## 2015-01-22 DIAGNOSIS — N2581 Secondary hyperparathyroidism of renal origin: Secondary | ICD-10-CM | POA: Diagnosis not present

## 2015-01-22 DIAGNOSIS — A4101 Sepsis due to Methicillin susceptible Staphylococcus aureus: Secondary | ICD-10-CM | POA: Diagnosis not present

## 2015-01-24 DIAGNOSIS — A4101 Sepsis due to Methicillin susceptible Staphylococcus aureus: Secondary | ICD-10-CM | POA: Diagnosis not present

## 2015-01-24 DIAGNOSIS — D631 Anemia in chronic kidney disease: Secondary | ICD-10-CM | POA: Diagnosis not present

## 2015-01-24 DIAGNOSIS — D509 Iron deficiency anemia, unspecified: Secondary | ICD-10-CM | POA: Diagnosis not present

## 2015-01-24 DIAGNOSIS — N186 End stage renal disease: Secondary | ICD-10-CM | POA: Diagnosis not present

## 2015-01-24 DIAGNOSIS — N2581 Secondary hyperparathyroidism of renal origin: Secondary | ICD-10-CM | POA: Diagnosis not present

## 2015-01-26 DIAGNOSIS — A4101 Sepsis due to Methicillin susceptible Staphylococcus aureus: Secondary | ICD-10-CM | POA: Diagnosis not present

## 2015-01-26 DIAGNOSIS — N186 End stage renal disease: Secondary | ICD-10-CM | POA: Diagnosis not present

## 2015-01-26 DIAGNOSIS — N2581 Secondary hyperparathyroidism of renal origin: Secondary | ICD-10-CM | POA: Diagnosis not present

## 2015-01-26 DIAGNOSIS — D509 Iron deficiency anemia, unspecified: Secondary | ICD-10-CM | POA: Diagnosis not present

## 2015-01-26 DIAGNOSIS — D631 Anemia in chronic kidney disease: Secondary | ICD-10-CM | POA: Diagnosis not present

## 2015-01-29 DIAGNOSIS — D509 Iron deficiency anemia, unspecified: Secondary | ICD-10-CM | POA: Diagnosis not present

## 2015-01-29 DIAGNOSIS — N2581 Secondary hyperparathyroidism of renal origin: Secondary | ICD-10-CM | POA: Diagnosis not present

## 2015-01-29 DIAGNOSIS — A4101 Sepsis due to Methicillin susceptible Staphylococcus aureus: Secondary | ICD-10-CM | POA: Diagnosis not present

## 2015-01-29 DIAGNOSIS — D631 Anemia in chronic kidney disease: Secondary | ICD-10-CM | POA: Diagnosis not present

## 2015-01-29 DIAGNOSIS — N186 End stage renal disease: Secondary | ICD-10-CM | POA: Diagnosis not present

## 2015-01-30 DIAGNOSIS — E1129 Type 2 diabetes mellitus with other diabetic kidney complication: Secondary | ICD-10-CM | POA: Diagnosis not present

## 2015-01-30 DIAGNOSIS — Z992 Dependence on renal dialysis: Secondary | ICD-10-CM | POA: Diagnosis not present

## 2015-01-30 DIAGNOSIS — N186 End stage renal disease: Secondary | ICD-10-CM | POA: Diagnosis not present

## 2015-01-31 DIAGNOSIS — D631 Anemia in chronic kidney disease: Secondary | ICD-10-CM | POA: Diagnosis not present

## 2015-01-31 DIAGNOSIS — N2581 Secondary hyperparathyroidism of renal origin: Secondary | ICD-10-CM | POA: Diagnosis not present

## 2015-01-31 DIAGNOSIS — D509 Iron deficiency anemia, unspecified: Secondary | ICD-10-CM | POA: Diagnosis not present

## 2015-01-31 DIAGNOSIS — Z23 Encounter for immunization: Secondary | ICD-10-CM | POA: Diagnosis not present

## 2015-01-31 DIAGNOSIS — N186 End stage renal disease: Secondary | ICD-10-CM | POA: Diagnosis not present

## 2015-01-31 DIAGNOSIS — R509 Fever, unspecified: Secondary | ICD-10-CM | POA: Diagnosis not present

## 2015-01-31 DIAGNOSIS — B9689 Other specified bacterial agents as the cause of diseases classified elsewhere: Secondary | ICD-10-CM | POA: Diagnosis not present

## 2015-02-01 ENCOUNTER — Ambulatory Visit (INDEPENDENT_AMBULATORY_CARE_PROVIDER_SITE_OTHER): Payer: Medicare Other | Admitting: Family Medicine

## 2015-02-01 ENCOUNTER — Encounter: Payer: Self-pay | Admitting: Family Medicine

## 2015-02-01 ENCOUNTER — Other Ambulatory Visit: Payer: Self-pay | Admitting: *Deleted

## 2015-02-01 ENCOUNTER — Telehealth: Payer: Self-pay | Admitting: *Deleted

## 2015-02-01 VITALS — BP 140/58 | HR 76 | Temp 98.6°F | Wt 134.5 lb

## 2015-02-01 DIAGNOSIS — A0472 Enterocolitis due to Clostridium difficile, not specified as recurrent: Secondary | ICD-10-CM

## 2015-02-01 DIAGNOSIS — N186 End stage renal disease: Secondary | ICD-10-CM | POA: Diagnosis not present

## 2015-02-01 DIAGNOSIS — R197 Diarrhea, unspecified: Secondary | ICD-10-CM

## 2015-02-01 DIAGNOSIS — Z992 Dependence on renal dialysis: Secondary | ICD-10-CM

## 2015-02-01 HISTORY — DX: Enterocolitis due to Clostridium difficile, not specified as recurrent: A04.72

## 2015-02-01 LAB — CBC WITH DIFFERENTIAL/PLATELET
Basophils Absolute: 0 10*3/uL (ref 0.0–0.1)
Basophils Relative: 0.5 % (ref 0.0–3.0)
EOS ABS: 0.2 10*3/uL (ref 0.0–0.7)
Eosinophils Relative: 3.6 % (ref 0.0–5.0)
HCT: 34 % — ABNORMAL LOW (ref 36.0–46.0)
HEMOGLOBIN: 11 g/dL — AB (ref 12.0–15.0)
LYMPHS ABS: 1.5 10*3/uL (ref 0.7–4.0)
Lymphocytes Relative: 29.3 % (ref 12.0–46.0)
MCHC: 32.3 g/dL (ref 30.0–36.0)
MCV: 88.4 fl (ref 78.0–100.0)
Monocytes Absolute: 0.4 10*3/uL (ref 0.1–1.0)
Monocytes Relative: 8.4 % (ref 3.0–12.0)
NEUTROS PCT: 58.2 % (ref 43.0–77.0)
Neutro Abs: 2.9 10*3/uL (ref 1.4–7.7)
Platelets: 124 10*3/uL — ABNORMAL LOW (ref 150.0–400.0)
RBC: 3.84 Mil/uL — ABNORMAL LOW (ref 3.87–5.11)
RDW: 22.5 % — AB (ref 11.5–15.5)
WBC: 5 10*3/uL (ref 4.0–10.5)

## 2015-02-01 LAB — BASIC METABOLIC PANEL
BUN: 26 mg/dL — ABNORMAL HIGH (ref 6–23)
CO2: 34 mEq/L — ABNORMAL HIGH (ref 19–32)
Calcium: 8.9 mg/dL (ref 8.4–10.5)
Chloride: 96 mEq/L (ref 96–112)
Creatinine, Ser: 5.25 mg/dL (ref 0.40–1.20)
GFR: 10.01 mL/min — CL (ref >60.00)
GLUCOSE: 100 mg/dL — AB (ref 70–99)
POTASSIUM: 5.1 meq/L (ref 3.5–5.1)
Sodium: 141 mEq/L (ref 135–145)

## 2015-02-01 MED ORDER — RENA-VITE PO TABS: 1.0000 | ORAL_TABLET | Freq: Every day | ORAL | Status: DC

## 2015-02-01 NOTE — Telephone Encounter (Signed)
Noted. Known ESRD. See results note.

## 2015-02-01 NOTE — Progress Notes (Signed)
BP 140/58 mmHg  Pulse 76  Temp(Src) 98.6 F (37 C) (Oral)  Wt 134 lb 8 oz (61.009 kg)  SpO2 95%   CC: diarrhea  Subjective:    Patient ID: [name], female    DOB: 09/23/1928, 81 y.o.   MRN: [id number]  HPI: [name] is a 81 y.o. female presenting on 02/01/2015 for Diarrhea   Established last month here. Prior saw Dr Lovell Sheehan. Noticing diarrhea over last 2 weeks  - this is how UTIs have presented in the past. Last UTI was 2 months ago. Loose stools 2-3 times daily after each meal. Stool not foul smelling. No blood.   Denies fevers/chills, abd pain, nausea/vomiting. Currently no confusion. Pt overall feeling well. Does not make urine.   Recent hospitalization 10/2014 for staph aureus bacteremia likely from HD cath infection. Initially ?UTI but ultimately not felt responsible for infection. She just completed 6 wks of ancef for this (last abx dose 12/29/2014).   GI bug has been going around but pt's diarrhea is longer than this would be expected. H/o IBS as well.  ESRD on HD - TRSat to [facility] sees Dr Arrie Aran, [place]. On HD for last 10 yrs. Has fistula RUE.   Relevant past medical, surgical, family and social history reviewed and updated as indicated. Interim medical history since our last visit reviewed. Allergies and medications reviewed and updated. Current Outpatient Prescriptions on File Prior to Visit  Medication Sig  . Acetaminophen (TYLENOL EXTRA STRENGTH) 167 MG/5ML LIQD Take 5 mLs by mouth daily as needed. For pain/fever  . amLODipine (NORVASC) 5 MG tablet Take 2.5 mg by mouth daily. Take half tablet on Sundays mondays wednesdays and fridays  . aspirin 81 MG tablet Take 81 mg by mouth daily.  . cinacalcet (SENSIPAR) 30 MG tablet Take 30 mg by mouth daily.  . Dorzolamide HCl-Timolol Mal (COSOPT OP) Place 1 drop into both eyes daily.   [name] escitalopram (LEXAPRO) 10 MG tablet TAKE ONE-HALF TABLET BY MOUTH ONCE DAILY  . famotidine (PEPCID) 20 MG tablet  Take 1 tablet (20 mg total) by mouth 2 (two) times daily.  . folic acid-vitamin b complex-vitamin c-selenium-zinc (DIALYVITE) 3 MG TABS Take 1 tablet by mouth daily.    [name] loperamide (IMODIUM) 2 MG capsule Take 1 capsule (2 mg total) by mouth as needed for diarrhea or loose stools.  . multivitamin (RENA-VIT) TABS tablet Take 1 tablet by mouth at bedtime.  . promethazine (PHENERGAN) 25 MG tablet Take 12.5 mg by mouth every 6 (six) hours as needed for nausea or vomiting.  . quiNINE (QUALAQUIN) 324 MG capsule Take 648 mg by mouth 3 (three) times daily. Take on tuesdays thursdays and saturdays  . simvastatin (ZOCOR) 40 MG tablet Take 40 mg by mouth daily.   No current facility-administered medications on file prior to visit.    Review of Systems Per HPI unless specifically indicated above     Objective:    BP 140/58 mmHg  Pulse 76  Temp(Src) 98.6 F (37 C) (Oral)  Wt 134 lb 8 oz (61.009 kg)  SpO2 95%  Wt Readings from Last 3 Encounters:  02/01/15 134 lb 8 oz (61.009 kg)  01/18/15 130 lb (58.968 kg)  11/20/14 128 lb 1.4 oz (58.1 kg)    Physical Exam  Constitutional: She appears well-developed and well-nourished. No distress.  HENT:  Mouth/Throat: Oropharynx is clear and moist. No oropharyngeal exudate.  Cardiovascular: Normal rate, regular rhythm, normal heart sounds and intact distal pulses.  No murmur heard. Pulmonary/Chest: Effort normal and breath sounds normal. No respiratory distress. She has no wheezes. She has no rales.  Abdominal: Soft. Bowel sounds are normal. She exhibits no distension and no mass. There is no tenderness. There is no rebound and no guarding.  Musculoskeletal: She exhibits no edema.  Skin: Skin is warm and dry. No rash noted.  Nursing note and vitals reviewed.     Assessment & Plan:   Problem List Items Addressed This Visit    ESRD on hemodialysis    Continue HD. Next session is tomorrow.      Diarrhea - Primary    Possible infectious cause after  recent 6 wk ancef antibiotic course - will send home with stool cultures and C diff to further eval. In interim, may treat with immodium.  Does not have UTI sxs - no abd pain, fever, nausea, confusion - doubt UTI related so UA not checked.  Check CBC, BMP today as well. Red flags to seek care over long weekend discussed. Daughter agrees.      Relevant Orders   CBC with Differential/Platelet   Basic metabolic panel   Stool culture   C. difficile GDH and Toxin A/B       Follow up plan: Return if symptoms worsen or fail to improve.

## 2015-02-01 NOTE — Telephone Encounter (Signed)
While in the office pt's daughter wanted to know if you had refilled pt's Rena- Vit multivitamin. I didn't see a refill auth, and since she is a fairly new pt here and you've never refilled I just wanted to make sure it was ok. Is it ok to refill?

## 2015-02-01 NOTE — Progress Notes (Signed)
Pre visit review using our clinic review tool, if applicable. No additional management support is needed unless otherwise documented below in the visit note. 

## 2015-02-01 NOTE — Telephone Encounter (Signed)
Sent in   Thanks

## 2015-02-01 NOTE — Assessment & Plan Note (Signed)
Continue HD. Next session is tomorrow.

## 2015-02-01 NOTE — Telephone Encounter (Signed)
[facility] called with critical results: Creatinine 5.25; BUN 26; GFR 10.01  Dr. Reece Agar notified

## 2015-02-01 NOTE — Patient Instructions (Addendum)
Pass by lab to pick up stool kit and C diff test. labwork today as well. May take immodium 1 tablet daily for diarrhea as needed and monitor symptoms. Let us know right away if fever, confusion, abdominal pain or nausea/vomiting. Good to see you today, call us with quesitons.

## 2015-02-01 NOTE — Assessment & Plan Note (Addendum)
Possible infectious cause after recent 6 wk ancef antibiotic course - will send home with stool cultures and C diff to further eval. In interim, may treat with immodium.  Does not have UTI sxs - no abd pain, fever, nausea, confusion - doubt UTI related so UA not checked.  Check CBC, BMP today as well. Red flags to seek care over long weekend discussed. Daughter agrees.

## 2015-02-02 DIAGNOSIS — D631 Anemia in chronic kidney disease: Secondary | ICD-10-CM | POA: Diagnosis not present

## 2015-02-02 DIAGNOSIS — N186 End stage renal disease: Secondary | ICD-10-CM | POA: Diagnosis not present

## 2015-02-02 DIAGNOSIS — N2581 Secondary hyperparathyroidism of renal origin: Secondary | ICD-10-CM | POA: Diagnosis not present

## 2015-02-02 DIAGNOSIS — D509 Iron deficiency anemia, unspecified: Secondary | ICD-10-CM | POA: Diagnosis not present

## 2015-02-02 DIAGNOSIS — R509 Fever, unspecified: Secondary | ICD-10-CM | POA: Diagnosis not present

## 2015-02-02 DIAGNOSIS — B9689 Other specified bacterial agents as the cause of diseases classified elsewhere: Secondary | ICD-10-CM | POA: Diagnosis not present

## 2015-02-05 DIAGNOSIS — R509 Fever, unspecified: Secondary | ICD-10-CM | POA: Diagnosis not present

## 2015-02-05 DIAGNOSIS — N2581 Secondary hyperparathyroidism of renal origin: Secondary | ICD-10-CM | POA: Diagnosis not present

## 2015-02-05 DIAGNOSIS — N186 End stage renal disease: Secondary | ICD-10-CM | POA: Diagnosis not present

## 2015-02-05 DIAGNOSIS — B9689 Other specified bacterial agents as the cause of diseases classified elsewhere: Secondary | ICD-10-CM | POA: Diagnosis not present

## 2015-02-05 DIAGNOSIS — D509 Iron deficiency anemia, unspecified: Secondary | ICD-10-CM | POA: Diagnosis not present

## 2015-02-05 DIAGNOSIS — D631 Anemia in chronic kidney disease: Secondary | ICD-10-CM | POA: Diagnosis not present

## 2015-02-05 NOTE — Addendum Note (Signed)
Addended by: Alvina Chou on: 02/05/2015 11:16 AM   Modules accepted: Orders

## 2015-02-06 DIAGNOSIS — N186 End stage renal disease: Secondary | ICD-10-CM | POA: Diagnosis not present

## 2015-02-06 DIAGNOSIS — R197 Diarrhea, unspecified: Secondary | ICD-10-CM | POA: Diagnosis not present

## 2015-02-06 DIAGNOSIS — Z992 Dependence on renal dialysis: Secondary | ICD-10-CM | POA: Diagnosis not present

## 2015-02-06 NOTE — Addendum Note (Signed)
Addended by: Alvina Chou on: 02/06/2015 11:19 AM   Modules accepted: Orders

## 2015-02-07 ENCOUNTER — Encounter (HOSPITAL_COMMUNITY): Payer: Self-pay | Admitting: *Deleted

## 2015-02-07 DIAGNOSIS — M1990 Unspecified osteoarthritis, unspecified site: Secondary | ICD-10-CM | POA: Diagnosis not present

## 2015-02-07 DIAGNOSIS — K219 Gastro-esophageal reflux disease without esophagitis: Secondary | ICD-10-CM | POA: Diagnosis not present

## 2015-02-07 DIAGNOSIS — I120 Hypertensive chronic kidney disease with stage 5 chronic kidney disease or end stage renal disease: Secondary | ICD-10-CM | POA: Diagnosis not present

## 2015-02-07 DIAGNOSIS — R011 Cardiac murmur, unspecified: Secondary | ICD-10-CM | POA: Diagnosis not present

## 2015-02-07 DIAGNOSIS — R4182 Altered mental status, unspecified: Secondary | ICD-10-CM | POA: Diagnosis present

## 2015-02-07 DIAGNOSIS — N186 End stage renal disease: Secondary | ICD-10-CM | POA: Insufficient documentation

## 2015-02-07 DIAGNOSIS — Z992 Dependence on renal dialysis: Secondary | ICD-10-CM | POA: Insufficient documentation

## 2015-02-07 DIAGNOSIS — N2581 Secondary hyperparathyroidism of renal origin: Secondary | ICD-10-CM | POA: Diagnosis not present

## 2015-02-07 DIAGNOSIS — N390 Urinary tract infection, site not specified: Secondary | ICD-10-CM | POA: Insufficient documentation

## 2015-02-07 DIAGNOSIS — R410 Disorientation, unspecified: Secondary | ICD-10-CM | POA: Diagnosis not present

## 2015-02-07 DIAGNOSIS — R197 Diarrhea, unspecified: Secondary | ICD-10-CM | POA: Diagnosis not present

## 2015-02-07 DIAGNOSIS — Z79899 Other long term (current) drug therapy: Secondary | ICD-10-CM | POA: Insufficient documentation

## 2015-02-07 DIAGNOSIS — Z7982 Long term (current) use of aspirin: Secondary | ICD-10-CM | POA: Insufficient documentation

## 2015-02-07 DIAGNOSIS — E119 Type 2 diabetes mellitus without complications: Secondary | ICD-10-CM | POA: Insufficient documentation

## 2015-02-07 DIAGNOSIS — B9689 Other specified bacterial agents as the cause of diseases classified elsewhere: Secondary | ICD-10-CM | POA: Diagnosis not present

## 2015-02-07 DIAGNOSIS — D509 Iron deficiency anemia, unspecified: Secondary | ICD-10-CM | POA: Diagnosis not present

## 2015-02-07 DIAGNOSIS — Z8619 Personal history of other infectious and parasitic diseases: Secondary | ICD-10-CM | POA: Insufficient documentation

## 2015-02-07 DIAGNOSIS — E785 Hyperlipidemia, unspecified: Secondary | ICD-10-CM | POA: Diagnosis not present

## 2015-02-07 DIAGNOSIS — R509 Fever, unspecified: Secondary | ICD-10-CM | POA: Diagnosis not present

## 2015-02-07 DIAGNOSIS — D631 Anemia in chronic kidney disease: Secondary | ICD-10-CM | POA: Diagnosis not present

## 2015-02-07 DIAGNOSIS — B964 Proteus (mirabilis) (morganii) as the cause of diseases classified elsewhere: Secondary | ICD-10-CM | POA: Diagnosis not present

## 2015-02-07 LAB — C. DIFFICILE GDH AND TOXIN A/B

## 2015-02-07 NOTE — ED Notes (Signed)
pts daughter thinks that pt has a UTI. States that pt is a renal pt and urine can only be obtained by a cath. Sates that her mother is having loose stools which usually means she has a UTI.

## 2015-02-08 ENCOUNTER — Emergency Department (HOSPITAL_COMMUNITY)
Admission: EM | Admit: 2015-02-08 | Discharge: 2015-02-08 | Disposition: A | Discharge status: Home / Self Care | Payer: Medicare Other | Attending: Emergency Medicine | Admitting: Emergency Medicine

## 2015-02-08 ENCOUNTER — Telehealth: Payer: Self-pay

## 2015-02-08 DIAGNOSIS — R410 Disorientation, unspecified: Secondary | ICD-10-CM | POA: Diagnosis not present

## 2015-02-08 DIAGNOSIS — N390 Urinary tract infection, site not specified: Secondary | ICD-10-CM

## 2015-02-08 LAB — URINALYSIS, ROUTINE W REFLEX MICROSCOPIC
Glucose, UA: 100 mg/dL — AB
KETONES UR: NEGATIVE mg/dL
NITRITE: POSITIVE — AB
Protein, ur: 100 mg/dL — AB
SPECIFIC GRAVITY, URINE: 1.025 (ref 1.005–1.030)
UROBILINOGEN UA: 0.2 mg/dL (ref 0.0–1.0)
pH: 8 (ref 5.0–8.0)

## 2015-02-08 LAB — URINE MICROSCOPIC-ADD ON

## 2015-02-08 MED ORDER — CEPHALEXIN 500 MG PO CAPS: 500.0000 mg | ORAL_CAPSULE | Freq: Two times a day (BID) | ORAL | Status: DC

## 2015-02-08 MED ORDER — CEPHALEXIN 250 MG PO CAPS
500.0000 mg | ORAL_CAPSULE | Freq: Once | ORAL | Status: AC
  Administered 2015-02-08: 500 mg via ORAL
  Filled 2015-02-08: qty 2

## 2015-02-08 NOTE — Telephone Encounter (Signed)
Pt is at [facility] ED on 02/08/15.

## 2015-02-08 NOTE — ED Provider Notes (Signed)
CSN: [id number]     Arrival date & time 02/07/15  2246 History   This chart was scribed for Tomasita Crumble, MD by Evon Slack, ED Scribe. This patient was seen in room A12C/A12C and the patient's care was started at 2:33 AM.    Chief Complaint  Patient presents with  . Urinary Tract Infection    Patient is a 81 y.o. female presenting with altered mental status. The history is provided by a relative. No language interpreter was used.  Altered Mental Status Presenting symptoms: confusion   Severity:  Moderate Most recent episode:  Today Episode history:  Continuous Duration:  2 weeks Timing:  Sporadic Associated symptoms: no vomiting    HPI Comments:  [name] is a 81 y.o. female who presents to the Emergency Department complaining of confusion onset 2 weeks prior. Daughter states that any time that she is confused she normally has an UTI. Daughter states that she has had diarrhea as well. Daughter states that she is a dialysis pt. Pt denies vomiting.   Past Medical History  Diagnosis Date  . Cough   . Diabetes mellitus     type 2  . GERD (gastroesophageal reflux disease)   . Hypertension   . Bronchitis   . Osteoarthritis   . Dyslipidemia     remote hx/notes 10/06/2009  . ESRD (end stage renal disease) on dialysis     Dr Coladonato/[facility]  . Depression     lexapro  . Staphylococcus aureus bacteremia with sepsis     thought from HD catheter   Past Surgical History  Procedure Laterality Date  . Eye surgery      Cataract extraction  . Thrombectomy and revision of arterioventous (av) goretex  graft Left 12/2004; 01/2006; 03/12/2009    forearmnotes 10/13/2010; forearmnotes 10/13/2010; upper arm/notes 03/19/2009  . Abdominal hysterectomy      remote hx/notes 10/06/2009  . Vitrectomy Left 03/2001    with membrane peel/notes 10/13/2010  . Arteriovenous graft placement Left 07/2004    forearm/notes 10/13/2010  . Arteriovenous graft placement Left 03/2006    upper armnotes  10/13/2010   Family History  Problem Relation Age of Onset  . Hypertension Mother    Social History  Substance Use Topics  . Smoking status: Never Smoker   . Smokeless tobacco: None  . Alcohol Use: No   OB History    No data available      Review of Systems  Gastrointestinal: Positive for diarrhea. Negative for vomiting.  Psychiatric/Behavioral: Positive for confusion.  All other systems reviewed and are negative.    Allergies  Review of patient's allergies indicates no known allergies.  Home Medications   Prior to Admission medications   Medication Sig Start Date End Date Taking? Authorizing Provider  Acetaminophen (TYLENOL EXTRA STRENGTH) 167 MG/5ML LIQD Take 5 mLs by mouth daily as needed. For pain/fever    Historical Provider, MD  amLODipine (NORVASC) 5 MG tablet Take 2.5 mg by mouth daily. Take half tablet on Sundays mondays wednesdays and fridays 04/05/12   Eulis Foster, FNP  aspirin 81 MG tablet Take 81 mg by mouth daily.    Historical Provider, MD  cinacalcet (SENSIPAR) 30 MG tablet Take 30 mg by mouth daily.    Historical Provider, MD  Dorzolamide HCl-Timolol Mal (COSOPT OP) Place 1 drop into both eyes daily.     Historical Provider, MD  escitalopram (LEXAPRO) 10 MG tablet TAKE ONE-HALF TABLET BY MOUTH ONCE DAILY 02/07/14   Stacie Glaze,  MD  famotidine (PEPCID) 20 MG tablet Take 1 tablet (20 mg total) by mouth 2 (two) times daily. 11/20/14   Calvert Cantor, MD  folic acid-vitamin b complex-vitamin c-selenium-zinc (DIALYVITE) 3 MG TABS Take 1 tablet by mouth daily.      Historical Provider, MD  loperamide (IMODIUM) 2 MG capsule Take 1 capsule (2 mg total) by mouth as needed for diarrhea or loose stools. 07/28/13   Maryann Mikhail, DO  multivitamin (RENA-VIT) TABS tablet Take 1 tablet by mouth at bedtime. 02/01/15   Eustaquio Boyden, MD  promethazine (PHENERGAN) 25 MG tablet Take 12.5 mg by mouth every 6 (six) hours as needed for nausea or vomiting.    Historical Provider,  MD  quiNINE (QUALAQUIN) 324 MG capsule Take 648 mg by mouth 3 (three) times daily. Take on tuesdays thursdays and saturdays    Historical Provider, MD  simvastatin (ZOCOR) 40 MG tablet Take 40 mg by mouth daily.    Historical Provider, MD   BP 125/56 mmHg  Pulse 80  Temp(Src) 99.5 F (37.5 C) (Oral)  Resp 18  Ht 5\' 2"  (1.575 m)  Wt 134 lb (60.782 kg)  BMI 24.50 kg/m2  SpO2 96%   Physical Exam  Constitutional: She is oriented to person, place, and time. She appears well-developed and well-nourished. No distress.  HENT:  Head: Normocephalic and atraumatic.  Nose: Nose normal.  Mouth/Throat: Oropharynx is clear and moist. No oropharyngeal exudate.  Eyes: Conjunctivae and EOM are normal. Pupils are equal, round, and reactive to light. No scleral icterus.  Neck: Normal range of motion. Neck supple. No JVD present. No tracheal deviation present. No thyromegaly present.  Cardiovascular: Normal rate and regular rhythm.  Exam reveals gallop and S4. Exam reveals no friction rub.   Murmur heard.  Systolic murmur is present  Pulmonary/Chest: Effort normal and breath sounds normal. No respiratory distress. She has no wheezes. She exhibits no tenderness.  Abdominal: Soft. Bowel sounds are normal. She exhibits no distension and no mass. There is no tenderness. There is no rebound and no guarding.  Musculoskeletal: Normal range of motion. She exhibits no edema or tenderness.  RUE Av fistula with palpable thrill.   Lymphadenopathy:    She has no cervical adenopathy.  Neurological: She is alert and oriented to person, place, and time. No cranial nerve deficit. She exhibits normal muscle tone.  Skin: Skin is warm and dry. No rash noted. No erythema. No pallor.  Nursing note and vitals reviewed.   ED Course  Procedures (including critical care time) DIAGNOSTIC STUDIES: Oxygen Saturation is 96% on RA, normal by my interpretation.    COORDINATION OF CARE:  Labs Review Labs Reviewed   URINALYSIS, ROUTINE W REFLEX MICROSCOPIC (NOT AT [facility]) - Abnormal; Notable for the following:    APPearance TURBID (*)    Glucose, UA 100 (*)    Hgb urine dipstick MODERATE (*)    Bilirubin Urine MODERATE (*)    Protein, ur 100 (*)    Nitrite POSITIVE (*)    Leukocytes, UA MODERATE (*)    All other components within normal limits  URINE MICROSCOPIC-ADD ON - Abnormal; Notable for the following:    Bacteria, UA MANY (*)    All other components within normal limits  URINE CULTURE    Imaging Review No results found. I have personally reviewed and evaluated these images and lab results as part of my medical decision-making.   EKG Interpretation None      MDM   Final diagnoses:  None    Patient presents to the ED for confusion and diarrrhea.  Diarrhea has been worked up by PCP, cultures are negative thus far.  UA shows infection, consistent with daughters concerns.  She states keflex normally works for her.  She was given one dose in the ED, will send home with a Rx and PCP follow up.  Her VS remain within her normal limits and she is safe for DC.  I personally performed the services described in this documentation, which was scribed in my presence. The recorded information has been reviewed and is accurate.      Tomasita Crumble, MD 02/08/15 [phone]

## 2015-02-08 NOTE — Telephone Encounter (Signed)
PLEASE NOTE: All timestamps contained within this report are represented as [place] Standard Time. CONFIDENTIALTY NOTICE: This fax transmission is intended only for the addressee. It contains information that is legally privileged, confidential or otherwise protected from use or disclosure. If you are not the intended recipient, you are strictly prohibited from reviewing, disclosing, copying using or disseminating any of this information or taking any action in reliance on or regarding this information. If you have received this fax in error, please notify us immediately by telephone so that we can arrange for its return to [place]. Phone: [phone], Toll-Free: [phone], Fax: [phone] Page: 1 of 2 Call Id: [id number] [facility] Night - Client TELEPHONE ADVICE RECORD [facility] Patient Name: NIDYA BOUYER Gender: Female DOB: [birth year] Age: 81 Y 6 M 26 D Return Phone Number: [phone] (Primary), [phone] (Secondary) Address: City/State/Zip: [place] Client [facility] Night - Client Client Site [facility] - Night Physician Eustaquio Boyden Contact Type Call Call Type Triage / Clinical Caller Name Mickle Asper Relationship To Patient Daughter Return Phone Number [phone] (Primary) Chief Complaint CONFUSION - new onset Initial Comment Seen for possible UTI, she is now acting confused PreDisposition Call Doctor Nurse Assessment Nurse: Freida Busman, RN, Delorise Shiner Date/Time Lamount Cohen Time): 02/07/2015 9:59:19 PM Confirm and document reason for call. If symptomatic, describe symptoms. ---Caller states pt was seen for possible UTI on 02/01/2015, and she is now acting confused. Pt is on dialysis. Has the patient traveled out of the country within the last 30 days? ---Not Applicable Does the patient require triage? ---Yes Related visit to physician within the last 2 weeks? ---Yes Does the PT have any  chronic conditions? (i.e. diabetes, asthma, etc.) ---Yes List chronic conditions. ---renal failure, HTN, NIDDM, Guidelines Guideline Title Affirmed Question Affirmed Notes Nurse Date/Time (Eastern Time) Confusion - Delirium [1] Acting confused (e.g., disoriented, slurred speech) AND [2] brief (now gone) Freida Busman, Charity fundraiser, Delorise Shiner 02/07/2015 10:09:28 PM Disp. Time Lamount Cohen Time) Disposition Final User 02/07/2015 9:42:46 PM Send to Urgent [place] [place] 02/07/2015 10:12:07 PM See Physician within 4 Hours (or PCP triage) Yes Freida Busman, RN, Lacretia Nicks Understands: Yes Disagree/Comply: Comply PLEASE NOTE: All timestamps contained within this report are represented as [place] Standard Time. CONFIDENTIALTY NOTICE: This fax transmission is intended only for the addressee. It contains information that is legally privileged, confidential or otherwise protected from use or disclosure. If you are not the intended recipient, you are strictly prohibited from reviewing, disclosing, copying using or disseminating any of this information or taking any action in reliance on or regarding this information. If you have received this fax in error, please notify us immediately by telephone so that we can arrange for its return to [place]. Phone: [phone], Toll-Free: [phone], Fax: [phone] Page: 2 of 2 Call Id: [id number] Care Advice Given Per Guideline SEE PHYSICIAN WITHIN 4 HOURS (or PCP triage): * IF OFFICE WILL BE OPEN: You need to be seen within the next 3 or 4 hours. Call your doctor's office now or as soon as it opens. CALL BACK IF: * You become worse. CARE ADVICE given per Confusion-Delirium (Adult) guideline. After Care Instructions Given Call Event Type User Date / Time Description Referrals GO TO FACILITY UNDECIDED

## 2015-02-08 NOTE — Discharge Instructions (Signed)
Urinary Tract Infection Ms. Busler, take keflex for treatment of your urine infection.  See a primary care doctor within 3 days for close follow up.  Your stool culture so far is normal.  If any symptoms worsen, come back to the ED immediately.  Thank you. A urinary tract infection (UTI) can occur any place along the urinary tract. The tract includes the kidneys, ureters, bladder, and urethra. A type of germ called bacteria often causes a UTI. UTIs are often helped with antibiotic medicine.  HOME CARE   If given, take antibiotics as told by your doctor. Finish them even if you start to feel better.  Drink enough fluids to keep your pee (urine) clear or pale yellow.  Avoid tea, drinks with caffeine, and bubbly (carbonated) drinks.  Pee often. Avoid holding your pee in for a long time.  Pee before and after having sex (intercourse).  Wipe from front to back after you poop (bowel movement) if you are a woman. Use each tissue only once. GET HELP RIGHT AWAY IF:   You have back pain.  You have lower belly (abdominal) pain.  You have chills.  You feel sick to your stomach (nauseous).  You throw up (vomit).  Your burning or discomfort with peeing does not go away.  You have a fever.  Your symptoms are not better in 3 days. MAKE SURE YOU:   Understand these instructions.  Will watch your condition.  Will get help right away if you are not doing well or get worse. Document Released: 11/04/2007 Document Revised: 02/10/2012 Document Reviewed: 12/17/2011 [facility] Patient Information 2015 [place], [place]. This information is not intended to replace advice given to you by your health care provider. Make sure you discuss any questions you have with your health care provider.

## 2015-02-09 DIAGNOSIS — N2581 Secondary hyperparathyroidism of renal origin: Secondary | ICD-10-CM | POA: Diagnosis not present

## 2015-02-09 DIAGNOSIS — D509 Iron deficiency anemia, unspecified: Secondary | ICD-10-CM | POA: Diagnosis not present

## 2015-02-09 DIAGNOSIS — D631 Anemia in chronic kidney disease: Secondary | ICD-10-CM | POA: Diagnosis not present

## 2015-02-09 DIAGNOSIS — R509 Fever, unspecified: Secondary | ICD-10-CM | POA: Diagnosis not present

## 2015-02-09 DIAGNOSIS — B9689 Other specified bacterial agents as the cause of diseases classified elsewhere: Secondary | ICD-10-CM | POA: Diagnosis not present

## 2015-02-09 DIAGNOSIS — N186 End stage renal disease: Secondary | ICD-10-CM | POA: Diagnosis not present

## 2015-02-10 LAB — STOOL CULTURE

## 2015-02-10 LAB — URINE CULTURE

## 2015-02-11 ENCOUNTER — Telehealth (HOSPITAL_COMMUNITY): Payer: Self-pay

## 2015-02-11 NOTE — Telephone Encounter (Signed)
Post ED Visit - Positive Culture Follow-up  Culture report reviewed by antimicrobial stewardship pharmacist:   Celedonio Miyamoto, Pharm.D., BCPS  Georgina Pillion, Pharm.D., BCPS  [place], [place].D., BCPS, AAHIVP  Estella Husk, Pharm.D., BCPS, AAHIVP  [place], [address].D.  Tennis Must, Pharm.D.  Positive urine culture Treated with cephalexin, organism sensitive to the same and no further patient follow-up is required at this time.  Ashley Jacobs 02/11/2015, 9:19 AM

## 2015-02-11 NOTE — Telephone Encounter (Signed)
Noted  

## 2015-02-12 DIAGNOSIS — D631 Anemia in chronic kidney disease: Secondary | ICD-10-CM | POA: Diagnosis not present

## 2015-02-12 DIAGNOSIS — B9689 Other specified bacterial agents as the cause of diseases classified elsewhere: Secondary | ICD-10-CM | POA: Diagnosis not present

## 2015-02-12 DIAGNOSIS — R509 Fever, unspecified: Secondary | ICD-10-CM | POA: Diagnosis not present

## 2015-02-12 DIAGNOSIS — D509 Iron deficiency anemia, unspecified: Secondary | ICD-10-CM | POA: Diagnosis not present

## 2015-02-12 DIAGNOSIS — N2581 Secondary hyperparathyroidism of renal origin: Secondary | ICD-10-CM | POA: Diagnosis not present

## 2015-02-12 DIAGNOSIS — N186 End stage renal disease: Secondary | ICD-10-CM | POA: Diagnosis not present

## 2015-02-13 ENCOUNTER — Other Ambulatory Visit: Payer: Self-pay | Admitting: Vascular Surgery

## 2015-02-14 DIAGNOSIS — R509 Fever, unspecified: Secondary | ICD-10-CM | POA: Diagnosis not present

## 2015-02-14 DIAGNOSIS — D631 Anemia in chronic kidney disease: Secondary | ICD-10-CM | POA: Diagnosis not present

## 2015-02-14 DIAGNOSIS — N186 End stage renal disease: Secondary | ICD-10-CM | POA: Diagnosis not present

## 2015-02-14 DIAGNOSIS — N2581 Secondary hyperparathyroidism of renal origin: Secondary | ICD-10-CM | POA: Diagnosis not present

## 2015-02-14 DIAGNOSIS — B9689 Other specified bacterial agents as the cause of diseases classified elsewhere: Secondary | ICD-10-CM | POA: Diagnosis not present

## 2015-02-14 DIAGNOSIS — D509 Iron deficiency anemia, unspecified: Secondary | ICD-10-CM | POA: Diagnosis not present

## 2015-02-16 DIAGNOSIS — D631 Anemia in chronic kidney disease: Secondary | ICD-10-CM | POA: Diagnosis not present

## 2015-02-16 DIAGNOSIS — B9689 Other specified bacterial agents as the cause of diseases classified elsewhere: Secondary | ICD-10-CM | POA: Diagnosis not present

## 2015-02-16 DIAGNOSIS — N2581 Secondary hyperparathyroidism of renal origin: Secondary | ICD-10-CM | POA: Diagnosis not present

## 2015-02-16 DIAGNOSIS — R509 Fever, unspecified: Secondary | ICD-10-CM | POA: Diagnosis not present

## 2015-02-16 DIAGNOSIS — D509 Iron deficiency anemia, unspecified: Secondary | ICD-10-CM | POA: Diagnosis not present

## 2015-02-16 DIAGNOSIS — N186 End stage renal disease: Secondary | ICD-10-CM | POA: Diagnosis not present

## 2015-02-18 ENCOUNTER — Encounter: Payer: Self-pay | Admitting: *Deleted

## 2015-02-18 ENCOUNTER — Encounter
Admission: RE | Disposition: A | Discharge status: Home / Self Care | Payer: Self-pay | Source: Ambulatory Visit | Attending: Vascular Surgery

## 2015-02-18 ENCOUNTER — Ambulatory Visit
Admission: RE | Admit: 2015-02-18 | Discharge: 2015-02-18 | Disposition: A | Discharge status: Home / Self Care | Payer: Medicare Other | Source: Ambulatory Visit | Attending: Vascular Surgery | Admitting: Vascular Surgery

## 2015-02-18 DIAGNOSIS — R05 Cough: Secondary | ICD-10-CM | POA: Insufficient documentation

## 2015-02-18 DIAGNOSIS — I10 Essential (primary) hypertension: Secondary | ICD-10-CM | POA: Insufficient documentation

## 2015-02-18 DIAGNOSIS — Z79899 Other long term (current) drug therapy: Secondary | ICD-10-CM | POA: Insufficient documentation

## 2015-02-18 DIAGNOSIS — T82858A Stenosis of vascular prosthetic devices, implants and grafts, initial encounter: Secondary | ICD-10-CM | POA: Diagnosis not present

## 2015-02-18 DIAGNOSIS — K219 Gastro-esophageal reflux disease without esophagitis: Secondary | ICD-10-CM | POA: Insufficient documentation

## 2015-02-18 DIAGNOSIS — E785 Hyperlipidemia, unspecified: Secondary | ICD-10-CM | POA: Diagnosis not present

## 2015-02-18 DIAGNOSIS — Y832 Surgical operation with anastomosis, bypass or graft as the cause of abnormal reaction of the patient, or of later complication, without mention of misadventure at the time of the procedure: Secondary | ICD-10-CM | POA: Diagnosis not present

## 2015-02-18 DIAGNOSIS — T82838A Hemorrhage of vascular prosthetic devices, implants and grafts, initial encounter: Secondary | ICD-10-CM | POA: Diagnosis not present

## 2015-02-18 DIAGNOSIS — Z7982 Long term (current) use of aspirin: Secondary | ICD-10-CM | POA: Diagnosis not present

## 2015-02-18 DIAGNOSIS — Z992 Dependence on renal dialysis: Secondary | ICD-10-CM | POA: Insufficient documentation

## 2015-02-18 DIAGNOSIS — M1990 Unspecified osteoarthritis, unspecified site: Secondary | ICD-10-CM | POA: Diagnosis not present

## 2015-02-18 DIAGNOSIS — N186 End stage renal disease: Secondary | ICD-10-CM | POA: Insufficient documentation

## 2015-02-18 DIAGNOSIS — E119 Type 2 diabetes mellitus without complications: Secondary | ICD-10-CM | POA: Insufficient documentation

## 2015-02-18 HISTORY — PX: PERIPHERAL VASCULAR CATHETERIZATION: SHX172C

## 2015-02-18 LAB — POTASSIUM (ARMC VASCULAR LAB ONLY): POTASSIUM (ARMC VASCULAR LAB): 4.5

## 2015-02-18 SURGERY — A/V SHUNTOGRAM/FISTULAGRAM
Anesthesia: Moderate Sedation | Laterality: Right

## 2015-02-18 MED ORDER — GUAIFENESIN-DM 100-10 MG/5ML PO SYRP: 15.0000 mL | ORAL_SOLUTION | ORAL | Status: DC | PRN

## 2015-02-18 MED ORDER — HEPARIN (PORCINE) IN NACL 2-0.9 UNIT/ML-% IJ SOLN
INTRAMUSCULAR | Status: AC
  Filled 2015-02-18: qty 1000

## 2015-02-18 MED ORDER — LIDOCAINE HCL (PF) 1 % IJ SOLN
INTRAMUSCULAR | Status: DC | PRN
  Administered 2015-02-18: 10 mL

## 2015-02-18 MED ORDER — PHENOL 1.4 % MT LIQD: 1.0000 | OROMUCOSAL | Status: DC | PRN

## 2015-02-18 MED ORDER — IOHEXOL 300 MG/ML  SOLN
INTRAMUSCULAR | Status: DC | PRN
  Administered 2015-02-18: 25 mL via INTRAVENOUS

## 2015-02-18 MED ORDER — ACETAMINOPHEN 325 MG PO TABS: 325.0000 mg | ORAL_TABLET | ORAL | Status: DC | PRN

## 2015-02-18 MED ORDER — HEPARIN SODIUM (PORCINE) 1000 UNIT/ML IJ SOLN
INTRAMUSCULAR | Status: AC
  Filled 2015-02-18: qty 1

## 2015-02-18 MED ORDER — DEXTROSE 5 % IV SOLN
1.5000 g | INTRAVENOUS | Status: DC
  Filled 2015-02-18: qty 1.5

## 2015-02-18 MED ORDER — METOPROLOL TARTRATE 1 MG/ML IV SOLN: 2.0000 mg | INTRAVENOUS | Status: DC | PRN

## 2015-02-18 MED ORDER — MORPHINE SULFATE (PF) 4 MG/ML IV SOLN: 2.0000 mg | INTRAVENOUS | Status: DC | PRN

## 2015-02-18 MED ORDER — LABETALOL HCL 5 MG/ML IV SOLN: 10.0000 mg | INTRAVENOUS | Status: DC | PRN

## 2015-02-18 MED ORDER — SODIUM CHLORIDE 0.9 % IV SOLN: 500.0000 mL | Freq: Once | INTRAVENOUS | Status: DC | PRN

## 2015-02-18 MED ORDER — OXYCODONE-ACETAMINOPHEN 5-325 MG PO TABS: 1.0000 | ORAL_TABLET | ORAL | Status: DC | PRN

## 2015-02-18 MED ORDER — ALUM & MAG HYDROXIDE-SIMETH 200-200-20 MG/5ML PO SUSP: 15.0000 mL | ORAL | Status: DC | PRN

## 2015-02-18 MED ORDER — CEFAZOLIN SODIUM 1-5 GM-% IV SOLN
1.0000 g | Freq: Once | INTRAVENOUS | Status: AC
  Administered 2015-02-18: 1 g via INTRAVENOUS

## 2015-02-18 MED ORDER — ONDANSETRON HCL 4 MG/2ML IJ SOLN: 4.0000 mg | Freq: Four times a day (QID) | INTRAMUSCULAR | Status: DC | PRN

## 2015-02-18 MED ORDER — HYDRALAZINE HCL 20 MG/ML IJ SOLN: 5.0000 mg | INTRAMUSCULAR | Status: DC | PRN

## 2015-02-18 MED ORDER — CHLORHEXIDINE GLUCONATE CLOTH 2 % EX PADS: 6.0000 | MEDICATED_PAD | Freq: Once | CUTANEOUS | Status: DC

## 2015-02-18 MED ORDER — HEPARIN SODIUM (PORCINE) 1000 UNIT/ML IJ SOLN
INTRAMUSCULAR | Status: DC | PRN
  Administered 2015-02-18: 3000 [IU] via INTRAVENOUS

## 2015-02-18 MED ORDER — MIDAZOLAM HCL 2 MG/2ML IJ SOLN
INTRAMUSCULAR | Status: DC | PRN
  Administered 2015-02-18: 2 mg via INTRAVENOUS

## 2015-02-18 MED ORDER — ACETAMINOPHEN 325 MG RE SUPP: 325.0000 mg | RECTAL | Status: DC | PRN

## 2015-02-18 MED ORDER — LIDOCAINE-EPINEPHRINE (PF) 1 %-1:200000 IJ SOLN
INTRAMUSCULAR | Status: AC
  Filled 2015-02-18: qty 30

## 2015-02-18 MED ORDER — SODIUM CHLORIDE 0.9 % IV SOLN
INTRAVENOUS | Status: DC
  Administered 2015-02-18: 13:00:00 via INTRAVENOUS

## 2015-02-18 MED ORDER — MIDAZOLAM HCL 2 MG/2ML IJ SOLN
INTRAMUSCULAR | Status: AC
  Filled 2015-02-18: qty 2

## 2015-02-18 MED ORDER — FENTANYL CITRATE (PF) 100 MCG/2ML IJ SOLN
INTRAMUSCULAR | Status: AC
  Filled 2015-02-18: qty 2

## 2015-02-18 SURGICAL SUPPLY — 6 items
CANNULA 5F STIFF (CANNULA) ×4 IMPLANT
CATH TORCON 5FR 0.38 (CATHETERS) ×4 IMPLANT
PACK ANGIOGRAPHY (CUSTOM PROCEDURE TRAY) ×4 IMPLANT
SHEATH BRITE TIP 6FRX5.5 (SHEATH) ×4 IMPLANT
TOWEL OR 17X26 4PK STRL BLUE (TOWEL DISPOSABLE) ×4 IMPLANT
WIRE MAGIC TORQUE 260C (WIRE) ×4 IMPLANT

## 2015-02-18 NOTE — H&P (Signed)
  [facility] History & Physical Update  The patient was interviewed and re-examined.  The patient's previous History and Physical has been reviewed and is unchanged.  There is no change in the plan of care. We plan to proceed with the scheduled procedure.  [name],[name], MD  02/18/2015, 1:16 PM

## 2015-02-18 NOTE — Op Note (Signed)
[facility]    OPERATIVE NOTE   PROCEDURE: 1.  Right arm HeRO graft cannulation under ultrasound guidance 2.  Right arm shuntogram   PRE-OPERATIVE DIAGNOSIS: 1. ESRD 2. Malfunctioning right arm HeRO graft with bleeding  POST-OPERATIVE DIAGNOSIS: same as above   SURGEON: Festus Barren, MD  ANESTHESIA: local with MCS  ESTIMATED BLOOD LOSS: minimal  FINDING(S): 1. No significant stenosis or pseudoaneurysms within the graft  SPECIMEN(S):  None  CONTRAST: 25 cc  INDICATIONS: [name] is a 81 y.o. female who presents with malfunctioning right arm HeRO graft with bleeding.  The patient is scheduled for right arm shuntogram.  The patient is aware the risks include but are not limited to: bleeding, infection, thrombosis of the cannulated access, and possible anaphylactic reaction to the contrast.  The patient is aware of the risks of the procedure and elects to proceed forward.  DESCRIPTION: After full informed written consent was obtained, the patient was brought back to the angiography suite and placed supine upon the angiography table.  The patient was connected to monitoring equipment.  The right arm was prepped and draped in the standard fashion for a percutaneous access intervention.  Under ultrasound guidance, the right arm HeRO graft was cannulated with a micropuncture needle under direct ultrasound guidance in a retrograde fashion in the proximal upper arm and a permanent image was performed.  The microwire was advanced into the fistula and the needle was exchanged for the a microsheath.  I then upsized to a 6 Fr Sheath and then advanced a wire and Kumpe catheter to the anastomosis and imaging was performed.  Hand injections were completed to image the access including the central venous system. This demonstrated a patent right arm HeRO  Graft with several stents in place at the access sites. There were no pseudoaneurysms identified or active bleeding seen.  There was no stenosis greater than 20 or 25% within the stents or at any point within the graft.  Based on the imaging, intervention is not necessary.  The wire was removed from the sheath.  A 4-0 Monocryl purse-string suture was sewn around the sheath.  The sheath was removed while tying down the suture.  A sterile bandage was applied to the puncture site.  COMPLICATIONS: None  CONDITION: Stable   [name],[name]  02/18/2015 2:44 PM

## 2015-02-19 ENCOUNTER — Encounter: Payer: Self-pay | Admitting: Vascular Surgery

## 2015-02-19 DIAGNOSIS — B9689 Other specified bacterial agents as the cause of diseases classified elsewhere: Secondary | ICD-10-CM | POA: Diagnosis not present

## 2015-02-19 DIAGNOSIS — N186 End stage renal disease: Secondary | ICD-10-CM | POA: Diagnosis not present

## 2015-02-19 DIAGNOSIS — R509 Fever, unspecified: Secondary | ICD-10-CM | POA: Diagnosis not present

## 2015-02-19 DIAGNOSIS — D631 Anemia in chronic kidney disease: Secondary | ICD-10-CM | POA: Diagnosis not present

## 2015-02-19 DIAGNOSIS — N2581 Secondary hyperparathyroidism of renal origin: Secondary | ICD-10-CM | POA: Diagnosis not present

## 2015-02-19 DIAGNOSIS — D509 Iron deficiency anemia, unspecified: Secondary | ICD-10-CM | POA: Diagnosis not present

## 2015-02-21 DIAGNOSIS — B9689 Other specified bacterial agents as the cause of diseases classified elsewhere: Secondary | ICD-10-CM | POA: Diagnosis not present

## 2015-02-21 DIAGNOSIS — N2581 Secondary hyperparathyroidism of renal origin: Secondary | ICD-10-CM | POA: Diagnosis not present

## 2015-02-21 DIAGNOSIS — D509 Iron deficiency anemia, unspecified: Secondary | ICD-10-CM | POA: Diagnosis not present

## 2015-02-21 DIAGNOSIS — R509 Fever, unspecified: Secondary | ICD-10-CM | POA: Diagnosis not present

## 2015-02-21 DIAGNOSIS — D631 Anemia in chronic kidney disease: Secondary | ICD-10-CM | POA: Diagnosis not present

## 2015-02-21 DIAGNOSIS — N186 End stage renal disease: Secondary | ICD-10-CM | POA: Diagnosis not present

## 2015-02-22 NOTE — H&P (Cosign Needed)
[facility] Admission History & Physical  MRN : [id number]  [name] is a 81 y.o. (04-20-1929) female who presents with chief complaint of No chief complaint on file. [name]  History of Present Illness: Patient sent from dialysis center for prolonged bleeding.  No other complaints  No current facility-administered medications for this encounter.   Current Outpatient Prescriptions  Medication Sig Dispense Refill  . Acetaminophen (TYLENOL EXTRA STRENGTH) 167 MG/5ML LIQD Take 5 mLs by mouth daily as needed. For pain/fever    . amLODipine (NORVASC) 5 MG tablet Take 2.5 mg by mouth daily. Take half tablet on Sundays mondays wednesdays and fridays    . aspirin 81 MG tablet Take 81 mg by mouth daily.    . cinacalcet (SENSIPAR) 30 MG tablet Take 30 mg by mouth daily.    . Dorzolamide HCl-Timolol Mal (COSOPT OP) Place 1 drop into both eyes daily.     [name] escitalopram (LEXAPRO) 10 MG tablet TAKE ONE-HALF TABLET BY MOUTH ONCE DAILY 90 tablet 2  . famotidine (PEPCID) 20 MG tablet Take 1 tablet (20 mg total) by mouth 2 (two) times daily. 60 tablet 0  . folic acid-vitamin b complex-vitamin c-selenium-zinc (DIALYVITE) 3 MG TABS Take 1 tablet by mouth daily.      . promethazine (PHENERGAN) 25 MG tablet Take 12.5 mg by mouth every 6 (six) hours as needed for nausea or vomiting.    . quiNINE (QUALAQUIN) 324 MG capsule Take 648 mg by mouth 3 (three) times a week. Take on tuesdays thursdays and saturdays    . simvastatin (ZOCOR) 40 MG tablet Take 40 mg by mouth daily.    . cephALEXin (KEFLEX) 500 MG capsule Take 1 capsule (500 mg total) by mouth 2 (two) times daily. (Patient not taking: Reported on 02/18/2015) 10 capsule 0  . loperamide (IMODIUM) 2 MG capsule Take 1 capsule (2 mg total) by mouth as needed for diarrhea or loose stools. (Patient not taking: Reported on 02/18/2015) 30 capsule 0  . multivitamin (RENA-VIT) TABS tablet Take 1 tablet by mouth at bedtime. (Patient not taking:  Reported on 02/18/2015) 90 tablet 0    Past Medical History  Diagnosis Date  . Cough   . Diabetes mellitus     type 2  . GERD (gastroesophageal reflux disease)   . Hypertension   . Bronchitis   . Osteoarthritis   . Dyslipidemia     remote hx/notes 10/06/2009  . ESRD (end stage renal disease) on dialysis     Dr Coladonato/[facility]ell  . Depression     lexapro  . Staphylococcus aureus bacteremia with sepsis     thought from HD catheter    Past Surgical History  Procedure Laterality Date  . Eye surgery      Cataract extraction  . Thrombectomy and revision of arterioventous (av) goretex  graft Left 12/2004; 01/2006; 03/12/2009    forearmnotes 10/13/2010; forearmnotes 10/13/2010; upper arm/notes 03/19/2009  . Abdominal hysterectomy      remote hx/notes 10/06/2009  . Vitrectomy Left 03/2001    with membrane peel/notes 10/13/2010  . Arteriovenous graft placement Left 07/2004    forearm/notes 10/13/2010  . Arteriovenous graft placement Left 03/2006    upper armnotes 10/13/2010  . Peripheral vascular catheterization Right 02/18/2015    Procedure: A/V Shuntogram/Fistulagram;  Surgeon: Annice Needy, MD;  Location: [facility] INVASIVE CV LAB;  Service: Cardiovascular;  Laterality: Right;  . Peripheral vascular catheterization N/A 02/18/2015    Procedure: A/V Shunt Intervention;  Surgeon: Barbara Cower  Driscilla Grammes, MD;  Location: [facility] INVASIVE CV LAB;  Service: Cardiovascular;  Laterality: N/A;    Social History Social History  Substance Use Topics  . Smoking status: Never Smoker   . Smokeless tobacco: None  . Alcohol Use: No  No IVDU  Family History Family History  Problem Relation Age of Onset  . Hypertension Mother   no bleeding or clotting disorders, no autoimmune diseases  No Known Allergies   REVIEW OF SYSTEMS (Negative unless checked)  Constitutional: Weight loss  Fever  Chills Cardiac: Chest pain   Chest pressure   Palpitations   Shortness of breath when laying flat   Shortness of  breath at rest   Shortness of breath with exertion. Vascular:  Pain in legs with walking   Pain in legs at rest   Pain in legs when laying flat   Claudication   Pain in feet when walking  Pain in feet at rest  Pain in feet when laying flat   History of DVT   Phlebitis   Swelling in legs   Varicose veins   Non-healing ulcers Pulmonary:   Uses home oxygen   Productive cough   Hemoptysis   Wheeze  COPD   Asthma Neurologic:  Dizziness  Blackouts   Seizures   History of stroke   History of TIA  Aphasia   Temporary blindness   Dysphagia   Weakness or numbness in arms   Weakness or numbness in legs Musculoskeletal:  Arthritis   Joint swelling   Joint pain   Low back pain Hematologic:  Easy bruising  Easy bleeding   Hypercoagulable state   Anemic  Hepatitis Gastrointestinal:  Blood in stool   Vomiting blood  Gastroesophageal reflux/heartburn   Difficulty swallowing. Genitourinary:  Chronic kidney disease   Difficult urination  Frequent urination  Burning with urination   Blood in urine Skin:  Rashes   Ulcers   Wounds Psychological:  History of anxiety    History of major depression.  Physical Examination  Filed Vitals:   02/18/15 1315 02/18/15 1451 02/18/15 1500  BP:  132/59   Pulse: 71 77   Temp: 99.9 F (37.7 C)    TempSrc: Oral    Resp: 17 15   Height:  (1.575 m)    Weight: 60.782 kg (134 lb)    SpO2:  97% 97%   Body mass index is 24.5 kg/(m^2). Gen: WD/WN, NAD Head: [place]/AT, No temporalis wasting. Prominent temp pulse not noted. Ear/Nose/Throat: Hearing grossly intact, nares w/o erythema or drainage, oropharynx w/o Erythema/Exudate,  Eyes: PERRLA, EOMI.  Neck: Supple, no nuchal rigidity.  No bruit or JVD.  Pulmonary:  Good air movement, clear to auscultation bilaterally, no use of accessory muscles.  Cardiac: RRR, normal S1, S2, no Murmurs, rubs or gallops. Vascular: thrill and  bruit present right arm HeRO graft Vessel Right Left  Radial Palpable Palpable                                   Gastrointestinal: soft, non-tender/non-distended. No guarding/reflex.  Musculoskeletal: M/S 5/5 throughout.  Extremities without ischemic changes.  No deformity or atrophy.  Neurologic: CN 2-12 intact. Pain and light touch intact in extremities.  Symmetrical.  Speech is fluent. Motor exam as listed above. Psychiatric: Judgment intact, Mood & affect appropriate for pt's clinical situation. Dermatologic: No rashes or ulcers noted.  No cellulitis or open wounds. Lymph : No Cervical,  Axillary, or Inguinal lymphadenopathy.     CBC Lab Results  Component Value Date   WBC 5.0 02/01/2015   HGB 11.0* 02/01/2015   HCT 34.0* 02/01/2015   MCV 88.4 02/01/2015   PLT 124.0* 02/01/2015    BMET    Component Value Date/Time   NA 141 02/01/2015 1331   K 5.1 02/01/2015 1331   CL 96 02/01/2015 1331   CO2 34* 02/01/2015 1331   GLUCOSE 100* 02/01/2015 1331   GLUCOSE 111 02/07/2009   GLUCOSE 246* 05/28/2006 1229   BUN 26* 02/01/2015 1331   CREATININE 5.25* 02/01/2015 1331   CALCIUM 8.9 02/01/2015 1331   CALCIUM 9.6 07/18/2012 1051   GFRNONAA 4* 11/20/2014 0839   GFRAA 4* 11/20/2014 0839   Estimated Creatinine Clearance: 6.9 mL/min (by C-G formula based on Cr of 5.25).  COAG Lab Results  Component Value Date   INR 1.30 07/25/2013   INR 1.25 07/05/2010    Radiology No results found.    Assessment/Plan 1. ESRD. Using HeRO graft 2. Dysfunction of dialysis access.  Prolonged bleeding. For Landry Dyke, MD  02/22/2015 9:27 AM

## 2015-02-23 DIAGNOSIS — B9689 Other specified bacterial agents as the cause of diseases classified elsewhere: Secondary | ICD-10-CM | POA: Diagnosis not present

## 2015-02-23 DIAGNOSIS — D631 Anemia in chronic kidney disease: Secondary | ICD-10-CM | POA: Diagnosis not present

## 2015-02-23 DIAGNOSIS — D509 Iron deficiency anemia, unspecified: Secondary | ICD-10-CM | POA: Diagnosis not present

## 2015-02-23 DIAGNOSIS — N2581 Secondary hyperparathyroidism of renal origin: Secondary | ICD-10-CM | POA: Diagnosis not present

## 2015-02-23 DIAGNOSIS — R509 Fever, unspecified: Secondary | ICD-10-CM | POA: Diagnosis not present

## 2015-02-23 DIAGNOSIS — N186 End stage renal disease: Secondary | ICD-10-CM | POA: Diagnosis not present

## 2015-02-26 DIAGNOSIS — R509 Fever, unspecified: Secondary | ICD-10-CM | POA: Diagnosis not present

## 2015-02-26 DIAGNOSIS — N186 End stage renal disease: Secondary | ICD-10-CM | POA: Diagnosis not present

## 2015-02-26 DIAGNOSIS — D509 Iron deficiency anemia, unspecified: Secondary | ICD-10-CM | POA: Diagnosis not present

## 2015-02-26 DIAGNOSIS — N2581 Secondary hyperparathyroidism of renal origin: Secondary | ICD-10-CM | POA: Diagnosis not present

## 2015-02-26 DIAGNOSIS — D631 Anemia in chronic kidney disease: Secondary | ICD-10-CM | POA: Diagnosis not present

## 2015-02-26 DIAGNOSIS — B9689 Other specified bacterial agents as the cause of diseases classified elsewhere: Secondary | ICD-10-CM | POA: Diagnosis not present

## 2015-02-27 DIAGNOSIS — T82318A Breakdown (mechanical) of other vascular grafts, initial encounter: Secondary | ICD-10-CM | POA: Diagnosis not present

## 2015-02-27 DIAGNOSIS — T82858A Stenosis of vascular prosthetic devices, implants and grafts, initial encounter: Secondary | ICD-10-CM | POA: Diagnosis not present

## 2015-02-27 DIAGNOSIS — Z992 Dependence on renal dialysis: Secondary | ICD-10-CM | POA: Diagnosis not present

## 2015-02-27 DIAGNOSIS — Y841 Kidney dialysis as the cause of abnormal reaction of the patient, or of later complication, without mention of misadventure at the time of the procedure: Secondary | ICD-10-CM | POA: Diagnosis not present

## 2015-02-27 DIAGNOSIS — N186 End stage renal disease: Secondary | ICD-10-CM | POA: Diagnosis not present

## 2015-02-28 DIAGNOSIS — T82858A Stenosis of vascular prosthetic devices, implants and grafts, initial encounter: Secondary | ICD-10-CM | POA: Diagnosis not present

## 2015-02-28 DIAGNOSIS — N2581 Secondary hyperparathyroidism of renal origin: Secondary | ICD-10-CM | POA: Diagnosis not present

## 2015-02-28 DIAGNOSIS — E785 Hyperlipidemia, unspecified: Secondary | ICD-10-CM | POA: Diagnosis not present

## 2015-02-28 DIAGNOSIS — Y841 Kidney dialysis as the cause of abnormal reaction of the patient, or of later complication, without mention of misadventure at the time of the procedure: Secondary | ICD-10-CM | POA: Diagnosis not present

## 2015-02-28 DIAGNOSIS — B9689 Other specified bacterial agents as the cause of diseases classified elsewhere: Secondary | ICD-10-CM | POA: Diagnosis not present

## 2015-02-28 DIAGNOSIS — D509 Iron deficiency anemia, unspecified: Secondary | ICD-10-CM | POA: Diagnosis not present

## 2015-02-28 DIAGNOSIS — T82318A Breakdown (mechanical) of other vascular grafts, initial encounter: Secondary | ICD-10-CM | POA: Diagnosis not present

## 2015-02-28 DIAGNOSIS — N186 End stage renal disease: Secondary | ICD-10-CM | POA: Diagnosis not present

## 2015-02-28 DIAGNOSIS — D631 Anemia in chronic kidney disease: Secondary | ICD-10-CM | POA: Diagnosis not present

## 2015-02-28 DIAGNOSIS — R509 Fever, unspecified: Secondary | ICD-10-CM | POA: Diagnosis not present

## 2015-02-28 DIAGNOSIS — I10 Essential (primary) hypertension: Secondary | ICD-10-CM | POA: Diagnosis not present

## 2015-02-28 DIAGNOSIS — Z992 Dependence on renal dialysis: Secondary | ICD-10-CM | POA: Diagnosis not present

## 2015-03-01 DIAGNOSIS — Z992 Dependence on renal dialysis: Secondary | ICD-10-CM | POA: Diagnosis not present

## 2015-03-01 DIAGNOSIS — E1129 Type 2 diabetes mellitus with other diabetic kidney complication: Secondary | ICD-10-CM | POA: Diagnosis not present

## 2015-03-01 DIAGNOSIS — N186 End stage renal disease: Secondary | ICD-10-CM | POA: Diagnosis not present

## 2015-03-02 DIAGNOSIS — R509 Fever, unspecified: Secondary | ICD-10-CM | POA: Diagnosis not present

## 2015-03-02 DIAGNOSIS — D631 Anemia in chronic kidney disease: Secondary | ICD-10-CM | POA: Diagnosis not present

## 2015-03-02 DIAGNOSIS — N186 End stage renal disease: Secondary | ICD-10-CM | POA: Diagnosis not present

## 2015-03-02 DIAGNOSIS — T827XXA Infection and inflammatory reaction due to other cardiac and vascular devices, implants and grafts, initial encounter: Secondary | ICD-10-CM | POA: Diagnosis not present

## 2015-03-02 DIAGNOSIS — D689 Coagulation defect, unspecified: Secondary | ICD-10-CM | POA: Diagnosis not present

## 2015-03-02 DIAGNOSIS — D509 Iron deficiency anemia, unspecified: Secondary | ICD-10-CM | POA: Diagnosis not present

## 2015-03-02 DIAGNOSIS — A047 Enterocolitis due to Clostridium difficile: Secondary | ICD-10-CM | POA: Diagnosis not present

## 2015-03-04 ENCOUNTER — Encounter
Admission: RE | Admit: 2015-03-04 | Discharge: 2015-03-04 | Disposition: A | Discharge status: Home / Self Care | Payer: Medicare Other | Source: Ambulatory Visit | Attending: Vascular Surgery | Admitting: Vascular Surgery

## 2015-03-04 DIAGNOSIS — I120 Hypertensive chronic kidney disease with stage 5 chronic kidney disease or end stage renal disease: Secondary | ICD-10-CM | POA: Diagnosis not present

## 2015-03-04 DIAGNOSIS — Z79899 Other long term (current) drug therapy: Secondary | ICD-10-CM | POA: Diagnosis not present

## 2015-03-04 DIAGNOSIS — N186 End stage renal disease: Secondary | ICD-10-CM | POA: Diagnosis not present

## 2015-03-04 DIAGNOSIS — K219 Gastro-esophageal reflux disease without esophagitis: Secondary | ICD-10-CM | POA: Diagnosis not present

## 2015-03-04 DIAGNOSIS — F418 Other specified anxiety disorders: Secondary | ICD-10-CM | POA: Diagnosis not present

## 2015-03-04 DIAGNOSIS — E1122 Type 2 diabetes mellitus with diabetic chronic kidney disease: Secondary | ICD-10-CM | POA: Diagnosis not present

## 2015-03-04 DIAGNOSIS — T82838A Hemorrhage of vascular prosthetic devices, implants and grafts, initial encounter: Secondary | ICD-10-CM | POA: Diagnosis not present

## 2015-03-04 DIAGNOSIS — E785 Hyperlipidemia, unspecified: Secondary | ICD-10-CM | POA: Diagnosis not present

## 2015-03-04 DIAGNOSIS — Z992 Dependence on renal dialysis: Secondary | ICD-10-CM | POA: Diagnosis not present

## 2015-03-04 DIAGNOSIS — Y832 Surgical operation with anastomosis, bypass or graft as the cause of abnormal reaction of the patient, or of later complication, without mention of misadventure at the time of the procedure: Secondary | ICD-10-CM | POA: Diagnosis not present

## 2015-03-04 DIAGNOSIS — Z7982 Long term (current) use of aspirin: Secondary | ICD-10-CM | POA: Diagnosis not present

## 2015-03-04 HISTORY — DX: Anxiety disorder, unspecified: F41.9

## 2015-03-04 LAB — BASIC METABOLIC PANEL
ANION GAP: 13 (ref 5–15)
BUN: 32 mg/dL — ABNORMAL HIGH (ref 6–20)
CALCIUM: 9 mg/dL (ref 8.9–10.3)
CO2: 30 mmol/L (ref 22–32)
Chloride: 95 mmol/L — ABNORMAL LOW (ref 101–111)
Creatinine, Ser: 6.68 mg/dL — ABNORMAL HIGH (ref 0.44–1.00)
GFR calc Af Amer: 6 mL/min — ABNORMAL LOW (ref >60)
GFR, EST NON AFRICAN AMERICAN: 5 mL/min — AB (ref >60)
Glucose, Bld: 144 mg/dL — ABNORMAL HIGH (ref 65–99)
POTASSIUM: 4.7 mmol/L (ref 3.5–5.1)
SODIUM: 138 mmol/L (ref 135–145)

## 2015-03-04 LAB — CBC
HEMATOCRIT: 31.1 % — AB (ref 35.0–47.0)
HEMOGLOBIN: 10.1 g/dL — AB (ref 12.0–16.0)
MCH: 29.3 pg (ref 26.0–34.0)
MCHC: 32.5 g/dL (ref 32.0–36.0)
MCV: 90.2 fL (ref 80.0–100.0)
Platelets: 129 10*3/uL — ABNORMAL LOW (ref 150–440)
RBC: 3.45 MIL/uL — ABNORMAL LOW (ref 3.80–5.20)
RDW: 21.1 % — AB (ref 11.5–14.5)
WBC: 5.7 10*3/uL (ref 3.6–11.0)

## 2015-03-04 LAB — TYPE AND SCREEN
ABO/RH(D): A POS
ANTIBODY SCREEN: NEGATIVE

## 2015-03-04 LAB — ABO/RH: ABO/RH(D): A POS

## 2015-03-04 NOTE — Patient Instructions (Signed)
  Your procedure is scheduled on: Friday 03/08/15 Report to Day Surgery. 2ND FLOOR MEDICAL MALL ENTRANCE To find out your arrival time please call [phone] between 1PM - 3PM on Thursday 03/07/15.  Remember: Instructions that are not followed completely may result in serious medical risk, up to and including death, or upon the discretion of your surgeon and anesthesiologist your surgery may need to be rescheduled.    __X__ 1. Do not eat food or drink liquids after midnight. No gum chewing or hard candies.     __X__ 2. No Alcohol for 24 hours before or after surgery.   ____ 3. Bring all medications with you on the day of surgery if instructed.    __X__ 4. Notify your doctor if there is any change in your medical condition     (cold, fever, infections).     Do not wear jewelry, make-up, hairpins, clips or nail polish.  Do not wear lotions, powders, or perfumes.   Do not shave 48 hours prior to surgery. Men may shave face and neck.  Do not bring valuables to the hospital.    [facility] is not responsible for any belongings or valuables.               Contacts, dentures or bridgework may not be worn into surgery.  Leave your suitcase in the car. After surgery it may be brought to your room.  For patients admitted to the hospital, discharge time is determined by your                treatment team.   Patients discharged the day of surgery will not be allowed to drive home.   Please read over the following fact sheets that you were given:   Surgical Site Infection Prevention   __X__ Take these medicines the morning of surgery with A SIP OF WATER:    1. AMLODIPINE  2. LEXAPRO  3. PEPCID  4. ZOCOR  5.   6.  ____ Fleet Enema (as directed)   __X__ Use CHG Soap as directed  ____ Use inhalers on the day of surgery  ____ Stop metformin 2 days prior to surgery    ____ Take 1/2 of usual insulin dose the night before surgery and none on the morning of surgery.   ____ Stop  Coumadin/Plavix/aspirin on   ____ Stop Anti-inflammatories on    ____ Stop supplements until after surgery.    ____ Bring C-Pap to the hospital.

## 2015-03-05 DIAGNOSIS — A047 Enterocolitis due to Clostridium difficile: Secondary | ICD-10-CM | POA: Diagnosis not present

## 2015-03-05 DIAGNOSIS — T82858A Stenosis of vascular prosthetic devices, implants and grafts, initial encounter: Secondary | ICD-10-CM | POA: Diagnosis not present

## 2015-03-05 DIAGNOSIS — T827XXA Infection and inflammatory reaction due to other cardiac and vascular devices, implants and grafts, initial encounter: Secondary | ICD-10-CM | POA: Diagnosis not present

## 2015-03-05 DIAGNOSIS — E785 Hyperlipidemia, unspecified: Secondary | ICD-10-CM | POA: Diagnosis not present

## 2015-03-05 DIAGNOSIS — T82318A Breakdown (mechanical) of other vascular grafts, initial encounter: Secondary | ICD-10-CM | POA: Diagnosis not present

## 2015-03-05 DIAGNOSIS — Y841 Kidney dialysis as the cause of abnormal reaction of the patient, or of later complication, without mention of misadventure at the time of the procedure: Secondary | ICD-10-CM | POA: Diagnosis not present

## 2015-03-05 DIAGNOSIS — Z992 Dependence on renal dialysis: Secondary | ICD-10-CM | POA: Diagnosis not present

## 2015-03-05 DIAGNOSIS — N186 End stage renal disease: Secondary | ICD-10-CM | POA: Diagnosis not present

## 2015-03-05 DIAGNOSIS — D689 Coagulation defect, unspecified: Secondary | ICD-10-CM | POA: Diagnosis not present

## 2015-03-05 DIAGNOSIS — I10 Essential (primary) hypertension: Secondary | ICD-10-CM | POA: Diagnosis not present

## 2015-03-05 DIAGNOSIS — D509 Iron deficiency anemia, unspecified: Secondary | ICD-10-CM | POA: Diagnosis not present

## 2015-03-05 DIAGNOSIS — D631 Anemia in chronic kidney disease: Secondary | ICD-10-CM | POA: Diagnosis not present

## 2015-03-06 ENCOUNTER — Other Ambulatory Visit: Payer: Self-pay | Admitting: Vascular Surgery

## 2015-03-06 ENCOUNTER — Encounter
Admission: AD | Disposition: A | Discharge status: Home / Self Care | Payer: Self-pay | Source: Ambulatory Visit | Attending: Vascular Surgery

## 2015-03-06 ENCOUNTER — Ambulatory Visit
Admission: AD | Admit: 2015-03-06 | Discharge: 2015-03-06 | Disposition: A | Discharge status: Home / Self Care | Payer: Medicare Other | Source: Ambulatory Visit | Attending: Vascular Surgery | Admitting: Vascular Surgery

## 2015-03-06 ENCOUNTER — Encounter: Payer: Self-pay | Admitting: *Deleted

## 2015-03-06 DIAGNOSIS — N186 End stage renal disease: Secondary | ICD-10-CM | POA: Insufficient documentation

## 2015-03-06 DIAGNOSIS — E785 Hyperlipidemia, unspecified: Secondary | ICD-10-CM | POA: Insufficient documentation

## 2015-03-06 DIAGNOSIS — K219 Gastro-esophageal reflux disease without esophagitis: Secondary | ICD-10-CM | POA: Insufficient documentation

## 2015-03-06 DIAGNOSIS — E1122 Type 2 diabetes mellitus with diabetic chronic kidney disease: Secondary | ICD-10-CM | POA: Diagnosis not present

## 2015-03-06 DIAGNOSIS — Z79899 Other long term (current) drug therapy: Secondary | ICD-10-CM | POA: Insufficient documentation

## 2015-03-06 DIAGNOSIS — Y832 Surgical operation with anastomosis, bypass or graft as the cause of abnormal reaction of the patient, or of later complication, without mention of misadventure at the time of the procedure: Secondary | ICD-10-CM | POA: Insufficient documentation

## 2015-03-06 DIAGNOSIS — Z992 Dependence on renal dialysis: Secondary | ICD-10-CM | POA: Diagnosis not present

## 2015-03-06 DIAGNOSIS — F418 Other specified anxiety disorders: Secondary | ICD-10-CM | POA: Insufficient documentation

## 2015-03-06 DIAGNOSIS — Z7982 Long term (current) use of aspirin: Secondary | ICD-10-CM | POA: Insufficient documentation

## 2015-03-06 DIAGNOSIS — T82838A Hemorrhage of vascular prosthetic devices, implants and grafts, initial encounter: Secondary | ICD-10-CM | POA: Insufficient documentation

## 2015-03-06 DIAGNOSIS — I120 Hypertensive chronic kidney disease with stage 5 chronic kidney disease or end stage renal disease: Secondary | ICD-10-CM | POA: Insufficient documentation

## 2015-03-06 DIAGNOSIS — T82858A Stenosis of vascular prosthetic devices, implants and grafts, initial encounter: Secondary | ICD-10-CM | POA: Diagnosis not present

## 2015-03-06 HISTORY — PX: PERIPHERAL VASCULAR CATHETERIZATION: SHX172C

## 2015-03-06 LAB — GLUCOSE, CAPILLARY
GLUCOSE-CAPILLARY: 106 mg/dL — AB (ref 65–99)
Glucose-Capillary: 107 mg/dL — ABNORMAL HIGH (ref 65–99)

## 2015-03-06 SURGERY — DIALYSIS/PERMA CATHETER INSERTION
Anesthesia: Moderate Sedation

## 2015-03-06 MED ORDER — CHLORHEXIDINE GLUCONATE CLOTH 2 % EX PADS: 6.0000 | MEDICATED_PAD | Freq: Once | CUTANEOUS | Status: DC

## 2015-03-06 MED ORDER — DEXTROSE 5 % IV SOLN
INTRAVENOUS | Status: AC
  Filled 2015-03-06: qty 1.5

## 2015-03-06 MED ORDER — MIDAZOLAM HCL 2 MG/2ML IJ SOLN
INTRAMUSCULAR | Status: AC
  Filled 2015-03-06: qty 2

## 2015-03-06 MED ORDER — FENTANYL CITRATE (PF) 100 MCG/2ML IJ SOLN
INTRAMUSCULAR | Status: AC
  Filled 2015-03-06: qty 2

## 2015-03-06 MED ORDER — FENTANYL CITRATE (PF) 100 MCG/2ML IJ SOLN
INTRAMUSCULAR | Status: DC | PRN
  Administered 2015-03-06: 50 ug via INTRAVENOUS

## 2015-03-06 MED ORDER — MIDAZOLAM HCL 2 MG/2ML IJ SOLN
INTRAMUSCULAR | Status: DC | PRN
  Administered 2015-03-06: 2 mg via INTRAVENOUS

## 2015-03-06 MED ORDER — HEPARIN SODIUM (PORCINE) 10000 UNIT/ML IJ SOLN
INTRAMUSCULAR | Status: AC
  Filled 2015-03-06: qty 1

## 2015-03-06 MED ORDER — LIDOCAINE-EPINEPHRINE (PF) 1 %-1:200000 IJ SOLN
INTRAMUSCULAR | Status: AC
  Filled 2015-03-06: qty 30

## 2015-03-06 MED ORDER — INSULIN ASPART 100 UNIT/ML ~~LOC~~ SOLN: 0.0000 [IU] | SUBCUTANEOUS | Status: DC

## 2015-03-06 MED ORDER — SODIUM CHLORIDE 0.9 % IV SOLN
INTRAVENOUS | Status: DC
  Administered 2015-03-06: 13:00:00 via INTRAVENOUS

## 2015-03-06 MED ORDER — HEPARIN (PORCINE) IN NACL 2-0.9 UNIT/ML-% IJ SOLN
INTRAMUSCULAR | Status: AC
  Filled 2015-03-06: qty 500

## 2015-03-06 SURGICAL SUPPLY — 7 items
CATH PALINDROME-P 44CM KIT (CATHETERS) ×2
KIT CATH CHRNC PALINDROME PRCS (CATHETERS) ×1 IMPLANT
PACK ANGIOGRAPHY (CUSTOM PROCEDURE TRAY) ×3 IMPLANT
SUT MNCRL 4-0 (SUTURE) ×2
SUT MNCRL 4-0 27XMFL (SUTURE) ×1
SUT PROLENE 0 CT 1 30 (SUTURE) ×3 IMPLANT
SUTURE MNCRL 4-0 27XMF (SUTURE) ×1 IMPLANT

## 2015-03-06 NOTE — Op Note (Signed)
OPERATIVE NOTE    PRE-OPERATIVE DIAGNOSIS: 1. ESRD 2. Bleeding from right arm HeRO graft requiring surgical revision  POST-OPERATIVE DIAGNOSIS: same as above  PROCEDURE: 1. Ultrasound guidance for vascular access to the right femoral  vein 2. Fluoroscopic guidance for placement of catheter 3. Placement of a 44 cm tip to cuff tunneled hemodialysis catheter via the right femoral vein  SURGEON: [name],[name], MD  ANESTHESIA:  Local/MCS  ESTIMATED BLOOD LOSS: minimal  FINDING(S): 1.  Patent right femoral vein   SPECIMEN(S):  None  INDICATIONS:   Patient is a 81 y.o.female who presents with ESRD and bleeding from her HeRO which needs surgically revised.   The patient needs long term dialysis access for their ESRD, and a Permcath is necessary.  Risks and benefits are discussed and informed consent is obtained.    DESCRIPTION: After obtaining full informed written consent, the patient was brought back to the vascular suited. The patient's right groin was sterilely prepped and draped and a sterile surgical field was created.  The right femoral vein was visualized with ultrasound and found to be patent. It was then accessed under direct ultrasound guidance and a permanent image was recorded. A wire was placed. After skin nick and dilatation, the peel-away sheath was placed over the wire. I then turned my attention to an area about 4-5 cm inferior and lateral to the access incision and a small counterincision was created.  I tunneled from the counter  incision to the access site. Using fluoroscopic guidance, a 44 cm centimeterer tip to cuff tunneled hemodialysis catheter was selected, and tunneled from the counter  incision to the access site. It was then placed through the peel-away sheath and the peel-away sheath was removed. Using fluoroscopic guidance the catheter tips were parked in the right atrium. The appropriate distal connectors were placed. It withdrew blood well and flushed easily with  heparinized saline and a concentrated heparin solution was then placed. It was secured to the leg with 2 Prolene sutures. The access incision was closed single 4-0 Monocryl. A 4-0 Monocryl pursestring suture was placed around the exit site. Sterile dressings were placed. The patient tolerated the procedure well and was taken to the recovery room in stable condition.  COMPLICATIONS: None  CONDITION: Stable    [name],[name] 03/06/2015 2:49 PM

## 2015-03-06 NOTE — Discharge Instructions (Signed)
, Care After °Refer to this sheet in the next few weeks. These instructions provide you with information on caring for yourself after your procedure. Your caregiver may also give you more specific instructions. Your treatment has been planned according to current medical practices, but problems sometimes occur. Call your caregiver if you have any problems or questions after your procedure.  °HOME CARE INSTRUCTIONS °· Rest at home the day of the procedure. You will likely be able to return to normal activities the following day. °· Follow your caregiver's specific instructions for the type of device that you have. °· Only take over-the-counter or prescription medicines as directed by your caregiver. °· Keep the insertion site of the catheter clean and dry at all times. °¨ Change the bandages (dressings) over the catheter site as directed by your caregiver. °¨ Wash the area around the catheter site during each dressing change. Sponge bathe the area using a germ-killing (antiseptic) solution as directed by your caregiver. °¨ Look for redness or swelling at the insertion site during each dressing change. °· Apply an antibiotic ointment as directed by your caregiver. °· Flush your catheter as directed to keep it from becoming clogged. °· Always wash your hands thoroughly before changing dressings or flushing the catheter. °· Do not let air enter the catheter. °¨ Never open the cap at the catheter tip. °¨ Always make sure there is no air in the syringe or in the tubing for infusions.    °· Do not lift anything heavy. °· Do not drive until your caregiver approves. °· Do not shower or bathe until your caregiver approves. When you shower or bathe, place a piece of plastic wrap over the catheter site. Do not allow the catheter site or the dressing to get wet. If taking a bath, do not allow the catheter to get submerged in the water. °If the catheter was inserted through an arm vein:  °· Avoid wearing tight clothes or jewelry  on the arm that has the catheter.   °· Do not sleep with your head on the arm that has the catheter.   °· Do not allow use of a blood pressure cuff on the arm that has the catheter.   °· Do not let anyone draw blood from the arm that has the catheter, except through the catheter itself. °SEEK MEDICAL CARE IF: °· You have bleeding at the insertion site of the catheter.   °· You feel weak or nauseous.   °· Your catheter is not working properly.   °· You have redness, pain, swelling, and warmth at the insertion site.   °· You notice fluid draining from the insertion site.   °SEEK IMMEDIATE MEDICAL CARE IF: °· Your catheter breaks or has a hole in it.   °· Your catheter comes loose or gets pulled completely out. If this happens, hold firm pressure over the area with your hand or a clean cloth.   °· You have a fever. °· You have chills.   °· Your catheter becomes totally blocked.   °· You have swelling in your arm, shoulder, neck, or face.   °· You have bleeding from the insertion site that does not stop.   °· You develop chest pain or have trouble breathing.   °· You feel dizzy or faint.   °MAKE SURE YOU: °· Understand these instructions. °· Will watch your condition. °· Will get help right away if you are not doing well or get worse. °  °This information is not intended to replace advice given to you by your health care provider. Make sure you discuss any questions you   have with your health care provider. °  °Document Released: 05/04/2012 Document Revised: 01/18/2013 Document Reviewed: 05/04/2012 °Elsevier Interactive Patient Education ©2016 [employer] ° °

## 2015-03-06 NOTE — H&P (Signed)
  [facility] [facility] History & Physical Update  The patient was interviewed and re-examined.  The patient's previous History and Physical has been reviewed and is unchanged.  There is no change in the plan of care. We plan to proceed with the scheduled procedure.  Nydia Ytuarte, MD  03/06/2015, 2:37 PM

## 2015-03-07 ENCOUNTER — Encounter: Payer: Self-pay | Admitting: Vascular Surgery

## 2015-03-07 DIAGNOSIS — T827XXA Infection and inflammatory reaction due to other cardiac and vascular devices, implants and grafts, initial encounter: Secondary | ICD-10-CM | POA: Diagnosis not present

## 2015-03-07 DIAGNOSIS — A047 Enterocolitis due to Clostridium difficile: Secondary | ICD-10-CM | POA: Diagnosis not present

## 2015-03-07 DIAGNOSIS — N186 End stage renal disease: Secondary | ICD-10-CM | POA: Diagnosis not present

## 2015-03-07 DIAGNOSIS — D631 Anemia in chronic kidney disease: Secondary | ICD-10-CM | POA: Diagnosis not present

## 2015-03-07 DIAGNOSIS — D689 Coagulation defect, unspecified: Secondary | ICD-10-CM | POA: Diagnosis not present

## 2015-03-07 DIAGNOSIS — D509 Iron deficiency anemia, unspecified: Secondary | ICD-10-CM | POA: Diagnosis not present

## 2015-03-08 ENCOUNTER — Ambulatory Visit
Admission: RE | Admit: 2015-03-08 | Discharge: 2015-03-08 | Disposition: A | Discharge status: Home / Self Care | Payer: Medicare Other | Source: Ambulatory Visit | Attending: Vascular Surgery | Admitting: Vascular Surgery

## 2015-03-08 ENCOUNTER — Encounter
Admission: RE | Disposition: A | Discharge status: Home / Self Care | Payer: Self-pay | Source: Ambulatory Visit | Attending: Vascular Surgery

## 2015-03-08 ENCOUNTER — Ambulatory Visit: Payer: Medicare Other | Admitting: *Deleted

## 2015-03-08 ENCOUNTER — Encounter: Payer: Self-pay | Admitting: *Deleted

## 2015-03-08 DIAGNOSIS — Y841 Kidney dialysis as the cause of abnormal reaction of the patient, or of later complication, without mention of misadventure at the time of the procedure: Secondary | ICD-10-CM | POA: Insufficient documentation

## 2015-03-08 DIAGNOSIS — E785 Hyperlipidemia, unspecified: Secondary | ICD-10-CM | POA: Insufficient documentation

## 2015-03-08 DIAGNOSIS — I120 Hypertensive chronic kidney disease with stage 5 chronic kidney disease or end stage renal disease: Secondary | ICD-10-CM | POA: Insufficient documentation

## 2015-03-08 DIAGNOSIS — N186 End stage renal disease: Secondary | ICD-10-CM | POA: Diagnosis not present

## 2015-03-08 DIAGNOSIS — Z79899 Other long term (current) drug therapy: Secondary | ICD-10-CM | POA: Diagnosis not present

## 2015-03-08 DIAGNOSIS — Z992 Dependence on renal dialysis: Secondary | ICD-10-CM | POA: Diagnosis not present

## 2015-03-08 DIAGNOSIS — I10 Essential (primary) hypertension: Secondary | ICD-10-CM | POA: Diagnosis not present

## 2015-03-08 DIAGNOSIS — E1122 Type 2 diabetes mellitus with diabetic chronic kidney disease: Secondary | ICD-10-CM | POA: Diagnosis not present

## 2015-03-08 DIAGNOSIS — D649 Anemia, unspecified: Secondary | ICD-10-CM | POA: Diagnosis not present

## 2015-03-08 DIAGNOSIS — K219 Gastro-esophageal reflux disease without esophagitis: Secondary | ICD-10-CM | POA: Diagnosis not present

## 2015-03-08 DIAGNOSIS — Z7982 Long term (current) use of aspirin: Secondary | ICD-10-CM | POA: Insufficient documentation

## 2015-03-08 DIAGNOSIS — T82898A Other specified complication of vascular prosthetic devices, implants and grafts, initial encounter: Secondary | ICD-10-CM | POA: Diagnosis not present

## 2015-03-08 DIAGNOSIS — T82858A Stenosis of vascular prosthetic devices, implants and grafts, initial encounter: Secondary | ICD-10-CM | POA: Diagnosis not present

## 2015-03-08 DIAGNOSIS — F418 Other specified anxiety disorders: Secondary | ICD-10-CM | POA: Diagnosis not present

## 2015-03-08 HISTORY — PX: REVISION OF ARTERIOVENOUS GORETEX GRAFT: SHX6073

## 2015-03-08 LAB — POCT I-STAT 4, (NA,K, GLUC, HGB,HCT)
Glucose, Bld: 98 mg/dL (ref 65–99)
HCT: 28 % — ABNORMAL LOW (ref 36.0–46.0)
HEMOGLOBIN: 9.5 g/dL — AB (ref 12.0–15.0)
Potassium: 4.4 mmol/L (ref 3.5–5.1)
Sodium: 138 mmol/L (ref 135–145)

## 2015-03-08 LAB — GLUCOSE, CAPILLARY
GLUCOSE-CAPILLARY: 98 mg/dL (ref 65–99)
Glucose-Capillary: 93 mg/dL (ref 65–99)

## 2015-03-08 LAB — POTASSIUM: POTASSIUM: 4.3 mmol/L (ref 3.5–5.1)

## 2015-03-08 SURGERY — REVISION OF ARTERIOVENOUS GORETEX GRAFT
Anesthesia: General | Laterality: Right | Wound class: Clean

## 2015-03-08 MED ORDER — BUPIVACAINE HCL (PF) 0.5 % IJ SOLN
INTRAMUSCULAR | Status: AC
  Filled 2015-03-08: qty 30

## 2015-03-08 MED ORDER — FAMOTIDINE 20 MG PO TABS
20.0000 mg | ORAL_TABLET | Freq: Once | ORAL | Status: AC
  Administered 2015-03-08: 20 mg via ORAL

## 2015-03-08 MED ORDER — ROCURONIUM BROMIDE 100 MG/10ML IV SOLN
INTRAVENOUS | Status: DC | PRN
  Administered 2015-03-08: 15 mg via INTRAVENOUS

## 2015-03-08 MED ORDER — SODIUM CHLORIDE 0.9 % IJ SOLN
INTRAMUSCULAR | Status: AC
  Filled 2015-03-08: qty 30

## 2015-03-08 MED ORDER — EPHEDRINE SULFATE 50 MG/ML IJ SOLN
INTRAMUSCULAR | Status: DC | PRN
  Administered 2015-03-08 (×2): 10 mg via INTRAVENOUS
  Administered 2015-03-08: 5 mg via INTRAVENOUS

## 2015-03-08 MED ORDER — LIDOCAINE HCL (CARDIAC) 20 MG/ML IV SOLN
INTRAVENOUS | Status: DC | PRN
  Administered 2015-03-08: 100 mg via INTRAVENOUS

## 2015-03-08 MED ORDER — HYDROCODONE-ACETAMINOPHEN 5-325 MG PO TABS: 1.0000 | ORAL_TABLET | Freq: Four times a day (QID) | ORAL | Status: DC | PRN

## 2015-03-08 MED ORDER — ONDANSETRON HCL 4 MG/2ML IJ SOLN
INTRAMUSCULAR | Status: DC | PRN
  Administered 2015-03-08: 4 mg via INTRAVENOUS

## 2015-03-08 MED ORDER — SODIUM CHLORIDE 0.9 % IV SOLN
INTRAVENOUS | Status: DC | PRN
  Administered 2015-03-08: 350 mL via INTRAMUSCULAR

## 2015-03-08 MED ORDER — FENTANYL CITRATE (PF) 100 MCG/2ML IJ SOLN
INTRAMUSCULAR | Status: DC | PRN
  Administered 2015-03-08: 25 ug via INTRAVENOUS
  Administered 2015-03-08: 100 ug via INTRAVENOUS
  Administered 2015-03-08: 25 ug via INTRAVENOUS

## 2015-03-08 MED ORDER — FAMOTIDINE 20 MG PO TABS
ORAL_TABLET | ORAL | Status: AC
  Filled 2015-03-08: qty 1

## 2015-03-08 MED ORDER — PROPOFOL 10 MG/ML IV BOLUS
INTRAVENOUS | Status: DC | PRN
  Administered 2015-03-08: 100 mg via INTRAVENOUS

## 2015-03-08 MED ORDER — GLYCOPYRROLATE 0.2 MG/ML IJ SOLN
INTRAMUSCULAR | Status: DC | PRN
  Administered 2015-03-08: 0.4 mg via INTRAVENOUS

## 2015-03-08 MED ORDER — CEFAZOLIN SODIUM 1-5 GM-% IV SOLN
1.0000 g | Freq: Once | INTRAVENOUS | Status: AC
  Administered 2015-03-08: 2 g via INTRAVENOUS

## 2015-03-08 MED ORDER — SUCCINYLCHOLINE CHLORIDE 20 MG/ML IJ SOLN
INTRAMUSCULAR | Status: DC | PRN
  Administered 2015-03-08: 100 mg via INTRAVENOUS

## 2015-03-08 MED ORDER — SODIUM CHLORIDE 0.9 % IJ SOLN
INTRAMUSCULAR | Status: AC
  Filled 2015-03-08: qty 50

## 2015-03-08 MED ORDER — CEFAZOLIN SODIUM-DEXTROSE 2-3 GM-% IV SOLR
INTRAVENOUS | Status: AC
  Filled 2015-03-08: qty 50

## 2015-03-08 MED ORDER — HEPARIN SODIUM (PORCINE) 1000 UNIT/ML IJ SOLN
INTRAMUSCULAR | Status: AC
  Filled 2015-03-08: qty 1

## 2015-03-08 MED ORDER — FENTANYL CITRATE (PF) 100 MCG/2ML IJ SOLN: 25.0000 ug | INTRAMUSCULAR | Status: DC | PRN

## 2015-03-08 MED ORDER — NEOSTIGMINE METHYLSULFATE 10 MG/10ML IV SOLN
INTRAVENOUS | Status: DC | PRN
  Administered 2015-03-08: 3 mg via INTRAVENOUS

## 2015-03-08 MED ORDER — BACITRACIN ZINC 500 UNIT/GM EX OINT
TOPICAL_OINTMENT | CUTANEOUS | Status: AC
  Filled 2015-03-08: qty 0.9

## 2015-03-08 MED ORDER — SODIUM CHLORIDE 0.9 % IV SOLN
INTRAVENOUS | Status: DC
  Administered 2015-03-08: 13:00:00 via INTRAVENOUS

## 2015-03-08 MED ORDER — ONDANSETRON HCL 4 MG/2ML IJ SOLN: 4.0000 mg | Freq: Once | INTRAMUSCULAR | Status: DC | PRN

## 2015-03-08 MED ORDER — HEPARIN SODIUM (PORCINE) 5000 UNIT/ML IJ SOLN
INTRAMUSCULAR | Status: AC
  Filled 2015-03-08: qty 1

## 2015-03-08 SURGICAL SUPPLY — 58 items
APPLIER CLIP 11 MED OPEN (CLIP)
APPLIER CLIP 9.375 SM OPEN (CLIP)
BAG DECANTER FOR FLEXI CONT (MISCELLANEOUS) ×3 IMPLANT
BLADE SURG 15 STRL LF DISP TIS (BLADE) ×1 IMPLANT
BLADE SURG 15 STRL SS (BLADE) ×2
BLADE SURG SZ11 CARB STEEL (BLADE) ×3 IMPLANT
BOOT SUTURE AID YELLOW STND (SUTURE) ×3 IMPLANT
BRUSH SCRUB 4% CHG (MISCELLANEOUS) ×3 IMPLANT
CANISTER SUCT 1200ML W/VALVE (MISCELLANEOUS) ×3 IMPLANT
CHLORAPREP W/TINT 26ML (MISCELLANEOUS) ×3 IMPLANT
CLIP APPLIE 11 MED OPEN (CLIP) IMPLANT
CLIP APPLIE 9.375 SM OPEN (CLIP) IMPLANT
DECANTER SPIKE VIAL GLASS SM (MISCELLANEOUS) ×3 IMPLANT
DRESSING SURGICEL FIBRLLR 1X2 (HEMOSTASIS) ×1 IMPLANT
DRSG SURGICEL FIBRILLAR 1X2 (HEMOSTASIS) ×3
DRSG TEGADERM 2-3/8X2-3/4 SM (GAUZE/BANDAGES/DRESSINGS) ×3 IMPLANT
ELECT CAUTERY BLADE 6.4 (BLADE) ×3 IMPLANT
GEL ULTRASOUND 20GR AQUASONIC (MISCELLANEOUS) IMPLANT
GLOVE SURG SYN 8.0 (GLOVE) ×30 IMPLANT
GOWN STRL REUS W/ TWL LRG LVL3 (GOWN DISPOSABLE) ×3 IMPLANT
GOWN STRL REUS W/ TWL XL LVL3 (GOWN DISPOSABLE) ×2 IMPLANT
GOWN STRL REUS W/TWL LRG LVL3 (GOWN DISPOSABLE) ×6
GOWN STRL REUS W/TWL XL LVL3 (GOWN DISPOSABLE) ×4
GRAFT COLLAGEN VASCULAR 8X45 (Miscellaneous) ×3 IMPLANT
HEMOSTAT SURGICEL 2X3 (HEMOSTASIS) ×3 IMPLANT
IV NS 500ML (IV SOLUTION) ×2
IV NS 500ML BAXH (IV SOLUTION) ×1 IMPLANT
KIT RM TURNOVER STRD PROC AR (KITS) ×3 IMPLANT
LABEL OR SOLS (LABEL) ×3 IMPLANT
LIQUID BAND (GAUZE/BANDAGES/DRESSINGS) ×3 IMPLANT
LOOP RED MAXI  1X406MM (MISCELLANEOUS) ×2
LOOP VESSEL MAXI 1X406 RED (MISCELLANEOUS) ×1 IMPLANT
LOOP VESSEL MINI 0.8X406 BLUE (MISCELLANEOUS) ×2 IMPLANT
LOOPS BLUE MINI 0.8X406MM (MISCELLANEOUS) ×4
NEEDLE FILTER BLUNT 18X 1/2SAF (NEEDLE) ×2
NEEDLE FILTER BLUNT 18X1 1/2 (NEEDLE) ×1 IMPLANT
NS IRRIG 500ML POUR BTL (IV SOLUTION) ×3 IMPLANT
PACK EXTREMITY ARMC (MISCELLANEOUS) ×3 IMPLANT
PAD GROUND ADULT SPLIT (MISCELLANEOUS) ×3 IMPLANT
PAD PREP 24X41 OB/GYN DISP (PERSONAL CARE ITEMS) IMPLANT
SPONGE XRAY 4X4 16PLY STRL (MISCELLANEOUS) ×3 IMPLANT
STOCKINETTE STRL 4IN 9604848 (GAUZE/BANDAGES/DRESSINGS) ×3 IMPLANT
SUT GTX CV-5 TTC13 DBL (SUTURE) ×12 IMPLANT
SUT MNCRL+ 5-0 UNDYED PC-3 (SUTURE) ×1 IMPLANT
SUT MONOCRYL 5-0 (SUTURE) ×2
SUT PROLENE 3 0 SH DA (SUTURE) ×6 IMPLANT
SUT PROLENE 6 0 BV (SUTURE) ×12 IMPLANT
SUT SILK 2 0 (SUTURE) ×2
SUT SILK 2-0 18XBRD TIE 12 (SUTURE) ×1 IMPLANT
SUT SILK 3 0 (SUTURE) ×2
SUT SILK 3-0 18XBRD TIE 12 (SUTURE) ×1 IMPLANT
SUT SILK 4 0 (SUTURE) ×2
SUT SILK 4-0 18XBRD TIE 12 (SUTURE) ×1 IMPLANT
SUT VIC AB 3-0 SH 27 (SUTURE) ×4
SUT VIC AB 3-0 SH 27X BRD (SUTURE) ×2 IMPLANT
SYR 20CC LL (SYRINGE) ×3 IMPLANT
SYR 3ML LL SCALE MARK (SYRINGE) ×3 IMPLANT
SYR 50ML LL SCALE MARK (SYRINGE) ×3 IMPLANT

## 2015-03-08 NOTE — Transfer of Care (Signed)
Immediate Anesthesia Transfer of Care Note  Patient: [name]  Procedure(s) Performed: Procedure(s): REVISION OF ARTERIOVENOUS GORETEX GRAFT (Right)  Patient Location: PACU  Anesthesia Type:General  Level of Consciousness: awake, alert , oriented and patient cooperative  Airway & Oxygen Therapy: Patient Spontanous Breathing and Patient connected to face mask oxygen  Post-op Assessment: Report given to RN, Post -op Vital signs reviewed and stable and Patient moving all extremities X 4  Post vital signs: Reviewed and stable  Last Vitals:  Filed Vitals:   03/08/15 1603  BP: 147/68  Pulse: 90  Temp: 36.6 C  Resp: 19    Complications: No apparent anesthesia complications

## 2015-03-08 NOTE — Anesthesia Preprocedure Evaluation (Signed)
Anesthesia Evaluation  Patient identified by MRN, date of birth, ID band Patient awake    Reviewed: Allergy & Precautions, NPO status , Patient's Chart, lab work & pertinent test results  Airway Mallampati: II  TM Distance: >3 FB Neck ROM: Limited    Dental  (+) Upper Dentures, Lower Dentures   Pulmonary    Pulmonary exam normal        Cardiovascular Exercise Tolerance: Good hypertension, Pt. on medications Normal cardiovascular exam     Neuro/Psych Anxiety Depression    GI/Hepatic GERD  Medicated and Controlled,  Endo/Other  diabetes, Type 2  Renal/GU ESRF and DialysisRenal disease     Musculoskeletal  (+) Arthritis , Osteoarthritis,    Abdominal (+)  Abdomen: soft.    Peds  Hematology  (+) anemia , Hb 10.1.   Anesthesia Other Findings   Reproductive/Obstetrics                             Anesthesia Physical Anesthesia Plan  ASA: IV  Anesthesia Plan: General   Post-op Pain Management:    Induction: Intravenous  Airway Management Planned: Oral ETT  Additional Equipment:   Intra-op Plan:   Post-operative Plan: Extubation in OR  Informed Consent: I have reviewed the patients History and Physical, chart, labs and discussed the procedure including the risks, benefits and alternatives for the proposed anesthesia with the patient or authorized representative who has indicated his/her understanding and acceptance.     Plan Discussed with: CRNA  Anesthesia Plan Comments:         Anesthesia Quick Evaluation

## 2015-03-08 NOTE — Discharge Instructions (Signed)
AMBULATORY SURGERY  °DISCHARGE INSTRUCTIONS ° ° °1) The drugs that you were given will stay in your system until tomorrow so for the next 24 hours you should not: ° °A) Drive an automobile °B) Make any legal decisions °C) Drink any alcoholic beverage ° ° °2) You may resume regular meals tomorrow.  Today it is better to start with liquids and gradually work up to solid foods. ° °You may eat anything you prefer, but it is better to start with liquids, then soup and crackers, and gradually work up to solid foods. ° ° °3) Please notify your doctor immediately if you have any unusual bleeding, trouble breathing, redness and pain at the surgery site, drainage, fever, or pain not relieved by medication. ° ° ° °4) Additional Instructions: ° ° ° ° ° ° ° °Please contact your physician with any problems or Same Day Surgery at [phone], Monday through Friday 6 am to 4 pm, or [place] [facility] [place] Main number at [phone].AMBULATORY SURGERY  °DISCHARGE INSTRUCTIONS ° ° °5) The drugs that you were given will stay in your system until tomorrow so for the next 24 hours you should not: ° °D) Drive an automobile °E) Make any legal decisions °F) Drink any alcoholic beverage ° ° °6) You may resume regular meals tomorrow.  Today it is better to start with liquids and gradually work up to solid foods. ° °You may eat anything you prefer, but it is better to start with liquids, then soup and crackers, and gradually work up to solid foods. ° ° °7) Please notify your doctor immediately if you have any unusual bleeding, trouble breathing, redness and pain at the surgery site, drainage, fever, or pain not relieved by medication. ° ° ° °8) Additional Instructions: ° ° ° ° ° ° ° °Please contact your physician with any problems or Same Day Surgery at [phone], Monday through Friday 6 am to 4 pm, or [place] at  Main number at [phone]. °

## 2015-03-08 NOTE — Anesthesia Postprocedure Evaluation (Signed)
  Anesthesia Post-op Note  Patient: [name]  Procedure(s) Performed: Procedure(s): REVISION OF ARTERIOVENOUS GORETEX GRAFT (Right)  Anesthesia type:General  Patient location: PACU  Post pain: Pain level controlled  Post assessment: Post-op Vital signs reviewed, Patient's Cardiovascular Status Stable, Respiratory Function Stable, Patent Airway and No signs of Nausea or vomiting  Post vital signs: Reviewed and stable  Last Vitals:  Filed Vitals:   03/08/15 1632  BP: 136/43  Pulse: 72  Temp:   Resp: 16    Level of consciousness: awake, alert  and patient cooperative  Complications: No apparent anesthesia complications

## 2015-03-08 NOTE — H&P (Signed)
[facility] History & Physical Update  The patient was interviewed and re-examined.  The patient's previous History and Physical has been reviewed and is unchanged.  There is no change in the plan of care. We plan to proceed with the scheduled procedure.  Ary Lavine, Latina Craver, MD  03/08/2015, 3:33 PM

## 2015-03-08 NOTE — Op Note (Signed)
     OPERATIVE NOTE   PROCEDURE: Revision right brachial hero graft  PRE-OPERATIVE DIAGNOSIS: Complication dialysis access with ulceration of the existing graft; End Stage Renal Disease  POST-OPERATIVE DIAGNOSIS: Same  SURGEON: Schnier, Latina Craver  ASSISTANT(S): Ms. Raul Del  ANESTHESIA: general  ESTIMATED BLOOD LOSS: <50 cc  FINDING(S): PTFE portion of the hero graft well incorporated at the arterial anastomosis and up in the shoulder by the grommet  SPECIMEN(S):  none  INDICATIONS:   [name] is a 81 y.o. female who presents with end stage renal disease.  The patient is scheduled for right brachiocephalic arteriovenous fistula placement.  The patient is aware the risks include but are not limited to: bleeding, infection, steal syndrome, nerve damage, ischemic monomelic neuropathy, failure to mature, and need for additional procedures.  The patient is aware of the risks of the procedure and elects to proceed forward.  DESCRIPTION: After full informed written consent was obtained from the patient, the patient was brought back to the operating room and placed supine upon the operating table.  Prior to induction, the patient received IV antibiotics.   After obtaining adequate anesthesia, the patient was then prepped and draped in the standard fashion for a right arm access procedure.    A curvilinear incision was then created through the previous incisional scar and the dissection is carried down to expose the existing graft. Graft is then dissected circumferentially for a distance of approximately 2-1/2-3 cm and looped with a Silastic vessel loop.  Attention was then turned to the shoulder where the PTFE portion of the graft is exposed at the level of the grommet and dissected circumferentially for a distance of approximately 2-3 cm.  Curved tunneler is then used to create a pathway and a 7 mm Artegraft is pulled subcutaneously. The arterial anastomosis is dressed first.  The PTFE portion of the graft is transected and the upper and is oversewn with 2-0 Prolene. The graft is then imbricated with the surrounding soft tissues. The open-end is then matched to the Artegraft and an end to end anastomosis fashioned with running CV 5 suture. Flushing maneuvers were performed and aggressive graft is pressurized. He has a easy to feel nice lie to it. It is then matched to the PTFE portion of the hero graft at the shoulder. The lower end of the hero graft is then oversewn in 2-0 Prolene and indicated just as the previous segment was. The cut and is then spatulated and an end to end anastomosis fashioned with running CV 5 suture. Flushing maneuvers were performed and flow was established through the hero graft through the revised Artegraft portion. Both anastomoses were inspected for hemostasis. And then both wounds were irrigated with several 100 cc of fluid.  There was good  thrill in the venous outflow, and there was 1+ palpable brachial pulse.  There was no further active bleeding.  The subcutaneous tissue was reapproximated with a running stitch of 3-0 Vicryl.  The skin was then reapproximated with a running subcuticular stitch of 4-0 Vicryl.  The skin was then cleaned, dried, and reinforced with Dermabond.    The patient tolerated this procedure well.   COMPLICATIONS: None  CONDITION: [name]  Office: [phone]   03/08/2015, 3:37 PM

## 2015-03-08 NOTE — Anesthesia Procedure Notes (Signed)
Procedure Name: Intubation Date/Time: 03/08/2015 1:56 PM Performed by: Irving Burton Pre-anesthesia Checklist: Patient identified, Emergency Drugs available, Suction available and Patient being monitored Patient Re-evaluated:Patient Re-evaluated prior to inductionOxygen Delivery Method: Circle system utilized Preoxygenation: Pre-oxygenation with 100% oxygen Intubation Type: IV induction Ventilation: Mask ventilation without difficulty Laryngoscope Size: Mac and 3 Grade View: Grade I Tube type: Oral Number of attempts: 1 Airway Equipment and Method: Patient positioned with wedge pillow and Stylet Placement Confirmation: ETT inserted through vocal cords under direct vision,  positive ETCO2 and breath sounds checked- equal and bilateral Secured at: 21 cm Tube secured with: Tape Dental Injury: Teeth and Oropharynx as per pre-operative assessment

## 2015-03-08 NOTE — OR Nursing (Signed)
Right thigh catheter dry and intact, no redness or swelling.

## 2015-03-09 DIAGNOSIS — N186 End stage renal disease: Secondary | ICD-10-CM | POA: Diagnosis not present

## 2015-03-09 DIAGNOSIS — D689 Coagulation defect, unspecified: Secondary | ICD-10-CM | POA: Diagnosis not present

## 2015-03-09 DIAGNOSIS — A047 Enterocolitis due to Clostridium difficile: Secondary | ICD-10-CM | POA: Diagnosis not present

## 2015-03-09 DIAGNOSIS — T827XXA Infection and inflammatory reaction due to other cardiac and vascular devices, implants and grafts, initial encounter: Secondary | ICD-10-CM | POA: Diagnosis not present

## 2015-03-09 DIAGNOSIS — D509 Iron deficiency anemia, unspecified: Secondary | ICD-10-CM | POA: Diagnosis not present

## 2015-03-09 DIAGNOSIS — D631 Anemia in chronic kidney disease: Secondary | ICD-10-CM | POA: Diagnosis not present

## 2015-03-11 ENCOUNTER — Encounter: Payer: Self-pay | Admitting: Vascular Surgery

## 2015-03-12 DIAGNOSIS — A047 Enterocolitis due to Clostridium difficile: Secondary | ICD-10-CM | POA: Diagnosis not present

## 2015-03-12 DIAGNOSIS — N186 End stage renal disease: Secondary | ICD-10-CM | POA: Diagnosis not present

## 2015-03-12 DIAGNOSIS — D509 Iron deficiency anemia, unspecified: Secondary | ICD-10-CM | POA: Diagnosis not present

## 2015-03-12 DIAGNOSIS — T827XXA Infection and inflammatory reaction due to other cardiac and vascular devices, implants and grafts, initial encounter: Secondary | ICD-10-CM | POA: Diagnosis not present

## 2015-03-12 DIAGNOSIS — D689 Coagulation defect, unspecified: Secondary | ICD-10-CM | POA: Diagnosis not present

## 2015-03-12 DIAGNOSIS — D631 Anemia in chronic kidney disease: Secondary | ICD-10-CM | POA: Diagnosis not present

## 2015-03-13 DIAGNOSIS — N186 End stage renal disease: Secondary | ICD-10-CM | POA: Diagnosis not present

## 2015-03-13 DIAGNOSIS — T82858D Stenosis of vascular prosthetic devices, implants and grafts, subsequent encounter: Secondary | ICD-10-CM | POA: Diagnosis not present

## 2015-03-13 DIAGNOSIS — Z992 Dependence on renal dialysis: Secondary | ICD-10-CM | POA: Diagnosis not present

## 2015-03-13 DIAGNOSIS — I871 Compression of vein: Secondary | ICD-10-CM | POA: Diagnosis not present

## 2015-03-14 DIAGNOSIS — D631 Anemia in chronic kidney disease: Secondary | ICD-10-CM | POA: Diagnosis not present

## 2015-03-14 DIAGNOSIS — D509 Iron deficiency anemia, unspecified: Secondary | ICD-10-CM | POA: Diagnosis not present

## 2015-03-14 DIAGNOSIS — T827XXA Infection and inflammatory reaction due to other cardiac and vascular devices, implants and grafts, initial encounter: Secondary | ICD-10-CM | POA: Diagnosis not present

## 2015-03-14 DIAGNOSIS — A047 Enterocolitis due to Clostridium difficile: Secondary | ICD-10-CM | POA: Diagnosis not present

## 2015-03-14 DIAGNOSIS — D689 Coagulation defect, unspecified: Secondary | ICD-10-CM | POA: Diagnosis not present

## 2015-03-14 DIAGNOSIS — N186 End stage renal disease: Secondary | ICD-10-CM | POA: Diagnosis not present

## 2015-03-15 ENCOUNTER — Other Ambulatory Visit: Payer: Self-pay

## 2015-03-15 MED ORDER — ESCITALOPRAM OXALATE 10 MG PO TABS: 5.0000 mg | ORAL_TABLET | Freq: Every day | ORAL | Status: DC

## 2015-03-15 NOTE — Telephone Encounter (Signed)
[name] (DPR signed) left v/m requesting refill lexapro to [facility] [address]; pt established care 01/18/15 and Dr Reece AgarG advised to continue low dose of lexapro.refill done per protocol and left v/m for [name] that refill sent to [facility] [address] and that previous refill request were going to previous physician and that is why [facility] had not responded to previous refill request.

## 2015-03-16 DIAGNOSIS — D509 Iron deficiency anemia, unspecified: Secondary | ICD-10-CM | POA: Diagnosis not present

## 2015-03-16 DIAGNOSIS — T827XXA Infection and inflammatory reaction due to other cardiac and vascular devices, implants and grafts, initial encounter: Secondary | ICD-10-CM | POA: Diagnosis not present

## 2015-03-16 DIAGNOSIS — A047 Enterocolitis due to Clostridium difficile: Secondary | ICD-10-CM | POA: Diagnosis not present

## 2015-03-16 DIAGNOSIS — N186 End stage renal disease: Secondary | ICD-10-CM | POA: Diagnosis not present

## 2015-03-16 DIAGNOSIS — D689 Coagulation defect, unspecified: Secondary | ICD-10-CM | POA: Diagnosis not present

## 2015-03-16 DIAGNOSIS — D631 Anemia in chronic kidney disease: Secondary | ICD-10-CM | POA: Diagnosis not present

## 2015-03-19 DIAGNOSIS — D689 Coagulation defect, unspecified: Secondary | ICD-10-CM | POA: Diagnosis not present

## 2015-03-19 DIAGNOSIS — N186 End stage renal disease: Secondary | ICD-10-CM | POA: Diagnosis not present

## 2015-03-19 DIAGNOSIS — T827XXA Infection and inflammatory reaction due to other cardiac and vascular devices, implants and grafts, initial encounter: Secondary | ICD-10-CM | POA: Diagnosis not present

## 2015-03-19 DIAGNOSIS — D509 Iron deficiency anemia, unspecified: Secondary | ICD-10-CM | POA: Diagnosis not present

## 2015-03-19 DIAGNOSIS — A047 Enterocolitis due to Clostridium difficile: Secondary | ICD-10-CM | POA: Diagnosis not present

## 2015-03-19 DIAGNOSIS — D631 Anemia in chronic kidney disease: Secondary | ICD-10-CM | POA: Diagnosis not present

## 2015-03-21 ENCOUNTER — Encounter (HOSPITAL_COMMUNITY): Payer: Self-pay | Admitting: Emergency Medicine

## 2015-03-21 DIAGNOSIS — Y712 Prosthetic and other implants, materials and accessory cardiovascular devices associated with adverse incidents: Secondary | ICD-10-CM | POA: Insufficient documentation

## 2015-03-21 DIAGNOSIS — D689 Coagulation defect, unspecified: Secondary | ICD-10-CM | POA: Diagnosis not present

## 2015-03-21 DIAGNOSIS — I120 Hypertensive chronic kidney disease with stage 5 chronic kidney disease or end stage renal disease: Secondary | ICD-10-CM | POA: Insufficient documentation

## 2015-03-21 DIAGNOSIS — A047 Enterocolitis due to Clostridium difficile: Secondary | ICD-10-CM | POA: Diagnosis not present

## 2015-03-21 DIAGNOSIS — Z7982 Long term (current) use of aspirin: Secondary | ICD-10-CM | POA: Diagnosis not present

## 2015-03-21 DIAGNOSIS — Z8709 Personal history of other diseases of the respiratory system: Secondary | ICD-10-CM | POA: Diagnosis not present

## 2015-03-21 DIAGNOSIS — T82838A Hemorrhage of vascular prosthetic devices, implants and grafts, initial encounter: Secondary | ICD-10-CM | POA: Insufficient documentation

## 2015-03-21 DIAGNOSIS — Z9889 Other specified postprocedural states: Secondary | ICD-10-CM | POA: Insufficient documentation

## 2015-03-21 DIAGNOSIS — D631 Anemia in chronic kidney disease: Secondary | ICD-10-CM | POA: Diagnosis not present

## 2015-03-21 DIAGNOSIS — E785 Hyperlipidemia, unspecified: Secondary | ICD-10-CM | POA: Diagnosis not present

## 2015-03-21 DIAGNOSIS — M1990 Unspecified osteoarthritis, unspecified site: Secondary | ICD-10-CM | POA: Insufficient documentation

## 2015-03-21 DIAGNOSIS — Z8619 Personal history of other infectious and parasitic diseases: Secondary | ICD-10-CM | POA: Insufficient documentation

## 2015-03-21 DIAGNOSIS — N186 End stage renal disease: Secondary | ICD-10-CM | POA: Diagnosis not present

## 2015-03-21 DIAGNOSIS — Z992 Dependence on renal dialysis: Secondary | ICD-10-CM | POA: Diagnosis not present

## 2015-03-21 DIAGNOSIS — F329 Major depressive disorder, single episode, unspecified: Secondary | ICD-10-CM | POA: Diagnosis not present

## 2015-03-21 DIAGNOSIS — Z8719 Personal history of other diseases of the digestive system: Secondary | ICD-10-CM | POA: Diagnosis not present

## 2015-03-21 DIAGNOSIS — F419 Anxiety disorder, unspecified: Secondary | ICD-10-CM | POA: Diagnosis not present

## 2015-03-21 DIAGNOSIS — Z79899 Other long term (current) drug therapy: Secondary | ICD-10-CM | POA: Insufficient documentation

## 2015-03-21 DIAGNOSIS — E119 Type 2 diabetes mellitus without complications: Secondary | ICD-10-CM | POA: Diagnosis not present

## 2015-03-21 DIAGNOSIS — T827XXA Infection and inflammatory reaction due to other cardiac and vascular devices, implants and grafts, initial encounter: Secondary | ICD-10-CM | POA: Diagnosis not present

## 2015-03-21 DIAGNOSIS — D509 Iron deficiency anemia, unspecified: Secondary | ICD-10-CM | POA: Diagnosis not present

## 2015-03-21 NOTE — ED Notes (Signed)
Pt. reports bleeding at hemodialysis catheter this evening at right thigh , denies injury at insertion site , no bleeding at arrival .

## 2015-03-22 ENCOUNTER — Emergency Department (HOSPITAL_COMMUNITY)
Admission: EM | Admit: 2015-03-22 | Discharge: 2015-03-22 | Disposition: A | Discharge status: Home / Self Care | Payer: Medicare Other | Attending: Emergency Medicine | Admitting: Emergency Medicine

## 2015-03-22 DIAGNOSIS — T82838A Hemorrhage of vascular prosthetic devices, implants and grafts, initial encounter: Secondary | ICD-10-CM

## 2015-03-22 NOTE — ED Provider Notes (Signed)
CSN: [id number]     Arrival date & time 03/21/15  2140 History  By signing my name below, I, Freida Busmaniana Omoyeni, attest that this documentation has been prepared under the direction and in the presence of Laurence Spatesachel Morgan Murrell Dome, MD . Electronically Signed: Freida Busmaniana Omoyeni, Scribe. 03/22/2015. 12:29 AM.  Chief Complaint  Patient presents with  . Vascular Access Problem    The history is provided by the patient. No language interpreter was used.   HPI Comments:  [name] is a 81 y.o. female with a history of ESRD and Tu/Thr/Sat hemodialysis, who presents to the Emergency Department complaining of bleeding from her dialysis cath on her RLE since ~ 1930/2000. Pt states the bleeding started after she bumped  the cath during her bath.  Pt last received dialysis yesterday (03/21/2015). Pt has no other complaints or symptoms at this time. RUE fistula recently placed and is healing appropriately.  Past Medical History  Diagnosis Date  . Cough   . Diabetes mellitus     type 2  . GERD (gastroesophageal reflux disease)   . Hypertension   . Bronchitis   . Osteoarthritis   . Dyslipidemia     remote hx/notes 10/06/2009  . ESRD (end stage renal disease) on dialysis [facility](HCC)     Dr Coladonato/Powell  . Depression     lexapro  . Staphylococcus aureus bacteremia with sepsis (HCC)     thought from HD catheter  . Anxiety    Past Surgical History  Procedure Laterality Date  . Eye surgery      Cataract extraction  . Thrombectomy and revision of arterioventous (av) goretex  graft Left 12/2004; 01/2006; 03/12/2009    forearmnotes 10/13/2010; forearmnotes 10/13/2010; upper arm/notes 03/19/2009  . Abdominal hysterectomy      remote hx/notes 10/06/2009  . Vitrectomy Left 03/2001    with membrane peel/notes 10/13/2010  . Arteriovenous graft placement Left 07/2004    forearm/notes 10/13/2010  . Arteriovenous graft placement Left 03/2006    upper armnotes 10/13/2010  . Peripheral vascular catheterization Right  02/18/2015    Procedure: A/V Shuntogram/Fistulagram;  Surgeon: Annice NeedyJason S [name], MD;  Location: [facility] INVASIVE CV LAB;  Service: Cardiovascular;  Laterality: Right;  . Peripheral vascular catheterization N/A 02/18/2015    Procedure: A/V Shunt Intervention;  Surgeon: Annice NeedyJason S [name], MD;  Location: [facility] INVASIVE CV LAB;  Service: Cardiovascular;  Laterality: N/A;  . Peripheral vascular catheterization N/A 03/06/2015    Procedure: Dialysis/Perma Catheter Insertion;  Surgeon: Annice NeedyJason S [name], MD;  Location: [facility] INVASIVE CV LAB;  Service: Cardiovascular;  Laterality: N/A;  . Revision of arteriovenous goretex graft Right 03/08/2015    Procedure: REVISION OF ARTERIOVENOUS GORETEX GRAFT;  Surgeon: Renford DillsGregory G Schnier, MD;  Location: [facility] ORS;  Service: Vascular;  Laterality: Right;   Family History  Problem Relation Age of Onset  . Hypertension Mother    Social History  Substance Use Topics  . Smoking status: Never Smoker   . Smokeless tobacco: None  . Alcohol Use: No   OB History    No data available     Review of Systems  10 systems reviewed and all are negative for acute change except as noted in the HPI.  Allergies  Review of patient's allergies indicates no known allergies.  Home Medications   Prior to Admission medications   Medication Sig Start Date End Date Taking? Authorizing Provider  acetaminophen (TYLENOL) 500 MG tablet Take 500 mg by mouth 2 (two) times daily as needed.  Historical Provider, MD  amLODipine (NORVASC) 5 MG tablet Take 5 mg by mouth daily. Take half tablet on Sundays mondays wednesdays and fridays 04/05/12   Eulis Foster, FNP  aspirin 81 MG tablet Take 81 mg by mouth daily.    Historical Provider, MD  cephALEXin (KEFLEX) 500 MG capsule Take 1 capsule (500 mg total) by mouth 2 (two) times daily. Patient not taking: Reported on 02/18/2015 02/08/15   Tomasita Crumble, MD  cinacalcet (SENSIPAR) 30 MG tablet Take 30 mg by mouth daily.    Historical Provider, MD  Dorzolamide HCl-Timolol  Mal (COSOPT OP) Place 1 drop into both eyes daily.     Historical Provider, MD  escitalopram (LEXAPRO) 10 MG tablet Take 0.5 tablets (5 mg total) by mouth daily. 03/15/15   Eustaquio Boyden, MD  famotidine (PEPCID) 20 MG tablet Take 1 tablet (20 mg total) by mouth 2 (two) times daily. Patient taking differently: Take 20 mg by mouth daily.  11/20/14   Calvert Cantor, MD  folic acid-vitamin b complex-vitamin c-selenium-zinc (DIALYVITE) 3 MG TABS Take 1 tablet by mouth daily.      Historical Provider, MD  HYDROcodone-acetaminophen (NORCO) 5-325 MG tablet Take 1-2 tablets by mouth every 6 (six) hours as needed for moderate pain. 03/08/15   Renford Dills, MD  loperamide (IMODIUM) 2 MG capsule Take 1 capsule (2 mg total) by mouth as needed for diarrhea or loose stools. 07/28/13   Maryann Mikhail, DO  multivitamin (RENA-VIT) TABS tablet Take 1 tablet by mouth at bedtime. 02/01/15   Eustaquio Boyden, MD  promethazine (PHENERGAN) 25 MG tablet Take 12.5 mg by mouth every 6 (six) hours as needed for nausea or vomiting.    Historical Provider, MD  quiNINE (QUALAQUIN) 324 MG capsule Take 324 mg by mouth 3 (three) times a week. Take on tuesdays thursdays and saturdays    Historical Provider, MD  simvastatin (ZOCOR) 40 MG tablet Take 40 mg by mouth daily.    Historical Provider, MD   BP 153/54 mmHg  Pulse 82  Temp(Src) 98.6 F (37 C) (Oral)  Resp 20  Wt 135 lb (61.236 kg)  SpO2 97% Physical Exam  Constitutional: She is oriented to person, place, and time. She appears well-developed and well-nourished. No distress.  HENT:  Head: Normocephalic and atraumatic.  Moist mucous membranes  Cardiovascular: Normal rate, regular rhythm and normal heart sounds.   No murmur heard. Pulmonary/Chest: Effort normal and breath sounds normal.  Abdominal: Soft.  Musculoskeletal: She exhibits no edema.  Fistula in right upper arm with bandages in place no erythema and  drainage   Neurological: She is alert and oriented to  person, place, and time.  Fluent speech  Skin: Skin is warm and dry.  Vascular cath in right thigh with small amount of blood  Of bandage; No active bleeding; Suture in place  No surrounding skin changes    Psychiatric: She has a normal mood and affect. Judgment normal.  Nursing note and vitals reviewed.   ED Course  Procedures   DIAGNOSTIC STUDIES:  Oxygen Saturation is 97% on RA, normal by my interpretation.    COORDINATION OF CARE:  12:21 AM Advised pt and daughter to follow up with nephrologist if it begins to ooze at home. Discussed treatment plan with pt and daughter at bedside and they agreed to plan.    MDM   Final diagnoses:  Bleeding from peripherally inserted central venous catheter (PICC), initial encounter [facility])   patient presents with bleeding from her  right leg vascular Site after she bumped it while bathing. Patient comfortable with no active bleeding on exam. No surrounding hematoma. Changed bloody bandage and instructed to contact nephrologist if any problems tomorrow. Return precautions reviewed with the patient and her daughter and they voiced understanding. Patient discharged in satisfactory condition.  I personally performed the services described in this documentation, which was scribed in my presence. The recorded information has been reviewed and is accurate.   Laurence Spates, MD 03/22/15 [phone]

## 2015-03-22 NOTE — ED Notes (Signed)
Discharge instructions reviewed - voiced understanding 

## 2015-03-22 NOTE — ED Notes (Signed)
Dressing changed to dialysis catheter in right upper leg.  Bleeding stopped.  Patient cleaned up of small amount of stool

## 2015-03-23 DIAGNOSIS — T827XXA Infection and inflammatory reaction due to other cardiac and vascular devices, implants and grafts, initial encounter: Secondary | ICD-10-CM | POA: Diagnosis not present

## 2015-03-23 DIAGNOSIS — N186 End stage renal disease: Secondary | ICD-10-CM | POA: Diagnosis not present

## 2015-03-23 DIAGNOSIS — D689 Coagulation defect, unspecified: Secondary | ICD-10-CM | POA: Diagnosis not present

## 2015-03-23 DIAGNOSIS — D631 Anemia in chronic kidney disease: Secondary | ICD-10-CM | POA: Diagnosis not present

## 2015-03-23 DIAGNOSIS — A047 Enterocolitis due to Clostridium difficile: Secondary | ICD-10-CM | POA: Diagnosis not present

## 2015-03-23 DIAGNOSIS — D509 Iron deficiency anemia, unspecified: Secondary | ICD-10-CM | POA: Diagnosis not present

## 2015-03-26 DIAGNOSIS — D689 Coagulation defect, unspecified: Secondary | ICD-10-CM | POA: Diagnosis not present

## 2015-03-26 DIAGNOSIS — D631 Anemia in chronic kidney disease: Secondary | ICD-10-CM | POA: Diagnosis not present

## 2015-03-26 DIAGNOSIS — N186 End stage renal disease: Secondary | ICD-10-CM | POA: Diagnosis not present

## 2015-03-26 DIAGNOSIS — A047 Enterocolitis due to Clostridium difficile: Secondary | ICD-10-CM | POA: Diagnosis not present

## 2015-03-26 DIAGNOSIS — T827XXA Infection and inflammatory reaction due to other cardiac and vascular devices, implants and grafts, initial encounter: Secondary | ICD-10-CM | POA: Diagnosis not present

## 2015-03-26 DIAGNOSIS — D509 Iron deficiency anemia, unspecified: Secondary | ICD-10-CM | POA: Diagnosis not present

## 2015-03-27 ENCOUNTER — Inpatient Hospital Stay (HOSPITAL_COMMUNITY): Admission: RE | Admit: 2015-03-27 | Payer: Medicare Other | Source: Ambulatory Visit

## 2015-03-27 ENCOUNTER — Ambulatory Visit (HOSPITAL_COMMUNITY)
Admission: RE | Admit: 2015-03-27 | Discharge: 2015-03-27 | Disposition: A | Discharge status: Home / Self Care | Payer: Medicare Other | Source: Ambulatory Visit | Attending: Nurse Practitioner | Admitting: Nurse Practitioner

## 2015-03-27 DIAGNOSIS — D649 Anemia, unspecified: Secondary | ICD-10-CM | POA: Diagnosis not present

## 2015-03-27 LAB — ABO/RH: ABO/RH(D): A POS

## 2015-03-27 LAB — PREPARE RBC (CROSSMATCH)

## 2015-03-27 MED ORDER — SODIUM CHLORIDE 0.9 % IV SOLN
Freq: Once | INTRAVENOUS | Status: AC
  Administered 2015-03-27: 11:00:00 via INTRAVENOUS

## 2015-03-27 NOTE — Progress Notes (Signed)
Patient ID: [name], female   DOB: 04/12/1929, [age] y.o.   MRN: [id number] With diagnosis of anemia [facility] transfusion of 1 unit of PRBC. Type and screen was performed, IV access established and blood product transfused. Pt had no changes in VS through out her stay; no reaction noted; upon completion pt reports feeling better. IV was discontinued. Tolerated procedure well. Pt discharged to home. Pt accompanied by daughter, taken out in a wheelchair. Nhu Glasby, Garnette GunnerIrina,RN

## 2015-03-28 DIAGNOSIS — N186 End stage renal disease: Secondary | ICD-10-CM | POA: Diagnosis not present

## 2015-03-28 DIAGNOSIS — A047 Enterocolitis due to Clostridium difficile: Secondary | ICD-10-CM | POA: Diagnosis not present

## 2015-03-28 DIAGNOSIS — T827XXA Infection and inflammatory reaction due to other cardiac and vascular devices, implants and grafts, initial encounter: Secondary | ICD-10-CM | POA: Diagnosis not present

## 2015-03-28 DIAGNOSIS — D509 Iron deficiency anemia, unspecified: Secondary | ICD-10-CM | POA: Diagnosis not present

## 2015-03-28 DIAGNOSIS — D631 Anemia in chronic kidney disease: Secondary | ICD-10-CM | POA: Diagnosis not present

## 2015-03-28 DIAGNOSIS — D689 Coagulation defect, unspecified: Secondary | ICD-10-CM | POA: Diagnosis not present

## 2015-03-28 LAB — TYPE AND SCREEN
ABO/RH(D): A POS
Antibody Screen: NEGATIVE
Unit division: 0

## 2015-03-30 DIAGNOSIS — D689 Coagulation defect, unspecified: Secondary | ICD-10-CM | POA: Diagnosis not present

## 2015-03-30 DIAGNOSIS — N186 End stage renal disease: Secondary | ICD-10-CM | POA: Diagnosis not present

## 2015-03-30 DIAGNOSIS — D631 Anemia in chronic kidney disease: Secondary | ICD-10-CM | POA: Diagnosis not present

## 2015-03-30 DIAGNOSIS — D509 Iron deficiency anemia, unspecified: Secondary | ICD-10-CM | POA: Diagnosis not present

## 2015-03-30 DIAGNOSIS — T827XXA Infection and inflammatory reaction due to other cardiac and vascular devices, implants and grafts, initial encounter: Secondary | ICD-10-CM | POA: Diagnosis not present

## 2015-03-30 DIAGNOSIS — A047 Enterocolitis due to Clostridium difficile: Secondary | ICD-10-CM | POA: Diagnosis not present

## 2015-04-01 DIAGNOSIS — N186 End stage renal disease: Secondary | ICD-10-CM | POA: Diagnosis not present

## 2015-04-01 DIAGNOSIS — E1129 Type 2 diabetes mellitus with other diabetic kidney complication: Secondary | ICD-10-CM | POA: Diagnosis not present

## 2015-04-01 DIAGNOSIS — Z992 Dependence on renal dialysis: Secondary | ICD-10-CM | POA: Diagnosis not present

## 2015-04-02 DIAGNOSIS — G9340 Encephalopathy, unspecified: Secondary | ICD-10-CM | POA: Diagnosis not present

## 2015-04-02 DIAGNOSIS — A047 Enterocolitis due to Clostridium difficile: Secondary | ICD-10-CM | POA: Diagnosis not present

## 2015-04-02 DIAGNOSIS — N186 End stage renal disease: Secondary | ICD-10-CM | POA: Diagnosis not present

## 2015-04-02 DIAGNOSIS — D689 Coagulation defect, unspecified: Secondary | ICD-10-CM | POA: Diagnosis not present

## 2015-04-02 DIAGNOSIS — N2581 Secondary hyperparathyroidism of renal origin: Secondary | ICD-10-CM | POA: Diagnosis not present

## 2015-04-02 DIAGNOSIS — D509 Iron deficiency anemia, unspecified: Secondary | ICD-10-CM | POA: Diagnosis not present

## 2015-04-02 DIAGNOSIS — D631 Anemia in chronic kidney disease: Secondary | ICD-10-CM | POA: Diagnosis not present

## 2015-04-03 DIAGNOSIS — T82858A Stenosis of vascular prosthetic devices, implants and grafts, initial encounter: Secondary | ICD-10-CM | POA: Diagnosis not present

## 2015-04-03 DIAGNOSIS — E785 Hyperlipidemia, unspecified: Secondary | ICD-10-CM | POA: Diagnosis not present

## 2015-04-03 DIAGNOSIS — Y841 Kidney dialysis as the cause of abnormal reaction of the patient, or of later complication, without mention of misadventure at the time of the procedure: Secondary | ICD-10-CM | POA: Diagnosis not present

## 2015-04-03 DIAGNOSIS — T82318A Breakdown (mechanical) of other vascular grafts, initial encounter: Secondary | ICD-10-CM | POA: Diagnosis not present

## 2015-04-03 DIAGNOSIS — N186 End stage renal disease: Secondary | ICD-10-CM | POA: Diagnosis not present

## 2015-04-03 DIAGNOSIS — I10 Essential (primary) hypertension: Secondary | ICD-10-CM | POA: Diagnosis not present

## 2015-04-03 DIAGNOSIS — Z992 Dependence on renal dialysis: Secondary | ICD-10-CM | POA: Diagnosis not present

## 2015-04-04 DIAGNOSIS — D689 Coagulation defect, unspecified: Secondary | ICD-10-CM | POA: Diagnosis not present

## 2015-04-04 DIAGNOSIS — D509 Iron deficiency anemia, unspecified: Secondary | ICD-10-CM | POA: Diagnosis not present

## 2015-04-04 DIAGNOSIS — N186 End stage renal disease: Secondary | ICD-10-CM | POA: Diagnosis not present

## 2015-04-04 DIAGNOSIS — D631 Anemia in chronic kidney disease: Secondary | ICD-10-CM | POA: Diagnosis not present

## 2015-04-04 DIAGNOSIS — A047 Enterocolitis due to Clostridium difficile: Secondary | ICD-10-CM | POA: Diagnosis not present

## 2015-04-04 DIAGNOSIS — G9340 Encephalopathy, unspecified: Secondary | ICD-10-CM | POA: Diagnosis not present

## 2015-04-06 DIAGNOSIS — D631 Anemia in chronic kidney disease: Secondary | ICD-10-CM | POA: Diagnosis not present

## 2015-04-06 DIAGNOSIS — G9340 Encephalopathy, unspecified: Secondary | ICD-10-CM | POA: Diagnosis not present

## 2015-04-06 DIAGNOSIS — A047 Enterocolitis due to Clostridium difficile: Secondary | ICD-10-CM | POA: Diagnosis not present

## 2015-04-06 DIAGNOSIS — N186 End stage renal disease: Secondary | ICD-10-CM | POA: Diagnosis not present

## 2015-04-06 DIAGNOSIS — D509 Iron deficiency anemia, unspecified: Secondary | ICD-10-CM | POA: Diagnosis not present

## 2015-04-06 DIAGNOSIS — D689 Coagulation defect, unspecified: Secondary | ICD-10-CM | POA: Diagnosis not present

## 2015-04-08 ENCOUNTER — Emergency Department (HOSPITAL_COMMUNITY): Payer: Medicare Other

## 2015-04-08 ENCOUNTER — Emergency Department (INDEPENDENT_AMBULATORY_CARE_PROVIDER_SITE_OTHER)
Admission: EM | Admit: 2015-04-08 | Discharge: 2015-04-08 | Disposition: A | Discharge status: Nursing Home (Includes ICF) | Payer: Medicare Other | Source: Home / Self Care

## 2015-04-08 ENCOUNTER — Telehealth: Payer: Self-pay | Admitting: Family Medicine

## 2015-04-08 ENCOUNTER — Inpatient Hospital Stay (HOSPITAL_COMMUNITY)
Admission: EM | Admit: 2015-04-08 | Discharge: 2015-04-15 | DRG: 264 | Disposition: A | Discharge status: Home Health | Payer: Medicare Other | Attending: Internal Medicine | Admitting: Internal Medicine

## 2015-04-08 ENCOUNTER — Encounter (HOSPITAL_COMMUNITY): Payer: Self-pay | Admitting: Emergency Medicine

## 2015-04-08 DIAGNOSIS — Z8249 Family history of ischemic heart disease and other diseases of the circulatory system: Secondary | ICD-10-CM

## 2015-04-08 DIAGNOSIS — L989 Disorder of the skin and subcutaneous tissue, unspecified: Secondary | ICD-10-CM | POA: Diagnosis present

## 2015-04-08 DIAGNOSIS — D62 Acute posthemorrhagic anemia: Secondary | ICD-10-CM | POA: Diagnosis not present

## 2015-04-08 DIAGNOSIS — R4182 Altered mental status, unspecified: Secondary | ICD-10-CM

## 2015-04-08 DIAGNOSIS — I871 Compression of vein: Secondary | ICD-10-CM | POA: Diagnosis present

## 2015-04-08 DIAGNOSIS — G9340 Encephalopathy, unspecified: Secondary | ICD-10-CM | POA: Diagnosis present

## 2015-04-08 DIAGNOSIS — F329 Major depressive disorder, single episode, unspecified: Secondary | ICD-10-CM | POA: Diagnosis present

## 2015-04-08 DIAGNOSIS — M1990 Unspecified osteoarthritis, unspecified site: Secondary | ICD-10-CM | POA: Diagnosis present

## 2015-04-08 DIAGNOSIS — J939 Pneumothorax, unspecified: Secondary | ICD-10-CM

## 2015-04-08 DIAGNOSIS — N2581 Secondary hyperparathyroidism of renal origin: Secondary | ICD-10-CM | POA: Diagnosis present

## 2015-04-08 DIAGNOSIS — R410 Disorientation, unspecified: Secondary | ICD-10-CM | POA: Diagnosis not present

## 2015-04-08 DIAGNOSIS — K219 Gastro-esophageal reflux disease without esophagitis: Secondary | ICD-10-CM | POA: Diagnosis present

## 2015-04-08 DIAGNOSIS — N390 Urinary tract infection, site not specified: Secondary | ICD-10-CM | POA: Diagnosis present

## 2015-04-08 DIAGNOSIS — Z87440 Personal history of urinary (tract) infections: Secondary | ICD-10-CM

## 2015-04-08 DIAGNOSIS — A419 Sepsis, unspecified organism: Secondary | ICD-10-CM | POA: Diagnosis present

## 2015-04-08 DIAGNOSIS — I953 Hypotension of hemodialysis: Secondary | ICD-10-CM | POA: Diagnosis not present

## 2015-04-08 DIAGNOSIS — E785 Hyperlipidemia, unspecified: Secondary | ICD-10-CM | POA: Diagnosis present

## 2015-04-08 DIAGNOSIS — D509 Iron deficiency anemia, unspecified: Secondary | ICD-10-CM | POA: Diagnosis present

## 2015-04-08 DIAGNOSIS — A4151 Sepsis due to Escherichia coli [E. coli]: Secondary | ICD-10-CM

## 2015-04-08 DIAGNOSIS — Z419 Encounter for procedure for purposes other than remedying health state, unspecified: Secondary | ICD-10-CM

## 2015-04-08 DIAGNOSIS — T827XXA Infection and inflammatory reaction due to other cardiac and vascular devices, implants and grafts, initial encounter: Principal | ICD-10-CM

## 2015-04-08 DIAGNOSIS — Y846 Urinary catheterization as the cause of abnormal reaction of the patient, or of later complication, without mention of misadventure at the time of the procedure: Secondary | ICD-10-CM | POA: Diagnosis present

## 2015-04-08 DIAGNOSIS — E1122 Type 2 diabetes mellitus with diabetic chronic kidney disease: Secondary | ICD-10-CM | POA: Diagnosis present

## 2015-04-08 DIAGNOSIS — Z7982 Long term (current) use of aspirin: Secondary | ICD-10-CM

## 2015-04-08 DIAGNOSIS — Y832 Surgical operation with anastomosis, bypass or graft as the cause of abnormal reaction of the patient, or of later complication, without mention of misadventure at the time of the procedure: Secondary | ICD-10-CM | POA: Diagnosis present

## 2015-04-08 DIAGNOSIS — N186 End stage renal disease: Secondary | ICD-10-CM

## 2015-04-08 DIAGNOSIS — T83518A Infection and inflammatory reaction due to other urinary catheter, initial encounter: Secondary | ICD-10-CM | POA: Diagnosis present

## 2015-04-08 DIAGNOSIS — I96 Gangrene, not elsewhere classified: Secondary | ICD-10-CM | POA: Diagnosis not present

## 2015-04-08 DIAGNOSIS — F419 Anxiety disorder, unspecified: Secondary | ICD-10-CM | POA: Diagnosis present

## 2015-04-08 DIAGNOSIS — D696 Thrombocytopenia, unspecified: Secondary | ICD-10-CM | POA: Diagnosis present

## 2015-04-08 DIAGNOSIS — I120 Hypertensive chronic kidney disease with stage 5 chronic kidney disease or end stage renal disease: Secondary | ICD-10-CM | POA: Diagnosis present

## 2015-04-08 DIAGNOSIS — F0390 Unspecified dementia without behavioral disturbance: Secondary | ICD-10-CM | POA: Diagnosis present

## 2015-04-08 DIAGNOSIS — Z992 Dependence on renal dialysis: Secondary | ICD-10-CM

## 2015-04-08 DIAGNOSIS — E875 Hyperkalemia: Secondary | ICD-10-CM | POA: Diagnosis present

## 2015-04-08 DIAGNOSIS — E1121 Type 2 diabetes mellitus with diabetic nephropathy: Secondary | ICD-10-CM | POA: Diagnosis present

## 2015-04-08 LAB — CBC
HCT: 36.1 % (ref 36.0–46.0)
Hemoglobin: 11.4 g/dL — ABNORMAL LOW (ref 12.0–15.0)
MCH: 28.7 pg (ref 26.0–34.0)
MCHC: 31.6 g/dL (ref 30.0–36.0)
MCV: 90.9 fL (ref 78.0–100.0)
PLATELETS: 121 10*3/uL — AB (ref 150–400)
RBC: 3.97 MIL/uL (ref 3.87–5.11)
RDW: 18.9 % — AB (ref 11.5–15.5)
WBC: 6.2 10*3/uL (ref 4.0–10.5)

## 2015-04-08 LAB — COMPREHENSIVE METABOLIC PANEL
ALK PHOS: 83 U/L (ref 38–126)
ALT: 10 U/L — AB (ref 14–54)
ANION GAP: 13 (ref 5–15)
AST: 21 U/L (ref 15–41)
Albumin: 3.1 g/dL — ABNORMAL LOW (ref 3.5–5.0)
BILIRUBIN TOTAL: 1 mg/dL (ref 0.3–1.2)
BUN: 41 mg/dL — AB (ref 6–20)
CO2: 26 mmol/L (ref 22–32)
CREATININE: 8.72 mg/dL — AB (ref 0.44–1.00)
Calcium: 9.6 mg/dL (ref 8.9–10.3)
Chloride: 99 mmol/L — ABNORMAL LOW (ref 101–111)
GFR, EST AFRICAN AMERICAN: 4 mL/min — AB (ref >60)
GFR, EST NON AFRICAN AMERICAN: 4 mL/min — AB (ref >60)
Glucose, Bld: 93 mg/dL (ref 65–99)
Potassium: 5.4 mmol/L — ABNORMAL HIGH (ref 3.5–5.1)
SODIUM: 138 mmol/L (ref 135–145)
TOTAL PROTEIN: 7.9 g/dL (ref 6.5–8.1)

## 2015-04-08 LAB — CBG MONITORING, ED
Glucose-Capillary: 84 mg/dL (ref 65–99)
Glucose-Capillary: 86 mg/dL (ref 65–99)

## 2015-04-08 NOTE — ED Notes (Signed)
Pt here today with confusion, disorientation, and incontinence reported by her daughter-in-law.  She states this is a classic sign of a UTI for her and her doctor told her to bring her here to be checked.  It was explained to the family that she will have to be seen in the ED for her issues, due to her needing to be cathed for a sample and the possibility of hospitalization for her UTI.

## 2015-04-08 NOTE — Telephone Encounter (Signed)
[facility] [address] [facility] - Client TELEPHONE ADVICE RECORD [facility]  Patient Name: Michelle HatchetJOSIE Halteman  DOB: 1928/11/22    Initial Comment Caller States mother-n-law is weakness, disorientation, usually means that she is starting to have a UTI, those are the first symptoms the family knows when it is starting.    Nurse Assessment  Nurse: Elwyn LadeBurress, RN, Misty StanleyLisa Date/Time (Eastern Time): 04/08/2015 3:29:42 PM  Confirm and document reason for call. If symptomatic, describe symptoms. ---Caller States mother-n-law is weakness, disorientation, usually means that she is starting to have a UTI, those are the first symptoms the family knows when it is starting  Has the patient traveled out of the country within the last 30 days? ---No  Does the patient have any new or worsening symptoms? ---Yes  Will a triage be completed? ---Yes  Related visit to physician within the last 2 weeks? ---No  Does the PT have any chronic conditions? (i.e. diabetes, asthma, etc.) ---Yes  List chronic conditions. ---kidney disease- dialysis, diabetes, HTN     Guidelines    Guideline Title Affirmed Question Affirmed Notes  Confusion - Delirium [1] Difficult to awaken or acting confused (disoriented, slurred speech) AND [2] present now AND [3] diabetic    Final Disposition User   Call EMS 911 Now Burress, RN, Misty StanleyLisa    Comments  Caller made conference call to sister with pt for triage with outcome to call 911 due new confusion; outcome refused and caller states will take pt to ER, but would not say which. Did not want anyone to call her back   Referrals  GO TO FACILITY OTHER - SPECIFY  GO TO FACILITY OTHER - SPECIFY   Disagree/Comply: Disagree  Disagree/Comply Reason: Disagree with instructions

## 2015-04-08 NOTE — ED Provider Notes (Signed)
CSN: [id number]   Arrival date & time 04/08/15 1851  History  By signing my name below, I, [name], attest that this documentation has been prepared under the direction and in the presence of Michelle Hodgens, MD. Electronically Signed: Bethel [name], ED Scribe. 04/09/2015. 12:59 AM.  Chief Complaint  Patient presents with  . Altered Mental Status   Level V caveat secondary to AMS HPI Patient is a 81 y.o. female presenting with altered mental status. The history is provided by a relative. No language interpreter was used.  Altered Mental Status Presenting symptoms: behavior changes and disorientation   Severity:  Moderate Most recent episode:  Yesterday Episode history:  Multiple Timing:  Constant Progression:  Unchanged Chronicity:  Recurrent Context: recent infection   Context: not homeless   Associated symptoms: no abdominal pain    Analy Bassford [name] is a 81 y.o. female with history of ESRD on T/TH/S dialysis, DM, HTN, depression and anxiety who presents to the Emergency Department complaining of AMS with onset yesterday afternoon. Patient's daughter at the bedside states that the pt has not been acting like herself. Associated symptoms include bowel incontinence. Daughter states that these symptoms are characteristic of a UTI for the pt. She has had recurrent UTIs over the last 3 years frequently requiring hospitalization. The pt was seen at Urgent Care today but sent to the ED because she needs to be catheterized for a urine specimen as she no longer makes urine. No recent fever. She has also been recently treated for an infected graft in the RUE but is not currently on any abx.   Past Medical History  Diagnosis Date  . Cough   . Diabetes mellitus     type 2  . GERD (gastroesophageal reflux disease)   . Hypertension   . Bronchitis   . Osteoarthritis   . Dyslipidemia     remote hx/notes 10/06/2009  . ESRD (end stage renal disease) on dialysis [facility])     Dr  Coladonato/Powell  . Depression     lexapro  . Staphylococcus aureus bacteremia with sepsis (HCC)     thought from HD catheter  . Anxiety     Past Surgical History  Procedure Laterality Date  . Eye surgery      Cataract extraction  . Thrombectomy and revision of arterioventous (av) goretex  graft Left 12/2004; 01/2006; 03/12/2009    forearmnotes 10/13/2010; forearmnotes 10/13/2010; upper arm/notes 03/19/2009  . Abdominal hysterectomy      remote hx/notes 10/06/2009  . Vitrectomy Left 03/2001    with membrane peel/notes 10/13/2010  . Arteriovenous graft placement Left 07/2004    forearm/notes 10/13/2010  . Arteriovenous graft placement Left 03/2006    upper armnotes 10/13/2010  . Peripheral vascular catheterization Right 02/18/2015    Procedure: A/V Shuntogram/Fistulagram;  Surgeon: Annice Needy, MD;  Location: [facility] INVASIVE CV LAB;  Service: Cardiovascular;  Laterality: Right;  . Peripheral vascular catheterization N/A 02/18/2015    Procedure: A/V Shunt Intervention;  Surgeon: Annice Needy, MD;  Location: [facility] INVASIVE CV LAB;  Service: Cardiovascular;  Laterality: N/A;  . Peripheral vascular catheterization N/A 03/06/2015    Procedure: Dialysis/Perma Catheter Insertion;  Surgeon: Annice Needy, MD;  Location: [facility] INVASIVE CV LAB;  Service: Cardiovascular;  Laterality: N/A;  . Revision of arteriovenous goretex graft Right 03/08/2015    Procedure: REVISION OF ARTERIOVENOUS GORETEX GRAFT;  Surgeon: Renford Dills, MD;  Location: [facility] ORS;  Service: Vascular;  Laterality: Right;    Family History  Problem Relation Age of Onset  . Hypertension Mother     Social History  Substance Use Topics  . Smoking status: Never Smoker   . Smokeless tobacco: None  . Alcohol Use: No     Review of Systems  Unable to perform ROS: Mental status change  Gastrointestinal: Negative for abdominal pain.  All other systems reviewed and are negative.  Home Medications   Prior to Admission medications    Medication Sig Start Date End Date Taking? Authorizing Provider  acetaminophen (TYLENOL) 500 MG tablet Take 500 mg by mouth 2 (two) times daily as needed.   Yes Historical Provider, MD  amLODipine (NORVASC) 5 MG tablet Take 5 mg by mouth daily. Take half tablet on Sundays mondays wednesdays and fridays 04/05/12  Yes Eulis FosterPadonda B Webb, FNP  aspirin 81 MG tablet Take 81 mg by mouth daily.   Yes Historical Provider, MD  cinacalcet (SENSIPAR) 30 MG tablet Take 30 mg by mouth daily.   Yes Historical Provider, MD  Dorzolamide HCl-Timolol Mal (COSOPT OP) Place 1 drop into both eyes daily.    Yes Historical Provider, MD  escitalopram (LEXAPRO) 10 MG tablet Take 0.5 tablets (5 mg total) by mouth daily. 03/15/15  Yes Eustaquio BoydenJavier Gutierrez, MD  famotidine (PEPCID) 20 MG tablet Take 1 tablet (20 mg total) by mouth 2 (two) times daily. Patient taking differently: Take 20 mg by mouth daily.  11/20/14  Yes Calvert CantorSaima Rizwan, MD  folic acid-vitamin b complex-vitamin c-selenium-zinc (DIALYVITE) 3 MG TABS Take 1 tablet by mouth daily.     Yes Historical Provider, MD  HYDROcodone-acetaminophen (NORCO) 5-325 MG tablet Take 1-2 tablets by mouth every 6 (six) hours as needed for moderate pain. 03/08/15  Yes Renford DillsGregory G Schnier, MD  loperamide (IMODIUM) 2 MG capsule Take 1 capsule (2 mg total) by mouth as needed for diarrhea or loose stools. 07/28/13  Yes Maryann Mikhail, DO  multivitamin (RENA-VIT) TABS tablet Take 1 tablet by mouth at bedtime. 02/01/15  Yes Eustaquio BoydenJavier Gutierrez, MD  promethazine (PHENERGAN) 25 MG tablet Take 12.5 mg by mouth every 6 (six) hours as needed for nausea or vomiting.   Yes Historical Provider, MD  quiNINE (QUALAQUIN) 324 MG capsule Take 324 mg by mouth 3 (three) times a week. Take on tuesdays thursdays and saturdays   Yes Historical Provider, MD  simvastatin (ZOCOR) 40 MG tablet Take 40 mg by mouth daily.   Yes Historical Provider, MD  cephALEXin (KEFLEX) 500 MG capsule Take 1 capsule (500 mg total) by mouth 2 (two)  times daily. Patient not taking: Reported on 02/18/2015 02/08/15   Tomasita CrumbleAdeleke Oni, MD    Allergies  Review of patient's allergies indicates no known allergies.  Triage Vitals: BP 144/42 mmHg  Pulse 80  Temp(Src) 98.1 F (36.7 C) (Oral)  Resp 16  SpO2 99%  Physical Exam  Constitutional: She is oriented to person, place, and time. She appears well-developed and well-nourished. No distress.  HENT:  Head: Normocephalic.  Mouth/Throat: Oropharynx is clear and moist.  Moist mucous membranes No exudate  Eyes: EOM are normal. Pupils are equal, round, and reactive to light.  Neck: Normal range of motion. Neck supple.  Trachea midline  Cardiovascular: Normal rate and regular rhythm.   Pulmonary/Chest: Effort normal and breath sounds normal. No respiratory distress. She has no wheezes. She has no rales.  CTAB  Abdominal: Soft. Bowel sounds are normal. She exhibits no mass. There is no tenderness. There is no rebound and no guarding.  Musculoskeletal: Normal range of  motion. She exhibits no edema or tenderness.  Neurological: She is alert and oriented to person, place, and time.  Skin: Skin is warm and dry.  Psychiatric: She has a normal mood and affect. Her behavior is normal.  Nursing note and vitals reviewed.   ED Course  Procedures   DIAGNOSTIC STUDIES: Oxygen Saturation is 99% on RA, normal by my interpretation.    COORDINATION OF CARE: 11:11 PM Discussed treatment plan which includes CT head, CXR, and lab work with the patient's family member at bedside and she agreed to plan.  12:42 AM I re-evaluated the patient and provided an update on the results of her UA. The patient and her daughter are in agreement with the plan for admission and she will now be allowed to eat.   12:56 AM-Consult complete with Dr. Katrinka Blazing (Hospitalist). Patient case explained and discussed. He will see the pt and make orders about disposition. Call ended at 12:58 AM     Labs Reviewed  COMPREHENSIVE  METABOLIC PANEL - Abnormal; Notable for the following:    Potassium 5.4 (*)    Chloride 99 (*)    BUN 41 (*)    Creatinine, Ser 8.72 (*)    Albumin 3.1 (*)    ALT 10 (*)    GFR calc non Af Amer 4 (*)    GFR calc Af Amer 4 (*)    All other components within normal limits  CBC - Abnormal; Notable for the following:    Hemoglobin 11.4 (*)    RDW 18.9 (*)    Platelets 121 (*)    All other components within normal limits  URINALYSIS, ROUTINE W REFLEX MICROSCOPIC (NOT AT [facility]) - Abnormal; Notable for the following:    APPearance TURBID (*)    Hgb urine dipstick LARGE (*)    Protein, ur >300 (*)    Leukocytes, UA LARGE (*)    All other components within normal limits  URINE MICROSCOPIC-ADD ON - Abnormal; Notable for the following:    Bacteria, UA MANY (*)    All other components within normal limits  CBG MONITORING, ED  CBG MONITORING, ED    Imaging Review Dg Chest 2 View  04/08/2015  CLINICAL DATA:  Altered mental status. EXAM: CHEST  2 VIEW COMPARISON:  11/16/2014 FINDINGS: Unchanged large-bore right central venous catheter, tip at the atrial caval junction. Cardiomegaly is unchanged. No consolidation, pleural effusion or pneumothorax. No pulmonary edema. Vascular stent seen in the right arm. The bones are under mineralized. IMPRESSION: Stable cardiomegaly.  No acute pulmonary process. Electronically Signed   By: Rubye Oaks M.D.   On: 04/08/2015 23:46   Ct Head Wo Contrast  04/08/2015  CLINICAL DATA:  Confusion and disorientation today. EXAM: CT HEAD WITHOUT CONTRAST TECHNIQUE: Contiguous axial images were obtained from the base of the skull through the vertex without intravenous contrast. COMPARISON:  11/16/2014 FINDINGS: Stable age related cerebral atrophy, ventriculomegaly and periventricular white matter disease. No extra-axial fluid collections are identified. No CT findings for acute hemispheric infarction or intracranial hemorrhage. No mass lesions. The brainstem and  cerebellum are normal. Stable calcified right orbital mass. No skull fracture or bone lesion. The paranasal sinuses are grossly clear. Mild Stable left-sided ethmoid disease. Remote blow in type fracture of the right orbit is noted. IMPRESSION: 1. Stable age related cerebral atrophy, ventriculomegaly and periventricular white matter disease. No acute intracranial findings. 2. Stable calcified right orbital mass. 3. No acute bony findings. Electronically Signed   By: Rudie Meyer  M.D.   On: 04/08/2015 23:41    I personally reviewed and evaluated these images and lab results as a part of my medical decision-making.    MDM   Final diagnoses:  UTI (lower urinary tract infection)    Results for orders placed or performed during the hospital encounter of 04/08/15  Comprehensive metabolic panel  Result Value Ref Range   Sodium 138 135 - 145 mmol/L   Potassium 5.4 (H) 3.5 - 5.1 mmol/L   Chloride 99 (L) 101 - 111 mmol/L   CO2 26 22 - 32 mmol/L   Glucose, Bld 93 65 - 99 mg/dL   BUN 41 (H) 6 - 20 mg/dL   Creatinine, Ser 1.61 (H) 0.44 - 1.00 mg/dL   Calcium 9.6 8.9 - 09.6 mg/dL   Total Protein 7.9 6.5 - 8.1 g/dL   Albumin 3.1 (L) 3.5 - 5.0 g/dL   AST 21 15 - 41 U/L   ALT 10 (L) 14 - 54 U/L   Alkaline Phosphatase 83 38 - 126 U/L   Total Bilirubin 1.0 0.3 - 1.2 mg/dL   GFR calc non Af Amer 4 (L) >60 mL/min   GFR calc Af Amer 4 (L) >60 mL/min   Anion gap 13 5 - 15  CBC  Result Value Ref Range   WBC 6.2 4.0 - 10.5 K/uL   RBC 3.97 3.87 - 5.11 MIL/uL   Hemoglobin 11.4 (L) 12.0 - 15.0 g/dL   HCT 04.5 40.9 - 81.1 %   MCV 90.9 78.0 - 100.0 fL   MCH 28.7 26.0 - 34.0 pg   MCHC 31.6 30.0 - 36.0 g/dL   RDW 91.4 (H) 78.2 - 95.6 %   Platelets 121 (L) 150 - 400 K/uL  Urinalysis, Routine w reflex microscopic (not at [facility])  Result Value Ref Range   Color, Urine YELLOW YELLOW   APPearance TURBID (A) CLEAR   Specific Gravity, Urine 1.018 1.005 - 1.030   pH 8.0 5.0 - 8.0   Glucose, UA NEGATIVE  NEGATIVE mg/dL   Hgb urine dipstick LARGE (A) NEGATIVE   Bilirubin Urine NEGATIVE NEGATIVE   Ketones, ur NEGATIVE NEGATIVE mg/dL   Protein, ur >213 (A) NEGATIVE mg/dL   Urobilinogen, UA 0.2 0.0 - 1.0 mg/dL   Nitrite NEGATIVE NEGATIVE   Leukocytes, UA LARGE (A) NEGATIVE  Urine microscopic-add on  Result Value Ref Range   Squamous Epithelial / LPF RARE RARE   WBC, UA TOO NUMEROUS TO COUNT <3 WBC/hpf   RBC / HPF 21-50 <3 RBC/hpf   Bacteria, UA MANY (A) RARE   Urine-Other LESS THAN 10 mL OF URINE SUBMITTED   CBG monitoring, ED  Result Value Ref Range   Glucose-Capillary 84 65 - 99 mg/dL  CBG monitoring, ED  Result Value Ref Range   Glucose-Capillary 86 65 - 99 mg/dL   Dg Chest 2 View  01/05/5783  CLINICAL DATA:  Altered mental status. EXAM: CHEST  2 VIEW COMPARISON:  11/16/2014 FINDINGS: Unchanged large-bore right central venous catheter, tip at the atrial caval junction. Cardiomegaly is unchanged. No consolidation, pleural effusion or pneumothorax. No pulmonary edema. Vascular stent seen in the right arm. The bones are under mineralized. IMPRESSION: Stable cardiomegaly.  No acute pulmonary process. Electronically Signed   By: Rubye Oaks M.D.   On: 04/08/2015 23:46   Ct Head Wo Contrast  04/08/2015  CLINICAL DATA:  Confusion and disorientation today. EXAM: CT HEAD WITHOUT CONTRAST TECHNIQUE: Contiguous axial images were obtained from the base of the skull  through the vertex without intravenous contrast. COMPARISON:  11/16/2014 FINDINGS: Stable age related cerebral atrophy, ventriculomegaly and periventricular white matter disease. No extra-axial fluid collections are identified. No CT findings for acute hemispheric infarction or intracranial hemorrhage. No mass lesions. The brainstem and cerebellum are normal. Stable calcified right orbital mass. No skull fracture or bone lesion. The paranasal sinuses are grossly clear. Mild Stable left-sided ethmoid disease. Remote blow in type fracture  of the right orbit is noted. IMPRESSION: 1. Stable age related cerebral atrophy, ventriculomegaly and periventricular white matter disease. No acute intracranial findings. 2. Stable calcified right orbital mass. 3. No acute bony findings. Electronically Signed   By: Rudie Meyer M.D.   On: 04/08/2015 23:41    Medications  cefTRIAXone (ROCEPHIN) 1 g in dextrose 5 % 50 mL IVPB (not administered)    I personally performed the services described in this documentation, which was scribed in my presence. The recorded information has been reviewed and is accurate.      Cy Blamer, MD 04/09/15 [phone]

## 2015-04-08 NOTE — Telephone Encounter (Signed)
Daughter called in wanting to get a call back.  Please call [phone]. It is in regards to mom getting confused and would like to have rx for antibiotic. Thank you

## 2015-04-08 NOTE — ED Notes (Signed)
The patient was sent from UC for a possible UTI.  The patient is incontinent of urine and the family says it is typical for her when she has a UTI.  The patient is afebrile and the family drove her from UC to be seen here in the ED.

## 2015-04-08 NOTE — Telephone Encounter (Signed)
[facility] chart review [facility].

## 2015-04-08 NOTE — ED Notes (Signed)
Patient here with family member who has brought patient for altered mental status. Family reports that usually when patient has these symptoms a UTI is found. Normally patient is A+O x 4 and continent. Currently A+O x 2, and family reports incontinence. ESRD present with infected graft in right arm, old graft in left arm, currently using VAS cath in right groin. BPs collected from right lower leg.

## 2015-04-08 NOTE — ED Provider Notes (Signed)
CSN: [id number]     Arrival date & time 04/08/15  1712 History   None    No chief complaint on file.  (Consider location/radiation/quality/duration/timing/severity/associated sxs/prior Treatment) HPI Comments: Patient daughter states her mother has become confused and disoriented today.  Patient has hx of ESRD.  Her daughter states she has become like this when she has UTI.    Patient is a 81 y.o. female presenting with urinary tract infection. The history is provided by the patient.  Urinary Tract Infection Pain severity:  No pain Onset quality:  Sudden Duration:  1 day Timing:  Constant Progression:  Worsening Chronicity:  New Recent urinary tract infections: no     Past Medical History  Diagnosis Date  . Cough   . Diabetes mellitus     type 2  . GERD (gastroesophageal reflux disease)   . Hypertension   . Bronchitis   . Osteoarthritis   . Dyslipidemia     remote hx/notes 10/06/2009  . ESRD (end stage renal disease) on dialysis [facility](HCC)     Dr Coladonato/Powell  . Depression     lexapro  . Staphylococcus aureus bacteremia with sepsis (HCC)     thought from HD catheter  . Anxiety    Past Surgical History  Procedure Laterality Date  . Eye surgery      Cataract extraction  . Thrombectomy and revision of arterioventous (av) goretex  graft Left 12/2004; 01/2006; 03/12/2009    forearmnotes 10/13/2010; forearmnotes 10/13/2010; upper arm/notes 03/19/2009  . Abdominal hysterectomy      remote hx/notes 10/06/2009  . Vitrectomy Left 03/2001    with membrane peel/notes 10/13/2010  . Arteriovenous graft placement Left 07/2004    forearm/notes 10/13/2010  . Arteriovenous graft placement Left 03/2006    upper armnotes 10/13/2010  . Peripheral vascular catheterization Right 02/18/2015    Procedure: A/V Shuntogram/Fistulagram;  Surgeon: Annice NeedyJason S [name], MD;  Location: [facility] INVASIVE CV LAB;  Service: Cardiovascular;  Laterality: Right;  . Peripheral vascular catheterization N/A 02/18/2015     Procedure: A/V Shunt Intervention;  Surgeon: Annice NeedyJason S [name], MD;  Location: [facility] INVASIVE CV LAB;  Service: Cardiovascular;  Laterality: N/A;  . Peripheral vascular catheterization N/A 03/06/2015    Procedure: Dialysis/Perma Catheter Insertion;  Surgeon: Annice NeedyJason S [name], MD;  Location: [facility] INVASIVE CV LAB;  Service: Cardiovascular;  Laterality: N/A;  . Revision of arteriovenous goretex graft Right 03/08/2015    Procedure: REVISION OF ARTERIOVENOUS GORETEX GRAFT;  Surgeon: Renford DillsGregory G Schnier, MD;  Location: [facility] ORS;  Service: Vascular;  Laterality: Right;   Family History  Problem Relation Age of Onset  . Hypertension Mother    Social History  Substance Use Topics  . Smoking status: Never Smoker   . Smokeless tobacco: Not on file  . Alcohol Use: No   OB History    No data available     Review of Systems  Constitutional: Negative.   HENT: Negative.   Eyes: Negative.   Respiratory: Negative.   Cardiovascular: Negative.   Gastrointestinal: Negative.   Endocrine: Negative.   Genitourinary: Negative.   Skin: Negative.   Allergic/Immunologic: Negative.   Neurological: Negative.   Hematological: Negative.   Psychiatric/Behavioral: Positive for confusion and agitation.    Allergies  Review of patient's allergies indicates no known allergies.  Home Medications   Prior to Admission medications   Medication Sig Start Date End Date Taking? Authorizing Provider  acetaminophen (TYLENOL) 500 MG tablet Take 500 mg by mouth 2 (two) times daily as needed.  Historical Provider, MD  amLODipine (NORVASC) 5 MG tablet Take 5 mg by mouth daily. Take half tablet on Sundays mondays wednesdays and fridays 04/05/12   Eulis Foster, FNP  aspirin 81 MG tablet Take 81 mg by mouth daily.    Historical Provider, MD  cephALEXin (KEFLEX) 500 MG capsule Take 1 capsule (500 mg total) by mouth 2 (two) times daily. Patient not taking: Reported on 02/18/2015 02/08/15   Tomasita Crumble, MD  cinacalcet (SENSIPAR) 30 MG  tablet Take 30 mg by mouth daily.    Historical Provider, MD  Dorzolamide HCl-Timolol Mal (COSOPT OP) Place 1 drop into both eyes daily.     Historical Provider, MD  escitalopram (LEXAPRO) 10 MG tablet Take 0.5 tablets (5 mg total) by mouth daily. 03/15/15   Eustaquio Boyden, MD  famotidine (PEPCID) 20 MG tablet Take 1 tablet (20 mg total) by mouth 2 (two) times daily. Patient taking differently: Take 20 mg by mouth daily.  11/20/14   Calvert Cantor, MD  folic acid-vitamin b complex-vitamin c-selenium-zinc (DIALYVITE) 3 MG TABS Take 1 tablet by mouth daily.      Historical Provider, MD  HYDROcodone-acetaminophen (NORCO) 5-325 MG tablet Take 1-2 tablets by mouth every 6 (six) hours as needed for moderate pain. 03/08/15   Renford Dills, MD  loperamide (IMODIUM) 2 MG capsule Take 1 capsule (2 mg total) by mouth as needed for diarrhea or loose stools. 07/28/13   Maryann Mikhail, DO  multivitamin (RENA-VIT) TABS tablet Take 1 tablet by mouth at bedtime. 02/01/15   Eustaquio Boyden, MD  promethazine (PHENERGAN) 25 MG tablet Take 12.5 mg by mouth every 6 (six) hours as needed for nausea or vomiting.    Historical Provider, MD  quiNINE (QUALAQUIN) 324 MG capsule Take 324 mg by mouth 3 (three) times a week. Take on tuesdays thursdays and saturdays    Historical Provider, MD  simvastatin (ZOCOR) 40 MG tablet Take 40 mg by mouth daily.    Historical Provider, MD   Meds Ordered and Administered this Visit  Medications - No data to display  There were no vitals taken for this visit. No data found.   Physical Exam  Constitutional:  Confused elderly female in [facility]  HENT:  Head: Normocephalic and atraumatic.  Eyes: Conjunctivae and EOM are normal. Pupils are equal, round, and reactive to light.  Neck: Normal range of motion. Neck supple.  Cardiovascular: Normal rate, regular rhythm and normal heart sounds.   Pulmonary/Chest: Effort normal and breath sounds normal.  Abdominal: Soft. Bowel sounds are normal.   Neurological:  Confused and agitated.    ED Course  Procedures (including critical care time)  Labs Review Labs Reviewed - No data to display  Imaging Review No results found.   Visual Acuity Review  Right Eye Distance:   Left Eye Distance:   Bilateral Distance:    Right Eye Near:   Left Eye Near:    Bilateral Near:         MDM  Altered Mental status - Recommend transfer to ED for evaluation for confusion and agitation state.  Anselm Pancoast Georgios Kina FNP    Deatra Canter, FNP 04/08/15 [phone]

## 2015-04-08 NOTE — Telephone Encounter (Signed)
[facility]. She called and talked to Meadowdalearrie before I got this message and said she thought her mom had a UTI and didn't want her to have to go to the ER. She said she required a urinary cath to check her urine, so she just wanted an abx without being seen since we couldn't do that here. I advised that we could and asked that she schedule her an appt with either Jae DireKate or Nicki Reaperegina Baity or have Gina insert the cath with any other provider if UA was needed. [facility] and no appt was scheduled here, so she must've opted for that route.

## 2015-04-08 NOTE — ED Notes (Signed)
Pt being transferred to ED for further workup for possible UTI.  Report was called to First RN, Delight HohMarcella.  Pt was taken to ED by her daughter-in-law.

## 2015-04-09 ENCOUNTER — Encounter (HOSPITAL_COMMUNITY): Payer: Self-pay | Admitting: Emergency Medicine

## 2015-04-09 DIAGNOSIS — M1990 Unspecified osteoarthritis, unspecified site: Secondary | ICD-10-CM | POA: Diagnosis present

## 2015-04-09 DIAGNOSIS — I953 Hypotension of hemodialysis: Secondary | ICD-10-CM | POA: Diagnosis not present

## 2015-04-09 DIAGNOSIS — E8809 Other disorders of plasma-protein metabolism, not elsewhere classified: Secondary | ICD-10-CM

## 2015-04-09 DIAGNOSIS — I871 Compression of vein: Secondary | ICD-10-CM | POA: Diagnosis present

## 2015-04-09 DIAGNOSIS — T82898A Other specified complication of vascular prosthetic devices, implants and grafts, initial encounter: Secondary | ICD-10-CM | POA: Diagnosis not present

## 2015-04-09 DIAGNOSIS — Z8249 Family history of ischemic heart disease and other diseases of the circulatory system: Secondary | ICD-10-CM | POA: Diagnosis not present

## 2015-04-09 DIAGNOSIS — E875 Hyperkalemia: Secondary | ICD-10-CM | POA: Diagnosis present

## 2015-04-09 DIAGNOSIS — A419 Sepsis, unspecified organism: Secondary | ICD-10-CM | POA: Diagnosis not present

## 2015-04-09 DIAGNOSIS — Y832 Surgical operation with anastomosis, bypass or graft as the cause of abnormal reaction of the patient, or of later complication, without mention of misadventure at the time of the procedure: Secondary | ICD-10-CM | POA: Diagnosis present

## 2015-04-09 DIAGNOSIS — Z452 Encounter for adjustment and management of vascular access device: Secondary | ICD-10-CM | POA: Diagnosis not present

## 2015-04-09 DIAGNOSIS — N390 Urinary tract infection, site not specified: Secondary | ICD-10-CM | POA: Diagnosis not present

## 2015-04-09 DIAGNOSIS — F0390 Unspecified dementia without behavioral disturbance: Secondary | ICD-10-CM | POA: Diagnosis present

## 2015-04-09 DIAGNOSIS — T827XXA Infection and inflammatory reaction due to other cardiac and vascular devices, implants and grafts, initial encounter: Secondary | ICD-10-CM | POA: Diagnosis not present

## 2015-04-09 DIAGNOSIS — I120 Hypertensive chronic kidney disease with stage 5 chronic kidney disease or end stage renal disease: Secondary | ICD-10-CM | POA: Diagnosis present

## 2015-04-09 DIAGNOSIS — D631 Anemia in chronic kidney disease: Secondary | ICD-10-CM | POA: Diagnosis not present

## 2015-04-09 DIAGNOSIS — Y846 Urinary catheterization as the cause of abnormal reaction of the patient, or of later complication, without mention of misadventure at the time of the procedure: Secondary | ICD-10-CM | POA: Diagnosis present

## 2015-04-09 DIAGNOSIS — D509 Iron deficiency anemia, unspecified: Secondary | ICD-10-CM

## 2015-04-09 DIAGNOSIS — N2581 Secondary hyperparathyroidism of renal origin: Secondary | ICD-10-CM | POA: Diagnosis present

## 2015-04-09 DIAGNOSIS — N186 End stage renal disease: Secondary | ICD-10-CM | POA: Diagnosis not present

## 2015-04-09 DIAGNOSIS — F329 Major depressive disorder, single episode, unspecified: Secondary | ICD-10-CM | POA: Diagnosis present

## 2015-04-09 DIAGNOSIS — F419 Anxiety disorder, unspecified: Secondary | ICD-10-CM | POA: Diagnosis present

## 2015-04-09 DIAGNOSIS — E1122 Type 2 diabetes mellitus with diabetic chronic kidney disease: Secondary | ICD-10-CM | POA: Diagnosis present

## 2015-04-09 DIAGNOSIS — I96 Gangrene, not elsewhere classified: Secondary | ICD-10-CM | POA: Diagnosis not present

## 2015-04-09 DIAGNOSIS — Z992 Dependence on renal dialysis: Secondary | ICD-10-CM | POA: Diagnosis not present

## 2015-04-09 DIAGNOSIS — Z87440 Personal history of urinary (tract) infections: Secondary | ICD-10-CM | POA: Diagnosis not present

## 2015-04-09 DIAGNOSIS — A4151 Sepsis due to Escherichia coli [E. coli]: Secondary | ICD-10-CM | POA: Diagnosis not present

## 2015-04-09 DIAGNOSIS — Z7982 Long term (current) use of aspirin: Secondary | ICD-10-CM | POA: Diagnosis not present

## 2015-04-09 DIAGNOSIS — D62 Acute posthemorrhagic anemia: Secondary | ICD-10-CM | POA: Diagnosis not present

## 2015-04-09 DIAGNOSIS — D696 Thrombocytopenia, unspecified: Secondary | ICD-10-CM | POA: Diagnosis not present

## 2015-04-09 DIAGNOSIS — K219 Gastro-esophageal reflux disease without esophagitis: Secondary | ICD-10-CM | POA: Diagnosis not present

## 2015-04-09 DIAGNOSIS — E1121 Type 2 diabetes mellitus with diabetic nephropathy: Secondary | ICD-10-CM | POA: Diagnosis not present

## 2015-04-09 DIAGNOSIS — G9340 Encephalopathy, unspecified: Secondary | ICD-10-CM | POA: Diagnosis not present

## 2015-04-09 DIAGNOSIS — T83518A Infection and inflammatory reaction due to other urinary catheter, initial encounter: Secondary | ICD-10-CM | POA: Diagnosis present

## 2015-04-09 DIAGNOSIS — R4182 Altered mental status, unspecified: Secondary | ICD-10-CM | POA: Diagnosis not present

## 2015-04-09 DIAGNOSIS — T827XXD Infection and inflammatory reaction due to other cardiac and vascular devices, implants and grafts, subsequent encounter: Secondary | ICD-10-CM | POA: Diagnosis not present

## 2015-04-09 DIAGNOSIS — L989 Disorder of the skin and subcutaneous tissue, unspecified: Secondary | ICD-10-CM | POA: Diagnosis present

## 2015-04-09 DIAGNOSIS — E785 Hyperlipidemia, unspecified: Secondary | ICD-10-CM | POA: Diagnosis not present

## 2015-04-09 LAB — URINALYSIS, ROUTINE W REFLEX MICROSCOPIC
BILIRUBIN URINE: NEGATIVE
GLUCOSE, UA: NEGATIVE mg/dL
Ketones, ur: NEGATIVE mg/dL
Nitrite: NEGATIVE
Protein, ur: >300 mg/dL — AB
Specific Gravity, Urine: 1.018 (ref 1.005–1.030)
Urobilinogen, UA: 0.2 mg/dL (ref 0.0–1.0)
pH: 8 (ref 5.0–8.0)

## 2015-04-09 LAB — RENAL FUNCTION PANEL
Albumin: 3 g/dL — ABNORMAL LOW (ref 3.5–5.0)
Anion gap: 12 (ref 5–15)
BUN: 46 mg/dL — ABNORMAL HIGH (ref 6–20)
CO2: 27 mmol/L (ref 22–32)
Calcium: 9.2 mg/dL (ref 8.9–10.3)
Chloride: 100 mmol/L — ABNORMAL LOW (ref 101–111)
Creatinine, Ser: 9.96 mg/dL — ABNORMAL HIGH (ref 0.44–1.00)
GFR calc Af Amer: 4 mL/min — ABNORMAL LOW (ref >60)
GFR calc non Af Amer: 3 mL/min — ABNORMAL LOW (ref >60)
Glucose, Bld: 103 mg/dL — ABNORMAL HIGH (ref 65–99)
Phosphorus: 6.2 mg/dL — ABNORMAL HIGH (ref 2.5–4.6)
Potassium: >7.5 mmol/L (ref 3.5–5.1)
Sodium: 139 mmol/L (ref 135–145)

## 2015-04-09 LAB — URINE MICROSCOPIC-ADD ON

## 2015-04-09 LAB — CBC
HCT: 34.2 % — ABNORMAL LOW (ref 36.0–46.0)
Hemoglobin: 11 g/dL — ABNORMAL LOW (ref 12.0–15.0)
MCH: 28.6 pg (ref 26.0–34.0)
MCHC: 32.2 g/dL (ref 30.0–36.0)
MCV: 89.1 fL (ref 78.0–100.0)
Platelets: 121 10*3/uL — ABNORMAL LOW (ref 150–400)
RBC: 3.84 MIL/uL — ABNORMAL LOW (ref 3.87–5.11)
RDW: 18.8 % — ABNORMAL HIGH (ref 11.5–15.5)
WBC: 5.1 10*3/uL (ref 4.0–10.5)

## 2015-04-09 LAB — GLUCOSE, CAPILLARY: Glucose-Capillary: 114 mg/dL — ABNORMAL HIGH (ref 65–99)

## 2015-04-09 MED ORDER — DEXTROSE 5 % IV SOLN
1.0000 g | Freq: Once | INTRAVENOUS | Status: AC
  Administered 2015-04-09: 1 g via INTRAVENOUS
  Filled 2015-04-09: qty 10

## 2015-04-09 MED ORDER — AMLODIPINE BESYLATE 5 MG PO TABS
2.5000 mg | ORAL_TABLET | ORAL | Status: DC
  Administered 2015-04-12 – 2015-04-15 (×3): 2.5 mg via ORAL
  Filled 2015-04-09 (×3): qty 1

## 2015-04-09 MED ORDER — QUININE SULFATE 324 MG PO CAPS
324.0000 mg | ORAL_CAPSULE | ORAL | Status: DC
  Filled 2015-04-09 (×3): qty 1

## 2015-04-09 MED ORDER — HEPARIN SODIUM (PORCINE) 5000 UNIT/ML IJ SOLN
5000.0000 [IU] | Freq: Three times a day (TID) | INTRAMUSCULAR | Status: DC
  Administered 2015-04-09 – 2015-04-11 (×5): 5000 [IU] via SUBCUTANEOUS
  Filled 2015-04-09 (×9): qty 1

## 2015-04-09 MED ORDER — DIALYVITE 3000 3 MG PO TABS: 1.0000 | ORAL_TABLET | Freq: Every day | ORAL | Status: DC

## 2015-04-09 MED ORDER — SODIUM CHLORIDE 0.9 % IV SOLN: 100.0000 mL | INTRAVENOUS | Status: DC | PRN

## 2015-04-09 MED ORDER — FAMOTIDINE 20 MG PO TABS
20.0000 mg | ORAL_TABLET | Freq: Every day | ORAL | Status: DC
  Administered 2015-04-09 – 2015-04-15 (×6): 20 mg via ORAL
  Filled 2015-04-09 (×7): qty 1

## 2015-04-09 MED ORDER — SIMVASTATIN 40 MG PO TABS
40.0000 mg | ORAL_TABLET | Freq: Every day | ORAL | Status: DC
  Administered 2015-04-09: 40 mg via ORAL
  Filled 2015-04-09 (×2): qty 1

## 2015-04-09 MED ORDER — ESCITALOPRAM OXALATE 10 MG PO TABS
5.0000 mg | ORAL_TABLET | Freq: Every day | ORAL | Status: DC
  Administered 2015-04-09 – 2015-04-15 (×5): 5 mg via ORAL
  Filled 2015-04-09 (×7): qty 1

## 2015-04-09 MED ORDER — HALOPERIDOL LACTATE 5 MG/ML IJ SOLN
0.5000 mg | Freq: Once | INTRAMUSCULAR | Status: AC
  Administered 2015-04-09 (×2): 0.5 mg via INTRAVENOUS
  Filled 2015-04-09: qty 1

## 2015-04-09 MED ORDER — HEPARIN SODIUM (PORCINE) 1000 UNIT/ML DIALYSIS: 20.0000 [IU]/kg | INTRAMUSCULAR | Status: DC | PRN

## 2015-04-09 MED ORDER — AMLODIPINE BESYLATE 5 MG PO TABS: 5.0000 mg | ORAL_TABLET | Freq: Every day | ORAL | Status: DC

## 2015-04-09 MED ORDER — HEPARIN SODIUM (PORCINE) 1000 UNIT/ML DIALYSIS: 1000.0000 [IU] | INTRAMUSCULAR | Status: DC | PRN

## 2015-04-09 MED ORDER — PENTAFLUOROPROP-TETRAFLUOROETH EX AERO: 1.0000 "application " | INHALATION_SPRAY | CUTANEOUS | Status: DC | PRN

## 2015-04-09 MED ORDER — HYDROCODONE-ACETAMINOPHEN 5-325 MG PO TABS: 1.0000 | ORAL_TABLET | Freq: Four times a day (QID) | ORAL | Status: DC | PRN

## 2015-04-09 MED ORDER — ASPIRIN 81 MG PO CHEW
81.0000 mg | CHEWABLE_TABLET | Freq: Every day | ORAL | Status: DC
  Administered 2015-04-09 – 2015-04-15 (×6): 81 mg via ORAL
  Filled 2015-04-09 (×6): qty 1

## 2015-04-09 MED ORDER — PROMETHAZINE HCL 25 MG PO TABS: 12.5000 mg | ORAL_TABLET | Freq: Four times a day (QID) | ORAL | Status: DC | PRN

## 2015-04-09 MED ORDER — CINACALCET HCL 30 MG PO TABS
30.0000 mg | ORAL_TABLET | Freq: Every day | ORAL | Status: DC
  Administered 2015-04-09 – 2015-04-15 (×6): 30 mg via ORAL
  Filled 2015-04-09 (×9): qty 1

## 2015-04-09 MED ORDER — DORZOLAMIDE HCL-TIMOLOL MAL 2-0.5 % OP SOLN
1.0000 [drp] | Freq: Every day | OPHTHALMIC | Status: DC
  Administered 2015-04-13 – 2015-04-15 (×3): 1 [drp] via OPHTHALMIC
  Filled 2015-04-09 (×2): qty 10

## 2015-04-09 MED ORDER — LOPERAMIDE HCL 2 MG PO CAPS: 2.0000 mg | ORAL_CAPSULE | ORAL | Status: DC | PRN

## 2015-04-09 MED ORDER — ONDANSETRON HCL 4 MG/2ML IJ SOLN: 4.0000 mg | Freq: Four times a day (QID) | INTRAMUSCULAR | Status: DC | PRN

## 2015-04-09 MED ORDER — RENA-VITE PO TABS
1.0000 | ORAL_TABLET | Freq: Every day | ORAL | Status: DC
  Administered 2015-04-09 – 2015-04-14 (×5): 1 via ORAL
  Filled 2015-04-09 (×7): qty 1

## 2015-04-09 MED ORDER — LIDOCAINE HCL (PF) 1 % IJ SOLN: 5.0000 mL | INTRAMUSCULAR | Status: DC | PRN

## 2015-04-09 MED ORDER — AMLODIPINE BESYLATE 5 MG PO TABS
5.0000 mg | ORAL_TABLET | ORAL | Status: DC
  Administered 2015-04-09 – 2015-04-13 (×3): 5 mg via ORAL
  Filled 2015-04-09 (×4): qty 1

## 2015-04-09 MED ORDER — ONDANSETRON HCL 4 MG PO TABS: 4.0000 mg | ORAL_TABLET | Freq: Four times a day (QID) | ORAL | Status: DC | PRN

## 2015-04-09 MED ORDER — ACETAMINOPHEN 500 MG PO TABS
500.0000 mg | ORAL_TABLET | Freq: Four times a day (QID) | ORAL | Status: DC | PRN
  Administered 2015-04-11: 500 mg via ORAL
  Filled 2015-04-09: qty 1

## 2015-04-09 MED ORDER — HALOPERIDOL 0.5 MG PO TABS
0.5000 mg | ORAL_TABLET | Freq: Once | ORAL | Status: DC
  Filled 2015-04-09: qty 1

## 2015-04-09 MED ORDER — SODIUM CHLORIDE 0.9 % IV SOLN
125.0000 mg | INTRAVENOUS | Status: DC
  Administered 2015-04-09 – 2015-04-13 (×3): 125 mg via INTRAVENOUS
  Filled 2015-04-09 (×5): qty 10

## 2015-04-09 MED ORDER — DOXERCALCIFEROL 4 MCG/2ML IV SOLN
INTRAVENOUS | Status: AC
  Filled 2015-04-09: qty 2

## 2015-04-09 MED ORDER — HYDROCODONE-ACETAMINOPHEN 5-325 MG PO TABS
1.0000 | ORAL_TABLET | Freq: Four times a day (QID) | ORAL | Status: DC | PRN
  Administered 2015-04-13: 1 via ORAL

## 2015-04-09 MED ORDER — ALTEPLASE 2 MG IJ SOLR
2.0000 mg | Freq: Once | INTRAMUSCULAR | Status: DC | PRN
  Filled 2015-04-09: qty 2

## 2015-04-09 MED ORDER — ACETAMINOPHEN 500 MG PO TABS: 500.0000 mg | ORAL_TABLET | Freq: Four times a day (QID) | ORAL | Status: DC | PRN

## 2015-04-09 MED ORDER — SEVELAMER CARBONATE 800 MG PO TABS
1600.0000 mg | ORAL_TABLET | Freq: Three times a day (TID) | ORAL | Status: DC
  Administered 2015-04-09 – 2015-04-15 (×12): 1600 mg via ORAL
  Filled 2015-04-09 (×13): qty 2

## 2015-04-09 MED ORDER — LIDOCAINE-PRILOCAINE 2.5-2.5 % EX CREA
1.0000 "application " | TOPICAL_CREAM | CUTANEOUS | Status: DC | PRN
  Filled 2015-04-09: qty 5

## 2015-04-09 MED ORDER — ALTEPLASE 100 MG IV SOLR
2.0000 mg | Freq: Once | INTRAVENOUS | Status: AC
  Administered 2015-04-09: 2 mg
  Filled 2015-04-09: qty 2

## 2015-04-09 MED ORDER — DOXERCALCIFEROL 4 MCG/2ML IV SOLN
3.0000 ug | INTRAVENOUS | Status: DC
  Administered 2015-04-09 – 2015-04-13 (×3): 3 ug via INTRAVENOUS
  Filled 2015-04-09 (×3): qty 2

## 2015-04-09 MED ORDER — DEXTROSE 5 % IV SOLN
2.0000 g | INTRAVENOUS | Status: DC
  Administered 2015-04-09 – 2015-04-12 (×4): 2 g via INTRAVENOUS
  Filled 2015-04-09 (×6): qty 2

## 2015-04-09 NOTE — Progress Notes (Signed)
Hemodialysis=pt has been on unit for 2 hours. Unable to initiate dialysis d/t agitation. Attempted 0.5mg  po haldol per order however pt refusing. Daughter at bedside for support. Order changed to 0.5mg  haldol iv; Per PA give dose then wait 30 minutes and if needed give additional 0.5mg  dose. After 30 minutes pt would not allow bp to be taken, given additional 0.5mg  dose haldol iv. Dr. Allena KatzPatel updated. Continue to monitor. Daughter remains at bedside.

## 2015-04-09 NOTE — Consult Note (Signed)
[facility] [place] CONSULT NOTE   MRN : [id number]  Reason for Consult: wound over right AV graft site " exposed struts of a previously deployed stent in the graft portion of the HeRO which indicates removal of the entire hero graft/catheter portion of the device as it is theoretically all infected. " Referring Physician: Elease EtienneAnand D Hongalgi, MD  History of Present Illness: [age] y/o female with ESRD.  She was admitted secondary to mentle status changes in the presence of a UTI.  More recently she had + cultures from arm AVGG 9/27 proteus mirabilis and 10/20 enterobacter cloace treated with Elita QuickFortaz x 2 weeks - the last dose was 11/5 . She has been followed by [facility]. Her last procedure on her right brachial HERO graft was 10/7 by Dr. Lorretta HarpSchneir when an ulcerated section occurred at the site of a stent in the graft.  Revision of the PTFE portion and new graft was placed lateral to the ulcerated section.  She was seen in his office last week and started on Silvadene cream.  Of note was she just completed 2 weeks of Fortaz 11/5. Evaluation in the ED showed WBC 6.2 no diff, U/A turbid many bacteria, 21-50 RBC, neg nitrite, and CXR neg.  She does have a right HD catheter in place.     Other past medical history includes: hypertension managed with Norvasc dislipidemia managed with Zocor, and  DM.   She is not on any anticoagulants or antiplatelets other than 81 mg aspirin daily.   Current Facility-Administered Medications  Medication Dose Route Frequency Provider Last Rate Last Dose  . acetaminophen (TYLENOL) tablet 500 mg  500 mg Oral Q6H PRN Elease EtienneAnand D Hongalgi, MD      . Melene Muller[START ON 04/10/2015] amLODipine (NORVASC) tablet 2.5 mg  2.5 mg Oral Once per day on Sun Mon Wed Fri Elease EtienneAnand D Hongalgi, MD      . amLODipine (NORVASC) tablet 5 mg  5 mg Oral Once per day on Tue Thu Sat Elease EtienneAnand D Hongalgi, MD      . aspirin chewable tablet 81 mg  81 mg Oral Daily Rondell Burtis JunesA Smith, MD      . cinacalcet (SENSIPAR)  tablet 30 mg  30 mg Oral Q breakfast Rondell A Katrinka BlazingSmith, MD      . dorzolamide-timolol (COSOPT) 22.3-6.8 MG/ML ophthalmic solution 1 drop  1 drop Both Eyes Daily Clydie Braunondell A Smith, MD   1 drop at 04/09/15 1015  . doxercalciferol (HECTOROL) injection 3 mcg  3 mcg Intravenous Q T,Th,Sa-HD Weston SettleMartha Bergman, PA-C      . escitalopram (LEXAPRO) tablet 5 mg  5 mg Oral Daily Rondell A Katrinka BlazingSmith, MD      . famotidine (PEPCID) tablet 20 mg  20 mg Oral Daily Rondell A Katrinka BlazingSmith, MD      . ferric gluconate (NULECIT) 125 mg in sodium chloride 0.9 % 100 mL IVPB  125 mg Intravenous Q T,Th,Sa-HD Weston SettleMartha Bergman, PA-C      . heparin injection 5,000 Units  5,000 Units Subcutaneous 3 times per day Clydie Braunondell A Smith, MD   5,000 Units at 04/09/15 0700  . HYDROcodone-acetaminophen (NORCO/VICODIN) 5-325 MG per tablet 1 tablet  1 tablet Oral Q6H PRN Elease EtienneAnand D Hongalgi, MD      . loperamide (IMODIUM) capsule 2 mg  2 mg Oral PRN Clydie Braunondell A Smith, MD      . multivitamin (RENA-VIT) tablet 1 tablet  1 tablet Oral QHS Clydie Braunondell A Smith, MD      .  ondansetron (ZOFRAN) tablet 4 mg  4 mg Oral Q6H PRN Clydie Braun, MD       Or  . ondansetron (ZOFRAN) injection 4 mg  4 mg Intravenous Q6H PRN Clydie Braun, MD      . promethazine (PHENERGAN) tablet 12.5 mg  12.5 mg Oral Q6H PRN Clydie Braun, MD      . quiNINE Janith Lima) capsule 324 mg  324 mg Oral Once per day on Tue Thu Sat Clydie Braun, MD      . sevelamer carbonate (RENVELA) tablet 1,600 mg  1,600 mg Oral TID WC Weston Settle, PA-C   1,600 mg at 04/09/15 1200  . simvastatin (ZOCOR) tablet 40 mg  40 mg Oral q1800 Rondell Burtis Junes, MD        Pt meds include: Statin :Yes Betablocker: No ASA: Yes Other anticoagulants/antiplatelets:   Past Medical History  Diagnosis Date  . Cough   . Diabetes mellitus     type 2  . GERD (gastroesophageal reflux disease)   . Hypertension   . Bronchitis   . Osteoarthritis   . Dyslipidemia     remote hx/notes 10/06/2009  . ESRD (end stage renal  disease) on dialysis [facility])     Dr Coladonato/Powell  . Depression     lexapro  . Staphylococcus aureus bacteremia with sepsis (HCC)     thought from HD catheter  . Anxiety     Past Surgical History  Procedure Laterality Date  . Eye surgery      Cataract extraction  . Thrombectomy and revision of arterioventous (av) goretex  graft Left 12/2004; 01/2006; 03/12/2009    forearmnotes 10/13/2010; forearmnotes 10/13/2010; upper arm/notes 03/19/2009  . Abdominal hysterectomy      remote hx/notes 10/06/2009  . Vitrectomy Left 03/2001    with membrane peel/notes 10/13/2010  . Arteriovenous graft placement Left 07/2004    forearm/notes 10/13/2010  . Arteriovenous graft placement Left 03/2006    upper armnotes 10/13/2010  . Peripheral vascular catheterization Right 02/18/2015    Procedure: A/V Shuntogram/Fistulagram;  Surgeon: Annice Needy, MD;  Location: [facility] INVASIVE CV LAB;  Service: Cardiovascular;  Laterality: Right;  . Peripheral vascular catheterization N/A 02/18/2015    Procedure: A/V Shunt Intervention;  Surgeon: Annice Needy, MD;  Location: [facility] INVASIVE CV LAB;  Service: Cardiovascular;  Laterality: N/A;  . Peripheral vascular catheterization N/A 03/06/2015    Procedure: Dialysis/Perma Catheter Insertion;  Surgeon: Annice Needy, MD;  Location: [facility] INVASIVE CV LAB;  Service: Cardiovascular;  Laterality: N/A;  . Revision of arteriovenous goretex graft Right 03/08/2015    Procedure: REVISION OF ARTERIOVENOUS GORETEX GRAFT;  Surgeon: Renford Dills, MD;  Location: [facility] ORS;  Service: Vascular;  Laterality: Right;    Social History Social History  Substance Use Topics  . Smoking status: Never Smoker   . Smokeless tobacco: None  . Alcohol Use: No    Family History Family History  Problem Relation Age of Onset  . Hypertension Mother     No Known Allergies   REVIEW OF SYSTEMS  General:  Weight loss,  Fever,  chills Neurologic:  Dizziness,  Blackouts,  Seizure   Stroke,  "Mini stroke",  Slurred speech,  Temporary blindness;  weakness in arms or legs,  Hoarseness  Dysphagia Cardiac:  Chest pain/pressure,  Shortness of breath at rest  Shortness of breath with exertion,  Atrial fibrillation or  irregular heartbeat  Vascular: [ ]  Pain in legs with walking, [ ]  Pain in legs at rest, [ ]  Pain in legs at night,  [ ]  Non-healing ulcer, [ ]  Blood clot in vein/DVT,   Pulmonary: [ ]  Home oxygen, [ ]  Productive cough, [ ]  Coughing up blood, [ ]  Asthma,  [ ]  Wheezing [ ]  COPD Musculoskeletal:  [ ]  Arthritis, [ ]  Low back pain, [ ]  Joint pain Hematologic: [ ]  Easy Bruising, [ ]  Anemia; [ ]  Hepatitis Gastrointestinal: [ ]  Blood in stool, [ ]  Gastroesophageal Reflux/heartburn, Urinary: [ ]  chronic Kidney disease, [ ]  on HD - [ ]  MWF or [ ]  TTHS, [ ]  Burning with urination, [ ]  Difficulty urinating Skin: [ ]  Rashes, [ ]  Wounds Psychological: [ ]  Anxiety, [ ]  Depression  Physical Examination Filed Vitals:   04/09/15 0045 04/09/15 0330 04/09/15 0519 04/09/15 1034  BP:  102/75 138/66 160/66  Pulse: 79 83 84 93  Temp:   99.9 F (37.7 C) 98.8 F (37.1 C)  TempSrc:   Oral Oral  Resp:   20 18  Height:   4\' 10"  (1.473 m)   Weight:   140 lb (63.504 kg)   SpO2: 97% 100% 100% 93%   Body mass index is 29.27 kg/(m^2).  General:  WDWN in NAD HENT: WNL Eyes: Pupils equal Pulmonary: normal non-labored breathing , without Rales, rhonchi,  wheezing Cardiac: RRR, without  Murmurs, rubs or gallops; No carotid bruits Abdomen: soft, NT, no masses Skin: right medial upper arm ulcer over old graft with visible metal stent.   No frank purulent drainage.  The skin is separated about 1.5 mm.     Vascular Exam/Pulses:Palpable radial pulses bilaterally.  She wears a glove on her right hand and has for years sue to cold sensation.  No sign of true steal.  Grip 5/5 right UE.   Musculoskeletal: no muscle wasting or atrophy; no edema  Neurologic: A&O X  3; Appropriate Affect ;  SENSATION: grossly normal; MOTOR FUNCTION: grossly 5/5 Symmetric Speech is fluent/normal   Significant Diagnostic Studies: CBC Lab Results  Component Value Date   WBC 6.2 04/08/2015   HGB 11.4* 04/08/2015   HCT 36.1 04/08/2015   MCV 90.9 04/08/2015   PLT 121* 04/08/2015    BMET    Component Value Date/Time   NA 138 04/08/2015 1945   K 5.4* 04/08/2015 1945   CL 99* 04/08/2015 1945   CO2 26 04/08/2015 1945   GLUCOSE 93 04/08/2015 1945   GLUCOSE 111 02/07/2009   GLUCOSE 246* 05/28/2006 1229   BUN 41* 04/08/2015 1945   CREATININE 8.72* 04/08/2015 1945   CALCIUM 9.6 04/08/2015 1945   CALCIUM 9.6 07/18/2012 1051   GFRNONAA 4* 04/08/2015 1945   GFRAA 4* 04/08/2015 1945   Estimated Creatinine Clearance: 3.8 mL/min (by C-G formula based on Cr of 8.72).  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 Right thigh HD catheter UTI with acute encephalopathy currently on IV Rocephin  Plan OR tomorrow for removal of medial ligated PTFE section of great.    Clinton Gallant [facility] 04/09/2015 2:17 PM  I agree with the above.  I have seen and evaluated the patient.  She  has a history of a right-sided hero graft.  This was recently revised and now the defunctionalized graft is exposed.  She was admitted with altered mental status and sepsis.  This is a potential source and therefore needs to be removed.  This will be scheduled for tomorrow.  She will be nothing by mouth after midnight.  Durene Cal

## 2015-04-09 NOTE — Progress Notes (Signed)
ANTIBIOTIC CONSULT NOTE - INITIAL  Pharmacy Consult for Rocephin Indication: UTI  No Known Allergies  Patient Measurements:    Vital Signs: Temp: 98.1 F (36.7 C) (11/07 2252) Temp Source: Oral (11/07 2252) BP: 102/75 mmHg (11/08 0330) Pulse Rate: 83 (11/08 0330) Intake/Output from previous day:   Intake/Output from this shift:    Labs:  Recent Labs  04/08/15 1945  WBC 6.2  HGB 11.4*  PLT 121*  CREATININE 8.72*   CrCl cannot be calculated (Unknown ideal weight.). No results for input(s): VANCOTROUGH, VANCOPEAK, VANCORANDOM, GENTTROUGH, GENTPEAK, GENTRANDOM, TOBRATROUGH, TOBRAPEAK, TOBRARND, AMIKACINPEAK, AMIKACINTROU, AMIKACIN in the last 72 hours.   Microbiology: No results found for this or any previous visit (from the past 720 hour(s)).  Medical History: Past Medical History  Diagnosis Date  . Cough   . Diabetes mellitus     type 2  . GERD (gastroesophageal reflux disease)   . Hypertension   . Bronchitis   . Osteoarthritis   . Dyslipidemia     remote hx/notes 10/06/2009  . ESRD (end stage renal disease) on dialysis [facility])     Dr Coladonato/Powell  . Depression     lexapro  . Staphylococcus aureus bacteremia with sepsis (HCC)     thought from HD catheter  . Anxiety     Medications:  Prescriptions prior to admission  Medication Sig Dispense Refill Last Dose  . acetaminophen (TYLENOL) 500 MG tablet Take 500 mg by mouth 2 (two) times daily as needed.   Past Week at Unknown time  . amLODipine (NORVASC) 5 MG tablet Take 5 mg by mouth daily. Take half tablet on /11/2014 at Unknown time  . HYDROcodone-acetaminophen (NORCO) 5-325 MG tablet Take 1-2 tablets by mouth every 6 (six) hours as needed for moderate pain. 50 tablet 0 04/08/2015 at Unknown time  . loperamide (IMODIUM) 2 MG capsule Take 1 capsule (2 mg total) by mouth as needed for diarrhea or loose stools. 30 capsule 0 Past Week at Unknown time  . multivitamin (RENA-VIT) TABS tablet Take 1 tablet by mouth at bedtime. 90 tablet 0 04/07/2015 at Unknown time  . promethazine (PHENERGAN) 25 MG tablet Take 12.5 mg by mouth every 6 (six) hours as needed for nausea or vomiting.   Past Week at Unknown time  . quiNINE (QUALAQUIN) 324 MG capsule Take 324 mg by mouth 3 (three) times a week. Take on tuesdays thursdays and saturdays   Past Week at Unknown time  . simvastatin (ZOCOR) 40 MG tablet Take 40 mg by mouth daily.   04/08/2015 at Unknown time  . cephALEXin (KEFLEX) 500 MG capsule Take 1 capsule (500  mg total) by mouth 2 (two) times daily. (Patient not taking: Reported on 02/18/2015) 10 capsule 0 Completed Course at Unknown time   Assessment: 81 y.o. F presents with AMS. Received Rocephin 1gm IV ~0300 in ED. To continue Rocephin for UTI.  Goal of Therapy:  Resolution of infection  Plan:  Rocephin 1gm IV q24h Pharmacy will sign off - please reconsult if needed.  Christoper Fabianaron Demetrius Mahler, PharmD, BCPS Clinical pharmacist, pager [phone] 04/09/2015,5:19 AM

## 2015-04-09 NOTE — Progress Notes (Signed)
Hemodialysis-Dr. Lowell GuitarPowell notified of groin wound. Will have Renal examine tomorrow. No further orders received. Reported off to primary RN.

## 2015-04-09 NOTE — Consult Note (Signed)
[name] Renal Consultation Note    Indication for Consultation:  Management of ESRD/hemodialysis; anemia, hypertension/volume and secondary hyperparathyroidism PCP: Eustaquio BoydenJavier Gutierrez, MD  HPI: [name] is a 81 y.o. female with ESRD secondary to DM/HTN on HD since March 2006 who [facility] with a history of multiple UTIS, multiple recent access related infections.  She had MSSA 08/2014 and 10/2014.  More recently she had + cultures from arm AVGG 9/27 proteus mirabilis and 10/20 enterobacter cloace treated with Elita QuickFortaz x 2 weeks - the last dose was 11/5 .  She has been followed by [facility]. Her last procedure on her right brachial HERO graft was 10/7 by Dr. Lorretta HarpSchneir when an ulcerated section was resected.  She was seen in his office last week and started on Silvadene cream.  She has a follow up appointment this Friday.  Her daughter thinks the access looks worse than lat Friday. She has been dialyzing with a leg graft that functions variably with BFR of 250 - 350 with marginal kinetics (1-1.3 del spKtv).  She was brought to the ED last evening due to AMS which had going on for 1-2 days per family and is often associated with the onset of UTI.  Of note was she just completed 2 weeks of Fortaz 11/5. Evaluation in the ED showed WBC 6.2 no diff, U/A turbid many bacteria, 21-50 RBC, neg nitrite, CXR neg, Head CT no acute change.  She was last dialyzed 11/5 and attends and runs all treatments faithfully. She has had no fever, chills, N, V, D, CP or SOB.   Past Medical History  Diagnosis Date  . Cough   . Diabetes mellitus     type 2  . GERD (gastroesophageal reflux disease)   . Hypertension   . Bronchitis   . Osteoarthritis   . Dyslipidemia     remote hx/notes 10/06/2009  . ESRD (end stage renal disease) on dialysis [facility](HCC)     Dr Coladonato/Powell  . Depression     lexapro  . Staphylococcus aureus bacteremia with sepsis (HCC)     thought from HD catheter  . Anxiety     Past Surgical History  Procedure Laterality Date  . Eye surgery      Cataract extraction  . Thrombectomy and revision of arterioventous (av) goretex  graft Left 12/2004; 01/2006; 03/12/2009    forearmnotes 10/13/2010; forearmnotes 10/13/2010; upper arm/notes 03/19/2009  . Abdominal hysterectomy      remote hx/notes 10/06/2009  . Vitrectomy Left 03/2001    with membrane peel/notes 10/13/2010  . Arteriovenous graft placement Left 07/2004    forearm/notes 10/13/2010  . Arteriovenous graft placement Left 03/2006    upper armnotes 10/13/2010  . Peripheral vascular catheterization Right 02/18/2015    Procedure: A/V Shuntogram/Fistulagram;  Surgeon: Annice NeedyJason S [name], MD;  Location: [facility] INVASIVE CV LAB;  Service: Cardiovascular;  Laterality: Right;  . Peripheral vascular catheterization N/A 02/18/2015    Procedure: A/V Shunt Intervention;  Surgeon: Annice NeedyJason S [name], MD;  Location: [facility] INVASIVE CV LAB;  Service: Cardiovascular;  Laterality: N/A;  . Peripheral vascular catheterization N/A 03/06/2015    Procedure: Dialysis/Perma Catheter Insertion;  Surgeon: Annice NeedyJason S [name], MD;  Location: [facility] INVASIVE CV LAB;  Service: Cardiovascular;  Laterality: N/A;  . Revision of arteriovenous goretex graft Right 03/08/2015    Procedure: REVISION OF ARTERIOVENOUS GORETEX GRAFT;  Surgeon: Renford DillsGregory G Schnier, MD;  Location: [facility] ORS;  Service: Vascular;  Laterality: Right;   Family History  Problem Relation Age  of Onset  . Hypertension Mother    Social History:  reports that she has never smoked. She does not have any smokeless tobacco history on file. She reports that she does not drink alcohol or use illicit drugs. No Known Allergies Prior to Admission medications   Medication Sig Start Date End Date Taking? Authorizing Provider  acetaminophen (TYLENOL) 500 MG tablet Take 500 mg by mouth 2 (two) times daily as needed.   Yes Historical Provider, MD  amLODipine (NORVASC) 5 MG tablet Take 5 mg by mouth daily. Take half tablet on  Sundays mondays wednesdays and fridays 04/05/12  Yes Eulis Foster, FNP  aspirin 81 MG tablet Take 81 mg by mouth daily.   Yes Historical Provider, MD  cinacalcet (SENSIPAR) 30 MG tablet Take 30 mg by mouth daily.   Yes Historical Provider, MD  Dorzolamide HCl-Timolol Mal (COSOPT OP) Place 1 drop into both eyes daily.    Yes Historical Provider, MD  escitalopram (LEXAPRO) 10 MG tablet Take 0.5 tablets (5 mg total) by mouth daily. 03/15/15  Yes Eustaquio Boyden, MD  famotidine (PEPCID) 20 MG tablet Take 1 tablet (20 mg total) by mouth 2 (two) times daily. Patient taking differently: Take 20 mg by mouth daily.  11/20/14  Yes Calvert Cantor, MD  folic acid-vitamin b complex-vitamin c-selenium-zinc (DIALYVITE) 3 MG TABS Take 1 tablet by mouth daily.     Yes Historical Provider, MD  HYDROcodone-acetaminophen (NORCO) 5-325 MG tablet Take 1-2 tablets by mouth every 6 (six) hours as needed for moderate pain. 03/08/15  Yes Renford Dills, MD  loperamide (IMODIUM) 2 MG capsule Take 1 capsule (2 mg total) by mouth as needed for diarrhea or loose stools. 07/28/13  Yes Maryann Mikhail, DO  multivitamin (RENA-VIT) TABS tablet Take 1 tablet by mouth at bedtime. 02/01/15  Yes Eustaquio Boyden, MD  promethazine (PHENERGAN) 25 MG tablet Take 12.5 mg by mouth every 6 (six) hours as needed for nausea or vomiting.   Yes Historical Provider, MD  quiNINE (QUALAQUIN) 324 MG capsule Take 324 mg by mouth 3 (three) times a week. Take on tuesdays thursdays and saturdays   Yes Historical Provider, MD  simvastatin (ZOCOR) 40 MG tablet Take 40 mg by mouth daily.   Yes Historical Provider, MD  cephALEXin (KEFLEX) 500 MG capsule Take 1 capsule (500 mg total) by mouth 2 (two) times daily. Patient not taking: Reported on 02/18/2015 02/08/15   Tomasita Crumble, MD   Current Facility-Administered Medications  Medication Dose Route Frequency Provider Last Rate Last Dose  . acetaminophen (TYLENOL) tablet 500 mg  500 mg Oral Q6H PRN Clydie Braun, MD      . Melene Muller ON 04/10/2015] amLODipine (NORVASC) tablet 2.5 mg  2.5 mg Oral Once per day on Sun Mon Wed Fri Elease Etienne, MD      . amLODipine (NORVASC) tablet 5 mg  5 mg Oral Once per day on Tue Thu Sat Elease Etienne, MD      . aspirin chewable tablet 81 mg  81 mg Oral Daily Rondell Burtis Junes, MD      . cinacalcet (SENSIPAR) tablet 30 mg  30 mg Oral Q breakfast Rondell A Katrinka Blazing, MD      . dorzolamide-timolol (COSOPT) 22.3-6.8 MG/ML ophthalmic solution 1 drop  1 drop Both Eyes Daily Rondell A Katrinka Blazing, MD      . doxercalciferol (HECTOROL) injection 3 mcg  3 mcg Intravenous Q T,Th,Sa-HD Weston Settle, PA-C      . escitalopram (  LEXAPRO) tablet 5 mg  5 mg Oral Daily Rondell A Katrinka Blazing, MD      . famotidine (PEPCID) tablet 20 mg  20 mg Oral Daily Rondell A Katrinka Blazing, MD      . ferric gluconate (NULECIT) 125 mg in sodium chloride 0.9 % 100 mL IVPB  125 mg Intravenous Q T,Th,Sa-HD Weston Settle, PA-C      . heparin injection 5,000 Units  5,000 Units Subcutaneous 3 times per day Clydie Braun, MD   5,000 Units at 04/09/15 0700  . HYDROcodone-acetaminophen (NORCO/VICODIN) 5-325 MG per tablet 1 tablet  1 tablet Oral Q6H PRN Clydie Braun, MD      . loperamide (IMODIUM) capsule 2 mg  2 mg Oral PRN Clydie Braun, MD      . multivitamin (RENA-VIT) tablet 1 tablet  1 tablet Oral QHS Rondell A Smith, MD      . ondansetron (ZOFRAN) tablet 4 mg  4 mg Oral Q6H PRN Clydie Braun, MD       Or  . ondansetron (ZOFRAN) injection 4 mg  4 mg Intravenous Q6H PRN Clydie Braun, MD      . promethazine (PHENERGAN) tablet 12.5 mg  12.5 mg Oral Q6H PRN Clydie Braun, MD      . quiNINE (QUALAQUIN) capsule 324 mg  324 mg Oral Once per day on Tue Thu Sat Clydie Braun, MD      . simvastatin (ZOCOR) tablet 40 mg  40 mg Oral q1800 Clydie Braun, MD       Labs: Basic Metabolic Panel:  Recent Labs Lab 04/08/15 1945  NA 138  K 5.4*  CL 99*  CO2 26  GLUCOSE 93  BUN 41*  CREATININE 8.72*  CALCIUM 9.6    Liver Function Tests:  Recent Labs Lab 04/08/15 1945  AST 21  ALT 10*  ALKPHOS 83  BILITOT 1.0  PROT 7.9  ALBUMIN 3.1*   CBC:  Recent Labs Lab 04/08/15 1945  WBC 6.2  HGB 11.4*  HCT 36.1  MCV 90.9  PLT 121*   CBG:  Recent Labs Lab 04/08/15 2128 04/08/15 2238  GLUCAP 84 86  Studies/Results: Dg Chest 2 View  04/08/2015  CLINICAL DATA:  Altered mental status. EXAM: CHEST  2 VIEW COMPARISON:  11/16/2014 FINDINGS: Unchanged large-bore right central venous catheter, tip at the atrial caval junction. Cardiomegaly is unchanged. No consolidation, pleural effusion or pneumothorax. No pulmonary edema. Vascular stent seen in the right arm. The bones are under mineralized. IMPRESSION: Stable cardiomegaly.  No acute pulmonary process. Electronically Signed   By: Rubye Oaks M.D.   On: 04/08/2015 23:46   Ct Head Wo Contrast  04/08/2015  CLINICAL DATA:  Confusion and disorientation today. EXAM: CT HEAD WITHOUT CONTRAST TECHNIQUE: Contiguous axial images were obtained from the base of the skull through the vertex without intravenous contrast. COMPARISON:  11/16/2014 FINDINGS: Stable age related cerebral atrophy, ventriculomegaly and periventricular white matter disease. No extra-axial fluid collections are identified. No CT findings for acute hemispheric infarction or intracranial hemorrhage. No mass lesions. The brainstem and cerebellum are normal. Stable calcified right orbital mass. No skull fracture or bone lesion. The paranasal sinuses are grossly clear. Mild Stable left-sided ethmoid disease. Remote blow in type fracture of the right orbit is noted. IMPRESSION: 1. Stable age related cerebral atrophy, ventriculomegaly and periventricular white matter disease. No acute intracranial findings. 2. Stable calcified right orbital mass. 3. No acute bony findings. Electronically Signed   By: Demetrius Charity.  Gallerani M.D.   On: 04/08/2015 23:41   ROS: As per HPI otherwise negative.  Physical  Exam: Filed Vitals:   04/09/15 0030 04/09/15 0045 04/09/15 0330 04/09/15 0519  BP:   102/75 138/66  Pulse: 80 79 83 84  Temp:    99.9 F (37.7 C)  TempSrc:    Oral  Resp:    20  Height:     (1.473 m)  Weight:    63.504 kg (140 lb)  SpO2: 98% 97% 100% 100%     General: Well developed, fairly well nourished elderly AAF, in no acute distress. Head: Normocephalic, atraumatic, sclera non-icteric, mucus membranes are moist Neck: Supple. JVD not elevated. Lungs: Clear bilaterally to auscultation without wheezes, rales, or rhonchi. Breathing is unlabored. Heart: RRR with S1 S2.  Abdomen: Soft, non-tender, non-distended with normoactive bowel sounds.  Lower extremities: without edema or ischemic changes, no open wounds  Neuro: Alert, recognizes my face but cannot recall my name, only oriented to self - not at baseline Psych:  Responds to questions appropriately with a normal pleasant affect but not oriented (though was told she was quite agitated earlier- calmer with daughter at bedside) Dialysis Access: right upper HERO - acutally looks worse than when I saw it before revision; purulent drainage at proximal end; right leg TDC - cath exit clean   Dialysis Orders: Center: [facility] EDW 58.5 -gets +/- EDW 3.75 hr 160 350/A 1.5 2 K 2.25 Ca profile 2 right upper HERO and right leg TDC heparin 3500 Mircera 150 last venofer 100 x 10 start 11/8 Hectorol 3 just decreased from 4 Recent labs:  Hgb 10.3 11/3 - had transfusion 10/26  for Hgb 7.2 10/22 - presumed blood loss from bleeding access tsat 23% ferritin 742 iPTH 170 sensipar 30 renvela 2 ac per note - not in med list  Assessment/Plan: 1. UTI - of note she just completed 2 weeks of Fortaz on 11/5 for #7; on Rocephin 2. ESRD with mild hyperkalemia K 5.4 -  TTS HD today via catheter- kt/v marginal - would increase DFR to 800 consistently and if kinetics still low - would ^ tiime to 4 hours 3. Hypertension/volume  - BP runs a little high at baseline -  gets +/- EDW average UF goals 1- 2.5 4. Anemia  -recent transfusion - Hgb 10.3 11/3 - continue ESA and Fe 5. Metabolic bone disease -  Continue Hectorol, sensipar 30 and binders- 2 renvela ac 6. Nutrition - renal carb mod diet + multivitamian 7. Recent enterobacter cloace - wound drainage from right upper HERO - s/p 2 weeks of Fortaz; recultured drainage today - has f/u appt with [facility] 11/11  Sheffield Slider, PA-C [facility] Beeper [phone] 04/09/2015, 10:20 AM

## 2015-04-09 NOTE — Progress Notes (Signed)
ANTIBIOTIC CONSULT NOTE - INITIAL  Pharmacy Consult for Rocephin Indication: UTI and infected RUE AV Graft.  No Known Allergies  Patient Measurements: Height:  (147.3 cm) Weight: 128 lb 12 oz (58.4 kg) IBW/kg (Calculated) : 40.9  Vital Signs: Temp: 98.8 F (37.1 C) (11/08 1034) Temp Source: Oral (11/08 1034) BP: 139/118 mmHg (11/08 1437) Pulse Rate: 100 (11/08 1437) Intake/Output from previous day:   Intake/Output from this shift:    Labs:  Recent Labs  04/08/15 1945 04/09/15 1410  WBC 6.2 5.1  HGB 11.4* 11.0*  PLT 121* 121*  CREATININE 8.72*  --    Estimated Creatinine Clearance: 3.6 mL/min (by C-G formula based on Cr of 8.72). No results for input(s): VANCOTROUGH, VANCOPEAK, VANCORANDOM, GENTTROUGH, GENTPEAK, GENTRANDOM, TOBRATROUGH, TOBRAPEAK, TOBRARND, AMIKACINPEAK, AMIKACINTROU, AMIKACIN in the last 72 hours.   Microbiology: No results found for this or any previous visit (from the past 720 hour(s)).  Medical History: Past Medical History  Diagnosis Date  . Cough   . Diabetes mellitus     type 2  . GERD (gastroesophageal reflux disease)   . Hypertension   . Bronchitis   . Osteoarthritis   . Dyslipidemia     remote hx/notes 10/06/2009  . ESRD (end stage renal disease) on dialysis [facility])     Dr Coladonato/Powell  . Depression     lexapro  . Staphylococcus aureus bacteremia with sepsis (HCC)     thought from HD catheter  . Anxiety     Medications:  Prescriptions prior to admission  Medication Sig Dispense Refill Last Dose  . acetaminophen (TYLENOL) 500 MG tablet Take 500 mg by mouth 2 (two) times daily as needed.   Past Week at Unknown time  . amLODipine (NORVASC) 5 MG tablet Take 5 mg by mouth daily. Take half tablet on /11/2014 at Unknown time  . HYDROcodone-acetaminophen (NORCO) 5-325 MG tablet Take 1-2 tablets by mouth every 6 (six) hours as needed for moderate pain. 50 tablet 0 04/08/2015 at Unknown time  . loperamide (IMODIUM) 2 MG capsule Take 1 capsule (2 mg total) by mouth as needed for diarrhea or loose stools. 30 capsule 0 Past Week at Unknown time  . multivitamin (RENA-VIT) TABS tablet Take 1 tablet by mouth at bedtime. 90 tablet 0 04/07/2015 at Unknown time  . promethazine (PHENERGAN) 25 MG tablet Take 12.5 mg by mouth every 6 (six) hours as needed for nausea or vomiting.   Past Week at Unknown time  . quiNINE (QUALAQUIN) 324 MG capsule Take 324 mg by mouth 3 (three) times a week. Take on tuesdays thursdays and saturdays   Past Week  at Unknown time  . simvastatin (ZOCOR) 40 MG tablet Take 40 mg by mouth daily.   04/08/2015 at Unknown time  . cephALEXin (KEFLEX) 500 MG capsule Take 1 capsule (500 mg total) by mouth 2 (two) times daily. (Patient not taking: Reported on 02/18/2015) 10 capsule 0 Completed Course at Unknown time   Assessment: 81 y.o. F presents with AMS. Received Rocephin 1gm IV ~0300 in ED. Now to continue rocephin for possible UTI and RUE AV Graft infection. She is afebrile, wbc wnl. Wound and blood cultures are pending.  Goal of Therapy:  Resolution of infection  Plan:  Rocephin 2gm IV q24h, next dose at 1600 Pharmacy will sign off - please reconsult if  needed.  Bayard HuggerMei Kamri Gotsch, PharmD, BCPS  Clinical Pharmacist  Pager: [phone]   04/09/2015,2:52 PM

## 2015-04-09 NOTE — Progress Notes (Signed)
Utilization Review Completed.[name] T1/18/2016  

## 2015-04-09 NOTE — Progress Notes (Addendum)
Agitated pre HD today.  Discussed with Dr. Allena KatzPatel.  Given age, will try 0.5 mg haldol po and given it a little time to work before we attempt HD via her femoral cath  [name], PA-C  Addendum: refusing meds;  Change to IV 0.5 haldol x 1 - resumed telemetry per IV haldol orders  (it had been initially d/c'd due to pts refusal).  MBergman, PA-C

## 2015-04-09 NOTE — Progress Notes (Signed)
Hemodialysis- Treatment ended after 3 hours d/t non fuctioning catheter. Unable to achieve bfr 200. All blood given back before system could clot. Pt had bm. While cleaning her noticed tunneled wound to R Groin (HD catheter lumen visible). Pt had only allowed us to examine insertion site with dressing change prior. Will notify renal.

## 2015-04-09 NOTE — Consult Note (Addendum)
WOC wound consult note Reason for Consult: Consult requested for right arm wound.  Pt had infected graft site removed in surgery during October, according to EMR, and has been followed by the [facility] service prior to admission. Wound type: Full thickness Dressing procedure/placement/frequency: Attempted to assess wound.  Pt became angry and stated, " I am not going to let you look at nothing".  She attempted to hit WOC nurse and became very agitated.  If bedside nurse is able to convince her to perform a dressing change, then moist gauze packing can be applied and pt can resume follow-up with [facility]VS team after discharge. Please re-consult if further assistance is needed.  Thank-you,  Cammie Mcgeeawn Tawnya Pujol MSN, RN, CWOCN, [place]CN-AP, CNS [phone]

## 2015-04-09 NOTE — Progress Notes (Addendum)
Pt remains confused and continues to refuse telemetry. Sitter at bedside.

## 2015-04-09 NOTE — H&P (Signed)
[facility] History and Physical  [name] JXB:[id number] DOB: 1929/02/22 DOA: 04/08/2015  Referring physician: ED PCP: Eustaquio BoydenJavier Gutierrez, MD   Chief Complaint: Altered mental status  HPI:  Patient is 81 y.o. female with PMH significant for ESRD on HD T/TH/Sat, HTN, diet controlled diabetes mellitus type 2, HLD, and GERD; who was brought in by her daughter-in-law who provides history due to AMS. At baseline the patient is pretty lucid and self-sufficient. Family members noticed an acute change in the last 1-2 days as patient had been incontinent of stool which is not normal for her. Also she had started to ramble on about things and asked multiple questions repeatedly. Family notes when she gets like this she likely has a urinary tract infection although patient does not produce urine. She usually retains urine and it becomes infected. Furthermore the family notes that she has an infected right upper extremity graft for which she's been going to [facility] although they have not placed her on any antibiotics and the wound continues to drain and has a foul odor and pus like discharge. Patient complains of pain in the right upper extremity    Patient was unable to keep anything down and patient expressed similar behaviors in the past with UTI according to daughter in law.   Work up included a UA suggesting UTI. [facility] called to admit patient for further evaluation and treatment. She received IVF's, and received Rocephin IV.    Review of Systems  Constitutional: Negative for fever and chills.  HENT: Negative for hearing loss.   Eyes: Negative for pain and discharge.  Respiratory: Negative for cough and hemoptysis.   Cardiovascular: Negative for chest pain and leg swelling.  Gastrointestinal: Negative for nausea, vomiting, abdominal pain and blood in stool.       Incontinence of stool  Genitourinary: Negative for urgency and frequency.  Musculoskeletal:  Positive for myalgias. Negative for falls.  Skin: Negative for itching and rash.       Nonhealing wound at the right upper extremity  Neurological: Negative for dizziness and focal weakness.  Psychiatric/Behavioral: Positive for memory loss. Negative for hallucinations.       Positive for confusion       Past Medical History  Diagnosis Date  . Cough   . Diabetes mellitus     type 2  . GERD (gastroesophageal reflux disease)   . Hypertension   . Bronchitis   . Osteoarthritis   . Dyslipidemia     remote hx/notes 10/06/2009  . ESRD (end stage renal disease) on dialysis [facility](HCC)     Dr Coladonato/Powell  . Depression     lexapro  . Staphylococcus aureus bacteremia with sepsis (HCC)     thought from HD catheter  . Anxiety      Past Surgical History  Procedure Laterality Date  . Eye surgery      Cataract extraction  . Thrombectomy and revision of arterioventous (av) goretex  graft Left 12/2004; 01/2006; 03/12/2009    forearmnotes 10/13/2010; forearmnotes 10/13/2010; upper arm/notes 03/19/2009  . Abdominal hysterectomy      remote hx/notes 10/06/2009  . Vitrectomy Left 03/2001    with membrane peel/notes 10/13/2010  . Arteriovenous graft placement Left 07/2004    forearm/notes 10/13/2010  . Arteriovenous graft placement Left 03/2006    upper armnotes 10/13/2010  . Peripheral vascular catheterization Right 02/18/2015    Procedure: A/V Shuntogram/Fistulagram;  Surgeon: Annice NeedyJason S [name], MD;  Location: [facility] INVASIVE CV LAB;  Service:  Cardiovascular;  Laterality: Right;  . Peripheral vascular catheterization N/A 02/18/2015    Procedure: A/V Shunt Intervention;  Surgeon: Annice Needy, MD;  Location: [facility] INVASIVE CV LAB;  Service: Cardiovascular;  Laterality: N/A;  . Peripheral vascular catheterization N/A 03/06/2015    Procedure: Dialysis/Perma Catheter Insertion;  Surgeon: Annice Needy, MD;  Location: [facility] INVASIVE CV LAB;  Service: Cardiovascular;  Laterality: N/A;  . Revision of arteriovenous  goretex graft Right 03/08/2015    Procedure: REVISION OF ARTERIOVENOUS GORETEX GRAFT;  Surgeon: Renford Dills, MD;  Location: [facility] ORS;  Service: Vascular;  Laterality: Right;      Social History:  reports that she has never smoked. She does not have any smokeless tobacco history on file. She reports that she does not drink alcohol or use illicit drugs. where does patient live--home  Can patient participate in ADLs? yes  No Known Allergies  Family History  Problem Relation Age of Onset  . Hypertension Mother        Prior to Admission medications   Medication Sig Start Date End Date Taking? Authorizing Provider  acetaminophen (TYLENOL) 500 MG tablet Take 500 mg by mouth 2 (two) times daily as needed.   Yes Historical Provider, MD  amLODipine (NORVASC) 5 MG tablet Take 5 mg by mouth daily. Take half tablet on Sundays mondays wednesdays and fridays 04/05/12  Yes Eulis Foster, FNP  aspirin 81 MG tablet Take 81 mg by mouth daily.   Yes Historical Provider, MD  cinacalcet (SENSIPAR) 30 MG tablet Take 30 mg by mouth daily.   Yes Historical Provider, MD  Dorzolamide HCl-Timolol Mal (COSOPT OP) Place 1 drop into both eyes daily.    Yes Historical Provider, MD  escitalopram (LEXAPRO) 10 MG tablet Take 0.5 tablets (5 mg total) by mouth daily. 03/15/15  Yes Eustaquio Boyden, MD  famotidine (PEPCID) 20 MG tablet Take 1 tablet (20 mg total) by mouth 2 (two) times daily. Patient taking differently: Take 20 mg by mouth daily.  11/20/14  Yes Calvert Cantor, MD  folic acid-vitamin b complex-vitamin c-selenium-zinc (DIALYVITE) 3 MG TABS Take 1 tablet by mouth daily.     Yes Historical Provider, MD  HYDROcodone-acetaminophen (NORCO) 5-325 MG tablet Take 1-2 tablets by mouth every 6 (six) hours as needed for moderate pain. 03/08/15  Yes Renford Dills, MD  loperamide (IMODIUM) 2 MG capsule Take 1 capsule (2 mg total) by mouth as needed for diarrhea or loose stools. 07/28/13  Yes Maryann Mikhail, DO   multivitamin (RENA-VIT) TABS tablet Take 1 tablet by mouth at bedtime. 02/01/15  Yes Eustaquio Boyden, MD  promethazine (PHENERGAN) 25 MG tablet Take 12.5 mg by mouth every 6 (six) hours as needed for nausea or vomiting.   Yes Historical Provider, MD  quiNINE (QUALAQUIN) 324 MG capsule Take 324 mg by mouth 3 (three) times a week. Take on tuesdays thursdays and saturdays   Yes Historical Provider, MD  simvastatin (ZOCOR) 40 MG tablet Take 40 mg by mouth daily.   Yes Historical Provider, MD  cephALEXin (KEFLEX) 500 MG capsule Take 1 capsule (500 mg total) by mouth 2 (two) times daily. Patient not taking: Reported on 02/18/2015 02/08/15   Tomasita Crumble, MD     Physical Exam: Filed Vitals:   04/08/15 1917 04/08/15 2252  BP: 171/101 144/42  Pulse: 94 80  Temp: 98.5 F (36.9 C) 98.1 F (36.7 C)  TempSrc: Oral Oral  Resp: 26 16  SpO2: 99% 99%     Constitutional: Vital  signs reviewed. Patient is a well-developed and well-nourished in no acute distress and cooperative with exam. Alert and oriented x2 Head: Normocephalic and atraumatic  Ear: TM normal bilaterally  Mouth: no erythema or exudates, MMM  Eyes: PERRL, EOMI, conjunctivae normal, No scleral icterus.  Neck: Supple, Trachea midline normal ROM, No JVD, mass, thyromegaly, or carotid bruit present.  Cardiovascular: RRR, S1 normal, S2 normal, no MRG, pulses symmetric and intact bilaterally  Pulmonary/Chest: CTAB, no wheezes, rales, or rhonchi  Abdominal: Soft. Non-tender, non-distended, bowel sounds are normal, no masses, organomegaly, or guarding present.  GU: no CVA tenderness Musculoskeletal: No joint deformities, erythema, or stiffness, ROM full and no nontender Ext: no edema and no cyanosis, pulses palpable bilaterally (DP and PT)  Hematology: no cervical, inginal, or axillary adenopathy.  Neurological:Strenght is normal and symmetric bilaterally, cranial nerve II-XII are grossly intact, no focal motor deficit, sensory intact to light  touch bilaterally.  Skin: Right upper extremity wound is approximately 2 inches in length that is open draining pus with a foul odor.  Warm, dry and intact. No rash, cyanosis, or clubbing.  Psychiatric: Pleasantly confused oriented only to person and place as this is previously been stated to the patient. Patient asked repeated questions.  Data Review   Micro Results No results found for this or any previous visit (from the past 240 hour(s)).  Radiology Reports Dg Chest 2 View  04/08/2015  CLINICAL DATA:  Altered mental status. EXAM: CHEST  2 VIEW COMPARISON:  11/16/2014 FINDINGS: Unchanged large-bore right central venous catheter, tip at the atrial caval junction. Cardiomegaly is unchanged. No consolidation, pleural effusion or pneumothorax. No pulmonary edema. Vascular stent seen in the right arm. The bones are under mineralized. IMPRESSION: Stable cardiomegaly.  No acute pulmonary process. Electronically Signed   By: Rubye Oaks M.D.   On: 04/08/2015 23:46   Ct Head Wo Contrast  04/08/2015  CLINICAL DATA:  Confusion and disorientation today. EXAM: CT HEAD WITHOUT CONTRAST TECHNIQUE: Contiguous axial images were obtained from the base of the skull through the vertex without intravenous contrast. COMPARISON:  11/16/2014 FINDINGS: Stable age related cerebral atrophy, ventriculomegaly and periventricular white matter disease. No extra-axial fluid collections are identified. No CT findings for acute hemispheric infarction or intracranial hemorrhage. No mass lesions. The brainstem and cerebellum are normal. Stable calcified right orbital mass. No skull fracture or bone lesion. The paranasal sinuses are grossly clear. Mild Stable left-sided ethmoid disease. Remote blow in type fracture of the right orbit is noted. IMPRESSION: 1. Stable age related cerebral atrophy, ventriculomegaly and periventricular white matter disease. No acute intracranial findings. 2. Stable calcified right orbital mass. 3. No  acute bony findings. Electronically Signed   By: Rudie Meyer M.D.   On: 04/08/2015 23:41     CBC  Recent Labs Lab 04/08/15 1945  WBC 6.2  HGB 11.4*  HCT 36.1  PLT 121*  MCV 90.9  MCH 28.7  MCHC 31.6  RDW 18.9*    Chemistries   Recent Labs Lab 04/08/15 1945  NA 138  K 5.4*  CL 99*  CO2 26  GLUCOSE 93  BUN 41*  CREATININE 8.72*  CALCIUM 9.6  AST 21  ALT 10*  ALKPHOS 83  BILITOT 1.0   ------------------------------------------------------------------------------------------------------------------ CrCl cannot be calculated (Unknown ideal weight.). ------------------------------------------------------------------------------------------------------------------ No results for input(s): HGBA1C in the last 72 hours. ------------------------------------------------------------------------------------------------------------------ No results for input(s): CHOL, HDL, LDLCALC, TRIG, CHOLHDL, LDLDIRECT in the last 72 hours. ------------------------------------------------------------------------------------------------------------------ No results for input(s): TSH, T4TOTAL, T3FREE, THYROIDAB in  the last 72 hours.  Invalid input(s): FREET3 ------------------------------------------------------------------------------------------------------------------ No results for input(s): VITAMINB12, FOLATE, FERRITIN, TIBC, IRON, RETICCTPCT in the last 72 hours.  Coagulation profile No results for input(s): INR, PROTIME in the last 168 hours.  No results for input(s): DDIMER in the last 72 hours.  Cardiac Enzymes No results for input(s): CKMB, TROPONINI, MYOGLOBIN in the last 168 hours.  Invalid input(s): CK ------------------------------------------------------------------------------------------------------------------ Invalid input(s): POCBNP   CBG:  Recent Labs Lab 04/08/15 2128 04/08/15 2238  GLUCAP 84 86       EKG: Independently reviewed.     Assessment/Plan Principal Problem:  Recurrent urinary tract infection: Recurrent issue last occurred in June of this year. Although patient does not produce a lot of urine when in and out cath has been found repeatedly to have a positive UTIs when found to be altered. 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 Patient was being seen [facility] for this issue but had not been currently on antibiotics. Wound draining pus with foul odor - Continue antibiotics  -Question need of surgical I&D -wound care consult ordered  Acute encephalopathy secondary to above  -Continue to monitor mental status and evaluate for other causes as    Type 2 diabetes with nephropathy (HCC) stable patient is apparently diet controlled last hemoglobin A1c was 5.3 - Continue carb modified diet -CBG checks before meals    HLD (hyperlipidemia): stable -Continue home simvastatin    GERD: stable -Continue Pepcid    ESRD on hemodialysis [facility]) patient dialyzes Tuesday Thursday Saturday -Consult to nephrology for dialysis - Electrolytes disturbances of hyperkalemia to be addressed during dialysis    Hypoalbuminemia anemia of 3.1   ANEMIA, IRON DEFICIENCY, CHRONIC  -continue to monitor    Thrombocytopenia (HCC)Chronic issue platelet count is 121    Code Status:   full Family Communication: bedside Disposition Plan: admit   Total time spent 55 minutes.Greater than 50% of this time was spent in counseling, explanation of diagnosis, planning of further management, and coordination of care  Clydie Braun [facility] Pager [phone]  If 7PM-7AM, please contact night-coverage [email or web address] Password [facility] 04/09/2015, 12:59 AM

## 2015-04-09 NOTE — Progress Notes (Signed)
HD RN informed unit CN of an open wound on patient's right groin with tunneling and being able to visualize tunneled HD catheter. She stated she has already notified Dr. Lowell GuitarPowell and is awaiting new orders. Wound measured and documented. Will continue to monitor.  Leanna BattlesEckelmann, Sedonia Kitner Eileen, RN.

## 2015-04-09 NOTE — Progress Notes (Signed)
PROGRESS NOTE    [name] ZOX:[id number] DOB: 1928/12/31 DOA: 04/08/2015 PCP: Eustaquio BoydenJavier Gutierrez, MD  HPI/Brief narrative 81 year old female patient with history of ESRD on TTS HD, HTN, diet-controlled DM 2, HLD, GERD, prior staph aureus bacteremia related to HD catheter, anxiety & depression, [facility]/15 with altered mental status (apparently pretty lucid and self-sufficient at baseline) off 1-2 days duration. Per family, she gets like this when she has a UTI and family also noted infected right upper extremity graft for which [facility] has [facility]-however not on antibiotics and the wound continues to have foul-smelling drainage. Admitted for acute encephalopathy related to UTI, infected right upper extremity graft. Nephrology consulted.    Assessment/Plan:  Recurrent UTI - Started on IV Rocephin pending culture results. Apparently does not make much urine.  RUE AV graft infection - Apparently has been going on for at least 2 months. Has been seen at [facility] but not on antibiotics. Wound draining foul-smelling pus intermittently. - Discussed with Dr. Allena KatzPatel, Nephrology: Recommends vascular surgery consultation. - Continue IV Rocephin for now. - Requested nephrology to drop blood cultures across HD. Follow wound culture results.  Acute encephalopathy - Secondary to acute infections. Treat underlying condition and monitor. - As per nursing, patient not cooperating with several aspects of care-refusing lab draws, dialysis etc. - CT head without acute findings.  ESRD on TTS HD/mild hyperkalemia - Management per nephrology-consulted.  Type II DM with renal complications - Diet controlled. Last A1c: 5.3 - Monitor CBGs.  Hyperlipidemia - Continue statins  GERD - Continue PPI  Anemia and thrombocytopenia - Follow CBCs  Essential hypertension - Controlled  Stable calcified right orbital mass, seen on CT head - Unclear etiology. Outpatient  follow-up.   DVT prophylaxis: Subcutaneous heparin Code Status: full  Family Communication: none at bedside Disposition Plan: DC home when medically stable   Consultants:  Nephrology  Vascular surgery - have been called  Procedures:   None  Antibiotics:   IV Rocephin 11/7 >   Subjective: Pleasantly confused. Denies complaints. As per nursing, patient refusing several aspects of care due to confusion.  Objective: Filed Vitals:   04/09/15 0045 04/09/15 0330 04/09/15 0519 04/09/15 1034  BP:  102/75 138/66 160/66  Pulse: 79 83 84 93  Temp:   99.9 F (37.7 C) 98.8 F (37.1 C)  TempSrc:   Oral Oral  Resp:   20 18  Height:   4\' 10"  (1.473 m)   Weight:   63.504 kg (140 lb)   SpO2: 97% 100% 100% 93%    Intake/Output Summary (Last 24 hours) at 04/09/15 1409 Last data filed at 04/09/15 0945  Gross per 24 hour  Intake      0 ml  Output      0 ml  Net      0 ml   Filed Weights   04/09/15 0519  Weight: 63.504 kg (140 lb)     Exam:  General exam:  Pleasant elderly female lying comfortably supine in bed. Respiratory system: Clear. No increased work of breathing. Cardiovascular system: S1 & S2 heard, RRR. No JVD, murmurs, gallops, clicks or pedal edema. Telemetry: Sinus rhythm. Gastrointestinal system: Abdomen is nondistended, soft and nontender. Normal bowel sounds heard. Central nervous system: Alert and oriented only to self . No focal neurological deficits. Extremities: Symmetric 5 x 5 power. Right cubital foci AV graft site with open wound and visible infected stent. No overt drainage at this time.  Data Reviewed: Basic Metabolic Panel:  Recent Labs Lab 04/08/15 1945  NA 138  K 5.4*  CL 99*  CO2 26  GLUCOSE 93  BUN 41*  CREATININE 8.72*  CALCIUM 9.6   Liver Function Tests:  Recent Labs Lab 04/08/15 1945  AST 21  ALT 10*  ALKPHOS 83  BILITOT 1.0  PROT 7.9  ALBUMIN 3.1*   No results for input(s): LIPASE, AMYLASE in the last 168 hours. No  results for input(s): AMMONIA in the last 168 hours. CBC:  Recent Labs Lab 04/08/15 1945  WBC 6.2  HGB 11.4*  HCT 36.1  MCV 90.9  PLT 121*   Cardiac Enzymes: No results for input(s): CKTOTAL, CKMB, CKMBINDEX, TROPONINI in the last 168 hours. BNP (last 3 results) No results for input(s): PROBNP in the last 8760 hours. CBG:  Recent Labs Lab 04/08/15 2128 04/08/15 2238  GLUCAP 84 86    No results found for this or any previous visit (from the past 240 hour(s)).        Studies: Dg Chest 2 View  04/08/2015  CLINICAL DATA:  Altered mental status. EXAM: CHEST  2 VIEW COMPARISON:  11/16/2014 FINDINGS: Unchanged large-bore right central venous catheter, tip at the atrial caval junction. Cardiomegaly is unchanged. No consolidation, pleural effusion or pneumothorax. No pulmonary edema. Vascular stent seen in the right arm. The bones are under mineralized. IMPRESSION: Stable cardiomegaly.  No acute pulmonary process. Electronically Signed   By: Rubye Oaks M.D.   On: 04/08/2015 23:46   Ct Head Wo Contrast  04/08/2015  CLINICAL DATA:  Confusion and disorientation today. EXAM: CT HEAD WITHOUT CONTRAST TECHNIQUE: Contiguous axial images were obtained from the base of the skull through the vertex without intravenous contrast. COMPARISON:  11/16/2014 FINDINGS: Stable age related cerebral atrophy, ventriculomegaly and periventricular white matter disease. No extra-axial fluid collections are identified. No CT findings for acute hemispheric infarction or intracranial hemorrhage. No mass lesions. The brainstem and cerebellum are normal. Stable calcified right orbital mass. No skull fracture or bone lesion. The paranasal sinuses are grossly clear. Mild Stable left-sided ethmoid disease. Remote blow in type fracture of the right orbit is noted. IMPRESSION: 1. Stable age related cerebral atrophy, ventriculomegaly and periventricular white matter disease. No acute intracranial findings. 2. Stable  calcified right orbital mass. 3. No acute bony findings. Electronically Signed   By: Rudie Meyer M.D.   On: 04/08/2015 23:41        Scheduled Meds: . Melene Muller ON 04/10/2015] amLODipine  2.5 mg Oral Once per day on Sun Mon Wed Fri  . amLODipine  5 mg Oral Once per day on Tue Thu Sat  . aspirin  81 mg Oral Daily  . cinacalcet  30 mg Oral Q breakfast  . dorzolamide-timolol  1 drop Both Eyes Daily  . doxercalciferol  3 mcg Intravenous Q T,Th,Sa-HD  . escitalopram  5 mg Oral Daily  . famotidine  20 mg Oral Daily  . ferric gluconate (FERRLECIT/NULECIT) IV  125 mg Intravenous Q T,Th,Sa-HD  . heparin  5,000 Units Subcutaneous 3 times per day  . multivitamin  1 tablet Oral QHS  . quiNINE  324 mg Oral Once per day on Tue Thu Sat  . sevelamer carbonate  1,600 mg Oral TID WC  . simvastatin  40 mg Oral q1800   Continuous Infusions:   Principal Problem:   Recurrent urinary tract infection Active Problems:   Type 2 diabetes with nephropathy (HCC)   HLD (hyperlipidemia)   GERD  ESRD on hemodialysis (HCC)   ANEMIA, IRON DEFICIENCY, CHRONIC   Thrombocytopenia (HCC)   UTI (lower urinary tract infection)    Time spent: 35 minutes.    Marcellus Scott, MD, FACP, FHM. [facility] Pager [phone]  If 7PM-7AM, please contact night-coverage [email or web address] Password [email or web address] 04/09/2015, 2:09 PM    LOS: 0 days

## 2015-04-09 NOTE — Progress Notes (Signed)
Hemodialysis- System clotted, unable to rinse venous line back. System reset. When attempted to restart tx, catheter would not pull, either lumen. TPA ordered for pt.

## 2015-04-09 NOTE — Progress Notes (Signed)
CRITICAL VALUE ALERT  Critical value received: K >7.5  Date of notification:  04/09/15  Time of notification:  1545  Critical value read back:yes  Nurse who received alert: Roosvelt HarpsKristyn Sara Keys RN  MD notified (1st page):  Allena KatzPatel  Time of first page:  1545  MD notified (2nd page):  Time of second page:  Responding MD:  Allena KatzPatel  Time MD responded:  1546  Will place on 1K bath for 1 hour during HD. No further orders received.

## 2015-04-09 NOTE — Procedures (Signed)
Patient seen on Hemodialysis. QB 220, UF goal 1.5L Treatment adjusted as needed.  Zetta BillsJay Othon Guardia MD [facility]. Office # [phone] Pager # [phone] 2:35 PM

## 2015-04-09 NOTE — Progress Notes (Signed)
Per Dr. Allena KatzPatel continue to attempt HD.

## 2015-04-10 ENCOUNTER — Inpatient Hospital Stay (HOSPITAL_COMMUNITY): Payer: Medicare Other | Admitting: Anesthesiology

## 2015-04-10 ENCOUNTER — Encounter (HOSPITAL_COMMUNITY): Payer: Self-pay | Admitting: Anesthesiology

## 2015-04-10 ENCOUNTER — Inpatient Hospital Stay (HOSPITAL_COMMUNITY): Payer: Medicare Other

## 2015-04-10 ENCOUNTER — Encounter (HOSPITAL_COMMUNITY)
Admission: EM | Disposition: A | Discharge status: Home Health | Payer: Medicare Other | Source: Home / Self Care | Attending: Internal Medicine

## 2015-04-10 HISTORY — PX: INSERTION OF DIALYSIS CATHETER: SHX1324

## 2015-04-10 HISTORY — PX: REMOVAL OF A DIALYSIS CATHETER: SHX6053

## 2015-04-10 HISTORY — PX: AVGG REMOVAL: SHX5153

## 2015-04-10 LAB — BASIC METABOLIC PANEL
ANION GAP: 12 (ref 5–15)
Anion gap: 16 — ABNORMAL HIGH (ref 5–15)
Anion gap: 11 (ref 5–15)
BUN: 34 mg/dL — ABNORMAL HIGH (ref 6–20)
BUN: 39 mg/dL — ABNORMAL HIGH (ref 6–20)
BUN: 39 mg/dL — ABNORMAL HIGH (ref 6–20)
CALCIUM: 8.9 mg/dL (ref 8.9–10.3)
CO2: 25 mmol/L (ref 22–32)
CO2: 17 mmol/L — ABNORMAL LOW (ref 22–32)
CO2: 25 mmol/L (ref 22–32)
CREATININE: 8.45 mg/dL — AB (ref 0.44–1.00)
Calcium: 8.6 mg/dL — ABNORMAL LOW (ref 8.9–10.3)
Calcium: 8.4 mg/dL — ABNORMAL LOW (ref 8.9–10.3)
Chloride: 100 mmol/L — ABNORMAL LOW (ref 101–111)
Chloride: 105 mmol/L (ref 101–111)
Chloride: 101 mmol/L (ref 101–111)
Creatinine, Ser: 9.08 mg/dL — ABNORMAL HIGH (ref 0.44–1.00)
Creatinine, Ser: 9.06 mg/dL — ABNORMAL HIGH (ref 0.44–1.00)
GFR calc Af Amer: 4 mL/min — ABNORMAL LOW (ref >60)
GFR calc Af Amer: 4 mL/min — ABNORMAL LOW (ref >60)
GFR calc non Af Amer: 3 mL/min — ABNORMAL LOW (ref >60)
GFR calc non Af Amer: 4 mL/min — ABNORMAL LOW (ref >60)
GFR, EST AFRICAN AMERICAN: 4 mL/min — AB (ref >60)
GFR, EST NON AFRICAN AMERICAN: 4 mL/min — AB (ref >60)
GLUCOSE: 101 mg/dL — AB (ref 65–99)
Glucose, Bld: 126 mg/dL — ABNORMAL HIGH (ref 65–99)
Glucose, Bld: 138 mg/dL — ABNORMAL HIGH (ref 65–99)
Potassium: 5.2 mmol/L — ABNORMAL HIGH (ref 3.5–5.1)
Potassium: 6.8 mmol/L (ref 3.5–5.1)
Potassium: 6.5 mmol/L (ref 3.5–5.1)
Sodium: 137 mmol/L (ref 135–145)
Sodium: 138 mmol/L (ref 135–145)
Sodium: 137 mmol/L (ref 135–145)

## 2015-04-10 LAB — BLOOD GAS, ARTERIAL
ACID-BASE DEFICIT: 1.6 mmol/L (ref 0.0–2.0)
Bicarbonate: 22.9 mEq/L (ref 20.0–24.0)
DRAWN BY: 24610
O2 CONTENT: 6 L/min
O2 SAT: 100 %
Patient temperature: 98.6
TCO2: 24.2 mmol/L (ref 0–100)
pCO2 arterial: 40.8 mmHg (ref 35.0–45.0)
pH, Arterial: 7.368 (ref 7.350–7.450)
pO2, Arterial: 293 mmHg — ABNORMAL HIGH (ref 80.0–100.0)

## 2015-04-10 LAB — CBC
HCT: 28.6 % — ABNORMAL LOW (ref 36.0–46.0)
HEMATOCRIT: 31 % — AB (ref 36.0–46.0)
HEMOGLOBIN: 9.9 g/dL — AB (ref 12.0–15.0)
Hemoglobin: 9.6 g/dL — ABNORMAL LOW (ref 12.0–15.0)
MCH: 28.4 pg (ref 26.0–34.0)
MCH: 28.9 pg (ref 26.0–34.0)
MCHC: 31.9 g/dL (ref 30.0–36.0)
MCHC: 33.6 g/dL (ref 30.0–36.0)
MCV: 88.8 fL (ref 78.0–100.0)
MCV: 86.1 fL (ref 78.0–100.0)
Platelets: 105 10*3/uL — ABNORMAL LOW (ref 150–400)
Platelets: 95 10*3/uL — ABNORMAL LOW (ref 150–400)
RBC: 3.49 MIL/uL — AB (ref 3.87–5.11)
RBC: 3.32 MIL/uL — ABNORMAL LOW (ref 3.87–5.11)
RDW: 18.5 % — ABNORMAL HIGH (ref 11.5–15.5)
RDW: 18.3 % — ABNORMAL HIGH (ref 11.5–15.5)
WBC: 5.4 10*3/uL (ref 4.0–10.5)
WBC: 6.7 10*3/uL (ref 4.0–10.5)

## 2015-04-10 LAB — SURGICAL PCR SCREEN
MRSA, PCR: NEGATIVE
Staphylococcus aureus: NEGATIVE

## 2015-04-10 SURGERY — REMOVAL OF ARTERIOVENOUS GORETEX GRAFT (AVGG)
Anesthesia: General | Site: Groin | Laterality: Right

## 2015-04-10 MED ORDER — PHENYLEPHRINE HCL 10 MG/ML IJ SOLN
INTRAMUSCULAR | Status: DC | PRN
  Administered 2015-04-10: 80 ug via INTRAVENOUS
  Administered 2015-04-10: 40 ug via INTRAVENOUS

## 2015-04-10 MED ORDER — DARBEPOETIN ALFA 150 MCG/0.3ML IJ SOSY
150.0000 ug | PREFILLED_SYRINGE | Freq: Once | INTRAMUSCULAR | Status: AC
  Administered 2015-04-10: 150 ug via INTRAVENOUS

## 2015-04-10 MED ORDER — PENTAFLUOROPROP-TETRAFLUOROETH EX AERO: 1.0000 "application " | INHALATION_SPRAY | CUTANEOUS | Status: DC | PRN

## 2015-04-10 MED ORDER — LIDOCAINE HCL (CARDIAC) 20 MG/ML IV SOLN
INTRAVENOUS | Status: DC | PRN
  Administered 2015-04-10: 50 mg via INTRAVENOUS

## 2015-04-10 MED ORDER — DARBEPOETIN ALFA 150 MCG/0.3ML IJ SOSY: 150.0000 ug | PREFILLED_SYRINGE | INTRAMUSCULAR | Status: DC

## 2015-04-10 MED ORDER — LIDOCAINE HCL (PF) 1 % IJ SOLN: 5.0000 mL | INTRAMUSCULAR | Status: DC | PRN

## 2015-04-10 MED ORDER — ALTEPLASE 2 MG IJ SOLR
2.0000 mg | Freq: Once | INTRAMUSCULAR | Status: DC | PRN
  Filled 2015-04-10: qty 2

## 2015-04-10 MED ORDER — LIDOCAINE-PRILOCAINE 2.5-2.5 % EX CREA: 1.0000 "application " | TOPICAL_CREAM | CUTANEOUS | Status: DC | PRN

## 2015-04-10 MED ORDER — SODIUM CHLORIDE 0.9 % IV SOLN: 100.0000 mL | INTRAVENOUS | Status: DC | PRN

## 2015-04-10 MED ORDER — SODIUM CHLORIDE 0.9 % IV SOLN
INTRAVENOUS | Status: DC
  Administered 2015-04-10 (×3): via INTRAVENOUS

## 2015-04-10 MED ORDER — ONDANSETRON HCL 4 MG/2ML IJ SOLN
INTRAMUSCULAR | Status: DC | PRN
  Administered 2015-04-10: 4 mg via INTRAVENOUS

## 2015-04-10 MED ORDER — HEPARIN SODIUM (PORCINE) 1000 UNIT/ML IJ SOLN
INTRAMUSCULAR | Status: AC
  Filled 2015-04-10: qty 1

## 2015-04-10 MED ORDER — SUCCINYLCHOLINE CHLORIDE 20 MG/ML IJ SOLN
INTRAMUSCULAR | Status: DC | PRN
  Administered 2015-04-10: 70 mg via INTRAVENOUS

## 2015-04-10 MED ORDER — ARTIFICIAL TEARS OP OINT
TOPICAL_OINTMENT | OPHTHALMIC | Status: DC | PRN
  Administered 2015-04-10: 1 via OPHTHALMIC

## 2015-04-10 MED ORDER — THROMBIN 20000 UNITS EX KIT
PACK | CUTANEOUS | Status: AC
  Filled 2015-04-10: qty 1

## 2015-04-10 MED ORDER — HEMOSTATIC AGENTS (NO CHARGE) OPTIME
TOPICAL | Status: DC | PRN
  Administered 2015-04-10: 1 via TOPICAL

## 2015-04-10 MED ORDER — SODIUM CHLORIDE 0.9 % IV SOLN
INTRAVENOUS | Status: DC | PRN
  Administered 2015-04-10: 500 mL

## 2015-04-10 MED ORDER — PROPOFOL 10 MG/ML IV BOLUS
INTRAVENOUS | Status: DC | PRN
  Administered 2015-04-10: 80 mg via INTRAVENOUS

## 2015-04-10 MED ORDER — PHENYLEPHRINE HCL 10 MG/ML IJ SOLN
10.0000 mg | INTRAMUSCULAR | Status: DC | PRN
  Administered 2015-04-10: 15 ug/min via INTRAVENOUS

## 2015-04-10 MED ORDER — FENTANYL CITRATE (PF) 100 MCG/2ML IJ SOLN
INTRAMUSCULAR | Status: DC | PRN
  Administered 2015-04-10: 25 ug via INTRAVENOUS
  Administered 2015-04-10: 50 ug via INTRAVENOUS
  Administered 2015-04-10: 25 ug via INTRAVENOUS

## 2015-04-10 MED ORDER — ALTEPLASE 2 MG IJ SOLR: 2.0000 mg | Freq: Once | INTRAMUSCULAR | Status: DC | PRN

## 2015-04-10 MED ORDER — FENTANYL CITRATE (PF) 250 MCG/5ML IJ SOLN
INTRAMUSCULAR | Status: AC
  Filled 2015-04-10: qty 5

## 2015-04-10 MED ORDER — LIDOCAINE HCL 4 % MT SOLN
OROMUCOSAL | Status: DC | PRN
  Administered 2015-04-10: 4 mL via TOPICAL

## 2015-04-10 MED ORDER — HEPARIN SODIUM (PORCINE) 1000 UNIT/ML DIALYSIS: 1000.0000 [IU] | INTRAMUSCULAR | Status: DC | PRN

## 2015-04-10 MED ORDER — DARBEPOETIN ALFA 150 MCG/0.3ML IJ SOSY
PREFILLED_SYRINGE | INTRAMUSCULAR | Status: AC
  Filled 2015-04-10: qty 0.3

## 2015-04-10 MED ORDER — HEPARIN SODIUM (PORCINE) 1000 UNIT/ML DIALYSIS
1000.0000 [IU] | INTRAMUSCULAR | Status: DC | PRN
  Filled 2015-04-10: qty 1

## 2015-04-10 MED ORDER — HEPARIN SODIUM (PORCINE) 1000 UNIT/ML IJ SOLN
INTRAMUSCULAR | Status: DC | PRN
  Administered 2015-04-10: 1000 [IU]
  Administered 2015-04-10: 7 mL

## 2015-04-10 MED ORDER — FENTANYL CITRATE (PF) 100 MCG/2ML IJ SOLN: 25.0000 ug | INTRAMUSCULAR | Status: DC | PRN

## 2015-04-10 MED ORDER — LIDOCAINE-PRILOCAINE 2.5-2.5 % EX CREA
1.0000 | TOPICAL_CREAM | CUTANEOUS | Status: DC | PRN
Start: 2015-04-10 — End: 2015-04-15
  Filled 2015-04-10: qty 5

## 2015-04-10 MED ORDER — ONDANSETRON HCL 4 MG/2ML IJ SOLN
INTRAMUSCULAR | Status: AC
  Filled 2015-04-10: qty 2

## 2015-04-10 SURGICAL SUPPLY — 44 items
BNDG GAUZE ELAST 4 BULKY (GAUZE/BANDAGES/DRESSINGS) ×10 IMPLANT
CANISTER SUCTION 2500CC (MISCELLANEOUS) ×5 IMPLANT
CATH CANNON HEMO 15F 50CM (CATHETERS) ×10 IMPLANT
CLIP TI MEDIUM 6 (CLIP) ×5 IMPLANT
CLIP TI WIDE RED SMALL 6 (CLIP) ×5 IMPLANT
DECANTER SPIKE VIAL GLASS SM (MISCELLANEOUS) IMPLANT
DRAPE C-ARM 42X72 X-RAY (DRAPES) ×5 IMPLANT
ELECT REM PT RETURN 9FT ADLT (ELECTROSURGICAL) ×5
ELECTRODE REM PT RTRN 9FT ADLT (ELECTROSURGICAL) ×3 IMPLANT
FLUID NSS /IRRIG 3000 ML XXX (IV SOLUTION) ×5 IMPLANT
GAUZE IODOFORM PACK 1/2 7832 (GAUZE/BANDAGES/DRESSINGS) ×5 IMPLANT
GAUZE SPONGE 4X4 12PLY STRL (GAUZE/BANDAGES/DRESSINGS) ×5 IMPLANT
GAUZE SPONGE 4X4 16PLY XRAY LF (GAUZE/BANDAGES/DRESSINGS) ×5 IMPLANT
GLOVE BIO SURGEON STRL SZ 6.5 (GLOVE) ×4 IMPLANT
GLOVE BIO SURGEON STRL SZ7 (GLOVE) ×5 IMPLANT
GLOVE BIO SURGEONS STRL SZ 6.5 (GLOVE) ×1
GLOVE BIOGEL PI IND STRL 6.5 (GLOVE) ×9 IMPLANT
GLOVE BIOGEL PI IND STRL 7.5 (GLOVE) ×3 IMPLANT
GLOVE BIOGEL PI INDICATOR 6.5 (GLOVE) ×6
GLOVE BIOGEL PI INDICATOR 7.5 (GLOVE) ×2
GLOVE ECLIPSE 6.5 STRL STRAW (GLOVE) ×5 IMPLANT
GOWN STRL REUS W/ TWL LRG LVL3 (GOWN DISPOSABLE) ×9 IMPLANT
GOWN STRL REUS W/TWL LRG LVL3 (GOWN DISPOSABLE) ×6
GUIDEWIRE ANGLED .035X150CM (WIRE) ×5 IMPLANT
HANDPIECE INTERPULSE COAX TIP (DISPOSABLE) ×2
KIT BASIN OR (CUSTOM PROCEDURE TRAY) ×5 IMPLANT
KIT ROOM TURNOVER OR (KITS) ×5 IMPLANT
LIQUID BAND (GAUZE/BANDAGES/DRESSINGS) ×5 IMPLANT
NEEDLE HYPO 25GX1X1/2 BEV (NEEDLE) ×5 IMPLANT
NS IRRIG 1000ML POUR BTL (IV SOLUTION) ×5 IMPLANT
PACK CV ACCESS (CUSTOM PROCEDURE TRAY) ×5 IMPLANT
PAD ARMBOARD 7.5X6 YLW CONV (MISCELLANEOUS) ×10 IMPLANT
SET HNDPC FAN SPRY TIP SCT (DISPOSABLE) ×3 IMPLANT
SPONGE GAUZE 4X4 12PLY STER LF (GAUZE/BANDAGES/DRESSINGS) ×5 IMPLANT
SPONGE SURGIFOAM ABS GEL 100 (HEMOSTASIS) IMPLANT
STAPLER VISISTAT 35W (STAPLE) ×5 IMPLANT
SUT MNCRL AB 4-0 PS2 18 (SUTURE) IMPLANT
SUT PROLENE 6 0 BV (SUTURE) ×5 IMPLANT
SUT SILK 2 0 SH (SUTURE) ×5 IMPLANT
SUT VIC AB 3-0 SH 27 (SUTURE) ×2
SUT VIC AB 3-0 SH 27X BRD (SUTURE) ×3 IMPLANT
TUBE ANAEROBIC SPECIMEN COL (MISCELLANEOUS) ×5 IMPLANT
UNDERPAD 30X30 INCONTINENT (UNDERPADS AND DIAPERS) ×5 IMPLANT
WATER STERILE IRR 1000ML POUR (IV SOLUTION) ×5 IMPLANT

## 2015-04-10 NOTE — Progress Notes (Signed)
[facility] Progress Note  Assessment/Plan: 1. UTI - of note she just completed 2 weeks of Fortaz on 11/5 for #7; on Rocephin now; I do not see a urine culture in the computer 2. ESRD TTS -repeat K yesterday >7.5  Down to 5.2 today- for vascular procedures today - repeat BMP this after noon - if K ^ dialyze today instead of tomorrow. Not much improvement in Cr since yesterday suggesting poor kinetics - need to increase time to 4 hours 3. Hypertension/volume - BP runs a little high at baseline - gets +/- EDW average UF goals 1- 2.5; UF only 528 Tuesday 4. Anemia -recent transfusion - Hgb 10.3 11/3 - continue ESA and Fe 9.9 today - may drop some following procedures today 5. Metabolic bone disease - Continue Hectorol, sensipar 30 and binders- 2 renvela ac 6. Nutrition - renal carb mod diet + multivitamian - npo today - daughter said she ate well last night 7. Recent enterobacter cloace - wound drainage  With erosion of stent through the skin;from right upper HERO - s/p 2 weeks of Fortaz; recultured drainage 11/8; stent to be resected today by [facility] 8. Right groin wound - opening at groin crease the size of a dime - exposed tunneled cathter - no active drainage; plan removal today (have d/w Dr. Imogene Burn) and replace, likely on the left; favor adding Vanc empirically for several doses; will d/w Dr. Allena Katz 9. AMS- Head CT negative - likely secondary to infection and being in the hospital    Sheffield Slider, PA-C [facility] Beeper [phone] 04/10/2015,8:27 AM  LOS: 1 day   Subjective:   Emotionally labile today. Recognizes my face but not my name  Objective Filed Vitals:   04/09/15 1846 04/09/15 2050 04/10/15 0426 04/10/15 0615  BP: 146/67 159/82 174/54 154/50  Pulse: 91 90 89 90  Temp: 98 F (36.7 C) 98.8 F (37.1 C) 98.5 F (36.9 C)   TempSrc: Oral Oral Oral   Resp: Height:      Weight:      SpO2:  99% 100%    Physical Exam daughter and sitter  at bedside General: NAD, cooperative with exam Heart: RRR Lungs: no rales Abdomen: soft NT Extremities: no sig LE edema; right forearm mildly swollen - ? Bruise  Dialysis Access: right upper HERO and right TDC - right thigh cath exit ok, but dime size hole in the crease  Dialysis Orders: Center: [facility] EDW 58.5 -gets +/- EDW 3.75 hr 160 350/A 1.5 2 K 2.25 Ca profile 2 right upper HERO and right leg TDC heparin 3500 Mircera 150 last venofer 100 x 10 start 11/8 Hectorol 3 just decreased from 4 Recent labs: Hgb 10.3 11/3 - had transfusion 10/26 for Hgb 7.2 10/22 - presumed blood loss from bleeding access tsat 23% ferritin 742 iPTH 170 sensipar 30 renvela 2 ac per note - not in med list  Additional Objective Labs: Basic Metabolic Panel:  Recent Labs Lab 04/08/15 1945 04/09/15 1410 04/10/15 0410  NA 138 139 137  K 5.4* >7.5* 5.2*  CL 99* 100* 100*  CO2 GLUCOSE 93 103* 101*  BUN 41* 46* 34*  CREATININE 8.72* 9.96* 8.45*  CALCIUM 9.6 9.2 8.9  PHOS  --  6.2*  --    Liver Function Tests:  Recent Labs Lab 04/08/15 1945 04/09/15 1410  AST 21  --   ALT 10*  --   ALKPHOS 83  --  BILITOT 1.0  --   PROT 7.9  --   ALBUMIN 3.1* 3.0*   CBC:  Recent Labs Lab 04/08/15 1945 04/09/15 1410 04/10/15 0410  WBC 6.2 5.1 5.4  HGB 11.4* 11.0* 9.9*  HCT 36.1 34.2* 31.0*  MCV 90.9 89.1 88.8  PLT 121* 121* 105*   Blood Culture    Component Value Date/Time   SDES WOUND ARM RIGHT 04/09/2015 1054   SPECREQUEST Immunocompromised 04/09/2015 1054   CULT  04/09/2015 1054    NO GROWTH 1 DAY [facility]    REPTSTATUS PENDING 04/09/2015 1054    CBG:  Recent Labs Lab 04/08/15 2128 04/08/15 2238 04/09/15 1807  GLUCAP 84 86 114*    Studies/Results: Dg Chest 2 View  04/08/2015  CLINICAL DATA:  Altered mental status. EXAM: CHEST  2 VIEW COMPARISON:  11/16/2014 FINDINGS: Unchanged large-bore right central venous catheter, tip at the atrial caval  junction. Cardiomegaly is unchanged. No consolidation, pleural effusion or pneumothorax. No pulmonary edema. Vascular stent seen in the right arm. The bones are under mineralized. IMPRESSION: Stable cardiomegaly.  No acute pulmonary process. Electronically Signed   By: Rubye OaksMelanie  Ehinger M.D.   On: 04/08/2015 23:46   Ct Head Wo Contrast  04/08/2015  CLINICAL DATA:  Confusion and disorientation today. EXAM: CT HEAD WITHOUT CONTRAST TECHNIQUE: Contiguous axial images were obtained from the base of the skull through the vertex without intravenous contrast. COMPARISON:  11/16/2014 FINDINGS: Stable age related cerebral atrophy, ventriculomegaly and periventricular white matter disease. No extra-axial fluid collections are identified. No CT findings for acute hemispheric infarction or intracranial hemorrhage. No mass lesions. The brainstem and cerebellum are normal. Stable calcified right orbital mass. No skull fracture or bone lesion. The paranasal sinuses are grossly clear. Mild Stable left-sided ethmoid disease. Remote blow in type fracture of the right orbit is noted. IMPRESSION: 1. Stable age related cerebral atrophy, ventriculomegaly and periventricular white matter disease. No acute intracranial findings. 2. Stable calcified right orbital mass. 3. No acute bony findings. Electronically Signed   By: Rudie MeyerP.  Gallerani M.D.   On: 04/08/2015 23:41   Medications:   . amLODipine  2.5 mg Oral Once per day on Sun Mon Wed Fri  . amLODipine  5 mg Oral Once per day on Tue Thu Sat  . aspirin  81 mg Oral Daily  . cefTRIAXone (ROCEPHIN)  IV  2 g Intravenous Q24H  . cinacalcet  30 mg Oral Q breakfast  . dorzolamide-timolol  1 drop Both Eyes Daily  . doxercalciferol  3 mcg Intravenous Q T,Th,Sa-HD  . escitalopram  5 mg Oral Daily  . famotidine  20 mg Oral Daily  . ferric gluconate (FERRLECIT/NULECIT) IV  125 mg Intravenous Q T,Th,Sa-HD  . heparin  5,000 Units Subcutaneous 3 times per day  . multivitamin  1 tablet Oral  QHS  . quiNINE  324 mg Oral Once per day on Tue Thu Sat  . sevelamer carbonate  1,600 mg Oral TID WC  . simvastatin  40 mg Oral q1800

## 2015-04-10 NOTE — Progress Notes (Signed)
Lab called with a critical value of 6.5 potassium. Weston SettleMartha Bergman PA-C has been informed and has ordered a 1K dialysate bath for 60 minutes to resolve hyperkalemia. Labs will be drawn shortly after HD to confirm new potassium level.

## 2015-04-10 NOTE — H&P (View-Only) (Signed)
[facility] [place] CONSULT NOTE   MRN : [id number]  Reason for Consult: wound over right AV graft site " exposed struts of a previously deployed stent in the graft portion of the HeRO which indicates removal of the entire hero graft/catheter portion of the device as it is theoretically all infected. " Referring Physician: Anand D Hongalgi, MD  History of Present Illness: 81 y/o female with ESRD.  She was admitted secondary to mentle status changes in the presence of a UTI.  More recently she had + cultures from arm AVGG 9/27 proteus mirabilis and 10/20 enterobacter cloace treated with Fortaz x 2 weeks - the last dose was 11/5 . She has been followed by [facility]. Her last procedure on her right brachial HERO graft was 10/7 by Dr. Schneir when an ulcerated section occurred at the site of a stent in the graft.  Revision of the PTFE portion and new graft was placed lateral to the ulcerated section.  She was seen in his office last week and started on Silvadene cream.  Of note was she just completed 2 weeks of Fortaz 11/5. Evaluation in the ED showed WBC 6.2 no diff, U/A turbid many bacteria, 21-50 RBC, neg nitrite, and CXR neg.  She does have a right HD catheter in place.     Other past medical history includes: hypertension managed with Norvasc dislipidemia managed with Zocor, and  DM.   She is not on any anticoagulants or antiplatelets other than 81 mg aspirin daily.   Current Facility-Administered Medications  Medication Dose Route Frequency Provider Last Rate Last Dose  . acetaminophen (TYLENOL) tablet 500 mg  500 mg Oral Q6H PRN Anand D Hongalgi, MD      . [START ON 04/10/2015] amLODipine (NORVASC) tablet 2.5 mg  2.5 mg Oral Once per day on Sun Mon Wed Fri Anand D Hongalgi, MD      . amLODipine (NORVASC) tablet 5 mg  5 mg Oral Once per day on Tue Thu Sat Anand D Hongalgi, MD      . aspirin chewable tablet 81 mg  81 mg Oral Daily Rondell A Smith, MD      . cinacalcet (SENSIPAR)  tablet 30 mg  30 mg Oral Q breakfast Rondell A Smith, MD      . dorzolamide-timolol (COSOPT) 22.3-6.8 MG/ML ophthalmic solution 1 drop  1 drop Both Eyes Daily Rondell A Smith, MD   1 drop at 04/09/15 1015  . doxercalciferol (HECTOROL) injection 3 mcg  3 mcg Intravenous Q T,Th,Sa-HD Martha Bergman, PA-C      . escitalopram (LEXAPRO) tablet 5 mg  5 mg Oral Daily Rondell A Smith, MD      . famotidine (PEPCID) tablet 20 mg  20 mg Oral Daily Rondell A Smith, MD      . ferric gluconate (NULECIT) 125 mg in sodium chloride 0.9 % 100 mL IVPB  125 mg Intravenous Q T,Th,Sa-HD Martha Bergman, PA-C      . heparin injection 5,000 Units  5,000 Units Subcutaneous 3 times per day Rondell A Smith, MD   5,000 Units at 04/09/15 0700  . HYDROcodone-acetaminophen (NORCO/VICODIN) 5-325 MG per tablet 1 tablet  1 tablet Oral Q6H PRN Anand D Hongalgi, MD      . loperamide (IMODIUM) capsule 2 mg  2 mg Oral PRN Rondell A Smith, MD      . multivitamin (RENA-VIT) tablet 1 tablet  1 tablet Oral QHS Rondell A Smith, MD      .   ondansetron (ZOFRAN) tablet 4 mg  4 mg Oral Q6H PRN Rondell A Smith, MD       Or  . ondansetron (ZOFRAN) injection 4 mg  4 mg Intravenous Q6H PRN Rondell A Smith, MD      . promethazine (PHENERGAN) tablet 12.5 mg  12.5 mg Oral Q6H PRN Rondell A Smith, MD      . quiNINE (QUALAQUIN) capsule 324 mg  324 mg Oral Once per day on Tue Thu Sat Rondell A Smith, MD      . sevelamer carbonate (RENVELA) tablet 1,600 mg  1,600 mg Oral TID WC Martha Bergman, PA-C   1,600 mg at 04/09/15 1200  . simvastatin (ZOCOR) tablet 40 mg  40 mg Oral q1800 Rondell A Smith, MD        Pt meds include: Statin :Yes Betablocker: No ASA: Yes Other anticoagulants/antiplatelets:   Past Medical History  Diagnosis Date  . Cough   . Diabetes mellitus     type 2  . GERD (gastroesophageal reflux disease)   . Hypertension   . Bronchitis   . Osteoarthritis   . Dyslipidemia     remote hx/notes 10/06/2009  . ESRD (end stage renal  disease) on dialysis (HCC)     Dr Coladonato/Powell  . Depression     lexapro  . Staphylococcus aureus bacteremia with sepsis (HCC)     thought from HD catheter  . Anxiety     Past Surgical History  Procedure Laterality Date  . Eye surgery      Cataract extraction  . Thrombectomy and revision of arterioventous (av) goretex  graft Left 12/2004; 01/2006; 03/12/2009    forearmnotes 10/13/2010; forearmnotes 10/13/2010; upper arm/notes 03/19/2009  . Abdominal hysterectomy      remote hx/notes 10/06/2009  . Vitrectomy Left 03/2001    with membrane peel/notes 10/13/2010  . Arteriovenous graft placement Left 07/2004    forearm/notes 10/13/2010  . Arteriovenous graft placement Left 03/2006    upper armnotes 10/13/2010  . Peripheral vascular catheterization Right 02/18/2015    Procedure: A/V Shuntogram/Fistulagram;  Surgeon: [name] S [name], MD;  Location: [facility];  Service: Cardiovascular;  Laterality: Right;  . Peripheral vascular catheterization N/A 02/18/2015    Procedure: A/V Shunt Intervention;  Surgeon: [name] S [name], MD;  Location: [facility];  Service: Cardiovascular;  Laterality: N/A;  . Peripheral vascular catheterization N/A 03/06/2015    Procedure: Dialysis/Perma Catheter Insertion;  Surgeon: [name] S [name], MD;  Location: [facility];  Service: Cardiovascular;  Laterality: N/A;  . Revision of arteriovenous goretex graft Right 03/08/2015    Procedure: REVISION OF ARTERIOVENOUS GORETEX GRAFT;  Surgeon: Gregory G Schnier, MD;  Location: [facility] ORS;  Service: Vascular;  Laterality: Right;    Social History Social History  Substance Use Topics  . Smoking status: Never Smoker   . Smokeless tobacco: None  . Alcohol Use: No    Family History Family History  Problem Relation Age of Onset  . Hypertension Mother     No Known Allergies   REVIEW OF SYSTEMS  General: [ ] Weight loss, [ ] Fever, [ ] chills Neurologic: [ ] Dizziness, [ ] Blackouts, [ ] Seizure [ ]  Stroke, [ ] "Mini stroke", [ ] Slurred speech, [ ] Temporary blindness; [ ] weakness in arms or legs, [ ] Hoarseness [ ] Dysphagia Cardiac: [ ] Chest pain/pressure, [ ] Shortness of breath at rest [ ] Shortness of breath with exertion, [ ] Atrial fibrillation or   irregular heartbeat  Vascular: [ ] Pain in legs with walking, [ ] Pain in legs at rest, [ ] Pain in legs at night,  [ ] Non-healing ulcer, [ ] Blood clot in vein/DVT,   Pulmonary: [ ] Home oxygen, [ ] Productive cough, [ ] Coughing up blood, [ ] Asthma,  [ ] Wheezing [ ] COPD Musculoskeletal:  [ ] Arthritis, [ ] Low back pain, [ ] Joint pain Hematologic: [ ] Easy Bruising, [ ] Anemia; [ ] Hepatitis Gastrointestinal: [ ] Blood in stool, [ ] Gastroesophageal Reflux/heartburn, Urinary: [ ] chronic Kidney disease, [ ] on HD - [ ] MWF or [ ] TTHS, [ ] Burning with urination, [ ] Difficulty urinating Skin: [ ] Rashes, [ ] Wounds Psychological: [ ] Anxiety, [ ] Depression  Physical Examination Filed Vitals:   04/09/15 0045 04/09/15 0330 04/09/15 0519 04/09/15 1034  BP:  102/75 138/66 160/66  Pulse: 79 83 84 93  Temp:   99.9 F (37.7 C) 98.8 F (37.1 C)  TempSrc:   Oral Oral  Resp:   20 18  Height:   4' 10" (1.473 m)   Weight:   140 lb (63.504 kg)   SpO2: 97% 100% 100% 93%   Body mass index is 29.27 kg/(m^2).  General:  WDWN in NAD HENT: WNL Eyes: Pupils equal Pulmonary: normal non-labored breathing , without Rales, rhonchi,  wheezing Cardiac: RRR, without  Murmurs, rubs or gallops; No carotid bruits Abdomen: soft, NT, no masses Skin: right medial upper arm ulcer over old graft with visible metal stent.   No frank purulent drainage.  The skin is separated about 1.5 mm.     Vascular Exam/Pulses:Palpable radial pulses bilaterally.  She wears a glove on her right hand and has for years sue to cold sensation.  No sign of true steal.  Grip 5/5 right UE.   Musculoskeletal: no muscle wasting or atrophy; no edema  Neurologic: A&O X  3; Appropriate Affect ;  SENSATION: grossly normal; MOTOR FUNCTION: grossly 5/5 Symmetric Speech is fluent/normal   Significant Diagnostic Studies: CBC Lab Results  Component Value Date   WBC 6.2 04/08/2015   HGB 11.4* 04/08/2015   HCT 36.1 04/08/2015   MCV 90.9 04/08/2015   PLT 121* 04/08/2015    BMET    Component Value Date/Time   NA 138 04/08/2015 1945   K 5.4* 04/08/2015 1945   CL 99* 04/08/2015 1945   CO2 26 04/08/2015 1945   GLUCOSE 93 04/08/2015 1945   GLUCOSE 111 02/07/2009   GLUCOSE 246* 05/28/2006 1229   BUN 41* 04/08/2015 1945   CREATININE 8.72* 04/08/2015 1945   CALCIUM 9.6 04/08/2015 1945   CALCIUM 9.6 07/18/2012 1051   GFRNONAA 4* 04/08/2015 1945   GFRAA 4* 04/08/2015 1945   Estimated Creatinine Clearance: 3.8 mL/min (by C-G formula based on Cr of 8.72).  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 Right thigh HD catheter UTI with acute encephalopathy currently on IV Rocephin  Plan OR tomorrow for removal of medial ligated PTFE section of great.    COLLINS, EMMA MAUREEN 04/09/2015 2:17 PM  I agree with the above.  I have seen and evaluated the patient.  She   has a history of a right-sided hero graft.  This was recently revised and now the defunctionalized graft is exposed.  She was admitted with altered mental status and sepsis.  This is a potential source and therefore needs to be removed.  This will be scheduled for tomorrow.  She will be nothing by mouth after midnight.  Wells Nicholas Ossa 

## 2015-04-10 NOTE — Anesthesia Procedure Notes (Signed)
Procedure Name: Intubation Date/Time: 04/10/2015 11:17 AM Performed by: Jacquiline Doe A Pre-anesthesia Checklist: Patient identified, Emergency Drugs available, Suction available, Timeout performed and Patient being monitored Patient Re-evaluated:Patient Re-evaluated prior to inductionOxygen Delivery Method: Circle system utilized Preoxygenation: Pre-oxygenation with 100% oxygen Intubation Type: IV induction and Cricoid Pressure applied Ventilation: Mask ventilation without difficulty Laryngoscope Size: Mac and 4 Grade View: Grade I Tube type: Oral Tube size: 7.5 mm Number of attempts: 1 Airway Equipment and Method: Stylet and LTA kit utilized Placement Confirmation: ETT inserted through vocal cords under direct vision,  breath sounds checked- equal and bilateral and positive ETCO2 Secured at: 21 cm Tube secured with: Tape Dental Injury: Teeth and Oropharynx as per pre-operative assessment

## 2015-04-10 NOTE — Progress Notes (Signed)
Physical Therapy Cancellation Note    Patient taken to OR for procedure. Will check back for PT evaluation.  [address][name] [place], [place] [phone]   04/10/2015 1359

## 2015-04-10 NOTE — Progress Notes (Signed)
K post procedure up to 6.8 . Hgb 9.6  Expect Hgb may drop more overnight as labs equilibrate.  Plan emergent HD s/p new left fem cath and right infected HERO resection. No heparin.  Repeat HD tomorrow to get back on schedule.  MBergman, PA-C

## 2015-04-10 NOTE — Anesthesia Preprocedure Evaluation (Addendum)
Anesthesia Evaluation  Patient identified by MRN, date of birth, ID band Patient awake    Reviewed: Allergy & Precautions, NPO status , Patient's Chart, lab work & pertinent test results  History of Anesthesia Complications Negative for: history of anesthetic complications  Airway Mallampati: II       Dental  (+) Teeth Intact   Pulmonary neg pulmonary ROS,    breath sounds clear to auscultation       Cardiovascular hypertension,  Rhythm:Regular Rate:Normal     Neuro/Psych negative neurological ROS     GI/Hepatic GERD  ,  Endo/Other  diabetes  Renal/GU ESRFRenal disease     Musculoskeletal  (+) Arthritis ,   Abdominal   Peds  Hematology  (+) anemia ,   Anesthesia Other Findings   Reproductive/Obstetrics                            Anesthesia Physical Anesthesia Plan  ASA: III  Anesthesia Plan: General   Post-op Pain Management:    Induction: Intravenous  Airway Management Planned: LMA and Oral ETT  Additional Equipment:   Intra-op Plan:   Post-operative Plan: Extubation in OR  Informed Consent: I have reviewed the patients History and Physical, chart, labs and discussed the procedure including the risks, benefits and alternatives for the proposed anesthesia with the patient or authorized representative who has indicated his/her understanding and acceptance.   Dental advisory given  Plan Discussed with: CRNA and Surgeon  Anesthesia Plan Comments:        Anesthesia Quick Evaluation

## 2015-04-10 NOTE — Transfer of Care (Signed)
Immediate Anesthesia Transfer of Care Note  Patient: [name]  Procedure(s) Performed: Procedure(s): REMOVAL OF RIGHT ARM  ARTERIOVENOUS GORETEX GRAFT (AVGG) (Right) INSERTION OF DIALYSIS CATHETER LEFT GROIN (Left) REMOVAL OF RIGHT GROIN DIALYSIS CATHETER (Right)  Patient Location: PACU  Anesthesia Type:General  Level of Consciousness: awake, sedated, patient cooperative and responds to stimulation  Airway & Oxygen Therapy: Patient Spontanous Breathing and Patient connected to nasal cannula oxygen  Post-op Assessment: Report given to RN and Post -op Vital signs reviewed and stable  Post vital signs: Reviewed and stable  Last Vitals:  Filed Vitals:   04/10/15 1356  BP:   Pulse:   Temp: 36.8 C  Resp:     Complications: No apparent anesthesia complications

## 2015-04-10 NOTE — Op Note (Signed)
OPERATIVE NOTE  PROCEDURE: 1.  Right femoral tunneled dialysis catheter removal 2.  Left femoral vein tunneled dialysis catheter placement 3.  Left femoral vein cannulation under ultrasound guidance 4.  Removal of right arteriovenous graft segment  PRE-OPERATIVE DIAGNOSIS: end-stage renal failure  POST-OPERATIVE DIAGNOSIS: same as above  SURGEON: Leonides SakeBrian Chen, MD  ANESTHESIA: general  ESTIMATED BLOOD LOSS: 30 cc  FINDING(S): 1.  No obvious infection in right tunneled dialysis catheter site 2.  Proximal inferior vena cava stenosis: tips of catheter unable to advance into the right atrium 3.  Tips of the catheter in the inferior vena cava on fluoroscopy 4.  Obvious stent strut protruding through mid-segment of prior upper arm arteriovenous graft  5.  Necrotic skin adjacent to stent erosion 6.  Frank pus from lumen of proximal graft 7.  Multiple covered stents removed from arteriovenous graft   SPECIMEN(S):  Right femoral tunneled dialysis catheter tip, anaerobic and aerobic cultures from graft lumen  INDICATIONS:   [name] is a 81 y.o. female who presents with end stage renal disease and likely eroded arteriovenous graft in right arm and possible infected right groin tunneled dialysis catheter.  The patient presents for removal of right femoral tunneled dialysis catheter, left femoral tunneled dialysis catheter placement, and removal of prior right upper arm arteriovenous graft segment.  The patient is aware the risks of the procedure include but are not limited to: bleeding, infection, central venous injury, pneumothorax, possible venous stenosis, possible malpositioning in the venous system, and possible infections related to long-term catheter presence.  The patient was aware of these risks and agreed to proceed.  DESCRIPTION: After written full informed consent was obtained from the patient, the patient was taken back to the operating room.  Prior to induction, the  patient was given IV antibiotics.  I turned my attention first to the right groin.  I transected the prior sutures securing the catheter.  I then dissected out the cuff of the catheter.  During this process, it became evident that the right groin cannulation site had also opened previously.  I removed the catheter and held pressure at the groin cannulation site.  I transected the tip of the catheter and passed it off as a specimen.  I held pressure to the right groin for 10 minutes.  The bleeding stopped..    At this point, the patient was prepped and draped in the standard fashion for a femoral vein tunneled dialysis catheter placement.  Under ultrasound guidance, the left femoral vein was cannulated with the 18 gauge needle.  A J-wire was then placed into the right atrium under fluroscopic guidance.  The wire was then secured in place with a clamp to the drapes.  I then made stab incisions at the cannulation and exit sites.  I dissected from the exit site to the cannulation site with a metal dissected and dilated the subcutaneous tunnel with a plastic dilator.  The wire was then unclamped and I removed the needle.  The skin tract and venotomy was dilated serially with dilators.  Finally, the dilator-sheath was placed under fluroscopic guidance into the left iliac vein.  The dilator and wire were removed.  A 55 cm Diatek catheter was placed under fluoroscopic guidance into the right atrium.  The sheath was broken and peeled away while holding the catheter cuff at the level of the skin.  The back of the Diatek was connected to the metal dilator and delivered through the subcutaneous tunnel.  The back end of this catheter was transected, revealing the two lumens of this catheter.  The ports were docked onto these two lumens.  The catheter hub was then screwed into place.  Each port was tested by aspirating and flushing.  No resistance was noted.  Each port was then thoroughly flushed with heparinized saline.  On  completion fluoroscopy, it was evident that the tips of the catheter were not in the atrium.  I tried to manipulate the catheter over a J-wire but the catheter would not advance into the right atrium, suggesting a proximal inferior vena cava stenosis.  I pulled the catheter distal so the distal tip was in proximal inferior vena cava.  This resulted in the cuff being exposed, so I felt an immediate exchange was necessary.  I replaced the wire through a port and then removed the catheter.  I clamped the wire in groin cannulation incision.  I delivered the back end of the wire through incision.  I loaded a dilator-sheath over the wire into the left iliac vein.  I made another incision more distally in the left thigh to accommodate the extra length needed.  I dissected from this new exit incision to the left groin cannulation incision.  The tract was dilated bluntly.  I loaded the 55-cm tunneled dialysis catheter over the wire into the proximal inferior vena cava.   I removed the wire and the catheter was docked on the metal dissector.  The catheter was delivered through the subcutaneous tunnel.  I then transected the back end of the catheter.  I loaded the two ports onto this end of the catheter.  Each port was aspirated and flushed to verify function of each port.  Each port was loaded with heparinized saline.  Complete fluoroscopy demonstrated both catheters in the proximal inferior vena cava.  The catheter was secured in placed with two interrupted stitches of 3-0 Nylon tied to the catheter.  The cannulation incision was closed with a U-stitch of 4-0 Monocryl.  The cannulation and new exit incisions were cleaned and sterile bandages applied.  Each port was then loaded with concentrated heparin (1000 Units/mL) at the manufacturer recommended volumes to each port.  Sterile caps were applied to each port.  On completion fluoroscopy, the tips of the catheter were in the right atrium, and there was no evidence of  pneumothorax.  The right groin incision and thigh exit incision were dressed with sterile bandages.  At this point, the drapes were removed and the patient repositioned for a right arm access procedure.  He was reprepped and redraped.  I turned my attention to the obvious skin erosion.  Frank stent strut was evident in the erosion.  I made an longitudinal incision proximal and distal to this erosion.  Using electrocautery, I dissected proximally and distally.  There was frankly purulent draining from the proximal graft, so I felt removal of this entire residual graft was necessary.  I extended the incision proximally and distally.  Using electrocautery, I was able to fully mobilize this graft.  It was evident that this entire graft segment was filled with stents.  I resected the entire graft.  Proximally, I placed a 2-0 silk around the residual vein and also placed a suture ligature with 2-0 silk as reinforcement.  I controlled all bleeding points with electrocautery.  I then washed out this upper arm wound with 3 L of sterile salilne with a Pulsavac.   I packed the incision with thrombin and gelfoam.  After a few minutes, I removed the gelofoam.  There was no further active bleeding.  I stapled this incision proximally and distally, leaving a 3 cm open segment in the middle.  I obtained 1/2 inch Iodoform gauze and packed it into the entire incision.  The right arm was dressed with sterile 4x4 guaze, held in place with Kerlix wrapped around the upper arm.   COMPLICATIONS: none  CONDITION: stable   Leonides Sake, MD [facility] Office: [phone] Pager: [phone]  04/10/2015, 12:13 PM

## 2015-04-10 NOTE — Progress Notes (Signed)
Patient became unresponsive/ hypotensive with 275 cc of fluid removed on HD . Blood pressure result of 68/45. Normal saline given 150cc bolus. Patient blood pressure improved to 85/45 with patient still unresponsive. Code blue called. Patient given additional 350cc of normal saline. Patient has not lost her pulse through out episode. Code team arrived and is now at bedside. Patient alert and responds with normal saline given. Treatment will be resumed with no fluid removal per Dr Deterding due to elevated potassium of 6.5.

## 2015-04-10 NOTE — Progress Notes (Signed)
Attempted report 

## 2015-04-10 NOTE — Progress Notes (Addendum)
PROGRESS NOTE    [name] ZOX:[id number] DOB: 18-Aug-1928 DOA: 04/08/2015 PCP: Eustaquio Boyden, MD  HPI/Brief narrative 81 year old female patient with history of ESRD on TTS HD, HTN, diet-controlled DM 2, HLD, GERD, prior staph aureus bacteremia related to HD catheter, anxiety & depression, presented to [facility] ED on 04/09/15 with altered mental status (apparently pretty lucid and self-sufficient at baseline) off 1-2 days duration. Per family, she gets like this when she has a UTI and family also noted infected right upper extremity graft for which she has been following at [facility] not on antibiotics and the wound continues to have foul-smelling drainage. Admitted for acute encephalopathy related to UTI, infected right upper extremity graft. Nephrology consulted.    Assessment/Plan:  Recurrent UTI - Started on IV Rocephin pending culture results, will continue. Apparently does not make much urine.  RUE AV graft infection - Vascular surgeon consulted - pt went to OR 04/10/15 for removal of medial ligated PTFE section  Acute encephalopathy - Secondary to acute infections most likely. Treat underlying condition and monitor. - CT head without acute findings.  ESRD on TTS HD/mild hyperkalemia - Management per nephrology-consulted.  Type II DM with renal complications - Diet controlled. Last A1c: 5.3 - Monitor CBGs.  Hyperlipidemia - Continue statins  GERD - Continue PPI  Anemia and thrombocytopenia - Follow CBCs  Essential hypertension - Controlled  Stable calcified right orbital mass, seen on CT head - Unclear etiology. Outpatient follow-up.   DVT prophylaxis: Subcutaneous heparin Code Status: full  Family Communication: none at bedside Disposition Plan: DC home when medically stable   Consultants:  Nephrology  Vascular surgery - have been called  Procedures:   None  Antibiotics:   IV Rocephin 11/7 >   Subjective: Patient had just returned from  surgery and son reports she was somewhat confused.   Objective: Filed Vitals:   04/10/15 1556 04/10/15 1600 04/10/15 1611 04/10/15 1633  BP: 124/53  120/55 125/31  Pulse: 80 81 87 81  Temp:   97.7 F (36.5 C) 97.5 F (36.4 C)  TempSrc:    Oral  Resp: Height:      Weight:      SpO2: 98% 100% 100% 98%    Intake/Output Summary (Last 24 hours) at 04/10/15 1729 Last data filed at 04/10/15 1600  Gross per 24 hour  Intake    570 ml  Output    628 ml  Net    -58 ml   Filed Weights   04/09/15 0519 04/09/15 1410 04/09/15 1800  Weight: 63.504 kg (140 lb) 58.4 kg (128 lb 12 oz) 56.1 kg (123 lb 10.9 oz)     Exam:  General exam:  Pleasant elderly female lying comfortably supine in bed. In NAD Respiratory system: Clear. No increased work of breathing. Cardiovascular system: S1 & S2 heard, RRR. No JVD, murmurs, gallops, clicks or pedal edema.  Gastrointestinal system: Abdomen is nondistended, soft and nontender.  Central nervous system: Alert and oriented only to self . No facial asymmetry Extremities: Symmetric 5 x 5 power. Guaze over right arm, no active bleeding   Data Reviewed: Basic Metabolic Panel:  Recent Labs Lab 04/08/15 1945 04/09/15 1410 04/10/15 0410 04/10/15 1450  NA 138 139 137 138  K 5.4* >7.5* 5.2* 6.8*  CL 99* 100* 100* 105  CO2 17*  GLUCOSE 93 103* 101* 126*  BUN 41* 46* 34* 39*  CREATININE 8.72* 9.96* 8.45* 9.08*  CALCIUM 9.6 9.2  8.9 8.6*  PHOS  --  6.2*  --   --    Liver Function Tests:  Recent Labs Lab 04/08/15 1945 04/09/15 1410  AST 21  --   ALT 10*  --   ALKPHOS 83  --   BILITOT 1.0  --   PROT 7.9  --   ALBUMIN 3.1* 3.0*   No results for input(s): LIPASE, AMYLASE in the last 168 hours. No results for input(s): AMMONIA in the last 168 hours. CBC:  Recent Labs Lab 04/08/15 1945 04/09/15 1410 04/10/15 0410 04/10/15 1450  WBC 6.2 5.1 5.4 6.7  HGB 11.4* 11.0* 9.9* 9.6*  HCT 36.1 34.2* 31.0* 28.6*  MCV 90.9  89.1 88.8 86.1  PLT 121* 121* 105* 95*   Cardiac Enzymes: No results for input(s): CKTOTAL, CKMB, CKMBINDEX, TROPONINI in the last 168 hours. BNP (last 3 results) No results for input(s): PROBNP in the last 8760 hours. CBG:  Recent Labs Lab 04/08/15 2128 04/08/15 2238 04/09/15 1807  GLUCAP 84 86 114*    Recent Results (from the past 240 hour(s))  Wound culture     Status: None (Preliminary result)   Collection Time: 04/09/15 10:54 AM  Result Value Ref Range Status   Specimen Description WOUND ARM RIGHT  Final   Special Requests Immunocompromised  Final   Gram Stain   Final    NO WBC SEEN NO SQUAMOUS EPITHELIAL CELLS SEEN NO ORGANISMS SEEN Performed at [facility]    Culture   Final    NO GROWTH 1 DAY Performed at [facility]    Report Status PENDING  Incomplete  Culture, blood (routine x 2)     Status: None (Preliminary result)   Collection Time: 04/09/15  2:10 PM  Result Value Ref Range Status   Specimen Description BLOOD HEMODIALYSIS CATHETER  Final   Special Requests BOTTLES DRAWN AEROBIC AND ANAEROBIC 10CC  Final   Culture NO GROWTH < 24 HOURS  Final   Report Status PENDING  Incomplete  Culture, blood (routine x 2)     Status: None (Preliminary result)   Collection Time: 04/09/15  2:43 PM  Result Value Ref Range Status   Specimen Description BLOOD HEMODIALYSIS CATHETER  Final   Special Requests BOTTLES DRAWN AEROBIC AND ANAEROBIC 10CC  Final   Culture NO GROWTH < 24 HOURS  Final   Report Status PENDING  Incomplete  Surgical pcr screen     Status: None   Collection Time: 04/09/15 11:38 PM  Result Value Ref Range Status   MRSA, PCR NEGATIVE NEGATIVE Final   Staphylococcus aureus NEGATIVE NEGATIVE Final    Comment:        The Xpert SA Assay (FDA approved for NASAL specimens in patients over 79 years of age), is one component of a comprehensive surveillance program.  Test performance has been validated by [facility] for patients  greater than or equal to 79 year old. It is not intended to diagnose infection nor to guide or monitor treatment.           Studies: Dg Chest 2 View  04/08/2015  CLINICAL DATA:  Altered mental status. EXAM: CHEST  2 VIEW COMPARISON:  11/16/2014 FINDINGS: Unchanged large-bore right central venous catheter, tip at the atrial caval junction. Cardiomegaly is unchanged. No consolidation, pleural effusion or pneumothorax. No pulmonary edema. Vascular stent seen in the right arm. The bones are under mineralized. IMPRESSION: Stable cardiomegaly.  No acute pulmonary process. Electronically Signed   By: Rubye OaksMelanie  Ehinger  M.D.   On: 04/08/2015 23:46   Ct Head Wo Contrast  04/08/2015  CLINICAL DATA:  Confusion and disorientation today. EXAM: CT HEAD WITHOUT CONTRAST TECHNIQUE: Contiguous axial images were obtained from the base of the skull through the vertex without intravenous contrast. COMPARISON:  11/16/2014 FINDINGS: Stable age related cerebral atrophy, ventriculomegaly and periventricular white matter disease. No extra-axial fluid collections are identified. No CT findings for acute hemispheric infarction or intracranial hemorrhage. No mass lesions. The brainstem and cerebellum are normal. Stable calcified right orbital mass. No skull fracture or bone lesion. The paranasal sinuses are grossly clear. Mild Stable left-sided ethmoid disease. Remote blow in type fracture of the right orbit is noted. IMPRESSION: 1. Stable age related cerebral atrophy, ventriculomegaly and periventricular white matter disease. No acute intracranial findings. 2. Stable calcified right orbital mass. 3. No acute bony findings. Electronically Signed   By: Rudie Meyer M.D.   On: 04/08/2015 23:41   Dg Chest Port 1 View  04/10/2015  CLINICAL DATA:  81 year old female status post dialysis catheter placement in the left groin. EXAM: PORTABLE CHEST 1 VIEW COMPARISON:  Chest x-ray 04/08/2015. FINDINGS: New central venous catheter  noted from IVC approach with tips projecting over the right lobe of the liver, one medially, and the other laterally, presumably within right hepatic vein branches (but technically indeterminate on today's examination). Regardless, these are not located in the right heart at this time, and one of them is clearly outside of the IVC (the medial one may be within the IVC). Old right-sided subclavian catheter with tip terminating in the superior aspect of the right atrium, similar to the prior examination. Lung volumes are low. No pneumothorax. Mild crowding of the pulmonary vasculature, without frank pulmonary edema. No pleural effusion. Heart size is mildly enlarged. The patient is rotated to the left on today's exam, resulting in distortion of the mediastinal contours and reduced diagnostic sensitivity and specificity for mediastinal pathology. Atherosclerosis in the thoracic aorta. IMPRESSION: 1. Support apparatus, as above. Newly placed dialysis catheter tips appear likely located within hepatic vein branches in the liver. Clinical correlation is suggested. 2. Low lung volumes without radiographic evidence of acute cardiopulmonary disease. 3. Mild cardiomegaly. 4. Atherosclerosis. These results will be called to the ordering clinician or representative by the Radiologist Assistant, and communication documented in the PACS or zVision Dashboard. Electronically Signed   By: Trudie Reed M.D.   On: 04/10/2015 15:13   Dg Fluoro Guide Cv Line-no Report  04/10/2015  CLINICAL DATA:  FLOURO GUIDE CV LINE Fluoroscopy was utilized by the requesting physician.  No radiographic interpretation.        Scheduled Meds: . amLODipine  2.5 mg Oral Once per day on Sun Mon Wed Fri  . amLODipine  5 mg Oral Once per day on Tue Thu Sat  . aspirin  81 mg Oral Daily  . cefTRIAXone (ROCEPHIN)  IV  2 g Intravenous Q24H  . cinacalcet  30 mg Oral Q breakfast  . [START ON 04/16/2015] darbepoetin (ARANESP) injection - DIALYSIS   150 mcg Intravenous Q Tue-HD  . darbepoetin (ARANESP) injection - DIALYSIS  150 mcg Intravenous Q Wed-HD  . dorzolamide-timolol  1 drop Both Eyes Daily  . doxercalciferol  3 mcg Intravenous Q T,Th,Sa-HD  . escitalopram  5 mg Oral Daily  . famotidine  20 mg Oral Daily  . ferric gluconate (FERRLECIT/NULECIT) IV  125 mg Intravenous Q T,Th,Sa-HD  . heparin  5,000 Units Subcutaneous 3 times per day  . multivitamin  1 tablet Oral QHS  . quiNINE  324 mg Oral Once per day on Tue Thu Sat  . sevelamer carbonate  1,600 mg Oral TID WC  . simvastatin  40 mg Oral q1800   Continuous Infusions: . sodium chloride Stopped (04/10/15 1345)    Principal Problem:   Recurrent urinary tract infection Active Problems:   Type 2 diabetes with nephropathy (HCC)   HLD (hyperlipidemia)   GERD   ESRD on hemodialysis (HCC)   ANEMIA, IRON DEFICIENCY, CHRONIC   Thrombocytopenia (HCC)   UTI (lower urinary tract infection)    Time spent: 35 minutes.    Penny Pia, MD, FACP, [facility]. [facility] Pager [phone]  If 7PM-7AM, please contact night-coverage [email or web address] Password [email or web address] 04/10/2015, 5:29 PM    LOS: 1 day     Patient had some transient hypotension while in dialysis. No chest compressions performed. Patient will be transferred to step down unit for closer monitoring. Blood pressure currently improved and normotensive. Patient opens eyes spontaneously. Family updated.  Will reassess next am.  Penny Pia

## 2015-04-10 NOTE — Interval H&P Note (Signed)
[facility]  History and Physical Update  The patient was interviewed and re-examined.  The patient's previous History and Physical has been reviewed and is unchanged from Dr. Myra GianottiBrabham consult except for: interval request by Nephrology to remove the right femoral tunneled dialysis catheter and place a left femoral tunneled dialysis catheter.  The planned procedure is: removed right upper arm old arteriovenous graft, removal of right femoral tunneled dialysis catheter, and placement of left femoral tunneled dialysis catheter.   Leonides SakeBrian Kato Wieczorek, MD [facility] [place] Office: [phone] Pager: [phone]  04/10/2015, 9:42 AM

## 2015-04-10 NOTE — Progress Notes (Signed)
K=6.8. PA Bard HerbertMarty Bergman notified. Pt. Will be dialyzed today.

## 2015-04-11 ENCOUNTER — Encounter (HOSPITAL_COMMUNITY): Payer: Self-pay | Admitting: Vascular Surgery

## 2015-04-11 DIAGNOSIS — A419 Sepsis, unspecified organism: Secondary | ICD-10-CM

## 2015-04-11 DIAGNOSIS — T827XXA Infection and inflammatory reaction due to other cardiac and vascular devices, implants and grafts, initial encounter: Secondary | ICD-10-CM

## 2015-04-11 DIAGNOSIS — N186 End stage renal disease: Secondary | ICD-10-CM

## 2015-04-11 DIAGNOSIS — D696 Thrombocytopenia, unspecified: Secondary | ICD-10-CM

## 2015-04-11 DIAGNOSIS — Z992 Dependence on renal dialysis: Secondary | ICD-10-CM

## 2015-04-11 DIAGNOSIS — T827XXD Infection and inflammatory reaction due to other cardiac and vascular devices, implants and grafts, subsequent encounter: Secondary | ICD-10-CM

## 2015-04-11 LAB — RENAL FUNCTION PANEL
ALBUMIN: 2.7 g/dL — AB (ref 3.5–5.0)
ANION GAP: 12 (ref 5–15)
BUN: 15 mg/dL (ref 6–20)
CALCIUM: 8.4 mg/dL — AB (ref 8.9–10.3)
CO2: 25 mmol/L (ref 22–32)
Chloride: 101 mmol/L (ref 101–111)
Creatinine, Ser: 5.06 mg/dL — ABNORMAL HIGH (ref 0.44–1.00)
GFR, EST AFRICAN AMERICAN: 8 mL/min — AB (ref >60)
GFR, EST NON AFRICAN AMERICAN: 7 mL/min — AB (ref >60)
Glucose, Bld: 109 mg/dL — ABNORMAL HIGH (ref 65–99)
PHOSPHORUS: 4.8 mg/dL — AB (ref 2.5–4.6)
Potassium: 4.7 mmol/L (ref 3.5–5.1)
SODIUM: 138 mmol/L (ref 135–145)

## 2015-04-11 LAB — GLUCOSE, CAPILLARY: Glucose-Capillary: 129 mg/dL — ABNORMAL HIGH (ref 65–99)

## 2015-04-11 LAB — TROPONIN I: TROPONIN I: 0.03 ng/mL (ref <0.031)

## 2015-04-11 LAB — WOUND CULTURE
Culture: NO GROWTH
Gram Stain: NONE SEEN

## 2015-04-11 LAB — CBC
HCT: 27.7 % — ABNORMAL LOW (ref 36.0–46.0)
HEMOGLOBIN: 8.7 g/dL — AB (ref 12.0–15.0)
MCH: 28.7 pg (ref 26.0–34.0)
MCHC: 31.4 g/dL (ref 30.0–36.0)
MCV: 91.4 fL (ref 78.0–100.0)
Platelets: 78 10*3/uL — ABNORMAL LOW (ref 150–400)
RBC: 3.03 MIL/uL — AB (ref 3.87–5.11)
RDW: 18.4 % — ABNORMAL HIGH (ref 11.5–15.5)
WBC: 5.8 10*3/uL (ref 4.0–10.5)

## 2015-04-11 LAB — PREPARE RBC (CROSSMATCH)

## 2015-04-11 LAB — MRSA PCR SCREENING: MRSA by PCR: NEGATIVE

## 2015-04-11 MED ORDER — VANCOMYCIN HCL 10 G IV SOLR
1250.0000 mg | Freq: Once | INTRAVENOUS | Status: AC
  Administered 2015-04-11: 1250 mg via INTRAVENOUS
  Filled 2015-04-11: qty 1250

## 2015-04-11 MED ORDER — ATORVASTATIN CALCIUM 20 MG PO TABS
20.0000 mg | ORAL_TABLET | Freq: Every day | ORAL | Status: DC
  Administered 2015-04-11 – 2015-04-14 (×4): 20 mg via ORAL
  Filled 2015-04-11 (×5): qty 1

## 2015-04-11 MED ORDER — LORAZEPAM 2 MG/ML IJ SOLN
0.5000 mg | Freq: Once | INTRAMUSCULAR | Status: AC
  Administered 2015-04-11: 0.5 mg via INTRAVENOUS

## 2015-04-11 MED ORDER — VANCOMYCIN HCL 500 MG IV SOLR
500.0000 mg | INTRAVENOUS | Status: DC
  Administered 2015-04-11 – 2015-04-13 (×2): 500 mg via INTRAVENOUS
  Filled 2015-04-11 (×5): qty 500

## 2015-04-11 MED ORDER — SODIUM CHLORIDE 0.9 % IV SOLN: Freq: Once | INTRAVENOUS | Status: DC

## 2015-04-11 MED ORDER — LORAZEPAM 2 MG/ML IJ SOLN
INTRAMUSCULAR | Status: AC
  Filled 2015-04-11: qty 1

## 2015-04-11 MED ORDER — ACETAMINOPHEN 650 MG RE SUPP
650.0000 mg | RECTAL | Status: DC | PRN
  Administered 2015-04-11: 650 mg via RECTAL
  Filled 2015-04-11: qty 1

## 2015-04-11 MED FILL — Thrombin For Soln Kit 20000 Unit: CUTANEOUS | Qty: 1 | Status: AC

## 2015-04-11 NOTE — Progress Notes (Signed)
ANTIBIOTIC CONSULT NOTE - INITIAL  Pharmacy Consult for vancomycin Indication: wound infection  No Known Allergies  Patient Measurements: Height:  (147.3 cm) Weight: 130 lb 4.7 oz (59.1 kg) IBW/kg (Calculated) : 40.9   Vital Signs: Temp: 100.2 F (37.9 C) (11/10 0801) Temp Source: Rectal (11/10 0552) BP: 130/95 mmHg (11/10 0800) Pulse Rate: 101 (11/10 0800) Intake/Output from previous day: 11/09 0701 - 11/10 0700 In: 472 [I.V.:472] Out: 100 [Blood:100] Intake/Output from this shift: Total I/O In: 120 [P.O.:120] Out: -   Labs:  Recent Labs  04/10/15 0410 04/10/15 1450 04/10/15 1727 04/11/15 0103  WBC 5.4 6.7  --  5.8  HGB 9.9* 9.6*  --  8.7*  PLT 105* 95*  --  78*  CREATININE 8.45* 9.08* 9.06* 5.06*   Estimated Creatinine Clearance: 6.3 mL/min (by C-G formula based on Cr of 5.06). No results for input(s): VANCOTROUGH, VANCOPEAK, VANCORANDOM, GENTTROUGH, GENTPEAK, GENTRANDOM, TOBRATROUGH, TOBRAPEAK, TOBRARND, AMIKACINPEAK, AMIKACINTROU, AMIKACIN in the last 72 hours.   Microbiology: Recent Results (from the past 720 hour(s))  Wound culture     Status: None   Collection Time: 04/09/15 10:54 AM  Result Value Ref Range Status   Specimen Description WOUND ARM RIGHT  Final   Special Requests Immunocompromised  Final   Gram Stain   Final    NO WBC SEEN NO SQUAMOUS EPITHELIAL CELLS SEEN NO ORGANISMS SEEN Performed at [facility]    Culture   Final    NO GROWTH 2 DAYS Performed at [facility]    Report Status 04/11/2015 FINAL  Final  Culture, blood (routine x 2)     Status: None (Preliminary result)   Collection Time: 04/09/15  2:10 PM  Result Value Ref Range Status   Specimen Description BLOOD HEMODIALYSIS CATHETER  Final   Special Requests BOTTLES DRAWN AEROBIC AND ANAEROBIC 10CC  Final   Culture NO GROWTH < 24 HOURS  Final   Report Status PENDING  Incomplete  Culture, blood (routine x 2)     Status: None (Preliminary result)   Collection Time: 04/09/15  2:43 PM  Result Value Ref Range Status   Specimen Description BLOOD HEMODIALYSIS CATHETER  Final   Special Requests BOTTLES DRAWN AEROBIC AND ANAEROBIC 10CC  Final   Culture NO GROWTH < 24 HOURS  Final   Report Status PENDING  Incomplete  Surgical pcr screen     Status: None   Collection Time: 04/09/15 11:38 PM  Result Value Ref Range Status   MRSA, PCR NEGATIVE NEGATIVE Final   Staphylococcus aureus NEGATIVE NEGATIVE Final    Comment:        The Xpert SA Assay (FDA approved for NASAL specimens in patients over 76 years of age), is one component of a comprehensive surveillance program.  Test performance has been validated by [facility] for patients greater than or equal to 27 year old. It is not intended to diagnose infection nor to guide or monitor treatment.   Cath Tip Culture     Status: None (Preliminary result)   Collection Time: 04/10/15 11:26 AM  Result Value Ref Range Status   Specimen Description CATH TIP  Final   Special Requests DIALYSIS CATH TIP SPEC A PT ON ROCEPHIN  Final   Culture NO GROWTH Performed at [facility]   Final   Report Status PENDING  Incomplete  Anaerobic culture     Status: None (Preliminary result)   Collection Time: 04/10/15 12:35 PM  Result Value Ref Range Status  Specimen Description WOUND ARM RIGHT  Final   Special Requests   Final    OPEN WOUND RIGHT UPPER ARM DIALYSIS CATH SITE SPEC B PT ON ROCEPHIN   Gram Stain   Final    MODERATE WBC PRESENT,BOTH PMN AND MONONUCLEAR NO SQUAMOUS EPITHELIAL CELLS SEEN NO ORGANISMS SEEN Performed at [facility]    Culture PENDING  Incomplete   Report Status PENDING  Incomplete  Wound culture     Status: None (Preliminary result)   Collection Time: 04/10/15 12:35 PM  Result Value Ref Range Status   Specimen Description WOUND ARM RIGHT  Final   Special Requests   Final    OPEN WOUND RIGHT UPPER ARM DIALYSIS CATH SITE SPEC B PT ON ROCEPHIN   Gram Stain    Final    MODERATE WBC PRESENT,BOTH PMN AND MONONUCLEAR NO SQUAMOUS EPITHELIAL CELLS SEEN NO ORGANISMS SEEN Performed at [facility]    Culture NO GROWTH Performed at [facility]   Final   Report Status PENDING  Incomplete  MRSA PCR Screening     Status: None   Collection Time: 04/11/15  1:39 AM  Result Value Ref Range Status   MRSA by PCR NEGATIVE NEGATIVE Final    Comment:        The GeneXpert MRSA Assay (FDA approved for NASAL specimens only), is one component of a comprehensive MRSA colonization surveillance program. It is not intended to diagnose MRSA infection nor to guide or monitor treatment for MRSA infections.     Medical History: Past Medical History  Diagnosis Date  . Cough   . Diabetes mellitus     type 2  . GERD (gastroesophageal reflux disease)   . Hypertension   . Bronchitis   . Osteoarthritis   . Dyslipidemia     remote hx/notes 10/06/2009  . ESRD (end stage renal disease) on dialysis [facility])     Dr Coladonato/[place]  . Depression     lexapro  . Staphylococcus aureus bacteremia with sepsis (HCC)     thought from HD catheter  . Anxiety     Medications:  Scheduled:  . sodium chloride   Intravenous Once  . amLODipine  2.5 mg Oral Once per day on Sun Mon Wed Fri  . amLODipine  5 mg Oral Once per day on Tue Thu Sat  . aspirin  81 mg Oral Daily  . atorvastatin  20 mg Oral q1800  . cefTRIAXone (ROCEPHIN)  IV  2 g Intravenous Q24H  . cinacalcet  30 mg Oral Q breakfast  . [START ON 04/18/2015] darbepoetin (ARANESP) injection - DIALYSIS  150 mcg Intravenous Q Thu-HD  . dorzolamide-timolol  1 drop Both Eyes Daily  . doxercalciferol  3 mcg Intravenous Q T,Th,Sa-HD  . escitalopram  5 mg Oral Daily  . famotidine  20 mg Oral Daily  . ferric gluconate (FERRLECIT/NULECIT) IV  125 mg Intravenous Q T,Th,Sa-HD  . heparin  5,000 Units Subcutaneous 3 times per day  . multivitamin  1 tablet Oral QHS  . quiNINE  324 mg Oral Once per day on Tue  Thu Sat  . sevelamer carbonate  1,600 mg Oral TID WC    Assessment: 81 yo female with UTI on empiric rocephin and also noted with R groin wound and infected RUE AVG infection to begin vancomycin. Patient with ESRD on on HD TTS and for HD today (plans for 4 hrs at 400 BFR).  11/8 rocephin>> 11/10 vanc>>  11/8 blood x2- ngtd 11/9  R arm wound- ngtd 11/9 HD cath tip- ngtd   Goal of Therapy:  Pre-HD vancomycin level: 15-25  Plan:  -Vancomycin 1250mg  IV x1 followed by 500mg  IV post HD -Will follow renal function, cultures and clinical progress  Harland Germanndrew Travarus Trudo, Pharm D 04/11/2015 10:52 AM

## 2015-04-11 NOTE — Care Management Note (Addendum)
Case Management Note  Patient Details  Name: [name] MRN: [id number] Date of Birth: [birth year]  Subjective/Objective:                 Admitted with AMS / UTI.  Hx of ESRD. PTA independent with ADL's. From home with family. Pt has DME: wheelchair, walker, cane, 3 in 1. 04/10/2015 -  s/p Right femoral tunneled dialysis catheter removal, Left femoral vein tunneled dialysis catheter placement, and Removal of right arteriovenous graft segment.  Action/Plan: Return to home when medically stable. CM to f/u with d/c needs.  Expected Discharge Date:                  Expected Discharge Plan:  Home/Self Care  In-House Referral:     Discharge planning Services  CM Consult  Post Acute Care Choice:    Choice offered to:     DME Arranged:    DME Agency:     HH Arranged:    HH Agency:     Status of Service:  In process, will continue to follow  Medicare Important Message Given:    Date Medicare IM Given:    Medicare IM give by:    Date Additional Medicare IM Given:    Additional Medicare Important Message give by:     If discussed at Long Length of Stay Meetings, dates discussed:    Additional Comments:      CM spoke with daughter(Michelle) regarding d/cplan.Pt lives with husband and daughter Daughter states plan is for mom to return home and family will provide care for mom 24/7 if needed.  [name] (Daughter)[phone], [name] (Spouse)  [phone]   Gae GallopCole, Chadric Kimberley ParagonHudson, ArizonaRN,BSN,CM [phone] 04/11/2015, 10:28 AM

## 2015-04-11 NOTE — Progress Notes (Addendum)
  Progress Note    04/11/2015 12:10 PM 1 Day Post-Op  Subjective:  Needed sitter overnight for confusion  Tm 100.1 now 98.9 HR 100's-110's ST 100's-140's systolic  (now 80's systolic) 40%95% RA  Filed Vitals:   04/11/15 1155  BP:   Pulse:   Temp: 98.9 F (37.2 C)  Resp:     Physical Exam: Incisions:  Bandage appears reinforced and bloody.  Staples in tact proximally and distally with open wound mid incision.  iodoform gauze in place.  No active bleeding from the wound.   CBC    Component Value Date/Time   WBC 5.8 04/11/2015 0103   RBC 3.03* 04/11/2015 0103   HGB 8.7* 04/11/2015 0103   HCT 27.7* 04/11/2015 0103   PLT 78* 04/11/2015 0103   MCV 91.4 04/11/2015 0103   MCH 28.7 04/11/2015 0103   MCHC 31.4 04/11/2015 0103   RDW 18.4* 04/11/2015 0103   LYMPHSABS 1.5 02/01/2015 1331   MONOABS 0.4 02/01/2015 1331   EOSABS 0.2 02/01/2015 1331   BASOSABS 0.0 02/01/2015 1331    BMET    Component Value Date/Time   NA 138 04/11/2015 0103   K 4.7 04/11/2015 0103   CL 101 04/11/2015 0103   CO2 25 04/11/2015 0103   GLUCOSE 109* 04/11/2015 0103   GLUCOSE 111 02/07/2009   GLUCOSE 246* 05/28/2006 1229   BUN 15 04/11/2015 0103   CREATININE 5.06* 04/11/2015 0103   CALCIUM 8.4* 04/11/2015 0103   CALCIUM 9.6 07/18/2012 1051   GFRNONAA 7* 04/11/2015 0103   GFRAA 8* 04/11/2015 0103    INR    Component Value Date/Time   INR 1.30 07/25/2013 1614     Intake/Output Summary (Last 24 hours) at 04/11/15 1210 Last data filed at 04/11/15 0858  Gross per 24 hour  Intake    392 ml  Output    100 ml  Net    292 ml    Wound Culture 04/10/15: Gram Stain MODERATE WBC PRESENT,BOTH PMN AND MONONUCLEAR  NO SQUAMOUS EPITHELIAL CELLS SEEN  NO ORGANISMS SEEN  [facility]       Culture NO GROWTH  [facility]formed at [facility]            Assessment:  81 y.o. female is s/p:  1. Right femoral tunneled dialysis catheter removal 2. Left femoral vein  tunneled dialysis catheter placement 3. Left femoral vein cannulation under ultrasound guidance 4. Removal of right arteriovenous graft segment  1 Day Post-Op  Plan: -dressing removed-it was saturated with blood, but no active bleeding now.   -Iodoform packing pulled back slightly  -wound bed looks okay-no organisms on gram stain and no growth on culture day 1. -continue Abx per primary team -will continue to back iodoform back slowly over the next few days. -HD per renal   Doreatha MassedSamantha Rhyne, PA-C [facility] [phone] 04/11/2015 12:10 PM  Addendum  I have independently interviewed and examined the patient, and I agree with the physician assistant's findings.  Currently no obvious evidence of infection of RUA HeRO graft-catheter, but would continue abx as a precaution, given presence of pus on intraoperative exam.  Hopefully can get the packing out by this weekend and start limited wet-to-dry dressing in this patient.    Leonides SakeBrian Chiyoko Torrico, MD [facility] [place] Office: [phone] Pager: [phone]  04/11/2015, 1:29 PM

## 2015-04-11 NOTE — Progress Notes (Addendum)
PROGRESS NOTE  [name] ZOX:[id number] DOB: [birth year] DOA: 04/08/2015 PCP: Eustaquio Boyden, MD   HPI/Brief narrative 81 year old female patient with history of ESRD on TTS HD, HTN, diet-controlled DM 2, HLD, GERD, prior staph aureus bacteremia related to HD catheter, anxiety & depression, presented to [facility] ED on 04/09/15 with altered mental status (apparently pretty lucid and self-sufficient at baseline) off 1-2 days duration. Per family, she gets like this when she has a UTI and family also noted infected right upper extremity graft for which she has been following at [facility] not on antibiotics and the wound continues to have foul-smelling drainage. Admitted for acute encephalopathy related to UTI, infected right upper extremity graft. Nephrology consulted.  Her last procedure on her right brachial HERO graft was 10/7 by Dr. Lorretta Harp when an ulcerated section was resected. She was seen in his office last week and started on Silvadene cream. The patient completed a two-week course of ceftazidime on 04/06/2015.  More recently she had + cultures from arm AVGG 9/27 proteus mirabilis and 10/20 enterobacter cloace treated with Elita Quick x 2 weeks - the last dose was 11/5     Assessment/Plan: Sepsis -Secondary to right upper extremity graft infection -04/10/2015--patient became unresponsive on HD--CODE BLUE called, but patient did not lose pulse  -Blood pressure improved with 500 mL saline  -Blood pressure remained stable -Repeat blood cultures 2 sets -Vancomycin added by nephrology  Recurrent UTI? - Started on IV Rocephin pending culture results. Apparently does not make much urine. -unfortunately no urine culture was sent  RUE AV graft infection - Apparently has been going on for at least 2 months. Has been seen at [facility] but not on antibiotics. Wound draining malodorous pus intermittently. - Appreciate Dr. Allena Katz, Nephrology: Recommended vascular surgery  consultation. -appreciate Drs. Chen/Brabham - Continue IV Rocephin for now. -Follow wound culture results--the patient was ready on ceftriaxone which may compromise the patient's surgical cultures -On 03/08/2015, the patient underwent Revision right brachial hero graft with resection of the ulcerated portion;  Revision of the PTFE portion and new graft was placed lateral to the ulcerated section -04/10/2015 right femoral tunneled dialysis catheter removed, placement of left femoral tunneled dialysis catheter, removal of right AV graft segment--frank pus was noted in the lumen of the proximal graft-There were also stent struts protruding through the mid segment of the right upper arm graft which were ultimately removed -The right upper extremity graft was removed in its entirety  Acute encephalopathy -Initially somnolent and obtunded--> much improved in the past 24 hours - Secondary to acute infections.  -Patient has underlying dementia - As per nursing, patient not cooperating with several aspects of care-refusing lab draws, dialysis etc. - CT head without acute findings.  Thrombocytopenia -This is chronic dating back to 07/30/2010 -Acute worsening secondary to infectious process -The patient's quinine is likely contributing to thrombocytopenia--d/c for now  ESRD on TTS HD/mild hyperkalemia - Management per nephrology-consulted.  Type II DM with renal complications - Diet controlled. Last A1c: 5.3 - Monitor CBGs.  Hyperlipidemia - Continue statins  GERD - Continue PPI  Anemia and thrombocytopenia - Follow CBCs  Essential hypertension - Controlled - question accuracy of BPs--wide variations - hold amlodipine on HD days for now  Stable calcified right orbital mass, seen on CT head - Unclear etiology. Outpatient follow-up. -07/23/2010 MRI orbits revealed right orbital soft tissue mass   DVT prophylaxis: Subcutaneous heparin Code Status: full  Family Communication: none at  bedside Disposition Plan: DC home when medically stable         Procedures/Studies: Dg Chest 2 View  04/08/2015  CLINICAL DATA:  Altered mental status. EXAM: CHEST  2 VIEW COMPARISON:  11/16/2014 FINDINGS: Unchanged large-bore right central venous catheter, tip at the atrial caval junction. Cardiomegaly is unchanged. No consolidation, pleural effusion or pneumothorax. No pulmonary edema. Vascular stent seen in the right arm. The bones are under mineralized. IMPRESSION: Stable cardiomegaly.  No acute pulmonary process. Electronically Signed   By: Rubye OaksMelanie  Ehinger M.D.   On: 04/08/2015 23:46   Ct Head Wo Contrast  04/08/2015  CLINICAL DATA:  Confusion and disorientation today. EXAM: CT HEAD WITHOUT CONTRAST TECHNIQUE: Contiguous axial images were obtained from the base of the skull through the vertex without intravenous contrast. COMPARISON:  11/16/2014 FINDINGS: Stable age related cerebral atrophy, ventriculomegaly and periventricular white matter disease. No extra-axial fluid collections are identified. No CT findings for acute hemispheric infarction or intracranial hemorrhage. No mass lesions. The brainstem and cerebellum are normal. Stable calcified right orbital mass. No skull fracture or bone lesion. The paranasal sinuses are grossly clear. Mild Stable left-sided ethmoid disease. Remote blow in type fracture of the right orbit is noted. IMPRESSION: 1. Stable age related cerebral atrophy, ventriculomegaly and periventricular white matter disease. No acute intracranial findings. 2. Stable calcified right orbital mass. 3. No acute bony findings. Electronically Signed   By: Rudie MeyerP.  Gallerani M.D.   On: 04/08/2015 23:41   Dg Chest Port 1 View  04/10/2015  CLINICAL DATA:  81 year old female status post dialysis catheter placement in the left groin. EXAM: PORTABLE CHEST 1 VIEW COMPARISON:  Chest x-ray 04/08/2015. FINDINGS: New central venous catheter noted from IVC approach with tips projecting over the  right lobe of the liver, one medially, and the other laterally, presumably within right hepatic vein branches (but technically indeterminate on today's examination). Regardless, these are not located in the right heart at this time, and one of them is clearly outside of the IVC (the medial one may be within the IVC). Old right-sided subclavian catheter with tip terminating in the superior aspect of the right atrium, similar to the prior examination. Lung volumes are low. No pneumothorax. Mild crowding of the pulmonary vasculature, without frank pulmonary edema. No pleural effusion. Heart size is mildly enlarged. The patient is rotated to the left on today's exam, resulting in distortion of the mediastinal contours and reduced diagnostic sensitivity and specificity for mediastinal pathology. Atherosclerosis in the thoracic aorta. IMPRESSION: 1. Support apparatus, as above. Newly placed dialysis catheter tips appear likely located within hepatic vein branches in the liver. Clinical correlation is suggested. 2. Low lung volumes without radiographic evidence of acute cardiopulmonary disease. 3. Mild cardiomegaly. 4. Atherosclerosis. These results will be called to the ordering clinician or representative by the Radiologist Assistant, and communication documented in the PACS or zVision Dashboard. Electronically Signed   By: Trudie Reedaniel  Entrikin M.D.   On: 04/10/2015 15:13   Dg Fluoro Guide Cv Line-no Report  04/10/2015  CLINICAL DATA:  FLOURO GUIDE CV LINE Fluoroscopy was utilized by the requesting physician.  No radiographic interpretation.         Subjective: The patient is more alert but is pleasantly confused. Denies any headache, chest pain, short of breath, abdominal pain. No reports of vomiting, started distress, uncontrolled pain. Tolerating diet.  Objective: Filed Vitals:   04/11/15 0002 04/11/15 0411 04/11/15 0437 04/11/15 0552  BP:   113/54   Pulse:   105  Temp: 99.1 F (37.3 C) 101.5 F (38.6  C) 101 F (38.3 C) 101 F (38.3 C)  TempSrc:  Oral Rectal Rectal  Resp:   18   Height:    (1.473 m)   Weight:   59.1 kg (130 lb 4.7 oz)   SpO2:   99%     Intake/Output Summary (Last 24 hours) at 04/11/15 0749 Last data filed at 04/11/15 0600  Gross per 24 hour  Intake    472 ml  Output    100 ml  Net    372 ml   Weight change: 0.2 kg (7.1 oz) Exam:   General:  Pt is alert, follows commands appropriately, not in acute distress  HEENT: No icterus, No thrush, No neck mass, [place]/AT  Cardiovascular: RRR, S1/S2, no rubs, no gallops  Respiratory: Bibasilar crackles. No wheeze  Abdomen: Soft/+BS, non tender, non distended, no guarding; no hepatosplenomegaly  Extremities: No edema, No lymphangitis, No petechiae, No rashes, no synovitis  Data Reviewed: Basic Metabolic Panel:  Recent Labs Lab 04/09/15 1410 04/10/15 0410 04/10/15 1450 04/10/15 1727 04/11/15 0103  NA 139 137 138 137 138  K >7.5* 5.2* 6.8* 6.5* 4.7  CL 100* 100* 105 101 101  CO2 27 25 17* 25 25  GLUCOSE 103* 101* 126* 138* 109*  BUN 46* 34* 39* 39* 15  CREATININE 9.96* 8.45* 9.08* 9.06* 5.06*  CALCIUM 9.2 8.9 8.6* 8.4* 8.4*  PHOS 6.2*  --   --   --  4.8*   Liver Function Tests:  Recent Labs Lab 04/08/15 1945 04/09/15 1410 04/11/15 0103  AST 21  --   --   ALT 10*  --   --   ALKPHOS 83  --   --   BILITOT 1.0  --   --   PROT 7.9  --   --   ALBUMIN 3.1* 3.0* 2.7*   No results for input(s): LIPASE, AMYLASE in the last 168 hours. No results for input(s): AMMONIA in the last 168 hours. CBC:  Recent Labs Lab 04/08/15 1945 04/09/15 1410 04/10/15 0410 04/10/15 1450 04/11/15 0103  WBC 6.2 5.1 5.4 6.7 5.8  HGB 11.4* 11.0* 9.9* 9.6* 8.7*  HCT 36.1 34.2* 31.0* 28.6* 27.7*  MCV 90.9 89.1 88.8 86.1 91.4  PLT 121* 121* 105* 95* 78*   Cardiac Enzymes:  Recent Labs Lab 04/11/15 0103  TROPONINI 0.03   BNP: Invalid input(s): POCBNP CBG:  Recent Labs Lab 04/08/15 2128 04/08/15 2238  04/09/15 1807  GLUCAP 84 86 114*    Recent Results (from the past 240 hour(s))  Wound culture     Status: None   Collection Time: 04/09/15 10:54 AM  Result Value Ref Range Status   Specimen Description WOUND ARM RIGHT  Final   Special Requests Immunocompromised  Final   Gram Stain   Final    NO WBC SEEN NO SQUAMOUS EPITHELIAL CELLS SEEN NO ORGANISMS SEEN Performed at [facility]    Culture   Final    NO GROWTH 2 DAYS Performed at [facility]    Report Status 04/11/2015 FINAL  Final  Culture, blood (routine x 2)     Status: None (Preliminary result)   Collection Time: 04/09/15  2:10 PM  Result Value Ref Range Status   Specimen Description BLOOD HEMODIALYSIS CATHETER  Final   Special Requests BOTTLES DRAWN AEROBIC AND ANAEROBIC 10CC  Final   Culture NO GROWTH < 24 HOURS  Final   Report Status PENDING  Incomplete  Culture, blood (routine x 2)     Status: None (Preliminary result)   Collection Time: 04/09/15  2:43 PM  Result Value Ref Range Status   Specimen Description BLOOD HEMODIALYSIS CATHETER  Final   Special Requests BOTTLES DRAWN AEROBIC AND ANAEROBIC 10CC  Final   Culture NO GROWTH < 24 HOURS  Final   Report Status PENDING  Incomplete  Surgical pcr screen     Status: None   Collection Time: 04/09/15 11:38 PM  Result Value Ref Range Status   MRSA, PCR NEGATIVE NEGATIVE Final   Staphylococcus aureus NEGATIVE NEGATIVE Final    Comment:        The Xpert SA Assay (FDA approved for NASAL specimens in patients over 33 years of age), is one component of a comprehensive surveillance program.  Test performance has been validated by [facility] for patients greater than or equal to 77 year old. It is not intended to diagnose infection nor to guide or monitor treatment.   Cath Tip Culture     Status: None (Preliminary result)   Collection Time: 04/10/15 11:26 AM  Result Value Ref Range Status   Specimen Description CATH TIP  Final   Special Requests  DIALYSIS CATH TIP SPEC A PT ON ROCEPHIN  Final   Culture NO GROWTH Performed at [facility]   Final   Report Status PENDING  Incomplete  Wound culture     Status: None (Preliminary result)   Collection Time: 04/10/15 12:35 PM  Result Value Ref Range Status   Specimen Description WOUND ARM RIGHT  Final   Special Requests   Final    OPEN WOUND RIGHT UPPER ARM DIALYSIS CATH SITE SPEC B PT ON ROCEPHIN   Gram Stain PENDING  Incomplete   Culture NO GROWTH Performed at [facility]   Final   Report Status PENDING  Incomplete  MRSA PCR Screening     Status: None   Collection Time: 04/11/15  1:39 AM  Result Value Ref Range Status   MRSA by PCR NEGATIVE NEGATIVE Final    Comment:        The GeneXpert MRSA Assay (FDA approved for NASAL specimens only), is one component of a comprehensive MRSA colonization surveillance program. It is not intended to diagnose MRSA infection nor to guide or monitor treatment for MRSA infections.      Scheduled Meds: . amLODipine  2.5 mg Oral Once per day on Sun Mon Wed Fri  . amLODipine  5 mg Oral Once per day on Tue Thu Sat  . aspirin  81 mg Oral Daily  . cefTRIAXone (ROCEPHIN)  IV  2 g Intravenous Q24H  . cinacalcet  30 mg Oral Q breakfast  . [START ON 04/18/2015] darbepoetin (ARANESP) injection - DIALYSIS  150 mcg Intravenous Q Thu-HD  . dorzolamide-timolol  1 drop Both Eyes Daily  . doxercalciferol  3 mcg Intravenous Q T,Th,Sa-HD  . escitalopram  5 mg Oral Daily  . famotidine  20 mg Oral Daily  . ferric gluconate (FERRLECIT/NULECIT) IV  125 mg Intravenous Q T,Th,Sa-HD  . heparin  5,000 Units Subcutaneous 3 times per day  . multivitamin  1 tablet Oral QHS  . quiNINE  324 mg Oral Once per day on Tue Thu Sat  . sevelamer carbonate  1,600 mg Oral TID WC  . simvastatin  40 mg Oral q1800   Continuous Infusions: . sodium chloride 10 mL/hr at 04/10/15 2248     Anik Wesch, DO  [facility] Pager  [phone]  If 7PM-7AM,  please contact night-coverage [email or web address] Password [email or web address] 04/11/2015, 7:49 AM   LOS: 2 days

## 2015-04-11 NOTE — Progress Notes (Signed)
PT Cancellation Note  Patient Details Name: [name] MRN: [id number] DOB: 02-23-1929   Cancelled Treatment:    Reason Eval/Treat Not Completed: Patient at procedure or test/unavailable.  Pt preparing to be transported to dialysis.  PT will continue to follow acutely.  Thank you for this order.  Michail JewelsAshley Parr PT, DPT [phone] Pager: [phone] 04/11/2015, 11:42 AM

## 2015-04-11 NOTE — Progress Notes (Signed)
UR COMPLETED  

## 2015-04-11 NOTE — Progress Notes (Addendum)
Patient ID: [name], female   DOB: 1929/01/18, 81 y.o.   MRN: [id number]  [facility] Progress Note   Assessment/ Plan:   1. UTI - empiric management with Rocephin-cultures pending (mail-order being treated with recent [place]) 2. ESRD TTS: Emergent hemodialysis done yesterday for hyperkalemia and her usual outpatient dialysis schedule for today. I suspect that previous catheter problems may have been contributing to poor clearance/adequacy. 3. Hypertension/volume - improved blood pressures overnight after hypotensive during dialysis-monitor at dialysis today.  4. Anemia -low hemoglobin, will order for 1 unit packed red cell transfusion and continue ESA  5. Metabolic bone disease - Continue Hectorol, sensipar 30 and binders- 2 renvela ac 6. Nutrition - renal carb mod diet + multivitamian - npo today - daughter said she ate well last night 7. Right groin wound from venotomy site nonhealing (CBC) and infected/exposed RUA AVG stents:  appreciate input from vascular surgery in removing the infected graft/stent segment and  removing old dialysis catheter with open venotomy site and replacing it with left femoral tunneled dialysis catheter  8. AMS- Head CT negative - likely secondary to infection and sundowning with baseline dementia   Subjective:   Episode of unresponsiveness noted during dialysis yesterday without any significant cardiac dysrhythmia. Status post surgery to resect the infected stented AVG segment of HeRO right upper arm.    Objective:   BP 130/95 mmHg  Pulse 101  Temp(Src) 100.2 F (37.9 C) (Rectal)  Resp 16  Ht  (1.473 m)  Wt 59.1 kg (130 lb 4.7 oz)  BMI 27.24 kg/m2  SpO2 95%  Physical Exam: Gen: Pleasantly confused, being fed in bed CVS: Pulse regular tachycardia, S1 and S2 normal Resp: Clear to auscultation, no rales Abd: Soft, obese, nontender Ext: Blood stained, dry dressing right upper arm. Left femoral tunneled dialysis  catheter  Labs: BMET  Recent Labs Lab 04/08/15 1945 04/09/15 1410 04/10/15 0410 04/10/15 1450 04/10/15 1727 04/11/15 0103  NA 138 139 137 138 137 138  K 5.4* >7.5* 5.2* 6.8* 6.5* 4.7  CL 99* 100* 100* 105 101 101  CO2 17* 25 25  GLUCOSE 93 103* 101* 126* 138* 109*  BUN 41* 46* 34* 39* 39* 15  CREATININE 8.72* 9.96* 8.45* 9.08* 9.06* 5.06*  CALCIUM 9.6 9.2 8.9 8.6* 8.4* 8.4*  PHOS  --  6.2*  --   --   --  4.8*   CBC  Recent Labs Lab 04/09/15 1410 04/10/15 0410 04/10/15 1450 04/11/15 0103  WBC 5.1 5.4 6.7 5.8  HGB 11.0* 9.9* 9.6* 8.7*  HCT 34.2* 31.0* 28.6* 27.7*  MCV 89.1 88.8 86.1 91.4  PLT 121* 105* 95* 78*   Medications:    . amLODipine  2.5 mg Oral Once per day on Sun Mon Wed Fri  . amLODipine  5 mg Oral Once per day on Tue Thu Sat  . aspirin  81 mg Oral Daily  . cefTRIAXone (ROCEPHIN)  IV  2 g Intravenous Q24H  . cinacalcet  30 mg Oral Q breakfast  . [START ON 04/18/2015] darbepoetin (ARANESP) injection - DIALYSIS  150 mcg Intravenous Q Thu-HD  . dorzolamide-timolol  1 drop Both Eyes Daily  . doxercalciferol  3 mcg Intravenous Q T,Th,Sa-HD  . escitalopram  5 mg Oral Daily  . famotidine  20 mg Oral Daily  . ferric gluconate (FERRLECIT/NULECIT) IV  125 mg Intravenous Q T,Th,Sa-HD  . heparin  5,000 Units Subcutaneous 3 times per day  . multivitamin  1  tablet Oral QHS  . quiNINE  324 mg Oral Once per day on Tue Thu Sat  . sevelamer carbonate  1,600 mg Oral TID WC  . simvastatin  40 mg Oral q1800   Zetta BillsJay Julian Askin, MD 04/11/2015, 8:39 AM

## 2015-04-11 NOTE — Progress Notes (Signed)
Telephone blood consent received from patients daughter, Elijah BirkHolly Matthews as second RN to verify. Consent signed and placed in chart.

## 2015-04-11 NOTE — Progress Notes (Signed)
Attempted to call daughter x2 to obtain consent for blood.

## 2015-04-11 NOTE — Progress Notes (Signed)
Notified MD Hyman HopesWebb of pt's bp ranging from 90-130/30-40's, also informed MD despite multiple attempts for telephone blood consent, no consent has been received for blood products. MD aware and stated to hold blood products for dialysis once pt has been consented. Will continue to monitor.

## 2015-04-12 DIAGNOSIS — A4151 Sepsis due to Escherichia coli [E. coli]: Secondary | ICD-10-CM

## 2015-04-12 DIAGNOSIS — E1121 Type 2 diabetes mellitus with diabetic nephropathy: Secondary | ICD-10-CM

## 2015-04-12 LAB — RENAL FUNCTION PANEL
Albumin: 2.7 g/dL — ABNORMAL LOW (ref 3.5–5.0)
Anion gap: 12 (ref 5–15)
BUN: 26 mg/dL — ABNORMAL HIGH (ref 6–20)
CO2: 26 mmol/L (ref 22–32)
Calcium: 8.1 mg/dL — ABNORMAL LOW (ref 8.9–10.3)
Chloride: 98 mmol/L — ABNORMAL LOW (ref 101–111)
Creatinine, Ser: 6.57 mg/dL — ABNORMAL HIGH (ref 0.44–1.00)
GFR calc Af Amer: 6 mL/min — ABNORMAL LOW (ref >60)
GFR calc non Af Amer: 5 mL/min — ABNORMAL LOW (ref >60)
Glucose, Bld: 103 mg/dL — ABNORMAL HIGH (ref 65–99)
Phosphorus: 4.4 mg/dL (ref 2.5–4.6)
Potassium: 5 mmol/L (ref 3.5–5.1)
Sodium: 136 mmol/L (ref 135–145)

## 2015-04-12 LAB — CBC
HEMATOCRIT: 24.6 % — AB (ref 36.0–46.0)
HEMOGLOBIN: 7.7 g/dL — AB (ref 12.0–15.0)
MCH: 27 pg (ref 26.0–34.0)
MCHC: 31.3 g/dL (ref 30.0–36.0)
MCV: 86.3 fL (ref 78.0–100.0)
Platelets: 67 10*3/uL — ABNORMAL LOW (ref 150–400)
RBC: 2.85 MIL/uL — ABNORMAL LOW (ref 3.87–5.11)
RDW: 18.8 % — AB (ref 11.5–15.5)
WBC: 4.2 10*3/uL (ref 4.0–10.5)

## 2015-04-12 LAB — PREPARE RBC (CROSSMATCH)

## 2015-04-12 MED ORDER — SODIUM CHLORIDE 0.9 % IV SOLN: Freq: Once | INTRAVENOUS | Status: DC

## 2015-04-12 MED ORDER — HALOPERIDOL LACTATE 5 MG/ML IJ SOLN
2.0000 mg | Freq: Once | INTRAMUSCULAR | Status: AC
  Administered 2015-04-12: 2 mg via INTRAVENOUS

## 2015-04-12 MED ORDER — HYDRALAZINE HCL 20 MG/ML IJ SOLN
INTRAMUSCULAR | Status: AC
  Filled 2015-04-12: qty 1

## 2015-04-12 MED ORDER — HYDRALAZINE HCL 20 MG/ML IJ SOLN
10.0000 mg | Freq: Four times a day (QID) | INTRAMUSCULAR | Status: DC | PRN
  Administered 2015-04-12 – 2015-04-14 (×3): 10 mg via INTRAVENOUS
  Filled 2015-04-12 (×2): qty 1

## 2015-04-12 MED ORDER — HALOPERIDOL LACTATE 5 MG/ML IJ SOLN
INTRAMUSCULAR | Status: AC
  Administered 2015-04-12: 2 mg via INTRAVENOUS
  Filled 2015-04-12: qty 1

## 2015-04-12 MED ORDER — METOPROLOL TARTRATE 1 MG/ML IV SOLN
2.5000 mg | Freq: Once | INTRAVENOUS | Status: AC
  Administered 2015-04-12: 2.5 mg via INTRAVENOUS
  Filled 2015-04-12: qty 5

## 2015-04-12 NOTE — Progress Notes (Addendum)
[facility]  Subjective  - Confused and not following command.   Objective 188/47 95 97.3 F (36.3 C) (Axillary) 11 100%  Intake/Output Summary (Last 24 hours) at 04/12/15 0730 Last data filed at 04/12/15 0441  Gross per 24 hour  Intake    895 ml  Output   -471 ml  Net   1366 ml    Bright red bloody drainage on the dressing this am nursing reinforced  the dressing twice last night. Staples intact.  Iodoform guaze pulled back clean dry dressing placed over incision. Right forearm hematoma, compartments soft  Assessment/Planning: POD # 2  81 y.o. female is s/p:  1. Right femoral tunneled dialysis catheter removal 2. Left femoral vein tunneled dialysis catheter placement 3. Left femoral vein cannulation under ultrasound guidance 4. Removal of right arteriovenous graft segment  Bloody dressing over night, no active bleeding Continue IV antibiotics Will continue slow removal of packing as wound heals  Thomasena EdisCOLLINS, [facility] MAUREEN 04/12/2015 7:30 AM --  Laboratory Lab Results:  Recent Labs  04/11/15 0103 04/12/15 0508  WBC 5.8 4.2  HGB 8.7* 7.7*  HCT 27.7* 24.6*  PLT 78* 67*   BMET  Recent Labs  04/11/15 0103 04/11/15 2332  NA 138 136  K 4.7 5.0  CL 101 98*  CO2 25 26  GLUCOSE 109* 103*  BUN 15 26*  CREATININE 5.06* 6.57*  CALCIUM 8.4* 8.1*    COAG Lab Results  Component Value Date   INR 1.30 07/25/2013   INR 1.25 07/05/2010   No results found for: PTT   Addendum  I have independently interviewed and examined the patient, and I agree with the physician assistant's findings.    Leonides SakeBrian Mohsen Odenthal, MD [facility] Office: [phone] Pager: [phone]  04/12/2015, 3:06 PM

## 2015-04-12 NOTE — Progress Notes (Signed)
Verified with MD Allena KatzPatel, pt to receive 2 units of prbc with HD tomorrow 11/12.

## 2015-04-12 NOTE — Progress Notes (Addendum)
Patient ID: [name], female   DOB: Nov 13, 1928, 81 y.o.   MRN: [id number]  [facility] Progress Note   Assessment/ Plan:   1. Right groin wound from venotomy site nonhealing (CBC) and infected/exposed RUA AVG stents:  Status post removal of infected graft segment with exposed stent struts as well as removal of right femoral tunneled dialysis catheter and repair of venotomy site. On intravenous antibiotic therapy with vancomycin. Unfortunately continues to have bleeding from surgical site as well as development of her right forearm hematoma (unclear if associated with surgery or acquired traumatically during episode of confusion). 2. ESRD TTS: Regular hemodialysis treatment was done yesterday and I plan on repeating this again tomorrow-possibly with transfusion of 2 more units of packed red cells 3. Hypertension/volume - hypertensive overnight, suspect in part because of inability to give oral antihypertensive medications as well as agitation. 4. Anemia -lower hemoglobin status post packed red cell transfusion-suspect ongoing losses from surgical site as well as hematoma 5. Metabolic bone disease - Continue Hectorol, sensipar 30 and binders- 2 renvela ac 6. Nutrition - renal carb mod diet + multivitamin - may need a reevaluation of swallowing. 7.  AMS- Head CT negative - with dementia at baseline and appears to be suffering from multifactorial delirium versus sundowning during her hospitalization with  Subjective:   Hypotensive during most of dialysis yesterday preventing aggressive ultrafiltration-transfused 1 unit but hemoglobin lower this morning.    Objective:   BP 199/51 mmHg  Pulse 96  Temp(Src) 97.3 F (36.3 C) (Axillary)  Resp 15  Ht  (1.473 m)  Wt 61.2 kg (134 lb 14.7 oz)  BMI 28.21 kg/m2  SpO2 100%  Physical Exam: Gen: Pleasantly confused, being fed in bed CVS: Pulse regular tachycardia, S1 and S2 normal Resp: Clear to auscultation, no rales Abd:  Soft, obese, nontender Ext: Clean and reinforced dry dressing right upper arm. Hematoma noted of the right forearm-wearing protective mittens in both hands. Left femoral tunneled dialysis catheter  Labs: BMET  Recent Labs Lab 04/08/15 1945 04/09/15 1410 04/10/15 0410 04/10/15 1450 04/10/15 1727 04/11/15 0103 04/11/15 2332  NA 138 139 137 138 137 138 136  K 5.4* >7.5* 5.2* 6.8* 6.5* 4.7 5.0  CL 99* 100* 100* 105 101 101 98*  CO2 17* GLUCOSE 93 103* 101* 126* 138* 109* 103*  BUN 41* 46* 34* 39* 39* 15 26*  CREATININE 8.72* 9.96* 8.45* 9.08* 9.06* 5.06* 6.57*  CALCIUM 9.6 9.2 8.9 8.6* 8.4* 8.4* 8.1*  PHOS  --  6.2*  --   --   --  4.8* 4.4   CBC  Recent Labs Lab 04/10/15 0410 04/10/15 1450 04/11/15 0103 04/12/15 0508  WBC 5.4 6.7 5.8 4.2  HGB 9.9* 9.6* 8.7* 7.7*  HCT 31.0* 28.6* 27.7* 24.6*  MCV 88.8 86.1 91.4 86.3  PLT 105* 95* 78* 67*   Medications:    . sodium chloride   Intravenous Once  . amLODipine  2.5 mg Oral Once per day on Sun Mon Wed Fri  . amLODipine  5 mg Oral Once per day on Tue Thu Sat  . aspirin  81 mg Oral Daily  . atorvastatin  20 mg Oral q1800  . cefTRIAXone (ROCEPHIN)  IV  2 g Intravenous Q24H  . cinacalcet  30 mg Oral Q breakfast  . [START ON 04/18/2015] darbepoetin (ARANESP) injection - DIALYSIS  150 mcg Intravenous Q Thu-HD  . dorzolamide-timolol  1 drop Both Eyes Daily  .  doxercalciferol  3 mcg Intravenous Q T,Th,Sa-HD  . escitalopram  5 mg Oral Daily  . famotidine  20 mg Oral Daily  . ferric gluconate (FERRLECIT/NULECIT) IV  125 mg Intravenous Q T,Th,Sa-HD  . multivitamin  1 tablet Oral QHS  . sevelamer carbonate  1,600 mg Oral TID WC  . vancomycin  500 mg Intravenous Q T,Th,Sa-HD   Zetta BillsJay Ulysess Witz, MD 04/12/2015, 8:21 AM

## 2015-04-12 NOTE — Progress Notes (Signed)
PT Cancellation Note  Patient Details Name: [name] MRN: [id number] DOB: 09-19-1928   Cancelled Treatment:    Reason Eval/Treat Not Completed: Fatigue/lethargy limiting ability to participate.  RN gave BP meds, but now pt is difficult to arouse.  Will try another time.     Ashelynn Marks, Alison MurrayMegan F 04/12/2015, 11:00 AM

## 2015-04-12 NOTE — Progress Notes (Signed)
Pt is having increased confusion. Pulling at HD cath site and became combative. Pt Bp is up 198/65. Claiborne Billingsallahan NP Notified and gave orders. Will continue to monitor pt.

## 2015-04-12 NOTE — Progress Notes (Signed)
Pt is having increased agitation increase HR in the 120's and increase Bp 212/58. Rapid response call and responded to bed side. Claiborne Billingsallahan NP paged and gave orders. HD RN at bedside. Pt is hallucinating and combative. Claiborne Billingsallahan NP gave orders. Will conitnue to monitor pt.

## 2015-04-12 NOTE — Progress Notes (Signed)
PT Cancellation Note  Patient Details Name: [name] MRN: [id number] DOB: 09-22-1928   Cancelled Treatment:    Reason Eval/Treat Not Completed: Patient not medically ready.  Pt with elevated BP with systolic 199.  RN aware and will hold PT at this time and f/u as appropriate.     Mina Carlisi, Alison MurrayMegan F 04/12/2015, 10:09 AM

## 2015-04-12 NOTE — Care Management Important Message (Signed)
Important Message  Patient Details  Name: [name] MRN: [id number] Date of Birth: March 28, 1929   Medicare Important Message Given:  Yes    Terrall Bley P Orphia Mctigue 04/12/2015, 2:22 PM

## 2015-04-12 NOTE — Progress Notes (Signed)
PROGRESS NOTE  [name] EAV:[id number] DOB: 11/13/1928 DOA: 04/08/2015 PCP: Eustaquio Boyden, MD  HPI/Brief narrative 81 year old female patient with history of ESRD on TTS HD, HTN, diet-controlled DM 2, HLD, GERD, prior staph aureus bacteremia related to HD catheter, anxiety & depression, presented to [facility] ED on 04/09/15 with altered mental status (apparently pretty lucid and self-sufficient at baseline) off 1-2 days duration. Per family, she gets like this when she has a UTI and family also noted infected right upper extremity graft for which she has been following at [facility] not on antibiotics and the wound continues to have foul-smelling drainage. Admitted for acute encephalopathy related to UTI, infected right upper extremity graft. Nephrology consulted. Her last procedure on her right brachial HERO graft was 10/7 by Dr. Lorretta Harp when an ulcerated section was resected. She was seen in his office last week and started on Silvadene cream. The patient completed a two-week course of ceftazidime on 04/06/2015. More recently she had + cultures from arm AVGG 9/27 proteus mirabilis and 10/20 enterobacter cloace treated with Elita Quick x 2 weeks - the last dose was 11/5    Assessment/Plan: Sepsis -Secondary to right upper extremity graft infection -04/10/2015--patient became unresponsive on HD--CODE BLUE called, but patient did not lose pulse  -Blood pressure improved with 500 mL saline  -Blood pressure remained stable -04/11/2015 --Repeat blood cultures 2 sets -04/11/2015 Vancomycin added by nephrology -Blood pressure has improved  Recurrent UTI? - Continue ceftriaxone. Apparently does not make much urine. -unfortunately no urine culture was sent  RUE AV graft infection - Apparently has been going on for at least 2 months. Has been seen at [facility] but not on antibiotics. Wound draining malodorous pus intermittently. - Appreciate Dr. Allena Katz, Nephrology: Recommended vascular  surgery consultation. -appreciate Drs. Chen/Brabham - Continue IV Rocephin for now--surgical cultures preliminary--Escherichia coli -Follow wound culture results--the patient was ready on ceftriaxone which may compromise the patient's surgical cultures -On 03/08/2015, the patient underwent Revision right brachial hero graft with resection of the ulcerated portion; Revision of the PTFE portion and new graft was placed lateral to the ulcerated section -04/10/2015 right femoral tunneled dialysis catheter removed, placement of left femoral tunneled dialysis catheter, removal of right AV graft segment--frank pus was noted in the lumen of the proximal graft-There were also stent struts protruding through the mid segment of the right upper arm graft which were ultimately removed -The right upper extremity graft was removed in its entirety  Acute blood loss anemia -In part due to the patient's recent surgical procedure -Bloody dressing right upper extremity noted--now with right upper extremity hematoma -04/11/2015--transfused 1 unit PRBC -FOBT -Case discussed with Dr. Mliss Fritz to transfuse 2 additional units on dialysis 04/13/2015 -Remains hemodynamically stable -Monitor hemoglobin  Acute encephalopathy -Initially somnolent and obtunded--> now improved--pleasantly confused - Multifactorial including underlying infection, sundowning, and medications -Patient has underlying dementia - As per nursing, patient not cooperating with several aspects of care-refusing lab draws, dialysis initially at time of admission - CT head without acute findings.  Thrombocytopenia -This is chronic dating back to 07/30/2010 -Acute worsening secondary to infectious process -The patient's quinine is likely contributing to thrombocytopenia--d/c for now -Discontinue heparin -Start SCDs  ESRD on TTS HD/mild hyperkalemia - Management per nephrology-consulted.  Type II DM with renal complications - Diet  controlled. Last A1c: 5.3 - Monitor CBGs.  Hyperlipidemia - Continue statins  GERD - Continue PPI  Essential hypertension - question accuracy of BPs--wide variations -Patient  has not consistently hypertensive -Continue home dose amlodipine  Stable calcified right orbital mass, seen on CT head - Unclear etiology. Outpatient follow-up. -07/23/2010 MRI orbits revealed right orbital soft tissue mass   DVT prophylaxis: SCDs Code Status: full  Family Communication: none at bedside Disposition Plan: to med tele    Procedures/Studies: Dg Chest 2 View  04/08/2015  CLINICAL DATA:  Altered mental status. EXAM: CHEST  2 VIEW COMPARISON:  11/16/2014 FINDINGS: Unchanged large-bore right central venous catheter, tip at the atrial caval junction. Cardiomegaly is unchanged. No consolidation, pleural effusion or pneumothorax. No pulmonary edema. Vascular stent seen in the right arm. The bones are under mineralized. IMPRESSION: Stable cardiomegaly.  No acute pulmonary process. Electronically Signed   By: Rubye Oaks M.D.   On: 04/08/2015 23:46   Ct Head Wo Contrast  04/08/2015  CLINICAL DATA:  Confusion and disorientation today. EXAM: CT HEAD WITHOUT CONTRAST TECHNIQUE: Contiguous axial images were obtained from the base of the skull through the vertex without intravenous contrast. COMPARISON:  11/16/2014 FINDINGS: Stable age related cerebral atrophy, ventriculomegaly and periventricular white matter disease. No extra-axial fluid collections are identified. No CT findings for acute hemispheric infarction or intracranial hemorrhage. No mass lesions. The brainstem and cerebellum are normal. Stable calcified right orbital mass. No skull fracture or bone lesion. The paranasal sinuses are grossly clear. Mild Stable left-sided ethmoid disease. Remote blow in type fracture of the right orbit is noted. IMPRESSION: 1. Stable age related cerebral atrophy, ventriculomegaly and periventricular white matter  disease. No acute intracranial findings. 2. Stable calcified right orbital mass. 3. No acute bony findings. Electronically Signed   By: Rudie Meyer M.D.   On: 04/08/2015 23:41   Dg Chest Port 1 View  04/10/2015  CLINICAL DATA:  81 year old female status post dialysis catheter placement in the left groin. EXAM: PORTABLE CHEST 1 VIEW COMPARISON:  Chest x-ray 04/08/2015. FINDINGS: New central venous catheter noted from IVC approach with tips projecting over the right lobe of the liver, one medially, and the other laterally, presumably within right hepatic vein branches (but technically indeterminate on today's examination). Regardless, these are not located in the right heart at this time, and one of them is clearly outside of the IVC (the medial one may be within the IVC). Old right-sided subclavian catheter with tip terminating in the superior aspect of the right atrium, similar to the prior examination. Lung volumes are low. No pneumothorax. Mild crowding of the pulmonary vasculature, without frank pulmonary edema. No pleural effusion. Heart size is mildly enlarged. The patient is rotated to the left on today's exam, resulting in distortion of the mediastinal contours and reduced diagnostic sensitivity and specificity for mediastinal pathology. Atherosclerosis in the thoracic aorta. IMPRESSION: 1. Support apparatus, as above. Newly placed dialysis catheter tips appear likely located within hepatic vein branches in the liver. Clinical correlation is suggested. 2. Low lung volumes without radiographic evidence of acute cardiopulmonary disease. 3. Mild cardiomegaly. 4. Atherosclerosis. These results will be called to the ordering clinician or representative by the Radiologist Assistant, and communication documented in the PACS or zVision Dashboard. Electronically Signed   By: Trudie Reed M.D.   On: 04/10/2015 15:13   Dg Fluoro Guide Cv Line-no Report  04/10/2015  CLINICAL DATA:  FLOURO GUIDE CV LINE  Fluoroscopy was utilized by the requesting physician.  No radiographic interpretation.        Subjective: Patient is pleasant and confused. Denies any headache, chest pain, abdominal pain. Complains of some right upper  tremor and hand.  Objective: Filed Vitals:   04/12/15 0715 04/12/15 0730 04/12/15 0800 04/12/15 0815  BP: 188/47 169/33 199/51 160/65  Pulse: 95 98 96 92  Temp:  98.3 F (36.8 C)    TempSrc:  Oral    Resp: 11 16 15 16   Height:      Weight:      SpO2: 100% 100% 100% 100%    Intake/Output Summary (Last 24 hours) at 04/12/15 0924 Last data filed at 04/12/15 0730  Gross per 24 hour  Intake    835 ml  Output   -471 ml  Net   1306 ml   Weight change: 4.1 kg (9 lb 0.6 oz) Exam:   General:  Pt is alert, follows commands appropriately, not in acute distress  HEENT: No icterus, No thrush, No neck mass, [place]/AT  Cardiovascular: RRR, S1/S2, no rubs, no gallops  Respiratory: Poor inspiratory effort but clear to auscultation  Abdomen: Soft/+BS, non tender, non distended, no guarding; no hepatosplenomegaly  Extremities: No lymphangitis, No petechiae, No rashes, no synovitis; right upper extremity ecchymosis. Surgical site is intact without any active bleeding  Data Reviewed: Basic Metabolic Panel:  Recent Labs Lab 04/09/15 1410 04/10/15 0410 04/10/15 1450 04/10/15 1727 04/11/15 0103 04/11/15 2332  NA 139 137 138 137 138 136  K >7.5* 5.2* 6.8* 6.5* 4.7 5.0  CL 100* 100* 105 101 101 98*  CO2 27 25 17* 25 25 26   GLUCOSE 103* 101* 126* 138* 109* 103*  BUN 46* 34* 39* 39* 15 26*  CREATININE 9.96* 8.45* 9.08* 9.06* 5.06* 6.57*  CALCIUM 9.2 8.9 8.6* 8.4* 8.4* 8.1*  PHOS 6.2*  --   --   --  4.8* 4.4   Liver Function Tests:  Recent Labs Lab 04/08/15 1945 04/09/15 1410 04/11/15 0103 04/11/15 2332  AST 21  --   --   --   ALT 10*  --   --   --   ALKPHOS 83  --   --   --   BILITOT 1.0  --   --   --   PROT 7.9  --   --   --   ALBUMIN 3.1* 3.0* 2.7*  2.7*   No results for input(s): LIPASE, AMYLASE in the last 168 hours. No results for input(s): AMMONIA in the last 168 hours. CBC:  Recent Labs Lab 04/09/15 1410 04/10/15 0410 04/10/15 1450 04/11/15 0103 04/12/15 0508  WBC 5.1 5.4 6.7 5.8 4.2  HGB 11.0* 9.9* 9.6* 8.7* 7.7*  HCT 34.2* 31.0* 28.6* 27.7* 24.6*  MCV 89.1 88.8 86.1 91.4 86.3  PLT 121* 105* 95* 78* 67*   Cardiac Enzymes:  Recent Labs Lab 04/11/15 0103  TROPONINI 0.03   BNP: Invalid input(s): POCBNP CBG:  Recent Labs Lab 04/08/15 2128 04/08/15 2238 04/09/15 1807 04/10/15 1754  GLUCAP 84 86 114* 129*    Recent Results (from the past 240 hour(s))  Wound culture     Status: None   Collection Time: 04/09/15 10:54 AM  Result Value Ref Range Status   Specimen Description WOUND ARM RIGHT  Final   Special Requests Immunocompromised  Final   Gram Stain   Final    NO WBC SEEN NO SQUAMOUS EPITHELIAL CELLS SEEN NO ORGANISMS SEEN Performed at [facility]    Culture   Final    NO GROWTH 2 DAYS Performed at [facility]    Report Status 04/11/2015 FINAL  Final  Culture, blood (routine x 2)  Status: None (Preliminary result)   Collection Time: 04/09/15  2:10 PM  Result Value Ref Range Status   Specimen Description BLOOD HEMODIALYSIS CATHETER  Final   Special Requests BOTTLES DRAWN AEROBIC AND ANAEROBIC 10CC  Final   Culture NO GROWTH 2 DAYS  Final   Report Status PENDING  Incomplete  Culture, blood (routine x 2)     Status: None (Preliminary result)   Collection Time: 04/09/15  2:43 PM  Result Value Ref Range Status   Specimen Description BLOOD HEMODIALYSIS CATHETER  Final   Special Requests BOTTLES DRAWN AEROBIC AND ANAEROBIC 10CC  Final   Culture NO GROWTH 2 DAYS  Final   Report Status PENDING  Incomplete  Surgical pcr screen     Status: None   Collection Time: 04/09/15 11:38 PM  Result Value Ref Range Status   MRSA, PCR NEGATIVE NEGATIVE Final   Staphylococcus aureus NEGATIVE  NEGATIVE Final    Comment:        The Xpert SA Assay (FDA approved for NASAL specimens in patients over 22 years of age), is one component of a comprehensive surveillance program.  Test performance has been validated by [facility] for patients greater than or equal to 65 year old. It is not intended to diagnose infection nor to guide or monitor treatment.   Cath Tip Culture     Status: None (Preliminary result)   Collection Time: 04/10/15 11:26 AM  Result Value Ref Range Status   Specimen Description CATH TIP  Final   Special Requests DIALYSIS CATH TIP SPEC A PT ON ROCEPHIN  Final   Culture NO GROWTH Performed at [facility]   Final   Report Status PENDING  Incomplete  Anaerobic culture     Status: None (Preliminary result)   Collection Time: 04/10/15 12:35 PM  Result Value Ref Range Status   Specimen Description WOUND ARM RIGHT  Final   Special Requests   Final    OPEN WOUND RIGHT UPPER ARM DIALYSIS CATH SITE SPEC B PT ON ROCEPHIN   Gram Stain   Final    MODERATE WBC PRESENT,BOTH PMN AND MONONUCLEAR NO SQUAMOUS EPITHELIAL CELLS SEEN NO ORGANISMS SEEN Performed at [facility]    Culture PENDING  Incomplete   Report Status PENDING  Incomplete  Wound culture     Status: None (Preliminary result)   Collection Time: 04/10/15 12:35 PM  Result Value Ref Range Status   Specimen Description WOUND ARM RIGHT  Final   Special Requests   Final    OPEN WOUND RIGHT UPPER ARM DIALYSIS CATH SITE SPEC B PT ON ROCEPHIN   Gram Stain   Final    MODERATE WBC PRESENT,BOTH PMN AND MONONUCLEAR NO SQUAMOUS EPITHELIAL CELLS SEEN NO ORGANISMS SEEN Performed at [facility]    Culture   Final    FEW ESCHERICHIA COLI Performed at [facility]    Report Status PENDING  Incomplete  MRSA PCR Screening     Status: None   Collection Time: 04/11/15  1:39 AM  Result Value Ref Range Status   MRSA by PCR NEGATIVE NEGATIVE Final    Comment:        The GeneXpert  MRSA Assay (FDA approved for NASAL specimens only), is one component of a comprehensive MRSA colonization surveillance program. It is not intended to diagnose MRSA infection nor to guide or monitor treatment for MRSA infections.   Culture, blood (routine x 2)     Status: None (Preliminary result)  Collection Time: 04/11/15 12:06 PM  Result Value Ref Range Status   Specimen Description BLOOD LEFT HAND  Final   Special Requests IN PEDIATRIC BOTTLE 3CC  Final   Culture PENDING  Incomplete   Report Status PENDING  Incomplete  Culture, blood (routine x 2)     Status: None (Preliminary result)   Collection Time: 04/11/15 12:12 PM  Result Value Ref Range Status   Specimen Description BLOOD LEFT WRIST  Final   Special Requests IN PEDIATRIC BOTTLE 3CC  Final   Culture PENDING  Incomplete   Report Status PENDING  Incomplete     Scheduled Meds: . sodium chloride   Intravenous Once  . sodium chloride   Intravenous Once  . amLODipine  2.5 mg Oral Once per day on Sun Mon Wed Fri  . amLODipine  5 mg Oral Once per day on Tue Thu Sat  . aspirin  81 mg Oral Daily  . atorvastatin  20 mg Oral q1800  . cefTRIAXone (ROCEPHIN)  IV  2 g Intravenous Q24H  . cinacalcet  30 mg Oral Q breakfast  . [START ON 04/18/2015] darbepoetin (ARANESP) injection - DIALYSIS  150 mcg Intravenous Q Thu-HD  . dorzolamide-timolol  1 drop Both Eyes Daily  . doxercalciferol  3 mcg Intravenous Q T,Th,Sa-HD  . escitalopram  5 mg Oral Daily  . famotidine  20 mg Oral Daily  . ferric gluconate (FERRLECIT/NULECIT) IV  125 mg Intravenous Q T,Th,Sa-HD  . multivitamin  1 tablet Oral QHS  . sevelamer carbonate  1,600 mg Oral TID WC  . vancomycin  500 mg Intravenous Q T,Th,Sa-HD   Continuous Infusions: . sodium chloride 10 mL/hr at 04/10/15 2248     Lavonte Palos, DO  [facility] Pager [phone]  If 7PM-7AM, please contact night-coverage [email or web address] Password [email or web address] 04/12/2015, 9:24 AM   LOS: 3 days

## 2015-04-13 DIAGNOSIS — E785 Hyperlipidemia, unspecified: Secondary | ICD-10-CM

## 2015-04-13 LAB — WOUND CULTURE

## 2015-04-13 LAB — CATH TIP CULTURE: Culture: NO GROWTH

## 2015-04-13 LAB — RENAL FUNCTION PANEL
ALBUMIN: 2.5 g/dL — AB (ref 3.5–5.0)
ANION GAP: 10 (ref 5–15)
BUN: 10 mg/dL (ref 6–20)
CO2: 28 mmol/L (ref 22–32)
Calcium: 8.3 mg/dL — ABNORMAL LOW (ref 8.9–10.3)
Chloride: 99 mmol/L — ABNORMAL LOW (ref 101–111)
Creatinine, Ser: 4.44 mg/dL — ABNORMAL HIGH (ref 0.44–1.00)
GFR, EST AFRICAN AMERICAN: 10 mL/min — AB (ref >60)
GFR, EST NON AFRICAN AMERICAN: 8 mL/min — AB (ref >60)
Glucose, Bld: 96 mg/dL (ref 65–99)
PHOSPHORUS: 3.3 mg/dL (ref 2.5–4.6)
POTASSIUM: 3.7 mmol/L (ref 3.5–5.1)
Sodium: 137 mmol/L (ref 135–145)

## 2015-04-13 LAB — CBC
HEMATOCRIT: 25 % — AB (ref 36.0–46.0)
HEMATOCRIT: 24.1 % — AB (ref 36.0–46.0)
HEMOGLOBIN: 7.7 g/dL — AB (ref 12.0–15.0)
HEMOGLOBIN: 7.6 g/dL — AB (ref 12.0–15.0)
MCH: 27 pg (ref 26.0–34.0)
MCH: 27.4 pg (ref 26.0–34.0)
MCHC: 30.8 g/dL (ref 30.0–36.0)
MCHC: 31.5 g/dL (ref 30.0–36.0)
MCV: 87.7 fL (ref 78.0–100.0)
MCV: 87 fL (ref 78.0–100.0)
Platelets: 87 10*3/uL — ABNORMAL LOW (ref 150–400)
Platelets: 84 10*3/uL — ABNORMAL LOW (ref 150–400)
RBC: 2.85 MIL/uL — ABNORMAL LOW (ref 3.87–5.11)
RBC: 2.77 MIL/uL — AB (ref 3.87–5.11)
RDW: 19.3 % — ABNORMAL HIGH (ref 11.5–15.5)
RDW: 19.2 % — AB (ref 11.5–15.5)
WBC: 4.5 10*3/uL (ref 4.0–10.5)
WBC: 4.4 10*3/uL (ref 4.0–10.5)

## 2015-04-13 MED ORDER — HYDROCODONE-ACETAMINOPHEN 5-325 MG PO TABS
ORAL_TABLET | ORAL | Status: AC
  Filled 2015-04-13: qty 1

## 2015-04-13 MED ORDER — DOXERCALCIFEROL 4 MCG/2ML IV SOLN
INTRAVENOUS | Status: AC
  Filled 2015-04-13: qty 2

## 2015-04-13 MED ORDER — CIPROFLOXACIN IN D5W 400 MG/200ML IV SOLN
400.0000 mg | INTRAVENOUS | Status: DC
  Administered 2015-04-13 – 2015-04-14 (×2): 400 mg via INTRAVENOUS
  Filled 2015-04-13 (×2): qty 200

## 2015-04-13 NOTE — Evaluation (Signed)
Physical Therapy Evaluation Patient Details Name: [name] MRN: [id number] DOB: 1928/08/20 Today's Date: 04/13/2015   History of Present Illness  81 year old female patient with history of ESRD on TTS HD, HTN, diet-controlled DM 2, HLD, GERD, prior staph aureus bacteremia related to HD catheter, anxiety & depression, presented to [facility] ED on 04/09/15 with altered mental status. Admitted for sepsis with RUE AV graft infection.  Clinical Impression  Pt admitted with the above complications. Pt currently with functional limitations due to the deficits listed below (see PT Problem List). Educated on safe ambulation with a rolling walker. Required min guard- min assist with mobility. Motivated to improve her strength and independence. Good family support. Needs to be able to navigate 4 steps in order to return home safely. Will practice this during next therapy session, which I anticipate she will perform adequately. HR up to 121 during ambulatory bout, no dyspnea. Pt will benefit from skilled PT to increase their independence and safety with mobility to allow discharge to the venue listed below.       Follow Up Recommendations Home health PT;Supervision for mobility/OOB    Equipment Recommendations  Rolling walker with 5" wheels    Recommendations for Other Services OT consult     Precautions / Restrictions Precautions Precautions: Fall Restrictions Weight Bearing Restrictions: No      Mobility  Bed Mobility Overal bed mobility: Modified Independent             General bed mobility comments: extra time  Transfers Overall transfer level: Needs assistance Equipment used: Rolling walker (2 wheeled) Transfers: Sit to/from Stand Sit to Stand: Min assist         General transfer comment: Min assist for boost to stand x1 from bed, min guard for second trial. min assist for boost from low toilet using rail for support. Cues for hand placement and technique. Stated she felt  lightheaded first attempt and sat for a moment until resolved.  Ambulation/Gait Ambulation/Gait assistance: Min guard Ambulation Distance (Feet): 60 Feet Assistive device: Rolling walker (2 wheeled) Gait Pattern/deviations: Step-through pattern;Decreased stride length;Trunk flexed Gait velocity: slow Gait velocity interpretation: <1.8 ft/sec, indicative of risk for recurrent falls General Gait Details: Very slow. Cues for upright posture with forward gaze intermittently and to take large steps. No overt loss of balance or buckling noted. Some posterior lean initially but improved with distance. Cues for walker placement with turns  Stairs            Wheelchair Mobility    Modified Rankin (Stroke Patients Only)       Balance Overall balance assessment: Needs assistance Sitting-balance support: No upper extremity supported;Feet supported Sitting balance-Leahy Scale: Good     Standing balance support: No upper extremity supported Standing balance-Leahy Scale: Fair                               Pertinent Vitals/Pain Pain Assessment: Faces Faces Pain Scale: Hurts little more Pain Location: RUE Pain Descriptors / Indicators: Sore Pain Intervention(s): Monitored during session;Repositioned    Home Living Family/patient expects to be discharged to:: Private residence Living Arrangements: Spouse/significant other;Children Available Help at Discharge: Family;Available 24 hours/day Type of Home: House Home Access: Stairs to enter Entrance Stairs-Rails: Doctor, general practice of Steps: 4 Home Layout: One level Home Equipment: Bedside commode;Shower seat      Prior Function Level of Independence: Needs assistance   Gait / Transfers Assistance Needed: Used RW  at times but it is broken. Needs assist with steps.  ADL's / Homemaking Assistance Needed: daughter helps with bath. Can dress herself.        Hand Dominance        Extremity/Trunk  Assessment   Upper Extremity Assessment: Defer to OT evaluation           Lower Extremity Assessment: Generalized weakness         Communication   Communication: No difficulties  Cognition Arousal/Alertness: Awake/alert Behavior During Therapy: WFL for tasks assessed/performed Overall Cognitive Status: Impaired/Different from baseline Area of Impairment: Safety/judgement     Memory: Decreased short-term memory   Safety/Judgement: Decreased awareness of safety          General Comments General comments (skin integrity, edema, etc.): Husband present encourages patient is very supportive of her care.    Exercises General Exercises - Lower Extremity Ankle Circles/Pumps: AROM;Both;10 reps;Seated Quad Sets: Strengthening;Both;5 reps;Seated Straight Leg Raises: Strengthening;Both;10 reps;Supine      Assessment/Plan    PT Assessment Patient needs continued PT services  PT Diagnosis Difficulty walking;Abnormality of gait;Generalized weakness;Acute pain   PT Problem List Decreased strength;Decreased activity tolerance;Decreased balance;Decreased mobility;Decreased knowledge of use of DME;Decreased safety awareness;Pain  PT Treatment Interventions DME instruction;Gait training;Stair training;Functional mobility training;Therapeutic activities;Therapeutic exercise;Balance training;Neuromuscular re-education;Patient/family education   PT Goals (Current goals can be found in the Care Plan section) Acute Rehab PT Goals Patient Stated Goal: "Get my strength back" PT Goal Formulation: With patient Time For Goal Achievement: 04/27/15 Potential to Achieve Goals: Good    Frequency Min 3X/week   Barriers to discharge        Co-evaluation               End of Session   Activity Tolerance: Patient tolerated treatment well Patient left: in chair;with call bell/phone within reach;with chair alarm set;with family/visitor present;with SCD's reapplied Nurse Communication:  Mobility status         Time: 1529-1600 PT Time Calculation (min) (ACUTE ONLY): 31 min   Charges:   PT Evaluation $Initial PT Evaluation Tier I: 1 Procedure PT Treatments $Gait Training: 8-22 mins   PT G CodesBerton Mount:        Barbour, Logan S 04/13/2015, 4:12 PM Sunday SpillersLogan Secor Oz[place]Barbour, [place] CarolinaPT [phone]

## 2015-04-13 NOTE — Progress Notes (Addendum)
  [facility] Progress Note  Subjective  - POD #3  Patient seen in HD. Having some soreness with right arm around wound  Objective Filed Vitals:   04/13/15 0830  BP: 199/70  Pulse: 87  Temp:   Resp: 10    Intake/Output Summary (Last 24 hours) at 04/13/15 0847 Last data filed at 04/12/15 2137  Gross per 24 hour  Intake    290 ml  Output      0 ml  Net    290 ml    Right upper arm staples clean and intact. Open wound mid upper arm with serosanguinous drainage on dressing. Wound is clean with iodoform gauze packing.  Right forearm hematoma.  Assessment/Planning: 81 y.o. female is s/p: removal of right arteriovenous graft segment, right femoral TDC 3 Days Post-Op   Iodoform packing pulled back this am. Wound appears clean. Will likely remove packing tomorrow. Continue IV antibiotics.   Raymond GurneyKimberly A Trinh 04/13/2015 8:47 AM --  Laboratory CBC    Component Value Date/Time   WBC 4.4 04/13/2015 0750   HGB 7.6* 04/13/2015 0750   HCT 24.1* 04/13/2015 0750   PLT 84* 04/13/2015 0750    BMET    Component Value Date/Time   NA 137 04/13/2015 0750   K 3.7 04/13/2015 0750   CL 99* 04/13/2015 0750   CO2 28 04/13/2015 0750   GLUCOSE 96 04/13/2015 0750   GLUCOSE 111 02/07/2009   GLUCOSE 246* 05/28/2006 1229   BUN 10 04/13/2015 0750   CREATININE 4.44* 04/13/2015 0750   CALCIUM 8.3* 04/13/2015 0750   CALCIUM 9.6 07/18/2012 1051   GFRNONAA 8* 04/13/2015 0750   GFRAA 10* 04/13/2015 0750    COAG Lab Results  Component Value Date   INR 1.30 07/25/2013   INR 1.25 07/05/2010   No results found for: PTT  Antibiotics Anti-infectives    Start     Dose/Rate Route Frequency Ordered Stop   04/11/15 1800  vancomycin (VANCOCIN) 500 mg in sodium chloride 0.9 % 100 mL IVPB     500 mg 100 mL/hr over 60 Minutes Intravenous Every T-Th-Sa (Hemodialysis) 04/11/15 1054     04/11/15 1100  vancomycin (VANCOCIN) 1,250 mg in sodium chloride 0.9 % 250 mL IVPB     1,250  mg 166.7 mL/hr over 90 Minutes Intravenous  Once 04/11/15 1054 04/11/15 1335   04/09/15 1600  cefTRIAXone (ROCEPHIN) 2 g in dextrose 5 % 50 mL IVPB     2 g 100 mL/hr over 30 Minutes Intravenous Every 24 hours 04/09/15 1451     04/09/15 1000  quiNINE (QUALAQUIN) capsule 324 mg  Status:  Discontinued     324 mg Oral Once per day on Tue Thu Sat 04/09/15 0519 04/11/15 1256   04/09/15 0045  cefTRIAXone (ROCEPHIN) 1 g in dextrose 5 % 50 mL IVPB     1 g 100 mL/hr over 30 Minutes Intravenous  Once 04/09/15 0041 04/09/15 0330       Maris BergerKimberly Trinh, PA-C [facility] Office: [phone] Pager: [phone] 04/13/2015 8:47 AM  Addendum  I have independently interviewed and examined the patient, and I agree with the physician assistant's findings.  Will finish withdrawal of wick in former graft tunnel tomorrow and start wet-to-dry dressings tomorrow.  Leonides SakeBrian Kataleya Zaugg, MD [facility] [place] Office: [phone] Pager: [phone]  04/13/2015, 9:50 AM

## 2015-04-13 NOTE — Procedures (Signed)
Patient seen on Hemodialysis. QB 300, UF goal 3.5L Treatment adjusted as needed.  Zetta BillsJay Markell Sciascia MD [facility]. Office # [phone] Pager # [phone] 10:04 AM

## 2015-04-13 NOTE — Consult Note (Signed)
PHARMACY CONSULT NOTE   Pharmacy Consult for :   Ciprofloxacin Indication:  E.Coli AVG infection  Hospital Problems: Principal Problem:   Recurrent urinary tract infection Active Problems:   Type 2 diabetes with nephropathy (HCC)   HLD (hyperlipidemia)   GERD   ESRD on hemodialysis (HCC)   ANEMIA, IRON DEFICIENCY, CHRONIC   Thrombocytopenia (HCC)   UTI (lower urinary tract infection)   Infected prosthetic vascular graft (HCC)   Sepsis (HCC)   Allergies: No Known Allergies  Patient Measurements: Height:  (147.3 cm) Weight: 130 lb 8.2 oz (59.2 kg) IBW/kg (Calculated) : 40.9  Dosing Weight:  59 kg    Vital Signs: Temp: 98.3 F (36.8 C) (11/12 1320) Temp Source: Oral (11/12 1320) BP: 165/74 mmHg (11/12 1320) Pulse Rate: 88 (11/12 1320)  Labs:  Recent Labs  04/11/15 0103 04/11/15 2332 04/12/15 0508 04/13/15 0436 04/13/15 0750  WBC 5.8  --  4.2 4.5 4.4  HGB 8.7*  --  7.7* 7.7* 7.6*  PLT 78*  --  67* 87* 84*  CREATININE 5.06* 6.57*  --   --  4.44*   Estimated Creatinine Clearance: 7.2 mL/min (by C-G formula based on Cr of 4.44).  Microbiology: Recent Results (from the past 720 hour(s))  Wound culture     Status: None   Collection Time: 04/09/15 10:54 AM  Result Value Ref Range Status   Specimen Description WOUND ARM RIGHT  Final   Special Requests Immunocompromised  Final   Gram Stain   Final    NO WBC SEEN NO SQUAMOUS EPITHELIAL CELLS SEEN NO ORGANISMS SEEN Performed at [facility]    Culture   Final    NO GROWTH 2 DAYS Performed at [facility]    Report Status 04/11/2015 FINAL  Final  Culture, blood (routine x 2)     Status: None (Preliminary result)   Collection Time: 04/09/15  2:10 PM  Result Value Ref Range Status   Specimen Description BLOOD HEMODIALYSIS CATHETER  Final   Special Requests BOTTLES DRAWN AEROBIC AND ANAEROBIC 10CC  Final   Culture NO GROWTH 4 DAYS  Final   Report Status PENDING  Incomplete  Culture,  blood (routine x 2)     Status: None (Preliminary result)   Collection Time: 04/09/15  2:43 PM  Result Value Ref Range Status   Specimen Description BLOOD HEMODIALYSIS CATHETER  Final   Special Requests BOTTLES DRAWN AEROBIC AND ANAEROBIC 10CC  Final   Culture NO GROWTH 4 DAYS  Final   Report Status PENDING  Incomplete  Surgical pcr screen     Status: None   Collection Time: 04/09/15 11:38 PM  Result Value Ref Range Status   MRSA, PCR NEGATIVE NEGATIVE Final   Staphylococcus aureus NEGATIVE NEGATIVE Final    Comment:        The Xpert SA Assay (FDA approved for NASAL specimens in patients over 29 years of age), is one component of a comprehensive surveillance program.  Test performance has been validated by [facility] for patients greater than or equal to 29 year old. It is not intended to diagnose infection nor to guide or monitor treatment.   Cath Tip Culture     Status: None   Collection Time: 04/10/15 11:26 AM  Result Value Ref Range Status   Specimen Description CATH TIP  Final   Special Requests DIALYSIS CATH TIP SPEC A PT ON ROCEPHIN  Final   Culture NO GROWTH Performed at [facility]  Final   Report Status 04/13/2015 FINAL  Final  Anaerobic culture     Status: None (Preliminary result)   Collection Time: 04/10/15 12:35 PM  Result Value Ref Range Status   Specimen Description WOUND ARM RIGHT  Final   Special Requests   Final    OPEN WOUND RIGHT UPPER ARM DIALYSIS CATH SITE SPEC B PT ON ROCEPHIN   Gram Stain   Final    MODERATE WBC PRESENT,BOTH PMN AND MONONUCLEAR NO SQUAMOUS EPITHELIAL CELLS SEEN NO ORGANISMS SEEN Performed at [facility]    Culture   Final    NO ANAEROBES ISOLATED; CULTURE IN PROGRESS FOR 5 DAYS Performed at [facility]    Report Status PENDING  Incomplete  Wound culture     Status: None   Collection Time: 04/10/15 12:35 PM  Result Value Ref Range Status   Specimen Description WOUND ARM RIGHT  Final   Special  Requests   Final    OPEN WOUND RIGHT UPPER ARM DIALYSIS CATH SITE SPEC B PT ON ROCEPHIN   Gram Stain   Final    MODERATE WBC PRESENT,BOTH PMN AND MONONUCLEAR NO SQUAMOUS EPITHELIAL CELLS SEEN NO ORGANISMS SEEN Performed at [facility]    Culture   Final    FEW ESCHERICHIA COLI Performed at [facility]    Report Status 04/13/2015 FINAL  Final   Organism ID, Bacteria ESCHERICHIA COLI  Final      Susceptibility   Escherichia coli - MIC*    AMPICILLIN >=32 RESISTANT Resistant     AMPICILLIN/SULBACTAM >=32 RESISTANT Resistant     CEFAZOLIN >=64 RESISTANT Resistant     CEFEPIME <=1 SENSITIVE Sensitive     CEFTAZIDIME 8 INTERMEDIATE Intermediate     CEFTRIAXONE 32 RESISTANT Resistant     CIPROFLOXACIN <=0.25 SENSITIVE Sensitive     GENTAMICIN <=1 SENSITIVE Sensitive     IMIPENEM <=0.25 SENSITIVE Sensitive     PIP/TAZO 64 INTERMEDIATE Intermediate     TOBRAMYCIN <=1 SENSITIVE Sensitive     TRIMETH/SULFA Value in next row Sensitive      <=20 SENSITIVE(NOTE)    * FEW ESCHERICHIA COLI  MRSA PCR Screening     Status: None   Collection Time: 04/11/15  1:39 AM  Result Value Ref Range Status   MRSA by PCR NEGATIVE NEGATIVE Final    Comment:        The GeneXpert MRSA Assay (FDA approved for NASAL specimens only), is one component of a comprehensive MRSA colonization surveillance program. It is not intended to diagnose MRSA infection nor to guide or monitor treatment for MRSA infections.   Culture, blood (routine x 2)     Status: None (Preliminary result)   Collection Time: 04/11/15 12:06 PM  Result Value Ref Range Status   Specimen Description BLOOD LEFT HAND  Final   Special Requests IN PEDIATRIC BOTTLE 3CC  Final   Culture NO GROWTH 2 DAYS  Final   Report Status PENDING  Incomplete  Culture, blood (routine x 2)     Status: None (Preliminary result)   Collection Time: 04/11/15 12:12 PM  Result Value Ref Range Status   Specimen Description BLOOD LEFT WRIST   Final   Special Requests IN PEDIATRIC BOTTLE 3CC  Final   Culture NO GROWTH 2 DAYS  Final   Report Status PENDING  Incomplete    Medical/Surgical History: Past Medical History  Diagnosis Date  . Cough   . Diabetes mellitus     type 2  .  GERD (gastroesophageal reflux disease)   . Hypertension   . Bronchitis   . Osteoarthritis   . Dyslipidemia     remote hx/notes 10/06/2009  . ESRD (end stage renal disease) on dialysis [facility](HCC)     Dr Coladonato/[place]  . Depression     lexapro  . Staphylococcus aureus bacteremia with sepsis (HCC)     thought from HD catheter  . Anxiety    Past Surgical History  Procedure Laterality Date  . Eye surgery      Cataract extraction  . Thrombectomy and revision of arterioventous (av) goretex  graft Left 12/2004; 01/2006; 03/12/2009    forearmnotes 10/13/2010; forearmnotes 10/13/2010; upper arm/notes 03/19/2009  . Abdominal hysterectomy      remote hx/notes 10/06/2009  . Vitrectomy Left 03/2001    with membrane peel/notes 10/13/2010  . Arteriovenous graft placement Left 07/2004    forearm/notes 10/13/2010  . Arteriovenous graft placement Left 03/2006    upper armnotes 10/13/2010  . Peripheral vascular catheterization Right 02/18/2015    Procedure: A/V Shuntogram/Fistulagram;  Surgeon: Annice NeedyJason S [name], MD;  Location: [facility] INVASIVE CV LAB;  Service: Cardiovascular;  Laterality: Right;  . Peripheral vascular catheterization N/A 02/18/2015    Procedure: A/V Shunt Intervention;  Surgeon: Annice NeedyJason S [name], MD;  Location: [facility] INVASIVE CV LAB;  Service: Cardiovascular;  Laterality: N/A;  . Peripheral vascular catheterization N/A 03/06/2015    Procedure: Dialysis/Perma Catheter Insertion;  Surgeon: Annice NeedyJason S [name], MD;  Location: [facility] INVASIVE CV LAB;  Service: Cardiovascular;  Laterality: N/A;  . Revision of arteriovenous goretex graft Right 03/08/2015    Procedure: REVISION OF ARTERIOVENOUS GORETEX GRAFT;  Surgeon: Renford DillsGregory G Schnier, MD;  Location: [facility] ORS;  Service: Vascular;   Laterality: Right;  . Avgg removal Right 04/10/2015    Procedure: REMOVAL OF RIGHT ARM  ARTERIOVENOUS GORETEX GRAFT (AVGG);  Surgeon: Fransisco HertzBrian L Chen, MD;  [facility];  Service: Vascular;  Laterality: Right;  . Insertion of dialysis catheter Left 04/10/2015    Procedure: INSERTION OF DIALYSIS CATHETER LEFT GROIN;  Surgeon: Fransisco HertzBrian L Chen, MD;  [facility];  Service: Vascular;  Laterality: Left;  . Removal of a dialysis catheter Right 04/10/2015    Procedure: REMOVAL OF RIGHT GROIN DIALYSIS CATHETER;  Surgeon: Fransisco HertzBrian L Chen, MD;  Location: [facility] OR;  Service: Vascular;  Laterality: Right;    Current Medication[s] Include: Scheduled:  Scheduled:  . sodium chloride   Intravenous Once  . sodium chloride   Intravenous Once  . amLODipine  2.5 mg Oral Once per day on Sun Mon Wed Fri  . amLODipine  5 mg Oral Once per day on Tue Thu Sat  . aspirin  81 mg Oral Daily  . atorvastatin  20 mg Oral q1800  . cinacalcet  30 mg Oral Q breakfast  . ciprofloxacin  400 mg Intravenous Q24H  . [START ON 04/18/2015] darbepoetin (ARANESP) injection - DIALYSIS  150 mcg Intravenous Q Thu-HD  . dorzolamide-timolol  1 drop Both Eyes Daily  . doxercalciferol      . doxercalciferol  3 mcg Intravenous Q T,Th,Sa-HD  . escitalopram  5 mg Oral Daily  . famotidine  20 mg Oral Daily  . ferric gluconate (FERRLECIT/NULECIT) IV  125 mg Intravenous Q T,Th,Sa-HD  . multivitamin  1 tablet Oral QHS  . sevelamer carbonate  1,600 mg Oral TID WC   Antibiotic[s]: Anti-infectives    Start     Dose/Rate Route Frequency Ordered Stop   04/13/15 1700  ciprofloxacin (CIPRO) IVPB 400 mg  400 mg 200 mL/hr over 60 Minutes Intravenous Every 24 hours 04/13/15 1537     04/11/15 1800  vancomycin (VANCOCIN) 500 mg in sodium chloride 0.9 % 100 mL IVPB  Status:  Discontinued     500 mg 100 mL/hr over 60 Minutes Intravenous Every T-Th-Sa (Hemodialysis) 04/11/15 1054 04/13/15 1447   04/11/15 1100  vancomycin (VANCOCIN) 1,250 mg in sodium  chloride 0.9 % 250 mL IVPB     1,250 mg 166.7 mL/hr over 90 Minutes Intravenous  Once 04/11/15 1054 04/11/15 1335   04/09/15 1600  cefTRIAXone (ROCEPHIN) 2 g in dextrose 5 % 50 mL IVPB  Status:  Discontinued     2 g 100 mL/hr over 30 Minutes Intravenous Every 24 hours 04/09/15 1451 04/13/15 1446   04/09/15 1000  quiNINE (QUALAQUIN) capsule 324 mg  Status:  Discontinued     324 mg Oral Once per day on Tue Thu Sat 04/09/15 0519 04/11/15 1256   04/09/15 0045  cefTRIAXone (ROCEPHIN) 1 g in dextrose 5 % 50 mL IVPB     1 g 100 mL/hr over 30 Minutes Intravenous  Once 04/09/15 0041 04/09/15 0330     Assessment:  81 y/o female with AVG infection who has been on Ceftriaxone and Vancomycin for 3 days.  Surgical Cultures of removed AVG show E.Coli sensitive to Cipro.  Antibiotic therapy to be narrowed to Cipro IV per Pharmacy consult.  Goal of Therapy:  Cipro dosed for E. Coli infection and adjusted for HD schedule.  Plan:  1. Begin Cipro 400 mg IV q 24 hours.  Will begin after HD session today. 2. Follow up cultures, clinical course, HD schedule, length of therapy, and antibiotic dosing schedules adjusted as indicated.  Lynk Marti, Colin Benton,  Pharm.D,    11/12/20163:38 PM

## 2015-04-13 NOTE — Progress Notes (Addendum)
PROGRESS NOTE  [name] AVW:[id number] DOB: 1929/02/16 DOA: 04/08/2015 PCP: Eustaquio Boyden, MD  HPI/Brief narrative 81 year old female patient with history of ESRD on TTS HD, HTN, diet-controlled DM 2, HLD, GERD, prior staph aureus bacteremia related to HD catheter, anxiety & depression, presented to [facility] ED on 04/09/15 with altered mental status (apparently pretty lucid and self-sufficient at baseline) off 1-2 days duration. Per family, she gets like this when she has a UTI and family also noted infected right upper extremity graft for which she has been following at [facility] not on antibiotics and the wound continues to have foul-smelling drainage. Admitted for acute encephalopathy related to UTI, infected right upper extremity graft. Nephrology consulted. Her last procedure on her right brachial HERO graft was 10/7 by Dr. Lorretta Harp when an ulcerated section was resected. She was seen in his office last week and started on Silvadene cream. The patient completed a two-week course of ceftazidime on 04/06/2015. More recently she had + cultures from arm AVGG 9/27 proteus mirabilis and 10/20 enterobacter cloace treated with Elita Quick x 2 weeks - the last dose was 11/5    Assessment/Plan: Sepsis -Secondary to right upper extremity graft infection -04/10/2015--patient became unresponsive on HD--CODE BLUE called, but patient did not lose pulse  -Blood pressure improved with 500 mL saline  -Blood pressure remained stable -04/11/2015 --Repeat blood cultures 2 sets -04/11/2015 Vancomycin added by nephrology -Blood pressure has improved  Recurrent UTI? - Continue ceftriaxone. Apparently does not make much urine. -unfortunately no urine culture was sent  RUE AV graft infection - Apparently has been going on for at least 2 months. Has been seen at [facility] but not on antibiotics. Wound draining malodorous pus intermittently. - Appreciate Dr. Allena Katz, Nephrology: Recommended vascular  surgery consultation. -appreciate Drs. Chen/Brabham - Continue IV Rocephin for now--surgical cultures preliminary--Escherichia coli -Follow wound culture results--the patient was ready on ceftriaxone which may compromise the patient's surgical cultures -On 03/08/2015, the patient underwent Revision right brachial hero graft with resection of the ulcerated portion; Revision of the PTFE portion and new graft was placed lateral to the ulcerated section -04/10/2015 right femoral tunneled dialysis catheter removed, placement of left femoral tunneled dialysis catheter, removal of right AV graft segment--frank pus was noted in the lumen of the proximal graft-There were also stent struts protruding through the mid segment of the right upper arm graft which were ultimately removed -The right upper extremity graft was removed in its entirety -04/10/15--surgical culture = E.Coli-->start ciprofloxacin, d/c vanco and d/c ceftriaxone  Acute blood loss anemia -In part due to the patient's recent surgical procedure -Bloody dressing right upper extremity noted--now with right upper extremity hematoma -04/11/2015--transfused 1 unit PRBC -FOBT -Case discussed with Dr. Mliss Fritz to transfuse 2 additional units on dialysis 04/13/2015 -Remains hemodynamically stable -Monitor hemoglobin  Acute encephalopathy -Initially somnolent and obtunded--> now improved--pleasantly confused - Multifactorial including underlying infection, sundowning, and medications -Patient has underlying dementia - As per nursing, patient not cooperating with several aspects of care-refusing lab draws, dialysis initially at time of admission - CT head without acute findings.  Thrombocytopenia -This is chronic dating back to 07/30/2010 -Acute worsening secondary to infectious process -The patient's quinine is likely contributing to thrombocytopenia--d/c for now -Discontinue heparin -Start SCDs  ESRD on TTS HD/mild hyperkalemia -  Management per nephrology-consulted.  Type II DM with renal complications - Diet controlled. Last A1c: 5.3 - DC CBGs and insulin  Hyperlipidemia - Continue statins  GERD -  Continue PPI  Essential hypertension - question accuracy of BPs--wide variations -Patient has not consistently hypertensive -Continue home dose amlodipine  Stable calcified right orbital mass, seen on CT head - Unclear etiology. Outpatient follow-up. -07/23/2010 MRI orbits revealed right orbital soft tissue mass   DVT prophylaxis: SCDs Code Status: full  Family Communication: husband updated at bedside Disposition Plan: home 2-3 days, when cleared by renal and VVS      Procedures/Studies: Dg Chest 2 View  04/08/2015  CLINICAL DATA:  Altered mental status. EXAM: CHEST  2 VIEW COMPARISON:  11/16/2014 FINDINGS: Unchanged large-bore right central venous catheter, tip at the atrial caval junction. Cardiomegaly is unchanged. No consolidation, pleural effusion or pneumothorax. No pulmonary edema. Vascular stent seen in the right arm. The bones are under mineralized. IMPRESSION: Stable cardiomegaly.  No acute pulmonary process. Electronically Signed   By: Rubye Oaks M.D.   On: 04/08/2015 23:46   Ct Head Wo Contrast  04/08/2015  CLINICAL DATA:  Confusion and disorientation today. EXAM: CT HEAD WITHOUT CONTRAST TECHNIQUE: Contiguous axial images were obtained from the base of the skull through the vertex without intravenous contrast. COMPARISON:  11/16/2014 FINDINGS: Stable age related cerebral atrophy, ventriculomegaly and periventricular white matter disease. No extra-axial fluid collections are identified. No CT findings for acute hemispheric infarction or intracranial hemorrhage. No mass lesions. The brainstem and cerebellum are normal. Stable calcified right orbital mass. No skull fracture or bone lesion. The paranasal sinuses are grossly clear. Mild Stable left-sided ethmoid disease. Remote blow in type  fracture of the right orbit is noted. IMPRESSION: 1. Stable age related cerebral atrophy, ventriculomegaly and periventricular white matter disease. No acute intracranial findings. 2. Stable calcified right orbital mass. 3. No acute bony findings. Electronically Signed   By: Rudie Meyer M.D.   On: 04/08/2015 23:41   Dg Chest Port 1 View  04/10/2015  CLINICAL DATA:  81 year old female status post dialysis catheter placement in the left groin. EXAM: PORTABLE CHEST 1 VIEW COMPARISON:  Chest x-ray 04/08/2015. FINDINGS: New central venous catheter noted from IVC approach with tips projecting over the right lobe of the liver, one medially, and the other laterally, presumably within right hepatic vein branches (but technically indeterminate on today's examination). Regardless, these are not located in the right heart at this time, and one of them is clearly outside of the IVC (the medial one may be within the IVC). Old right-sided subclavian catheter with tip terminating in the superior aspect of the right atrium, similar to the prior examination. Lung volumes are low. No pneumothorax. Mild crowding of the pulmonary vasculature, without frank pulmonary edema. No pleural effusion. Heart size is mildly enlarged. The patient is rotated to the left on today's exam, resulting in distortion of the mediastinal contours and reduced diagnostic sensitivity and specificity for mediastinal pathology. Atherosclerosis in the thoracic aorta. IMPRESSION: 1. Support apparatus, as above. Newly placed dialysis catheter tips appear likely located within hepatic vein branches in the liver. Clinical correlation is suggested. 2. Low lung volumes without radiographic evidence of acute cardiopulmonary disease. 3. Mild cardiomegaly. 4. Atherosclerosis. These results will be called to the ordering clinician or representative by the Radiologist Assistant, and communication documented in the PACS or zVision Dashboard. Electronically Signed   By:  Trudie Reed M.D.   On: 04/10/2015 15:13   Dg Fluoro Guide Cv Line-no Report  04/10/2015  CLINICAL DATA:  FLOURO GUIDE CV LINE Fluoroscopy was utilized by the requesting physician.  No radiographic interpretation.  Subjective: Patient is presently confused but denies any headache, fevers, chills, chest pain, shortness breath, abdominal pain. No vomiting or diarrhea. No abdominal pain. Complains of some pain in the right arm.  Objective: Filed Vitals:   04/13/15 1100 04/13/15 1130 04/13/15 1200 04/13/15 1320  BP: 142/57 150/60 139/59 165/74  Pulse: 83 84 81 88  Temp:  98.2 F (36.8 C)  98.3 F (36.8 C)  TempSrc:  Oral  Oral  Resp: 10 11 12 12   Height:      Weight:      SpO2: 100% 100% 100% 100%    Intake/Output Summary (Last 24 hours) at 04/13/15 1448 Last data filed at 04/13/15 1321  Gross per 24 hour  Intake    625 ml  Output   1911 ml  Net  -1286 ml   Weight change: 2.21 kg (4 lb 14 oz) Exam:   General:  Pt is alert, follows commands appropriately, not in acute distress  HEENT: No icterus, No thrush, No neck mass, [place]/AT  Cardiovascular: RRR, S1/S2, no rubs, no gallops  Respiratory: CTA bilaterally, no wheezing, no crackles, no rhonchi  Abdomen: Soft/+BS, non tender, non distended, no guarding  Extremities: No edema, No lymphangitis, No petechiae, No rashes, no synovitis  Data Reviewed: Basic Metabolic Panel:  Recent Labs Lab 04/09/15 1410  04/10/15 1450 04/10/15 1727 04/11/15 0103 04/11/15 2332 04/13/15 0750  NA 139  < > 138 137 138 136 137  K >7.5*  < > 6.8* 6.5* 4.7 5.0 3.7  CL 100*  < > 105 101 101 98* 99*  CO2 27  < > 17* 25 25 26 28   GLUCOSE 103*  < > 126* 138* 109* 103* 96  BUN 46*  < > 39* 39* 15 26* 10  CREATININE 9.96*  < > 9.08* 9.06* 5.06* 6.57* 4.44*  CALCIUM 9.2  < > 8.6* 8.4* 8.4* 8.1* 8.3*  PHOS 6.2*  --   --   --  4.8* 4.4 3.3  < > = values in this interval not displayed. Liver Function Tests:  Recent Labs Lab  04/08/15 1945 04/09/15 1410 04/11/15 0103 04/11/15 2332 04/13/15 0750  AST 21  --   --   --   --   ALT 10*  --   --   --   --   ALKPHOS 83  --   --   --   --   BILITOT 1.0  --   --   --   --   PROT 7.9  --   --   --   --   ALBUMIN 3.1* 3.0* 2.7* 2.7* 2.5*   No results for input(s): LIPASE, AMYLASE in the last 168 hours. No results for input(s): AMMONIA in the last 168 hours. CBC:  Recent Labs Lab 04/10/15 1450 04/11/15 0103 04/12/15 0508 04/13/15 0436 04/13/15 0750  WBC 6.7 5.8 4.2 4.5 4.4  HGB 9.6* 8.7* 7.7* 7.7* 7.6*  HCT 28.6* 27.7* 24.6* 25.0* 24.1*  MCV 86.1 91.4 86.3 87.7 87.0  PLT 95* 78* 67* 87* 84*   Cardiac Enzymes:  Recent Labs Lab 04/11/15 0103  TROPONINI 0.03   BNP: Invalid input(s): POCBNP CBG:  Recent Labs Lab 04/08/15 2128 04/08/15 2238 04/09/15 1807 04/10/15 1754  GLUCAP 84 86 114* 129*    Recent Results (from the past 240 hour(s))  Wound culture     Status: None   Collection Time: 04/09/15 10:54 AM  Result Value Ref Range Status   Specimen Description WOUND ARM RIGHT  Final   Special Requests Immunocompromised  Final   Gram Stain   Final    NO WBC SEEN NO SQUAMOUS EPITHELIAL CELLS SEEN NO ORGANISMS SEEN Performed at [facility]    Culture   Final    NO GROWTH 2 DAYS Performed at [facility]    Report Status 04/11/2015 FINAL  Final  Culture, blood (routine x 2)     Status: None (Preliminary result)   Collection Time: 04/09/15  2:10 PM  Result Value Ref Range Status   Specimen Description BLOOD HEMODIALYSIS CATHETER  Final   Special Requests BOTTLES DRAWN AEROBIC AND ANAEROBIC 10CC  Final   Culture NO GROWTH 4 DAYS  Final   Report Status PENDING  Incomplete  Culture, blood (routine x 2)     Status: None (Preliminary result)   Collection Time: 04/09/15  2:43 PM  Result Value Ref Range Status   Specimen Description BLOOD HEMODIALYSIS CATHETER  Final   Special Requests BOTTLES DRAWN AEROBIC AND ANAEROBIC 10CC   Final   Culture NO GROWTH 4 DAYS  Final   Report Status PENDING  Incomplete  Surgical pcr screen     Status: None   Collection Time: 04/09/15 11:38 PM  Result Value Ref Range Status   MRSA, PCR NEGATIVE NEGATIVE Final   Staphylococcus aureus NEGATIVE NEGATIVE Final    Comment:        The Xpert SA Assay (FDA approved for NASAL specimens in patients over 11 years of age), is one component of a comprehensive surveillance program.  Test performance has been validated by [facility] for patients greater than or equal to 37 year old. It is not intended to diagnose infection nor to guide or monitor treatment.   Cath Tip Culture     Status: None   Collection Time: 04/10/15 11:26 AM  Result Value Ref Range Status   Specimen Description CATH TIP  Final   Special Requests DIALYSIS CATH TIP SPEC A PT ON ROCEPHIN  Final   Culture NO GROWTH Performed at [facility]   Final   Report Status 04/13/2015 FINAL  Final  Anaerobic culture     Status: None (Preliminary result)   Collection Time: 04/10/15 12:35 PM  Result Value Ref Range Status   Specimen Description WOUND ARM RIGHT  Final   Special Requests   Final    OPEN WOUND RIGHT UPPER ARM DIALYSIS CATH SITE SPEC B PT ON ROCEPHIN   Gram Stain   Final    MODERATE WBC PRESENT,BOTH PMN AND MONONUCLEAR NO SQUAMOUS EPITHELIAL CELLS SEEN NO ORGANISMS SEEN Performed at [facility]    Culture   Final    NO ANAEROBES ISOLATED; CULTURE IN PROGRESS FOR 5 DAYS Performed at [facility]    Report Status PENDING  Incomplete  Wound culture     Status: None   Collection Time: 04/10/15 12:35 PM  Result Value Ref Range Status   Specimen Description WOUND ARM RIGHT  Final   Special Requests   Final    OPEN WOUND RIGHT UPPER ARM DIALYSIS CATH SITE SPEC B PT ON ROCEPHIN   Gram Stain   Final    MODERATE WBC PRESENT,BOTH PMN AND MONONUCLEAR NO SQUAMOUS EPITHELIAL CELLS SEEN NO ORGANISMS SEEN Performed at [facility]    Culture   Final    FEW ESCHERICHIA COLI Performed at [facility]    Report Status 04/13/2015 FINAL  Final   Organism ID, Bacteria ESCHERICHIA COLI  Final      Susceptibility   Escherichia coli - MIC*    AMPICILLIN >=32 RESISTANT Resistant     AMPICILLIN/SULBACTAM >=32 RESISTANT Resistant     CEFAZOLIN >=64 RESISTANT Resistant     CEFEPIME <=1 SENSITIVE Sensitive     CEFTAZIDIME 8 INTERMEDIATE Intermediate     CEFTRIAXONE 32 RESISTANT Resistant     CIPROFLOXACIN <=0.25 SENSITIVE Sensitive     GENTAMICIN <=1 SENSITIVE Sensitive     IMIPENEM <=0.25 SENSITIVE Sensitive     PIP/TAZO 64 INTERMEDIATE Intermediate     TOBRAMYCIN <=1 SENSITIVE Sensitive     TRIMETH/SULFA Value in next row Sensitive      <=20 SENSITIVE(NOTE)    * FEW ESCHERICHIA COLI  MRSA PCR Screening     Status: None   Collection Time: 04/11/15  1:39 AM  Result Value Ref Range Status   MRSA by PCR NEGATIVE NEGATIVE Final    Comment:        The GeneXpert MRSA Assay (FDA approved for NASAL specimens only), is one component of a comprehensive MRSA colonization surveillance program. It is not intended to diagnose MRSA infection nor to guide or monitor treatment for MRSA infections.   Culture, blood (routine x 2)     Status: None (Preliminary result)   Collection Time: 04/11/15 12:06 PM  Result Value Ref Range Status   Specimen Description BLOOD LEFT HAND  Final   Special Requests IN PEDIATRIC BOTTLE 3CC  Final   Culture NO GROWTH 2 DAYS  Final   Report Status PENDING  Incomplete  Culture, blood (routine x 2)     Status: None (Preliminary result)   Collection Time: 04/11/15 12:12 PM  Result Value Ref Range Status   Specimen Description BLOOD LEFT WRIST  Final   Special Requests IN PEDIATRIC BOTTLE 3CC  Final   Culture NO GROWTH 2 DAYS  Final   Report Status PENDING  Incomplete     Scheduled Meds: . sodium chloride   Intravenous Once  . sodium chloride   Intravenous Once  . amLODipine   2.5 mg Oral Once per day on Sun Mon Wed Fri  . amLODipine  5 mg Oral Once per day on Tue Thu Sat  . aspirin  81 mg Oral Daily  . atorvastatin  20 mg Oral q1800  . cinacalcet  30 mg Oral Q breakfast  . [START ON 04/18/2015] darbepoetin (ARANESP) injection - DIALYSIS  150 mcg Intravenous Q Thu-HD  . dorzolamide-timolol  1 drop Both Eyes Daily  . doxercalciferol      . doxercalciferol  3 mcg Intravenous Q T,Th,Sa-HD  . escitalopram  5 mg Oral Daily  . famotidine  20 mg Oral Daily  . ferric gluconate (FERRLECIT/NULECIT) IV  125 mg Intravenous Q T,Th,Sa-HD  . multivitamin  1 tablet Oral QHS  . sevelamer carbonate  1,600 mg Oral TID WC   Continuous Infusions: . sodium chloride 10 mL/hr at 04/10/15 2248     Daley Mooradian, DO  [facility] Pager [phone]  If 7PM-7AM, please contact night-coverage [email or web address] Password [email or web address] 04/13/2015, 2:48 PM   LOS: 4 days

## 2015-04-13 NOTE — Progress Notes (Signed)
Patient ID: Cleaster CorinJosie W Carmer, female   DOB: 12-Dec-1928, 81 y.o.   MRN: [id number]  [facility] Progress Note   Assessment/ Plan:   1. Right groin wound from venotomy site nonhealing (CBC) and infected/exposed RUA AVG stents:  Status post removal of infected graft segment with exposed stent struts as well as removal of right femoral tunneled dialysis catheter and repair of venotomy site. On Vancomycin and plans for wound care per vascular surgery. 2. ESRD TTS: Hd today per usual OP schedule with 2 units PRBCs 3. Hypertension/volume - improvement of BP trend noted overnight- monitor with UF at HD. 4. Anemia -low but stable hemoglobin -from surgical site losses as well as hematoma, getting PRBCs at HD 5. Metabolic bone disease - Continue Hectorol, sensipar 30 and binders- 2 renvela ac 6. Nutrition - renal carb mod diet + multivitamin - may need a reevaluation of swallowing. 7.  AMS- Head CT negative - with dementia at baseline and appears to be suffering from multifactorial delirium versus sundowning during her hospitalization  Subjective:   No acute events noted overnight- unable to undertake PT/OT yesterday because of somnolence     Objective:   BP 137/57 mmHg  Pulse 86  Temp(Src) 98.5 F (36.9 C) (Oral)  Resp 11  Ht 4\' 10"  (1.473 m)  Wt 60.9 kg (134 lb 4.2 oz)  BMI 28.07 kg/m2  SpO2 99%  Physical Exam: Gen: resting comfortably at dialysis CVS: Pulse regular rate and rhythm, S1 and S2 normal Resp: Clear to auscultation, no rales Abd: Soft, obese, nontender Ext: Clean and dry dressing right upper arm with intact clean staples. Hematoma noted of the right forearm-not larger than yesterday. Left femoral tunneled dialysis catheter  Labs: BMET  Recent Labs Lab 04/09/15 1410 04/10/15 0410 04/10/15 1450 04/10/15 1727 04/11/15 0103 04/11/15 2332 04/13/15 0750  NA 139 137 138 137 138 136 137  K >7.5* 5.2* 6.8* 6.5* 4.7 5.0 3.7  CL 100* 100* 105 101 101 98* 99*   CO2 27 25 17* 25 25 26 28   GLUCOSE 103* 101* 126* 138* 109* 103* 96  BUN 46* 34* 39* 39* 15 26* 10  CREATININE 9.96* 8.45* 9.08* 9.06* 5.06* 6.57* 4.44*  CALCIUM 9.2 8.9 8.6* 8.4* 8.4* 8.1* 8.3*  PHOS 6.2*  --   --   --  4.8* 4.4 3.3   CBC  Recent Labs Lab 04/11/15 0103 04/12/15 0508 04/13/15 0436 04/13/15 0750  WBC 5.8 4.2 4.5 4.4  HGB 8.7* 7.7* 7.7* 7.6*  HCT 27.7* 24.6* 25.0* 24.1*  MCV 91.4 86.3 87.7 87.0  PLT 78* 67* 87* 84*   Medications:    . sodium chloride   Intravenous Once  . sodium chloride   Intravenous Once  . amLODipine  2.5 mg Oral Once per day on Sun Mon Wed Fri  . amLODipine  5 mg Oral Once per day on Tue Thu Sat  . aspirin  81 mg Oral Daily  . atorvastatin  20 mg Oral q1800  . cefTRIAXone (ROCEPHIN)  IV  2 g Intravenous Q24H  . cinacalcet  30 mg Oral Q breakfast  . [START ON 04/18/2015] darbepoetin (ARANESP) injection - DIALYSIS  150 mcg Intravenous Q Thu-HD  . dorzolamide-timolol  1 drop Both Eyes Daily  . doxercalciferol      . doxercalciferol  3 mcg Intravenous Q T,Th,Sa-HD  . escitalopram  5 mg Oral Daily  . famotidine  20 mg Oral Daily  . ferric gluconate (FERRLECIT/NULECIT) IV  125 mg Intravenous  Q T,Th,Sa-HD  . multivitamin  1 tablet Oral QHS  . sevelamer carbonate  1,600 mg Oral TID WC  . vancomycin  500 mg Intravenous Q T,Th,Sa-HD   Zetta Bills, MD 04/13/2015, 9:54 AM

## 2015-04-14 DIAGNOSIS — A4151 Sepsis due to Escherichia coli [E. coli]: Secondary | ICD-10-CM

## 2015-04-14 LAB — TYPE AND SCREEN
ABO/RH(D): A POS
ANTIBODY SCREEN: NEGATIVE
UNIT DIVISION: 0
Unit division: 0
Unit division: 0

## 2015-04-14 LAB — CULTURE, BLOOD (ROUTINE X 2)
Culture: NO GROWTH
Culture: NO GROWTH

## 2015-04-14 MED ORDER — SEVELAMER CARBONATE 800 MG PO TABS: 1600.0000 mg | ORAL_TABLET | Freq: Three times a day (TID) | ORAL | Status: DC

## 2015-04-14 MED ORDER — ATORVASTATIN CALCIUM 20 MG PO TABS: 20.0000 mg | ORAL_TABLET | Freq: Every day | ORAL | Status: DC

## 2015-04-14 MED ORDER — CIPROFLOXACIN HCL 500 MG PO TABS: 500.0000 mg | ORAL_TABLET | ORAL | Status: DC

## 2015-04-14 MED ORDER — CARVEDILOL 3.125 MG PO TABS
3.1250 mg | ORAL_TABLET | Freq: Two times a day (BID) | ORAL | Status: DC
  Administered 2015-04-14 – 2015-04-15 (×2): 3.125 mg via ORAL
  Filled 2015-04-14 (×2): qty 1

## 2015-04-14 NOTE — Progress Notes (Signed)
PROGRESS NOTE  TYLIA EWELL XBM:[id number] DOB: 01/29/1929 DOA: 04/08/2015 PCP: Eustaquio Boyden, MD  HPI/Brief narrative 81 year old female patient with history of ESRD on TTS HD, HTN, diet-controlled DM 2, HLD, GERD, prior staph aureus bacteremia related to HD catheter, anxiety & depression, presented to [facility] ED on 04/09/15 with altered mental status (apparently pretty lucid and self-sufficient at baseline) off 1-2 days duration. Per family, she gets like this when she has a UTI and family also noted infected right upper extremity graft for which she has been following at [facility] not on antibiotics and the wound continues to have foul-smelling drainage. Admitted for acute encephalopathy related to UTI, infected right upper extremity graft. Nephrology consulted. Her last procedure on her right brachial HERO graft was 10/7 by Dr. Lorretta Harp when an ulcerated section was resected. She was seen in his office last week and started on Silvadene cream. The patient completed a two-week course of ceftazidime on 04/06/2015. More recently she had + cultures from arm AVGG 9/27 proteus mirabilis and 10/20 enterobacter cloace treated with Elita Quick x 2 weeks - the last dose was 11/5    Assessment/Plan: Sepsis -Secondary to right upper extremity graft infection -04/10/2015--patient became unresponsive on HD--CODE BLUE called, but patient did not lose pulse  -Blood pressure improved with 500 mL saline  -Blood pressure--improved, now HTN -04/11/2015 --Repeat blood cultures 2 sets--neg to date -04/11/2015 Vancomycin added by nephrology-->d/c on 11/12  Recurrent UTI? - Continue ceftriaxone. Apparently does not make much urine. -unfortunately no urine culture was sent  RUE AV graft infection - Apparently has been going on for at least 2 months. Has been seen at [facility] but not on antibiotics. Wound draining malodorous pus intermittently. - Appreciate Dr. Allena Katz, Nephrology: Recommended vascular  surgery consultation. -appreciate Drs. Chen/Brabham - Continue IV Rocephin for now--surgical cultures preliminary--Escherichia coli -Follow wound culture results--the patient was ready on ceftriaxone which may compromise the patient's surgical cultures -On 03/08/2015, the patient underwent Revision right brachial hero graft with resection of the ulcerated portion; Revision of the PTFE portion and new graft was placed lateral to the ulcerated section -04/10/2015 right femoral tunneled dialysis catheter removed, placement of left femoral tunneled dialysis catheter, removal of right AV graft segment--frank pus was noted in the lumen of the proximal graft-There were also stent struts protruding through the mid segment of the right upper arm graft which were ultimately removed -The right upper extremity graft was removed in its entirety -04/10/15--surgical culture = E.Coli-->start ciprofloxacin, d/c vanco and d/c ceftriaxone -plan 14 days of ciprofloxacin--switch to po after d/c  Acute blood loss anemia -In part due to the patient's recent surgical procedure -Bloody dressing right upper extremity noted--now with right upper extremity hematoma -04/11/2015--transfused 1 unit PRBC -FOBT -Case discussed with Dr. Mliss Fritz to transfuse 2 additional units on dialysis 04/13/2015 -Remains hemodynamically stable -Monitor hemoglobin  Acute encephalopathy -Initially somnolent and obtunded--> now improved--pleasantly confused - Multifactorial including underlying infection, sundowning, and medications -Patient has underlying dementia - As per nursing, patient not cooperating with several aspects of care-refusing lab draws, dialysis initially at time of admission - CT head without acute findings.  Thrombocytopenia -This is chronic dating back to 07/30/2010 -Acute worsening secondary to infectious process -The patient's quinine is likely contributing to thrombocytopenia--d/c for now -Discontinue  heparin -Start SCDs  ESRD on TTS HD/mild hyperkalemia - Management per nephrology-appreciated  Type II DM with renal complications - Diet controlled. Last A1c: 5.3 - DC CBGs and  insulin  Hyperlipidemia - Continue statins  GERD - Continue PPI  Essential hypertension - question accuracy of BPs--wide variations -Patient has not consistently hypertensive -Continue home dose amlodipine -carvedilol added  Stable calcified right orbital mass, seen on CT head - Unclear etiology. Outpatient follow-up. -07/23/2010 MRI orbits revealed right orbital soft tissue mass   Code Status: full  Family Communication: daughter updated at bedside 04/14/15 Disposition Plan: home 04/15/15 when cleared by renal and [facility]VS         Procedures/Studies: Dg Chest 2 View  04/08/2015  CLINICAL DATA:  Altered mental status. EXAM: CHEST  2 VIEW COMPARISON:  11/16/2014 FINDINGS: Unchanged large-bore right central venous catheter, tip at the atrial caval junction. Cardiomegaly is unchanged. No consolidation, pleural effusion or pneumothorax. No pulmonary edema. Vascular stent seen in the right arm. The bones are under mineralized. IMPRESSION: Stable cardiomegaly.  No acute pulmonary process. Electronically Signed   By: Rubye Oaks M.D.   On: 04/08/2015 23:46   Ct Head Wo Contrast  04/08/2015  CLINICAL DATA:  Confusion and disorientation today. EXAM: CT HEAD WITHOUT CONTRAST TECHNIQUE: Contiguous axial images were obtained from the base of the skull through the vertex without intravenous contrast. COMPARISON:  11/16/2014 FINDINGS: Stable age related cerebral atrophy, ventriculomegaly and periventricular white matter disease. No extra-axial fluid collections are identified. No CT findings for acute hemispheric infarction or intracranial hemorrhage. No mass lesions. The brainstem and cerebellum are normal. Stable calcified right orbital mass. No skull fracture or bone lesion. The paranasal sinuses are grossly  clear. Mild Stable left-sided ethmoid disease. Remote blow in type fracture of the right orbit is noted. IMPRESSION: 1. Stable age related cerebral atrophy, ventriculomegaly and periventricular white matter disease. No acute intracranial findings. 2. Stable calcified right orbital mass. 3. No acute bony findings. Electronically Signed   By: Rudie Meyer M.D.   On: 04/08/2015 23:41   Dg Chest Port 1 View  04/10/2015  CLINICAL DATA:  81 year old female status post dialysis catheter placement in the left groin. EXAM: PORTABLE CHEST 1 VIEW COMPARISON:  Chest x-ray 04/08/2015. FINDINGS: New central venous catheter noted from IVC approach with tips projecting over the right lobe of the liver, one medially, and the other laterally, presumably within right hepatic vein branches (but technically indeterminate on today's examination). Regardless, these are not located in the right heart at this time, and one of them is clearly outside of the IVC (the medial one may be within the IVC). Old right-sided subclavian catheter with tip terminating in the superior aspect of the right atrium, similar to the prior examination. Lung volumes are low. No pneumothorax. Mild crowding of the pulmonary vasculature, without frank pulmonary edema. No pleural effusion. Heart size is mildly enlarged. The patient is rotated to the left on today's exam, resulting in distortion of the mediastinal contours and reduced diagnostic sensitivity and specificity for mediastinal pathology. Atherosclerosis in the thoracic aorta. IMPRESSION: 1. Support apparatus, as above. Newly placed dialysis catheter tips appear likely located within hepatic vein branches in the liver. Clinical correlation is suggested. 2. Low lung volumes without radiographic evidence of acute cardiopulmonary disease. 3. Mild cardiomegaly. 4. Atherosclerosis. These results will be called to the ordering clinician or representative by the Radiologist Assistant, and communication  documented in the PACS or zVision Dashboard. Electronically Signed   By: Trudie Reed M.D.   On: 04/10/2015 15:13   Dg Fluoro Guide Cv Line-no Report  04/10/2015  CLINICAL DATA:  FLOURO GUIDE CV LINE Fluoroscopy was utilized by  the requesting physician.  No radiographic interpretation.         Subjective: Patient complains of pain in the right upper extremity. Denies fevers, chills, chest pain, shortness breath, nausea, vomiting, diarrhea, abdominal pain. Denies headache or visual disturbance.  Objective: Filed Vitals:   04/13/15 2116 04/14/15 0500 04/14/15 0528 04/14/15 0857  BP: 129/54  163/51 210/84  Pulse: 88  87 90  Temp: 99.4 F (37.4 C)  98.9 F (37.2 C) 98.3 F (36.8 C)  TempSrc: Oral  Oral Oral  Resp: 14  16 16   Height:      Weight: 59.7 kg (131 lb 9.8 oz) 59.7 kg (131 lb 9.8 oz)    SpO2: 100%  100% 100%    Intake/Output Summary (Last 24 hours) at 04/14/15 1616 Last data filed at 04/14/15 1015  Gross per 24 hour  Intake    360 ml  Output      0 ml  Net    360 ml   Weight change: -4.01 kg (-8 lb 13.4 oz) Exam:   General:  Pt is alert, follows commands appropriately, not in acute distress  HEENT: No icterus, No thrush, No neck mass, [place]/AT  Cardiovascular: RRR, S1/S2, no rubs, no gallops  Respiratory: CTA bilaterally, no wheezing, no crackles, no rhonchi  Abdomen: Soft/+BS, non tender, non distended, no guarding  Extremities: No LE edema, No lymphangitis, No petechiae, No rashes, no synovitis; right upper extremity ecchymoses without any crepitance.   Data Reviewed: Basic Metabolic Panel:  Recent Labs Lab 04/09/15 1410  04/10/15 1450 04/10/15 1727 04/11/15 0103 04/11/15 2332 04/13/15 0750  NA 139  < > 138 137 138 136 137  K >7.5*  < > 6.8* 6.5* 4.7 5.0 3.7  CL 100*  < > 105 101 101 98* 99*  CO2 27  < > 17* 25 25 26 28   GLUCOSE 103*  < > 126* 138* 109* 103* 96  BUN 46*  < > 39* 39* 15 26* 10  CREATININE 9.96*  < > 9.08* 9.06* 5.06* 6.57*  4.44*  CALCIUM 9.2  < > 8.6* 8.4* 8.4* 8.1* 8.3*  PHOS 6.2*  --   --   --  4.8* 4.4 3.3  < > = values in this interval not displayed. Liver Function Tests:  Recent Labs Lab 04/08/15 1945 04/09/15 1410 04/11/15 0103 04/11/15 2332 04/13/15 0750  AST 21  --   --   --   --   ALT 10*  --   --   --   --   ALKPHOS 83  --   --   --   --   BILITOT 1.0  --   --   --   --   PROT 7.9  --   --   --   --   ALBUMIN 3.1* 3.0* 2.7* 2.7* 2.5*   No results for input(s): LIPASE, AMYLASE in the last 168 hours. No results for input(s): AMMONIA in the last 168 hours. CBC:  Recent Labs Lab 04/10/15 1450 04/11/15 0103 04/12/15 0508 04/13/15 0436 04/13/15 0750  WBC 6.7 5.8 4.2 4.5 4.4  HGB 9.6* 8.7* 7.7* 7.7* 7.6*  HCT 28.6* 27.7* 24.6* 25.0* 24.1*  MCV 86.1 91.4 86.3 87.7 87.0  PLT 95* 78* 67* 87* 84*   Cardiac Enzymes:  Recent Labs Lab 04/11/15 0103  TROPONINI 0.03   BNP: Invalid input(s): POCBNP CBG:  Recent Labs Lab 04/08/15 2128 04/08/15 2238 04/09/15 1807 04/10/15 1754  GLUCAP 84 86 114* 129*  Recent Results (from the past 240 hour(s))  Wound culture     Status: None   Collection Time: 04/09/15 10:54 AM  Result Value Ref Range Status   Specimen Description WOUND ARM RIGHT  Final   Special Requests Immunocompromised  Final   Gram Stain   Final    NO WBC SEEN NO SQUAMOUS EPITHELIAL CELLS SEEN NO ORGANISMS SEEN Performed at [facility]    Culture   Final    NO GROWTH 2 DAYS Performed at [facility]    Report Status 04/11/2015 FINAL  Final  Culture, blood (routine x 2)     Status: None   Collection Time: 04/09/15  2:10 PM  Result Value Ref Range Status   Specimen Description BLOOD HEMODIALYSIS CATHETER  Final   Special Requests BOTTLES DRAWN AEROBIC AND ANAEROBIC 10CC  Final   Culture NO GROWTH 5 DAYS  Final   Report Status 04/14/2015 FINAL  Final  Culture, blood (routine x 2)     Status: None   Collection Time: 04/09/15  2:43 PM  Result  Value Ref Range Status   Specimen Description BLOOD HEMODIALYSIS CATHETER  Final   Special Requests BOTTLES DRAWN AEROBIC AND ANAEROBIC 10CC  Final   Culture NO GROWTH 5 DAYS  Final   Report Status 04/14/2015 FINAL  Final  Surgical pcr screen     Status: None   Collection Time: 04/09/15 11:38 PM  Result Value Ref Range Status   MRSA, PCR NEGATIVE NEGATIVE Final   Staphylococcus aureus NEGATIVE NEGATIVE Final    Comment:        The Xpert SA Assay (FDA approved for NASAL specimens in patients over 74 years of age), is one component of a comprehensive surveillance program.  Test performance has been validated by [facility] for patients greater than or equal to 39 year old. It is not intended to diagnose infection nor to guide or monitor treatment.   Cath Tip Culture     Status: None   Collection Time: 04/10/15 11:26 AM  Result Value Ref Range Status   Specimen Description CATH TIP  Final   Special Requests DIALYSIS CATH TIP SPEC A PT ON ROCEPHIN  Final   Culture NO GROWTH Performed at [facility]   Final   Report Status 04/13/2015 FINAL  Final  Anaerobic culture     Status: None (Preliminary result)   Collection Time: 04/10/15 12:35 PM  Result Value Ref Range Status   Specimen Description WOUND ARM RIGHT  Final   Special Requests   Final    OPEN WOUND RIGHT UPPER ARM DIALYSIS CATH SITE SPEC B PT ON ROCEPHIN   Gram Stain   Final    MODERATE WBC PRESENT,BOTH PMN AND MONONUCLEAR NO SQUAMOUS EPITHELIAL CELLS SEEN NO ORGANISMS SEEN Performed at [facility]    Culture   Final    NO ANAEROBES ISOLATED; CULTURE IN PROGRESS FOR 5 DAYS Performed at [facility]    Report Status PENDING  Incomplete  Wound culture     Status: None   Collection Time: 04/10/15 12:35 PM  Result Value Ref Range Status   Specimen Description WOUND ARM RIGHT  Final   Special Requests   Final    OPEN WOUND RIGHT UPPER ARM DIALYSIS CATH SITE SPEC B PT ON ROCEPHIN   Gram  Stain   Final    MODERATE WBC PRESENT,BOTH PMN AND MONONUCLEAR NO SQUAMOUS EPITHELIAL CELLS SEEN NO ORGANISMS SEEN Performed at [facility]  Culture   Final    FEW ESCHERICHIA COLI [facility]    Report Status 04/13/2015 FINAL  Final   Organism ID, Bacteria ESCHERICHIA COLI  Final      Susceptibility   Escherichia coli - MIC*    AMPICILLIN >=32 RESISTANT Resistant     AMPICILLIN/SULBACTAM >=32 RESISTANT Resistant     CEFAZOLIN >=64 RESISTANT Resistant     CEFEPIME <=1 SENSITIVE Sensitive     CEFTAZIDIME 8 INTERMEDIATE Intermediate     CEFTRIAXONE 32 RESISTANT Resistant     CIPROFLOXACIN <=0.25 SENSITIVE Sensitive     GENTAMICIN <=1 SENSITIVE Sensitive     IMIPENEM <=0.25 SENSITIVE Sensitive     PIP/TAZO 64 INTERMEDIATE Intermediate     TOBRAMYCIN <=1 SENSITIVE Sensitive     TRIMETH/SULFA Value in next row Sensitive      <=20 SENSITIVE(NOTE)    * FEW ESCHERICHIA COLI  MRSA PCR Screening     Status: None   Collection Time: 04/11/15  1:39 AM  Result Value Ref Range Status   MRSA by PCR NEGATIVE NEGATIVE Final    Comment:        The GeneXpert MRSA Assay (FDA approved for NASAL specimens only), is one component of a comprehensive MRSA colonization surveillance program. It is not intended to diagnose MRSA infection nor to guide or monitor treatment for MRSA infections.   Culture, blood (routine x 2)     Status: None (Preliminary result)   Collection Time: 04/11/15 12:06 PM  Result Value Ref Range Status   Specimen Description BLOOD LEFT HAND  Final   Special Requests IN PEDIATRIC BOTTLE 3CC  Final   Culture NO GROWTH 3 DAYS  Final   Report Status PENDING  Incomplete  Culture, blood (routine x 2)     Status: None (Preliminary result)   Collection Time: 04/11/15 12:12 PM  Result Value Ref Range Status   Specimen Description BLOOD LEFT WRIST  Final   Special Requests IN PEDIATRIC BOTTLE 3CC  Final   Culture NO GROWTH 3 DAYS  Final    Report Status PENDING  Incomplete     Scheduled Meds: . sodium chloride   Intravenous Once  . sodium chloride   Intravenous Once  . amLODipine  2.5 mg Oral Once per day on Sun Mon Wed Fri  . amLODipine  5 mg Oral Once per day on Tue Thu Sat  . aspirin  81 mg Oral Daily  . atorvastatin  20 mg Oral q1800  . carvedilol  3.125 mg Oral BID WC  . cinacalcet  30 mg Oral Q breakfast  . ciprofloxacin  400 mg Intravenous Q24H  . [START ON 04/18/2015] darbepoetin (ARANESP) injection - DIALYSIS  150 mcg Intravenous Q Thu-HD  . dorzolamide-timolol  1 drop Both Eyes Daily  . doxercalciferol  3 mcg Intravenous Q T,Th,Sa-HD  . escitalopram  5 mg Oral Daily  . famotidine  20 mg Oral Daily  . ferric gluconate (FERRLECIT/NULECIT) IV  125 mg Intravenous Q T,Th,Sa-HD  . multivitamin  1 tablet Oral QHS  . sevelamer carbonate  1,600 mg Oral TID WC   Continuous Infusions: . sodium chloride 10 mL/hr at 04/10/15 2248     Dontravious Camille, DO  [facility] Pager [phone]  If 7PM-7AM, please contact night-coverage [email or web address] Password [email or web address] 04/14/2015, 4:16 PM   LOS: 5 days

## 2015-04-14 NOTE — Discharge Summary (Signed)
Physician Discharge Summary  [name] ZOX:[id number] DOB: 1928-06-17 DOA: 04/08/2015  PCP: Eustaquio Boyden, MD  Admit date: 04/08/2015 Discharge date: 04/15/15 Recommendations for Outpatient Follow-up:  1. Pt will need to follow up with PCP in 2 weeks post discharge   Discharge Diagnoses:  Sepsis -Secondary to right upper extremity graft infection -04/10/2015--patient became unresponsive on HD--CODE BLUE called, but patient did not lose pulse  -Blood pressure improved with 500 mL saline  -Blood pressure--improved, now HTN -04/09/15 and11/03/2015 --Repeat blood cultures 2 sets--neg to date -04/11/2015 Vancomycin added by nephrology-->d/c on 11/12  Recurrent UTI? - Continue ceftriaxone. Apparently does not make much urine. -unfortunately no urine culture was sent  RUE AV graft infection - Apparently has been going on for at least 2 months. Has been seen at [facility] but not on antibiotics. Wound draining malodorous pus intermittently. - Appreciate Dr. Allena Katz, Nephrology: Recommended vascular surgery consultation. -appreciate Drs. Chen/Brabham - Continue IV Rocephin for now--surgical cultures preliminary--Escherichia coli -Follow wound culture results--the patient was ready on ceftriaxone which may compromise the patient's surgical cultures -On 03/08/2015, the patient underwent Revision right brachial hero graft with resection of the ulcerated portion; Revision of the PTFE portion and new graft was placed lateral to the ulcerated section -04/10/2015 right femoral tunneled dialysis catheter removed, placement of left femoral tunneled dialysis catheter, removal of right AV graft segment--frank pus was noted in the lumen of the proximal graft-There were also stent struts protruding through the mid segment of the right upper arm graft which were ultimately removed -The right upper extremity graft was removed in its entirety -04/10/15--surgical culture = E.Coli-->start ciprofloxacin, d/c  vanco and d/c ceftriaxone -plan 14 days of ciprofloxacin--switch to po after d/c - plan 12 additional days of ciprofloxacin to complete 14 days of therapy -Home health was set up to assist with wound care dressings until the patient follows up with vascular surgery in the outpatient setting.  Acute blood loss anemia -In part due to the patient's recent surgical procedure -Bloody dressing right upper extremity noted--now with right upper extremity hematoma -04/11/2015--transfused 1 unit PRBC -FOBT -Case discussed with Dr. Mliss Fritz to transfuse 2 additional units on dialysis 04/13/2015 -Remains hemodynamically stable -Monitor hemoglobin--Hgb 10 on day of d/c  Acute encephalopathy -Initially somnolent and obtunded--> now improved--pleasantly confused - Multifactorial including underlying infection, sundowning, and medications -Patient has underlying dementia -Initially patient not cooperating with several aspects of care-refusing lab draws, dialysis initially at time of admission - CT head without acute findings. -Mental status back to baseline for greater than 48 hours prior to discharge  Thrombocytopenia -This is chronic dating back to 07/30/2010 -Acute worsening secondary to infectious process -The patient's quinine is likely contributing to thrombocytopenia--d/c for now -Discontinue heparin -Start SCDs  ESRD on TTS HD/mild hyperkalemia - Management per nephrology-appreciated  Type II DM with renal complications - Diet controlled. Last A1c: 5.3 - DC CBGs and insulin  Hyperlipidemia - Continue statins  GERD - Continue PPI  Essential hypertension - question accuracy of BPs--wide variations -Patient has not consistently hypertensive -Continue home dose amlodipine -carvedilol added---3.125 mg twice a day  Stable calcified right orbital mass, seen on CT head - Unclear etiology. Outpatient follow-up. -07/23/2010 MRI orbits revealed right orbital soft tissue  mass  Discharge Condition: stable  Disposition:  Follow-up Information    Follow up with Leonides Sake, MD In 1 week.   Specialties:  Vascular Surgery, Cardiology   Why:  Our office will call you to arrange an appointment (sent)  Contact information:   [address] [place] [address] [phone]     home   Diet:renal with 1200 cc fluid restriction Wt Readings from Last 3 Encounters:  04/15/15 60 kg (132 lb 4.4 oz)  03/21/15 61.236 kg (135 lb)  03/08/15 60.782 kg (134 lb)    History of present illness:   81 year old female patient with history of ESRD on TTS HD, HTN, diet-controlled DM 2, HLD, GERD, prior staph aureus bacteremia related to HD catheter, anxiety & depression, presented to [facility] ED on 04/09/15 with altered mental status (apparently pretty lucid and self-sufficient at baseline) off 1-2 days duration. Per family, she gets like this when she has a UTI and family also noted infected right upper extremity graft for which she has been following at [facility] not on antibiotics and the wound continues to have foul-smelling drainage. Admitted for acute encephalopathy related to UTI, infected right upper extremity graft. Nephrology consulted. Her last procedure on her right brachial HERO graft was 10/7 by Dr. Lorretta Harp when an ulcerated section was resected. She was seen in his office last week and started on Silvadene cream. The patient completed a two-week course of ceftazidime on 04/06/2015. More recently she had + cultures from arm AVGG 9/27 proteus mirabilis and 10/20 enterobacter cloace treated with Elita Quick x 2 weeks - the last dose was 11/5  After admission, nephrology and vascular surgery were consulted. The patient was taken to surgery on 04/10/2015 where her right arm infected graft was removed in its entirety periodic cultures grew Escherichia coli. The patient was switched to ciprofloxacin.  Consultants: Nephrology [facility]  Discharge Exam: Filed Vitals:   04/15/15 1011   BP: 136/51  Pulse: 74  Temp: 99 F (37.2 C)  Resp: 18   Filed Vitals:   04/14/15 2125 04/15/15 0430 04/15/15 0500 04/15/15 1011  BP: 142/43 174/57  136/51  Pulse: 74 77  74  Temp: 99.4 F (37.4 C) 98.7 F (37.1 C)  99 F (37.2 C)  TempSrc: Oral Oral  Oral  Resp: Height:      Weight: 60 kg (132 lb 4.4 oz)  60 kg (132 lb 4.4 oz)   SpO2: 100% 100%  100%   General: Awake and alert, NAD, pleasant, cooperative Cardiovascular: RRR, no rub, no gallop, no S3 Respiratory: CTAB, no wheeze, no rhonchi Abdomen:soft, nontender, nondistended, positive bowel sounds Extremities: RUE edema with scattered ecchymosis.  Discharge Instructions      Discharge Instructions    Diet - low sodium heart healthy    Complete by:  As directed      Increase activity slowly    Complete by:  As directed             Medication List    STOP taking these medications        cephALEXin 500 MG capsule  Commonly known as:  KEFLEX     loperamide 2 MG capsule  Commonly known as:  IMODIUM     quiNINE 324 MG capsule  Commonly known as:  QUALAQUIN     simvastatin 40 MG tablet  Commonly known as:  ZOCOR      TAKE these medications        acetaminophen 500 MG tablet  Commonly known as:  TYLENOL  Take 500 mg by mouth 2 (two) times daily as needed.     amLODipine 5 MG tablet  Commonly known as:  NORVASC  Take 5 mg by mouth daily. Take half tablet on  Sundays mondays wednesdays and fridays     aspirin 81 MG tablet  Take 81 mg by mouth daily.     atorvastatin 20 MG tablet  Commonly known as:  LIPITOR  Take 1 tablet (20 mg total) by mouth daily at 6 PM.     carvedilol 3.125 MG tablet  Commonly known as:  COREG  Take 1 tablet (3.125 mg total) by mouth 2 (two) times daily with a meal.     cinacalcet 30 MG tablet  Commonly known as:  SENSIPAR  Take 30 mg by mouth daily.     ciprofloxacin 500 MG tablet  Commonly known as:  CIPRO  Take 1 tablet (500 mg total) by mouth daily.      COSOPT OP  Place 1 drop into both eyes daily.     escitalopram 10 MG tablet  Commonly known as:  LEXAPRO  Take 0.5 tablets (5 mg total) by mouth daily.     famotidine 20 MG tablet  Commonly known as:  PEPCID  Take 1 tablet (20 mg total) by mouth 2 (two) times daily.     folic acid-vitamin b complex-vitamin c-selenium-zinc 3 MG Tabs tablet  Take 1 tablet by mouth daily.     HYDROcodone-acetaminophen 5-325 MG tablet  Commonly known as:  NORCO  Take 1-2 tablets by mouth every 6 (six) hours as needed for moderate pain.     multivitamin Tabs tablet  Take 1 tablet by mouth at bedtime.     promethazine 25 MG tablet  Commonly known as:  PHENERGAN  Take 12.5 mg by mouth every 6 (six) hours as needed for nausea or vomiting.     sevelamer carbonate 800 MG tablet  Commonly known as:  RENVELA  Take 2 tablets (1,600 mg total) by mouth 3 (three) times daily with meals.         The results of significant diagnostics from this hospitalization (including imaging, microbiology, ancillary and laboratory) are listed below for reference.    Significant Diagnostic Studies: Dg Chest 2 View  04/08/2015  CLINICAL DATA:  Altered mental status. EXAM: CHEST  2 VIEW COMPARISON:  11/16/2014 FINDINGS: Unchanged large-bore right central venous catheter, tip at the atrial caval junction. Cardiomegaly is unchanged. No consolidation, pleural effusion or pneumothorax. No pulmonary edema. Vascular stent seen in the right arm. The bones are under mineralized. IMPRESSION: Stable cardiomegaly.  No acute pulmonary process. Electronically Signed   By: Rubye Oaks M.D.   On: 04/08/2015 23:46   Ct Head Wo Contrast  04/08/2015  CLINICAL DATA:  Confusion and disorientation today. EXAM: CT HEAD WITHOUT CONTRAST TECHNIQUE: Contiguous axial images were obtained from the base of the skull through the vertex without intravenous contrast. COMPARISON:  11/16/2014 FINDINGS: Stable age related cerebral atrophy,  ventriculomegaly and periventricular white matter disease. No extra-axial fluid collections are identified. No CT findings for acute hemispheric infarction or intracranial hemorrhage. No mass lesions. The brainstem and cerebellum are normal. Stable calcified right orbital mass. No skull fracture or bone lesion. The paranasal sinuses are grossly clear. Mild Stable left-sided ethmoid disease. Remote blow in type fracture of the right orbit is noted. IMPRESSION: 1. Stable age related cerebral atrophy, ventriculomegaly and periventricular white matter disease. No acute intracranial findings. 2. Stable calcified right orbital mass. 3. No acute bony findings. Electronically Signed   By: Rudie Meyer M.D.   On: 04/08/2015 23:41   Dg Chest Port 1 View  04/10/2015  CLINICAL DATA:  81 year old female status post dialysis catheter placement  in the left groin. EXAM: PORTABLE CHEST 1 VIEW COMPARISON:  Chest x-ray 04/08/2015. FINDINGS: New central venous catheter noted from IVC approach with tips projecting over the right lobe of the liver, one medially, and the other laterally, presumably within right hepatic vein branches (but technically indeterminate on today's examination). Regardless, these are not located in the right heart at this time, and one of them is clearly outside of the IVC (the medial one may be within the IVC). Old right-sided subclavian catheter with tip terminating in the superior aspect of the right atrium, similar to the prior examination. Lung volumes are low. No pneumothorax. Mild crowding of the pulmonary vasculature, without frank pulmonary edema. No pleural effusion. Heart size is mildly enlarged. The patient is rotated to the left on today's exam, resulting in distortion of the mediastinal contours and reduced diagnostic sensitivity and specificity for mediastinal pathology. Atherosclerosis in the thoracic aorta. IMPRESSION: 1. Support apparatus, as above. Newly placed dialysis catheter tips appear  likely located within hepatic vein branches in the liver. Clinical correlation is suggested. 2. Low lung volumes without radiographic evidence of acute cardiopulmonary disease. 3. Mild cardiomegaly. 4. Atherosclerosis. These results will be called to the ordering clinician or representative by the Radiologist Assistant, and communication documented in the PACS or zVision Dashboard. Electronically Signed   By: Trudie Reed M.D.   On: 04/10/2015 15:13   Dg Fluoro Guide Cv Line-no Report  04/10/2015  CLINICAL DATA:  FLOURO GUIDE CV LINE Fluoroscopy was utilized by the requesting physician.  No radiographic interpretation.     Microbiology: Recent Results (from the past 240 hour(s))  Wound culture     Status: None   Collection Time: 04/09/15 10:54 AM  Result Value Ref Range Status   Specimen Description WOUND ARM RIGHT  Final   Special Requests Immunocompromised  Final   Gram Stain   Final    NO WBC SEEN NO SQUAMOUS EPITHELIAL CELLS SEEN NO ORGANISMS SEEN Performed at [facility]    Culture   Final    NO GROWTH 2 DAYS Performed at [facility]    Report Status 04/11/2015 FINAL  Final  Culture, blood (routine x 2)     Status: None   Collection Time: 04/09/15  2:10 PM  Result Value Ref Range Status   Specimen Description BLOOD HEMODIALYSIS CATHETER  Final   Special Requests BOTTLES DRAWN AEROBIC AND ANAEROBIC 10CC  Final   Culture NO GROWTH 5 DAYS  Final   Report Status 04/14/2015 FINAL  Final  Culture, blood (routine x 2)     Status: None   Collection Time: 04/09/15  2:43 PM  Result Value Ref Range Status   Specimen Description BLOOD HEMODIALYSIS CATHETER  Final   Special Requests BOTTLES DRAWN AEROBIC AND ANAEROBIC 10CC  Final   Culture NO GROWTH 5 DAYS  Final   Report Status 04/14/2015 FINAL  Final  Surgical pcr screen     Status: None   Collection Time: 04/09/15 11:38 PM  Result Value Ref Range Status   MRSA, PCR NEGATIVE NEGATIVE Final   Staphylococcus  aureus NEGATIVE NEGATIVE Final    Comment:        The Xpert SA Assay (FDA approved for NASAL specimens in patients over 31 years of age), is one component of a comprehensive surveillance program.  Test performance has been validated by [facility] for patients greater than or equal to 39 year old. It is not intended to diagnose infection nor to guide or  monitor treatment.   Cath Tip Culture     Status: None   Collection Time: 04/10/15 11:26 AM  Result Value Ref Range Status   Specimen Description CATH TIP  Final   Special Requests DIALYSIS CATH TIP SPEC A PT ON ROCEPHIN  Final   Culture NO GROWTH Performed at [facility]   Final   Report Status 04/13/2015 FINAL  Final  Anaerobic culture     Status: None   Collection Time: 04/10/15 12:35 PM  Result Value Ref Range Status   Specimen Description WOUND ARM RIGHT  Final   Special Requests   Final    OPEN WOUND RIGHT UPPER ARM DIALYSIS CATH SITE SPEC B PT ON ROCEPHIN   Gram Stain   Final    MODERATE WBC PRESENT,BOTH PMN AND MONONUCLEAR NO SQUAMOUS EPITHELIAL CELLS SEEN NO ORGANISMS SEEN Performed at [facility]    Culture   Final    NO ANAEROBES ISOLATED Performed at [facility]    Report Status 04/15/2015 FINAL  Final  Wound culture     Status: None   Collection Time: 04/10/15 12:35 PM  Result Value Ref Range Status   Specimen Description WOUND ARM RIGHT  Final   Special Requests   Final    OPEN WOUND RIGHT UPPER ARM DIALYSIS CATH SITE SPEC B PT ON ROCEPHIN   Gram Stain   Final    MODERATE WBC PRESENT,BOTH PMN AND MONONUCLEAR NO SQUAMOUS EPITHELIAL CELLS SEEN NO ORGANISMS SEEN Performed at [facility]    Culture   Final    FEW ESCHERICHIA COLI Performed at [facility]    Report Status 04/13/2015 FINAL  Final   Organism ID, Bacteria ESCHERICHIA COLI  Final      Susceptibility   Escherichia coli - MIC*    AMPICILLIN >=32 RESISTANT Resistant     AMPICILLIN/SULBACTAM  >=32 RESISTANT Resistant     CEFAZOLIN >=64 RESISTANT Resistant     CEFEPIME <=1 SENSITIVE Sensitive     CEFTAZIDIME 8 INTERMEDIATE Intermediate     CEFTRIAXONE 32 RESISTANT Resistant     CIPROFLOXACIN <=0.25 SENSITIVE Sensitive     GENTAMICIN <=1 SENSITIVE Sensitive     IMIPENEM <=0.25 SENSITIVE Sensitive     PIP/TAZO 64 INTERMEDIATE Intermediate     TOBRAMYCIN <=1 SENSITIVE Sensitive     TRIMETH/SULFA Value in next row Sensitive      <=20 SENSITIVE(NOTE)    * FEW ESCHERICHIA COLI  MRSA PCR Screening     Status: None   Collection Time: 04/11/15  1:39 AM  Result Value Ref Range Status   MRSA by PCR NEGATIVE NEGATIVE Final    Comment:        The GeneXpert MRSA Assay (FDA approved for NASAL specimens only), is one component of a comprehensive MRSA colonization surveillance program. It is not intended to diagnose MRSA infection nor to guide or monitor treatment for MRSA infections.   Culture, blood (routine x 2)     Status: None (Preliminary result)   Collection Time: 04/11/15 12:06 PM  Result Value Ref Range Status   Specimen Description BLOOD LEFT HAND  Final   Special Requests IN PEDIATRIC BOTTLE 3CC  Final   Culture NO GROWTH 3 DAYS  Final   Report Status PENDING  Incomplete  Culture, blood (routine x 2)     Status: None (Preliminary result)   Collection Time: 04/11/15 12:12 PM  Result Value Ref Range Status   Specimen Description BLOOD LEFT WRIST  Final   Special Requests IN PEDIATRIC BOTTLE 3CC  Final   Culture NO GROWTH 3 DAYS  Final   Report Status PENDING  Incomplete     Labs: Basic Metabolic Panel:  Recent Labs Lab 04/09/15 1410  04/10/15 1727 04/11/15 0103 04/11/15 2332 04/13/15 0750 04/15/15 0539  NA 139  < > 137 138 136 137 133*  K >7.5*  < > 6.5* 4.7 5.0 3.7 3.7  CL 100*  < > 101 101 98* 99* 96*  CO2 27  < > 25 25 26 28 30   GLUCOSE 103*  < > 138* 109* 103* 96 98  BUN 46*  < > 39* 15 26* 10 12  CREATININE 9.96*  < > 9.06* 5.06* 6.57* 4.44* 5.16*    CALCIUM 9.2  < > 8.4* 8.4* 8.1* 8.3* 8.8*  PHOS 6.2*  --   --  4.8* 4.4 3.3  --   < > = values in this interval not displayed. Liver Function Tests:  Recent Labs Lab 04/08/15 1945 04/09/15 1410 04/11/15 0103 04/11/15 2332 04/13/15 0750  AST 21  --   --   --   --   ALT 10*  --   --   --   --   ALKPHOS 83  --   --   --   --   BILITOT 1.0  --   --   --   --   PROT 7.9  --   --   --   --   ALBUMIN 3.1* 3.0* 2.7* 2.7* 2.5*   No results for input(s): LIPASE, AMYLASE in the last 168 hours. No results for input(s): AMMONIA in the last 168 hours. CBC:  Recent Labs Lab 04/11/15 0103 04/12/15 0508 04/13/15 0436 04/13/15 0750 04/15/15 0539  WBC 5.8 4.2 4.5 4.4 4.8  HGB 8.7* 7.7* 7.7* 7.6* 10.0*  HCT 27.7* 24.6* 25.0* 24.1* 30.6*  MCV 91.4 86.3 87.7 87.0 86.2  PLT 78* 67* 87* 84* 95*   Cardiac Enzymes:  Recent Labs Lab 04/11/15 0103  TROPONINI 0.03   BNP: Invalid input(s): POCBNP CBG:  Recent Labs Lab 04/08/15 2128 04/08/15 2238 04/09/15 1807 04/10/15 1754  GLUCAP 84 86 114* 129*    Time coordinating discharge:  Greater than 30 minutes  Signed:  Alexsia Klindt, DO [facility] Pager: [phone] 04/15/2015, 11:16 AM

## 2015-04-14 NOTE — Progress Notes (Signed)
   Daily Progress Note  Assessment/Planning: POD #4 s/p s/p exc of infected old RUA AVG   Wet-to-dry dressing to right open wound BID  Abx per primary team  Expect this will take the patient 1-2 months to heal up  She can follow up with us until this wound is healed and then continue her longitudinal care with her surgeon in [place]  Subjective  - 4 Days Post-Op No complaints  Objective Filed Vitals:   04/13/15 1804 04/13/15 2116 04/14/15 0500 04/14/15 0528  BP: 158/69 129/54  163/51  Pulse: 87 88  87  Temp: 98.9 F (37.2 C) 99.4 F (37.4 C)  98.9 F (37.2 C)  TempSrc: Oral Oral  Oral  Resp: 18 14  16   Height:      Weight:  131 lb 9.8 oz (59.7 kg) 131 lb 9.8 oz (59.7 kg)   SpO2: 100% 100%  100%    Intake/Output Summary (Last 24 hours) at 04/14/15 0848 Last data filed at 04/13/15 2000  Gross per 24 hour  Intake    974 ml  Output   1701 ml  Net   -727 ml    VASC  No fresh clots on packing removed, no frank pus, no smell, mild erythema around staples, skin claps appear viable  Laboratory CBC    Component Value Date/Time   WBC 4.4 04/13/2015 0750   HGB 7.6* 04/13/2015 0750   HCT 24.1* 04/13/2015 0750   PLT 84* 04/13/2015 0750    BMET    Component Value Date/Time   NA 137 04/13/2015 0750   K 3.7 04/13/2015 0750   CL 99* 04/13/2015 0750   CO2 28 04/13/2015 0750   GLUCOSE 96 04/13/2015 0750   GLUCOSE 111 02/07/2009   GLUCOSE 246* 05/28/2006 1229   BUN 10 04/13/2015 0750   CREATININE 4.44* 04/13/2015 0750   CALCIUM 8.3* 04/13/2015 0750   CALCIUM 9.6 07/18/2012 1051   GFRNONAA 8* 04/13/2015 0750   GFRAA 10* 04/13/2015 0750    Michelle SakeBrian Marque Rademaker, MD [facility] Office: [phone] Pager: [phone]  04/14/2015, 8:48 AM

## 2015-04-14 NOTE — Progress Notes (Signed)
Patient ID: [name], female   DOB: [birth year], 81 y.o.   MRN: [id number]  [facility] Progress Note   Assessment/ Plan:   1. Right groin wound from venotomy site nonhealing (CBC) and infected/exposed RUA AVG stents:  Status post removal of infected graft segment with exposed stent struts as well as removal of right femoral tunneled dialysis catheter and repair of venotomy site. On Vancomycin and plans for wound care per vascular surgery-anticipated protracted healing. 2. ESRD TTS: Next hemodialysis due Tuesday, no acute dialysis needs noted at this time 3. Hypertension/volume -elevated blood pressures noted, euvolemic on physical exam. Currently on monotherapy with amlodipine-we'll start low-dose beta blocker (hold in a.m. of dialysis) 4. Anemia -low but stable hemoglobin -from surgical site losses as well as hematoma, getting PRBCs at HD 5. Metabolic bone disease - Continue Hectorol, sensipar 30 and binders- 2 renvela ac 6. Nutrition - continue renal diet-oral intake improving following the improvement of her mental status. 7.  AMS- Head CT negative - appears to have been multifactorial delirium, now resolved  Subjective:   Reports tolerable pain of right arm-both at surgical site/forearm hematoma.    Objective:   BP 210/84 mmHg  Pulse 90  Temp(Src) 98.3 F (36.8 C) (Oral)  Resp 16  Ht 4\' 10"  (1.473 m)  Wt 59.7 kg (131 lb 9.8 oz)  BMI 27.51 kg/m2  SpO2 100%  Physical Exam: Gen: resting comfortably in recliner, husband at bedside CVS: Pulse regular rate and rhythm, S1 and S2 normal Resp: Clear to auscultation, no rales Abd: Soft, obese, nontender Ext: Clean and dry dressing right upper arm with intact clean staples. Hematoma noted of the right forearm-not larger than yesterday. Left femoral tunneled dialysis catheter  Labs: BMET  Recent Labs Lab 04/09/15 1410 04/10/15 0410 04/10/15 1450 04/10/15 1727 04/11/15 0103 04/11/15 2332 04/13/15 0750  NA  139 137 138 137 138 136 137  K >7.5* 5.2* 6.8* 6.5* 4.7 5.0 3.7  CL 100* 100* 105 101 101 98* 99*  CO2 27 25 17* 25 25 26 28   GLUCOSE 103* 101* 126* 138* 109* 103* 96  BUN 46* 34* 39* 39* 15 26* 10  CREATININE 9.96* 8.45* 9.08* 9.06* 5.06* 6.57* 4.44*  CALCIUM 9.2 8.9 8.6* 8.4* 8.4* 8.1* 8.3*  PHOS 6.2*  --   --   --  4.8* 4.4 3.3   CBC  Recent Labs Lab 04/11/15 0103 04/12/15 0508 04/13/15 0436 04/13/15 0750  WBC 5.8 4.2 4.5 4.4  HGB 8.7* 7.7* 7.7* 7.6*  HCT 27.7* 24.6* 25.0* 24.1*  MCV 91.4 86.3 87.7 87.0  PLT 78* 67* 87* 84*   Medications:    . sodium chloride   Intravenous Once  . sodium chloride   Intravenous Once  . amLODipine  2.5 mg Oral Once per day on Sun Mon Wed Fri  . amLODipine  5 mg Oral Once per day on Tue Thu Sat  . aspirin  81 mg Oral Daily  . atorvastatin  20 mg Oral q1800  . cinacalcet  30 mg Oral Q breakfast  . ciprofloxacin  400 mg Intravenous Q24H  . [START ON 04/18/2015] darbepoetin (ARANESP) injection - DIALYSIS  150 mcg Intravenous Q Thu-HD  . dorzolamide-timolol  1 drop Both Eyes Daily  . doxercalciferol  3 mcg Intravenous Q T,Th,Sa-HD  . escitalopram  5 mg Oral Daily  . famotidine  20 mg Oral Daily  . ferric gluconate (FERRLECIT/NULECIT) IV  125 mg Intravenous Q T,Th,Sa-HD  . multivitamin  1  tablet Oral QHS  . sevelamer carbonate  1,600 mg Oral TID WC   Zetta Bills, MD 04/14/2015, 9:07 AM

## 2015-04-15 LAB — CBC
HEMATOCRIT: 30.6 % — AB (ref 36.0–46.0)
HEMOGLOBIN: 10 g/dL — AB (ref 12.0–15.0)
MCH: 28.2 pg (ref 26.0–34.0)
MCHC: 32.7 g/dL (ref 30.0–36.0)
MCV: 86.2 fL (ref 78.0–100.0)
Platelets: 95 10*3/uL — ABNORMAL LOW (ref 150–400)
RBC: 3.55 MIL/uL — AB (ref 3.87–5.11)
RDW: 18 % — ABNORMAL HIGH (ref 11.5–15.5)
WBC: 4.8 10*3/uL (ref 4.0–10.5)

## 2015-04-15 LAB — ANAEROBIC CULTURE

## 2015-04-15 LAB — BASIC METABOLIC PANEL
ANION GAP: 7 (ref 5–15)
BUN: 12 mg/dL (ref 6–20)
CHLORIDE: 96 mmol/L — AB (ref 101–111)
CO2: 30 mmol/L (ref 22–32)
Calcium: 8.8 mg/dL — ABNORMAL LOW (ref 8.9–10.3)
Creatinine, Ser: 5.16 mg/dL — ABNORMAL HIGH (ref 0.44–1.00)
GFR calc Af Amer: 8 mL/min — ABNORMAL LOW (ref >60)
GFR calc non Af Amer: 7 mL/min — ABNORMAL LOW (ref >60)
GLUCOSE: 98 mg/dL (ref 65–99)
POTASSIUM: 3.7 mmol/L (ref 3.5–5.1)
Sodium: 133 mmol/L — ABNORMAL LOW (ref 135–145)

## 2015-04-15 MED ORDER — CIPROFLOXACIN HCL 500 MG PO TABS: 500.0000 mg | ORAL_TABLET | ORAL | Status: DC

## 2015-04-15 MED ORDER — CARVEDILOL 3.125 MG PO TABS: 3.1250 mg | ORAL_TABLET | Freq: Two times a day (BID) | ORAL | Status: DC

## 2015-04-15 MED ORDER — HYDROCODONE-ACETAMINOPHEN 5-325 MG PO TABS: 1.0000 | ORAL_TABLET | Freq: Four times a day (QID) | ORAL | Status: DC | PRN

## 2015-04-15 NOTE — Progress Notes (Signed)
Discharge instructions reviewed with the patient and her daughter,  Discussed medications, prescriptions,  Follow up appointments and wound care.  Copies of discharge instructions given to the patient.  Both voice understanding to teaching.  To door via wheelchair. Home via POV with her daughter driving.

## 2015-04-15 NOTE — Progress Notes (Signed)
Physical Therapy Treatment Patient Details Name: [name] MRN: [id number] DOB: 1929-03-04 Today's Date: 04/15/2015    History of Present Illness 81 year old female patient with history of ESRD on TTS HD, HTN, diet-controlled DM 2, HLD, GERD, prior staph aureus bacteremia related to HD catheter, anxiety & depression, presented to [facility] ED on 04/09/15 with altered mental status. Admitted for sepsis with RUE AV graft infection.    PT Comments    Pt progressing well towards all goals. Pt with increased ambulation this date and was able to complete stair negotiation. Pt safe to d/c home once medically stable.  Follow Up Recommendations  Home health PT;Supervision for mobility/OOB     Equipment Recommendations  Rolling walker with 5" wheels    Recommendations for Other Services       Precautions / Restrictions Precautions Precautions: Fall Restrictions Weight Bearing Restrictions: No    Mobility  Bed Mobility Overal bed mobility: Modified Independent             General bed mobility comments: extra time, hob elevated  Transfers Overall transfer level: Needs assistance Equipment used: Rolling walker (2 wheeled) Transfers: Sit to/from Stand Sit to Stand: Min guard         General transfer comment: increased time, v/c's for safe hand placement  Ambulation/Gait Ambulation/Gait assistance: Min guard Ambulation Distance (Feet): 175 Feet Assistive device: Rolling walker (2 wheeled) Gait Pattern/deviations: Step-through pattern;Decreased stride length (decreased step height) Gait velocity: slow Gait velocity interpretation: <1.8 ft/sec, indicative of risk for recurrent falls General Gait Details: very slow, v/c's to look up and ahead, assist for walker management during turns   Stairs Stairs: Yes Stairs assistance: Min assist Stair Management: Two rails;Step to pattern Number of Stairs: 4 General stair comments: increased time  Wheelchair Mobility     Modified Rankin (Stroke Patients Only)       Balance Overall balance assessment: Needs assistance Sitting-balance support: Feet supported;No upper extremity supported Sitting balance-Leahy Scale: Good     Standing balance support: No upper extremity supported Standing balance-Leahy Scale: Fair                      Cognition Arousal/Alertness: Awake/alert Behavior During Therapy: WFL for tasks assessed/performed Overall Cognitive Status: Within Functional Limits for tasks assessed                      Exercises      General Comments        Pertinent Vitals/Pain Pain Assessment: No/denies pain    Home Living                      Prior Function            PT Goals (current goals can now be found in the care plan section) Progress towards PT goals: Progressing toward goals    Frequency  Min 3X/week    PT Plan Current plan remains appropriate    Co-evaluation             End of Session Equipment Utilized During Treatment: Gait belt Activity Tolerance: Patient tolerated treatment well Patient left: in chair;with call bell/phone within reach;with chair alarm set;with family/visitor present     Time: 1308-6578 PT Time Calculation (min) (ACUTE ONLY): 24 min  Charges:  $Gait Training: 23-37 mins                    G Codes:  [name], [name] 04/15/2015, 2:02 PM   [name], PT, DPT Pager #: [phone] Office #: [phone]

## 2015-04-15 NOTE — Progress Notes (Signed)
Upon assessment noticed that pink restricted arm band was on the left upper extremity. Patients right upper extremity is the site of dialysis access. Discussed with MD for VVS. Patients pink arm band was placed on the right upper extremity and taken off the left.

## 2015-04-15 NOTE — Progress Notes (Signed)
[facility] Progress Note  Subjective  - POD #5  No complaints.   Objective Filed Vitals:   04/15/15 0430  BP: 174/57  Pulse: 77  Temp: 98.7 F (37.1 C)  Resp: 16    Intake/Output Summary (Last 24 hours) at 04/15/15 16100832 Last data filed at 04/14/15 2000  Gross per 24 hour  Intake    480 ml  Output      0 ml  Net    480 ml    Right upper arm dressing removed. Serosanguinous drainage on bandage. Wound is clean, red granulation tissue, no pus.   Assessment/Planning: 81 y.o. female is s/p: excision of infected old right upper arm AVG 5 Days Post-Op   Wound is clean.  Continue bid wet to dry NS dressings to right upper arm open wound.  Wound culture growing E.coli. Abx per primary. Ok to d/c from vascular standpoint.  We will continue to follow her wound as an outpatient. F/u in 1 week.   Raymond GurneyKimberly A Lorali Khamis 04/15/2015 8:32 AM --  Laboratory CBC    Component Value Date/Time   WBC 4.8 04/15/2015 0539   HGB 10.0* 04/15/2015 0539   HCT 30.6* 04/15/2015 0539   PLT 95* 04/15/2015 0539    BMET    Component Value Date/Time   NA 133* 04/15/2015 0539   K 3.7 04/15/2015 0539   CL 96* 04/15/2015 0539   CO2 30 04/15/2015 0539   GLUCOSE 98 04/15/2015 0539   GLUCOSE 111 02/07/2009   GLUCOSE 246* 05/28/2006 1229   BUN 12 04/15/2015 0539   CREATININE 5.16* 04/15/2015 0539   CALCIUM 8.8* 04/15/2015 0539   CALCIUM 9.6 07/18/2012 1051   GFRNONAA 7* 04/15/2015 0539   GFRAA 8* 04/15/2015 0539    COAG Lab Results  Component Value Date   INR 1.30 07/25/2013   INR 1.25 07/05/2010   No results found for: PTT  Antibiotics Anti-infectives    Start     Dose/Rate Route Frequency Ordered Stop   04/15/15 1800  ciprofloxacin (CIPRO) tablet 500 mg     500 mg Oral Every 24 hours 04/14/15 1755     04/13/15 1700  ciprofloxacin (CIPRO) IVPB 400 mg  Status:  Discontinued     400 mg 200 mL/hr over 60 Minutes Intravenous Every 24 hours 04/13/15 1537 04/14/15  1755   04/11/15 1800  vancomycin (VANCOCIN) 500 mg in sodium chloride 0.9 % 100 mL IVPB  Status:  Discontinued     500 mg 100 mL/hr over 60 Minutes Intravenous Every T-Th-Sa (Hemodialysis) 04/11/15 1054 04/13/15 1447   04/11/15 1100  vancomycin (VANCOCIN) 1,250 mg in sodium chloride 0.9 % 250 mL IVPB     1,250 mg 166.7 mL/hr over 90 Minutes Intravenous  Once 04/11/15 1054 04/11/15 1335   04/09/15 1600  cefTRIAXone (ROCEPHIN) 2 g in dextrose 5 % 50 mL IVPB  Status:  Discontinued     2 g 100 mL/hr over 30 Minutes Intravenous Every 24 hours 04/09/15 1451 04/13/15 1446   04/09/15 1000  quiNINE (QUALAQUIN) capsule 324 mg  Status:  Discontinued     324 mg Oral Once per day on Tue Thu Sat 04/09/15 0519 04/11/15 1256   04/09/15 0045  cefTRIAXone (ROCEPHIN) 1 g in dextrose 5 % 50 mL IVPB     1 g 100 mL/hr over 30 Minutes Intravenous  Once 04/09/15 0041 04/09/15 0330       Maris BergerKimberly Victoriya Pol, PA-C [facility] Office: [phone] Pager: [phone] 04/15/2015 8:32  AM

## 2015-04-15 NOTE — Care Management Important Message (Signed)
Important Message  Patient Details  Name: Cleaster CorinJosie W Veselka MRN: [id number] Date of Birth: 1929-01-25   Medicare Important Message Given:  Yes    Joash Tony P Talan Gildner 04/15/2015, 3:23 PM

## 2015-04-16 ENCOUNTER — Telehealth: Payer: Self-pay | Admitting: *Deleted

## 2015-04-16 ENCOUNTER — Telehealth: Payer: Self-pay | Admitting: Vascular Surgery

## 2015-04-16 DIAGNOSIS — D509 Iron deficiency anemia, unspecified: Secondary | ICD-10-CM | POA: Diagnosis not present

## 2015-04-16 DIAGNOSIS — A047 Enterocolitis due to Clostridium difficile: Secondary | ICD-10-CM | POA: Diagnosis not present

## 2015-04-16 DIAGNOSIS — D631 Anemia in chronic kidney disease: Secondary | ICD-10-CM | POA: Diagnosis not present

## 2015-04-16 DIAGNOSIS — G9340 Encephalopathy, unspecified: Secondary | ICD-10-CM | POA: Diagnosis not present

## 2015-04-16 DIAGNOSIS — D689 Coagulation defect, unspecified: Secondary | ICD-10-CM | POA: Diagnosis not present

## 2015-04-16 DIAGNOSIS — N186 End stage renal disease: Secondary | ICD-10-CM | POA: Diagnosis not present

## 2015-04-16 LAB — CULTURE, BLOOD (ROUTINE X 2)
CULTURE: NO GROWTH
Culture: NO GROWTH

## 2015-04-16 NOTE — Telephone Encounter (Signed)
-----   Message from Sharee PimpleMarilyn K McChesney, RN sent at 04/15/2015  9:17 AM EST ----- Regarding: schedule   ----- Message -----    From: Raymond GurneyKimberly A Trinh, PA-C    Sent: 04/15/2015   8:36 AM      To: Vvs Charge Pool  S/p excision of infected old RUA AVG 04/10/15  Please add to [facility]'s schedule this Friday for PA to see (1:00 pm or later).   Thanks Selena BattenKim

## 2015-04-16 NOTE — Telephone Encounter (Signed)
Transitional care call attempted.  Left message for patient's daughter Dedra SkeensGwen to return call.  DPR signed.

## 2015-04-16 NOTE — Telephone Encounter (Signed)
LM for pt re appt, dpm °

## 2015-04-17 DIAGNOSIS — E785 Hyperlipidemia, unspecified: Secondary | ICD-10-CM | POA: Diagnosis not present

## 2015-04-17 DIAGNOSIS — T827XXA Infection and inflammatory reaction due to other cardiac and vascular devices, implants and grafts, initial encounter: Secondary | ICD-10-CM | POA: Diagnosis not present

## 2015-04-17 DIAGNOSIS — M1991 Primary osteoarthritis, unspecified site: Secondary | ICD-10-CM | POA: Diagnosis not present

## 2015-04-17 DIAGNOSIS — D509 Iron deficiency anemia, unspecified: Secondary | ICD-10-CM | POA: Diagnosis not present

## 2015-04-17 DIAGNOSIS — I120 Hypertensive chronic kidney disease with stage 5 chronic kidney disease or end stage renal disease: Secondary | ICD-10-CM | POA: Diagnosis not present

## 2015-04-17 DIAGNOSIS — F419 Anxiety disorder, unspecified: Secondary | ICD-10-CM | POA: Diagnosis not present

## 2015-04-17 DIAGNOSIS — H547 Unspecified visual loss: Secondary | ICD-10-CM | POA: Diagnosis not present

## 2015-04-17 DIAGNOSIS — I951 Orthostatic hypotension: Secondary | ICD-10-CM | POA: Diagnosis not present

## 2015-04-17 DIAGNOSIS — Z9181 History of falling: Secondary | ICD-10-CM | POA: Diagnosis not present

## 2015-04-17 DIAGNOSIS — K219 Gastro-esophageal reflux disease without esophagitis: Secondary | ICD-10-CM | POA: Diagnosis not present

## 2015-04-17 DIAGNOSIS — F329 Major depressive disorder, single episode, unspecified: Secondary | ICD-10-CM | POA: Diagnosis not present

## 2015-04-17 DIAGNOSIS — Z48812 Encounter for surgical aftercare following surgery on the circulatory system: Secondary | ICD-10-CM | POA: Diagnosis not present

## 2015-04-17 DIAGNOSIS — E1122 Type 2 diabetes mellitus with diabetic chronic kidney disease: Secondary | ICD-10-CM | POA: Diagnosis not present

## 2015-04-17 DIAGNOSIS — N390 Urinary tract infection, site not specified: Secondary | ICD-10-CM | POA: Diagnosis not present

## 2015-04-17 DIAGNOSIS — B9620 Unspecified Escherichia coli [E. coli] as the cause of diseases classified elsewhere: Secondary | ICD-10-CM | POA: Diagnosis not present

## 2015-04-17 DIAGNOSIS — R413 Other amnesia: Secondary | ICD-10-CM | POA: Diagnosis not present

## 2015-04-17 DIAGNOSIS — Z792 Long term (current) use of antibiotics: Secondary | ICD-10-CM | POA: Diagnosis not present

## 2015-04-17 DIAGNOSIS — N186 End stage renal disease: Secondary | ICD-10-CM | POA: Diagnosis not present

## 2015-04-18 ENCOUNTER — Encounter: Payer: Self-pay | Admitting: Vascular Surgery

## 2015-04-18 DIAGNOSIS — D631 Anemia in chronic kidney disease: Secondary | ICD-10-CM | POA: Diagnosis not present

## 2015-04-18 DIAGNOSIS — D509 Iron deficiency anemia, unspecified: Secondary | ICD-10-CM | POA: Diagnosis not present

## 2015-04-18 DIAGNOSIS — A047 Enterocolitis due to Clostridium difficile: Secondary | ICD-10-CM | POA: Diagnosis not present

## 2015-04-18 DIAGNOSIS — N186 End stage renal disease: Secondary | ICD-10-CM | POA: Diagnosis not present

## 2015-04-18 DIAGNOSIS — D689 Coagulation defect, unspecified: Secondary | ICD-10-CM | POA: Diagnosis not present

## 2015-04-18 DIAGNOSIS — T827XXA Infection and inflammatory reaction due to other cardiac and vascular devices, implants and grafts, initial encounter: Secondary | ICD-10-CM | POA: Diagnosis not present

## 2015-04-18 DIAGNOSIS — N390 Urinary tract infection, site not specified: Secondary | ICD-10-CM | POA: Diagnosis not present

## 2015-04-18 DIAGNOSIS — G9340 Encephalopathy, unspecified: Secondary | ICD-10-CM | POA: Diagnosis not present

## 2015-04-18 DIAGNOSIS — Z48812 Encounter for surgical aftercare following surgery on the circulatory system: Secondary | ICD-10-CM | POA: Diagnosis not present

## 2015-04-18 DIAGNOSIS — I120 Hypertensive chronic kidney disease with stage 5 chronic kidney disease or end stage renal disease: Secondary | ICD-10-CM | POA: Diagnosis not present

## 2015-04-18 DIAGNOSIS — B9620 Unspecified Escherichia coli [E. coli] as the cause of diseases classified elsewhere: Secondary | ICD-10-CM | POA: Diagnosis not present

## 2015-04-18 DIAGNOSIS — E1122 Type 2 diabetes mellitus with diabetic chronic kidney disease: Secondary | ICD-10-CM | POA: Diagnosis not present

## 2015-04-18 NOTE — Telephone Encounter (Signed)
Transition Care Management Follow-up Telephone Call   Date discharged? 04/15/15   How have you been since you were released from the hospital? Still weak with some confusion but improved.   Do you understand why you were in the hospital? yes   Do you understand the discharge instructions? yes   Where were you discharged to? Home   Items Reviewed:  Medications reviewed: yes  Allergies reviewed: yes  Dietary changes reviewed: no  Referrals reviewed: yes, cardiology/vascular surgery   Functional Questionnaire:   Activities of Daily Living (ADLs):   She states they are independent in the following: Spoke to daughter, unable to answer States they require assistance with the following: Spoke to daughter, unable to answer   Any transportation issues/concerns?: no   Any patient concerns? no   Confirmed importance and date/time of follow-up visits scheduled yes, 04/22/15 @ 1100  Provider Appointment booked with Vernona RiegerKatherine Clark, NP (PCP not available)  Confirmed with patient if condition begins to worsen call PCP or go to the ER.  Patient was given the office number and encouraged to call back with question or concerns.  : yes

## 2015-04-19 ENCOUNTER — Ambulatory Visit (INDEPENDENT_AMBULATORY_CARE_PROVIDER_SITE_OTHER): Payer: Medicare Other | Admitting: Vascular Surgery

## 2015-04-19 ENCOUNTER — Encounter: Payer: Self-pay | Admitting: Vascular Surgery

## 2015-04-19 VITALS — BP 151/66 | HR 76 | Temp 98.1°F | Resp 14 | Ht <= 58 in | Wt 135.0 lb

## 2015-04-19 DIAGNOSIS — Z992 Dependence on renal dialysis: Secondary | ICD-10-CM

## 2015-04-19 DIAGNOSIS — N186 End stage renal disease: Secondary | ICD-10-CM

## 2015-04-19 DIAGNOSIS — Z48812 Encounter for surgical aftercare following surgery on the circulatory system: Secondary | ICD-10-CM | POA: Diagnosis not present

## 2015-04-19 DIAGNOSIS — E1122 Type 2 diabetes mellitus with diabetic chronic kidney disease: Secondary | ICD-10-CM | POA: Diagnosis not present

## 2015-04-19 DIAGNOSIS — T827XXD Infection and inflammatory reaction due to other cardiac and vascular devices, implants and grafts, subsequent encounter: Secondary | ICD-10-CM

## 2015-04-19 DIAGNOSIS — B9620 Unspecified Escherichia coli [E. coli] as the cause of diseases classified elsewhere: Secondary | ICD-10-CM | POA: Diagnosis not present

## 2015-04-19 DIAGNOSIS — I120 Hypertensive chronic kidney disease with stage 5 chronic kidney disease or end stage renal disease: Secondary | ICD-10-CM | POA: Diagnosis not present

## 2015-04-19 DIAGNOSIS — N390 Urinary tract infection, site not specified: Secondary | ICD-10-CM | POA: Diagnosis not present

## 2015-04-19 DIAGNOSIS — T827XXA Infection and inflammatory reaction due to other cardiac and vascular devices, implants and grafts, initial encounter: Secondary | ICD-10-CM | POA: Diagnosis not present

## 2015-04-19 NOTE — Progress Notes (Signed)
    Postoperative Visit   History of Present Illness  [name] is a 81 y.o. year old female who presents for postoperative follow-up for: removal of right arteriovenous graft segment (Date: 04/10/15) due to eroded nonfunctional graft with purulent drainage. She also had removal of a right femoral tunneled dialysis catheter and placement of a new left femoral tunneled dialysis catheter at that time.  She currently has a right brachial HeROgraft on 03/08/15 placed by Dr. Lorretta HarpSchneir.  A 3 cm open segment of her incision was left open. Her wound cultures grew E.coli and she was discharged home on ciprofloxacin.  The patient's wounds are healing well. Her daughter is performing twice daily wet to dry dressings and a home health RN is coming once a week for evaluation.      Physical Examination Filed Vitals:   04/19/15 1427  BP: 151/66  Pulse: 76  Temp:   Resp:     RUE: Incision is stapled distally and proximally. Staple line is clean. Middle open wound is clean with red granulation tissue. HeRO graft laterally.   Medical Decision Making  [name] is a 81 y.o. year old female who presents s/p removal of right arm arteriovenous graft segment, removal of right thigh TDC and placement of left thigh TDC.    The patient's wound is healing well. Continue twice daily wet to dry dressings. Discussed that this wound will take approximately a month to heal.   Continue on current antibiotics.   Follow up on 05/01/15 for wound re-check and staple removal.   Maris BergerKimberly Romulo Okray, PA-C [facility] [place] Office: [phone]   04/19/2015, 3:03 PM   Addendum  I have independently interviewed and examined the patient, and I agree with the physician assistant's findings.  Staple line intact, skin appears to be viable, distal tunnel appears to be closing, likely some residual clot in that wound given some fullness, proximally no obvious fullness, wound is cleaned.   Additionally some echymosis in dorsum of forearm likely run down to upper arm wound.  Ballotable fluid on volar surface of forearm noted, suspect this is related to some type of trauma unrelated to the procedure.  Leonides SakeBrian Chen, MD [facility] [place] Office: [phone] Pager: [phone]  04/19/2015, 3:28 PM

## 2015-04-19 NOTE — Progress Notes (Signed)
Filed Vitals:   04/19/15 1423 04/19/15 1427  BP: 152/68 151/66  Pulse: 81 76  Temp: 98.1 F (36.7 C)   Resp: 14   Height: 4\' 10"  (1.473 m)   Weight: 135 lb (61.236 kg)   SpO2: 100%

## 2015-04-20 DIAGNOSIS — A047 Enterocolitis due to Clostridium difficile: Secondary | ICD-10-CM | POA: Diagnosis not present

## 2015-04-20 DIAGNOSIS — D509 Iron deficiency anemia, unspecified: Secondary | ICD-10-CM | POA: Diagnosis not present

## 2015-04-20 DIAGNOSIS — D631 Anemia in chronic kidney disease: Secondary | ICD-10-CM | POA: Diagnosis not present

## 2015-04-20 DIAGNOSIS — D689 Coagulation defect, unspecified: Secondary | ICD-10-CM | POA: Diagnosis not present

## 2015-04-20 DIAGNOSIS — N186 End stage renal disease: Secondary | ICD-10-CM | POA: Diagnosis not present

## 2015-04-20 DIAGNOSIS — G9340 Encephalopathy, unspecified: Secondary | ICD-10-CM | POA: Diagnosis not present

## 2015-04-22 ENCOUNTER — Encounter: Payer: Self-pay | Admitting: Primary Care

## 2015-04-22 ENCOUNTER — Ambulatory Visit (INDEPENDENT_AMBULATORY_CARE_PROVIDER_SITE_OTHER): Payer: Medicare Other | Admitting: Primary Care

## 2015-04-22 VITALS — BP 158/62 | HR 99 | Temp 98.8°F | Wt 131.0 lb

## 2015-04-22 DIAGNOSIS — I120 Hypertensive chronic kidney disease with stage 5 chronic kidney disease or end stage renal disease: Secondary | ICD-10-CM | POA: Diagnosis not present

## 2015-04-22 DIAGNOSIS — N390 Urinary tract infection, site not specified: Secondary | ICD-10-CM

## 2015-04-22 DIAGNOSIS — Z48812 Encounter for surgical aftercare following surgery on the circulatory system: Secondary | ICD-10-CM | POA: Diagnosis not present

## 2015-04-22 DIAGNOSIS — Z09 Encounter for follow-up examination after completed treatment for conditions other than malignant neoplasm: Secondary | ICD-10-CM

## 2015-04-22 DIAGNOSIS — E1122 Type 2 diabetes mellitus with diabetic chronic kidney disease: Secondary | ICD-10-CM | POA: Diagnosis not present

## 2015-04-22 DIAGNOSIS — T827XXA Infection and inflammatory reaction due to other cardiac and vascular devices, implants and grafts, initial encounter: Secondary | ICD-10-CM | POA: Diagnosis not present

## 2015-04-22 DIAGNOSIS — B9620 Unspecified Escherichia coli [E. coli] as the cause of diseases classified elsewhere: Secondary | ICD-10-CM | POA: Diagnosis not present

## 2015-04-22 NOTE — Patient Instructions (Signed)
Continue antibiotics until complete.  Continue lipitor and carvedilol as prescribed in the hospital.  Follow up with Dr. Imogene Burnhen as scheduled.  Follow up with Dr. Sharen HonesGutierrez as scheduled.  Please call me if you notice redness, swelling, drainage of pus to your arm.  It was a pleasure meeting you!

## 2015-04-22 NOTE — Progress Notes (Signed)
Subjective:    Patient ID: [name], female    DOB: 13-Oct-1928, 81 y.o.   MRN: [id number]  HPI  [name] is an 81 year old female who presents today for transitional care hospital follow up. She presented to the emergency department on 04/08/15 with a chief complaint of altered mental status (per her daughter) since 04/07/15. She had also become incontinent of stool, and had an infected right upper extremity graft with pus like drainage. Her family believed she had a UTI as she's exhibited these symptoms in the past with UTI's.   She was admitted with a UTI, right upper extremity graft infection causing sepsis, acute encephalopathy. She underwent surgery for removal of ligated PTFE section of graft on 04/10/15. She became unresponsive during HD on 04/10/15 without loss of pulse. She improved with an introduction of a saline bolus. She was discharged on 04/14/15 with instructions for close follow up with vascular surgery.   She is currently managed on ciprofloxacin for her resolving graft infection, has home health set up to assist with dressing changes, and PT to assist with recovery. During her hospitalization Carvedilol 3.125 mg and atorvastatin 20 mg were added to her medication regimen. She's not had her medications this morning.  Since her discharge she's feeling well. Her daughter mentions transient confusion but overall improvement. Her daughter is concerned with her recurrent UTI's. Her UTI's occur three times a year on average, but she's recently had them more frequently. The patient cannot come to the clinic for testing of urine as she has to be catheterized for a sample. Each time she suspects UTI she has to take her mother to the emergency department which she does not prefer. Her daughter is requesting for her PCP to send an antibiotic with symptoms alone as she's very familiar with her mothers symptoms of UTI.   She followed with Dr. Imogene Burn (vascular surgery) last Friday who  reported that her right upper extremity is looking good overall. She has another appointment scheduled on 05/01/15 for re-evaluation. She's doing HD on Tues, Thurs, Saturday. Denies fevers, chills, chest pain, near syncope, weakness, upper extremity pain.   Review of Systems  Constitutional: Negative for fever.  Respiratory: Negative for shortness of breath.   Cardiovascular: Negative for chest pain.  Skin: Positive for wound.  Neurological: Negative for syncope.       Past Medical History  Diagnosis Date  . Cough   . Diabetes mellitus     type 2  . GERD (gastroesophageal reflux disease)   . Hypertension   . Bronchitis   . Osteoarthritis   . Dyslipidemia     remote hx/notes 10/06/2009  . ESRD (end stage renal disease) on dialysis [facility])     Dr Coladonato/Powell  . Depression     lexapro  . Staphylococcus aureus bacteremia with sepsis (HCC)     thought from HD catheter  . Anxiety     Social History   Social History  . Marital Status: Married    Spouse Name: N/A  . Number of Children: N/A  . Years of Education: N/A   Occupational History  . Not on file.   Social History Main Topics  . Smoking status: Never Smoker   . Smokeless tobacco: Not on file  . Alcohol Use: No  . Drug Use: No  . Sexual Activity: No   Other Topics Concern  . Not on file   Social History Narrative    Past Surgical History  Procedure  Laterality Date  . Eye surgery      Cataract extraction  . Thrombectomy and revision of arterioventous (av) goretex  graft Left 12/2004; 01/2006; 03/12/2009    forearmnotes 10/13/2010; forearmnotes 10/13/2010; upper arm/notes 03/19/2009  . Abdominal hysterectomy      remote hx/notes 10/06/2009  . Vitrectomy Left 03/2001    with membrane peel/notes 10/13/2010  . Arteriovenous graft placement Left 07/2004    forearm/notes 10/13/2010  . Arteriovenous graft placement Left 03/2006    upper armnotes 10/13/2010  . Peripheral vascular catheterization Right 02/18/2015     Procedure: A/V Shuntogram/Fistulagram;  Surgeon: Annice Needy, MD;  Location: [facility] INVASIVE CV LAB;  Service: Cardiovascular;  Laterality: Right;  . Peripheral vascular catheterization N/A 02/18/2015    Procedure: A/V Shunt Intervention;  Surgeon: Annice Needy, MD;  Location: [facility] INVASIVE CV LAB;  Service: Cardiovascular;  Laterality: N/A;  . Peripheral vascular catheterization N/A 03/06/2015    Procedure: Dialysis/Perma Catheter Insertion;  Surgeon: Annice Needy, MD;  Location: [facility] INVASIVE CV LAB;  Service: Cardiovascular;  Laterality: N/A;  . Revision of arteriovenous goretex graft Right 03/08/2015    Procedure: REVISION OF ARTERIOVENOUS GORETEX GRAFT;  Surgeon: Renford Dills, MD;  Location: [facility] ORS;  Service: Vascular;  Laterality: Right;  . Avgg removal Right 04/10/2015    Procedure: REMOVAL OF RIGHT ARM  ARTERIOVENOUS GORETEX GRAFT (AVGG);  Surgeon: Fransisco Hertz, MD;  Location: [facility] OR;  Service: Vascular;  Laterality: Right;  . Insertion of dialysis catheter Left 04/10/2015    Procedure: INSERTION OF DIALYSIS CATHETER LEFT GROIN;  Surgeon: Fransisco Hertz, MD;  Location: [facility] OR;  Service: Vascular;  Laterality: Left;  . Removal of a dialysis catheter Right 04/10/2015    Procedure: REMOVAL OF RIGHT GROIN DIALYSIS CATHETER;  Surgeon: Fransisco Hertz, MD;  Location: [facility] OR;  Service: Vascular;  Laterality: Right;    Family History  Problem Relation Age of Onset  . Hypertension Mother     No Known Allergies  Current Outpatient Prescriptions on File Prior to Visit  Medication Sig Dispense Refill  . acetaminophen (TYLENOL) 500 MG tablet Take 500 mg by mouth 2 (two) times daily as needed.    [name] amLODipine (NORVASC) 5 MG tablet Take 5 mg by mouth daily. Take half tablet on Sundays mondays wednesdays and fridays    . aspirin 81 MG tablet Take 81 mg by mouth daily.    [name] atorvastatin (LIPITOR) 20 MG tablet Take 1 tablet (20 mg total) by mouth daily at 6 PM. 30 tablet 1  . carvedilol (COREG) 3.125 MG  tablet Take 1 tablet (3.125 mg total) by mouth 2 (two) times daily with a meal. 60 tablet 0  . cinacalcet (SENSIPAR) 30 MG tablet Take 30 mg by mouth daily.    . ciprofloxacin (CIPRO) 500 MG tablet Take 1 tablet (500 mg total) by mouth daily. 12 tablet 0  . Dorzolamide HCl-Timolol Mal (COSOPT OP) Place 1 drop into both eyes daily.     [name] escitalopram (LEXAPRO) 10 MG tablet Take 0.5 tablets (5 mg total) by mouth daily. 45 tablet 1  . famotidine (PEPCID) 20 MG tablet Take 1 tablet (20 mg total) by mouth 2 (two) times daily. (Patient taking differently: Take 20 mg by mouth daily. ) 60 tablet 0  . folic acid-vitamin b complex-vitamin c-selenium-zinc (DIALYVITE) 3 MG TABS Take 1 tablet by mouth daily.      [name] HYDROcodone-acetaminophen (NORCO) 5-325 MG tablet Take 1-2 tablets by mouth every 6 (  six) hours as needed for moderate pain. 30 tablet 0  . multivitamin (RENA-VIT) TABS tablet Take 1 tablet by mouth at bedtime. 90 tablet 0  . quiNINE (QUALAQUIN) 324 MG capsule Take 324 mg by mouth 3 (three) times a week. Taking on Tuesday, Thursday, and Saturday per daughter    . sevelamer carbonate (RENVELA) 800 MG tablet Take 2 tablets (1,600 mg total) by mouth 3 (three) times daily with meals. 180 tablet 1  . promethazine (PHENERGAN) 25 MG tablet Take 12.5 mg by mouth every 6 (six) hours as needed for nausea or vomiting.     No current facility-administered medications on file prior to visit.    BP 158/62 mmHg  Pulse 99  Temp(Src) 98.8 F (37.1 C) (Oral)  Wt 131 lb (59.421 kg)  SpO2 99%    Objective:   Physical Exam  Constitutional: She is oriented to person, place, and time. She appears well-nourished.  Cardiovascular: Normal rate and regular rhythm.   Pulmonary/Chest: Effort normal and breath sounds normal.  Neurological: She is alert and oriented to person, place, and time.  Skin: Skin is warm and dry.  Right upper extremity with staples in place, no s/s of infection. No drainage. Non tender.  Dressing re-applied.  Psychiatric: She has a normal mood and affect.          Assessment & Plan:  Hospital Follow Up:  Admitted on 04/08/15 for AMS secondary to sepsis due to infection of right upper extremity graft. Underwent surgery for removal of part of graft on 11/09. Also treated for UTI. Discharged 04/14/15.  Overall doing well since discharge. Home health and PT currently in place. She's had follow up with vascular surgery and right upper extremity wound is healing well. She's continuing her cipro as prescribed in the hospital. Carvedilol and atorvastatin were added during hospitalization and is currently taking at home along with other meds. Right upper extremity appears to be healing well. No s/s of infection.  Discussed importance of follow up with vascular surgery in November and PCP in February as scheduled. Will mention to PCP about daughters concern regarding UTI treatment and prevention for ED visits.  RTC as needed. All hospital labs, noted and imaging were reviewed.

## 2015-04-23 ENCOUNTER — Telehealth: Payer: Self-pay

## 2015-04-23 DIAGNOSIS — D631 Anemia in chronic kidney disease: Secondary | ICD-10-CM | POA: Diagnosis not present

## 2015-04-23 DIAGNOSIS — D689 Coagulation defect, unspecified: Secondary | ICD-10-CM | POA: Diagnosis not present

## 2015-04-23 DIAGNOSIS — N186 End stage renal disease: Secondary | ICD-10-CM | POA: Diagnosis not present

## 2015-04-23 DIAGNOSIS — A047 Enterocolitis due to Clostridium difficile: Secondary | ICD-10-CM | POA: Diagnosis not present

## 2015-04-23 DIAGNOSIS — G9340 Encephalopathy, unspecified: Secondary | ICD-10-CM | POA: Diagnosis not present

## 2015-04-23 DIAGNOSIS — D509 Iron deficiency anemia, unspecified: Secondary | ICD-10-CM | POA: Diagnosis not present

## 2015-04-23 NOTE — Telephone Encounter (Signed)
She was treated with 2 powerful antibiotics in the hospital. What are her mother's current symptoms?

## 2015-04-23 NOTE — Telephone Encounter (Signed)
[name] pts daughter (DPR signed)left v/m; pt was seen 04/22/15; Dedra SkeensGwen does not think Cipro is helping the UTI and request different abx to [facility] [place]. Michelle request cb.

## 2015-04-24 ENCOUNTER — Telehealth: Payer: Self-pay | Admitting: Primary Care

## 2015-04-24 DIAGNOSIS — N390 Urinary tract infection, site not specified: Secondary | ICD-10-CM

## 2015-04-24 MED ORDER — CEPHALEXIN 250 MG PO CAPS: 250.0000 mg | ORAL_CAPSULE | Freq: Two times a day (BID) | ORAL | Status: DC

## 2015-04-24 NOTE — Telephone Encounter (Signed)
Tried to call patient at home phone but someone kept hanging up the phones. Called patient's daughter on her mobile number and patient's daughter stated that the patient is currently on antibiotics now. Patient seem to be still having some confusion and irritable. Denies any other UTI symptoms.

## 2015-04-24 NOTE — Telephone Encounter (Addendum)
Called and notified patient's daughter of Kate's comments. Patient verbalized understanding.

## 2015-04-24 NOTE — Telephone Encounter (Signed)
Patient's daughter,[name],returned Chan's call.  Please call her back at [phone].

## 2015-04-24 NOTE — Telephone Encounter (Signed)
Please notify [name] and her daughter that I've sent in a different antibiotic to her pharmacy. She will start Cephalexin 250 mg capsules. She will take 1 capsule by mouth twice daily for 7 days. We will call and check in on her Monday morning next week. Please have her take her mother to the Emergency Department if her confusion becomes worse over the weekend.

## 2015-04-26 DIAGNOSIS — I120 Hypertensive chronic kidney disease with stage 5 chronic kidney disease or end stage renal disease: Secondary | ICD-10-CM | POA: Diagnosis not present

## 2015-04-26 DIAGNOSIS — D509 Iron deficiency anemia, unspecified: Secondary | ICD-10-CM | POA: Diagnosis not present

## 2015-04-26 DIAGNOSIS — D689 Coagulation defect, unspecified: Secondary | ICD-10-CM | POA: Diagnosis not present

## 2015-04-26 DIAGNOSIS — A047 Enterocolitis due to Clostridium difficile: Secondary | ICD-10-CM | POA: Diagnosis not present

## 2015-04-26 DIAGNOSIS — B9620 Unspecified Escherichia coli [E. coli] as the cause of diseases classified elsewhere: Secondary | ICD-10-CM | POA: Diagnosis not present

## 2015-04-26 DIAGNOSIS — N186 End stage renal disease: Secondary | ICD-10-CM | POA: Diagnosis not present

## 2015-04-26 DIAGNOSIS — E1122 Type 2 diabetes mellitus with diabetic chronic kidney disease: Secondary | ICD-10-CM | POA: Diagnosis not present

## 2015-04-26 DIAGNOSIS — T827XXA Infection and inflammatory reaction due to other cardiac and vascular devices, implants and grafts, initial encounter: Secondary | ICD-10-CM | POA: Diagnosis not present

## 2015-04-26 DIAGNOSIS — G9340 Encephalopathy, unspecified: Secondary | ICD-10-CM | POA: Diagnosis not present

## 2015-04-26 DIAGNOSIS — N390 Urinary tract infection, site not specified: Secondary | ICD-10-CM | POA: Diagnosis not present

## 2015-04-26 DIAGNOSIS — Z48812 Encounter for surgical aftercare following surgery on the circulatory system: Secondary | ICD-10-CM | POA: Diagnosis not present

## 2015-04-26 DIAGNOSIS — D631 Anemia in chronic kidney disease: Secondary | ICD-10-CM | POA: Diagnosis not present

## 2015-04-28 DIAGNOSIS — D509 Iron deficiency anemia, unspecified: Secondary | ICD-10-CM | POA: Diagnosis not present

## 2015-04-28 DIAGNOSIS — N186 End stage renal disease: Secondary | ICD-10-CM | POA: Diagnosis not present

## 2015-04-28 DIAGNOSIS — D631 Anemia in chronic kidney disease: Secondary | ICD-10-CM | POA: Diagnosis not present

## 2015-04-28 DIAGNOSIS — D689 Coagulation defect, unspecified: Secondary | ICD-10-CM | POA: Diagnosis not present

## 2015-04-28 DIAGNOSIS — A047 Enterocolitis due to Clostridium difficile: Secondary | ICD-10-CM | POA: Diagnosis not present

## 2015-04-28 DIAGNOSIS — G9340 Encephalopathy, unspecified: Secondary | ICD-10-CM | POA: Diagnosis not present

## 2015-04-29 ENCOUNTER — Encounter: Payer: Self-pay | Admitting: Vascular Surgery

## 2015-04-29 ENCOUNTER — Telehealth: Payer: Self-pay | Admitting: Primary Care

## 2015-04-29 DIAGNOSIS — I120 Hypertensive chronic kidney disease with stage 5 chronic kidney disease or end stage renal disease: Secondary | ICD-10-CM | POA: Diagnosis not present

## 2015-04-29 DIAGNOSIS — B9620 Unspecified Escherichia coli [E. coli] as the cause of diseases classified elsewhere: Secondary | ICD-10-CM | POA: Diagnosis not present

## 2015-04-29 DIAGNOSIS — Z48812 Encounter for surgical aftercare following surgery on the circulatory system: Secondary | ICD-10-CM | POA: Diagnosis not present

## 2015-04-29 DIAGNOSIS — N390 Urinary tract infection, site not specified: Secondary | ICD-10-CM | POA: Diagnosis not present

## 2015-04-29 DIAGNOSIS — T827XXA Infection and inflammatory reaction due to other cardiac and vascular devices, implants and grafts, initial encounter: Secondary | ICD-10-CM | POA: Diagnosis not present

## 2015-04-29 DIAGNOSIS — E1122 Type 2 diabetes mellitus with diabetic chronic kidney disease: Secondary | ICD-10-CM | POA: Diagnosis not present

## 2015-04-29 NOTE — Telephone Encounter (Signed)
Tried to call patient's daughter and phone kept ringing then went silent.  

## 2015-04-29 NOTE — Telephone Encounter (Signed)
Tried to call patient's daughter and phone kept ringing then went silent.

## 2015-04-29 NOTE — Telephone Encounter (Signed)
Message left for patient to return my call.  

## 2015-04-29 NOTE — Telephone Encounter (Signed)
Will you please check on [name]? Her daughter called last week reporting increased confusion. I sent in a new antibiotic for presumed UTI. Thanks.

## 2015-04-30 ENCOUNTER — Telehealth: Payer: Self-pay | Admitting: Primary Care

## 2015-04-30 DIAGNOSIS — A047 Enterocolitis due to Clostridium difficile: Secondary | ICD-10-CM | POA: Diagnosis not present

## 2015-04-30 DIAGNOSIS — D509 Iron deficiency anemia, unspecified: Secondary | ICD-10-CM | POA: Diagnosis not present

## 2015-04-30 DIAGNOSIS — N186 End stage renal disease: Secondary | ICD-10-CM | POA: Diagnosis not present

## 2015-04-30 DIAGNOSIS — G9340 Encephalopathy, unspecified: Secondary | ICD-10-CM | POA: Diagnosis not present

## 2015-04-30 DIAGNOSIS — D631 Anemia in chronic kidney disease: Secondary | ICD-10-CM | POA: Diagnosis not present

## 2015-04-30 DIAGNOSIS — D689 Coagulation defect, unspecified: Secondary | ICD-10-CM | POA: Diagnosis not present

## 2015-04-30 NOTE — Telephone Encounter (Signed)
Called and asked patient's daughter how the patient is doing. Patient's daughter stated that patient is doing much better now.

## 2015-05-01 ENCOUNTER — Ambulatory Visit (INDEPENDENT_AMBULATORY_CARE_PROVIDER_SITE_OTHER): Payer: Medicare Other | Admitting: Vascular Surgery

## 2015-05-01 ENCOUNTER — Encounter: Payer: Self-pay | Admitting: Vascular Surgery

## 2015-05-01 VITALS — BP 140/67 | HR 73 | Temp 99.4°F | Resp 14 | Ht 61.0 in | Wt 130.0 lb

## 2015-05-01 DIAGNOSIS — N390 Urinary tract infection, site not specified: Secondary | ICD-10-CM | POA: Diagnosis not present

## 2015-05-01 DIAGNOSIS — T827XXA Infection and inflammatory reaction due to other cardiac and vascular devices, implants and grafts, initial encounter: Secondary | ICD-10-CM | POA: Diagnosis not present

## 2015-05-01 DIAGNOSIS — N186 End stage renal disease: Secondary | ICD-10-CM | POA: Diagnosis not present

## 2015-05-01 DIAGNOSIS — I120 Hypertensive chronic kidney disease with stage 5 chronic kidney disease or end stage renal disease: Secondary | ICD-10-CM | POA: Diagnosis not present

## 2015-05-01 DIAGNOSIS — Z48812 Encounter for surgical aftercare following surgery on the circulatory system: Secondary | ICD-10-CM | POA: Diagnosis not present

## 2015-05-01 DIAGNOSIS — E1129 Type 2 diabetes mellitus with other diabetic kidney complication: Secondary | ICD-10-CM | POA: Diagnosis not present

## 2015-05-01 DIAGNOSIS — Z992 Dependence on renal dialysis: Secondary | ICD-10-CM | POA: Diagnosis not present

## 2015-05-01 DIAGNOSIS — B9620 Unspecified Escherichia coli [E. coli] as the cause of diseases classified elsewhere: Secondary | ICD-10-CM | POA: Diagnosis not present

## 2015-05-01 DIAGNOSIS — E1122 Type 2 diabetes mellitus with diabetic chronic kidney disease: Secondary | ICD-10-CM | POA: Diagnosis not present

## 2015-05-01 NOTE — Anesthesia Postprocedure Evaluation (Signed)
Anesthesia Post Note  Patient: Cleaster CorinJosie W Hartsough  Procedure(s) Performed: Procedure(s) (LRB): REMOVAL OF RIGHT ARM  ARTERIOVENOUS GORETEX GRAFT (AVGG) (Right) INSERTION OF DIALYSIS CATHETER LEFT GROIN (Left) REMOVAL OF RIGHT GROIN DIALYSIS CATHETER (Right)  Patient location during evaluation: PACU Anesthesia Type: General Level of consciousness: awake and alert Pain management: pain level controlled Vital Signs Assessment: post-procedure vital signs reviewed and stable Respiratory status: spontaneous breathing Cardiovascular status: stable Anesthetic complications: no    Last Vitals:  Filed Vitals:   04/15/15 0430 04/15/15 1011  BP: 174/57 136/51  Pulse: 77 74  Temp: 37.1 C 37.2 C  Resp: 16 18    Last Pain:  Filed Vitals:   04/15/15 1012  PainSc: 0-No pain                 Haiven Nardone,JAMES TERRILL

## 2015-05-01 NOTE — Progress Notes (Signed)
    Postoperative Access Visit   History of Present Illness  Angelita InglesJosie W Fransisca ConnorsGatson is a 81 y.o. year old female who presents for postoperative follow-up for: Excision of R UA infected AVG segment (Date: 04/10/15).  The patient's wounds are healing.  The patient notes no steal symptoms.  The patient is able to complete their activities of daily living.  The patient's current symptoms are: none.  For VQI Use Only  PRE-ADM LIVING: Home  AMB STATUS: Ambulatory  Physical Examination Filed Vitals:   05/01/15 1308  BP: 140/67  Pulse: 73  Temp: 99.4 F (37.4 C)  Resp: 14    RUE: Incision is nearly healed with central area nearly healed to the level of skin, staples in place with segment healing well, skin feels warm, hand grip is 5/5, sensation in digits is intact, palpable thrill in lateral segment, bruit can be auscultated   Medical Decision Making  Cleaster CorinJosie W Kuyper is a 81 y.o. year old female who presents s/p Excision of R UA infected AVG segment.   I suspect the patient will be healed in the next 2-4 weeks.   Continue wet-to-dry dressing BID.  Follow for last wound check in 4 weeks.  Pt will revert back [facility] once the wound is healed.  Thank you for allowing us to participate in this patient's care.  Leonides SakeBrian Chen, MD [facility] Office: [phone] Pager: [phone]  05/01/2015, 1:28 PM

## 2015-05-02 DIAGNOSIS — N186 End stage renal disease: Secondary | ICD-10-CM | POA: Diagnosis not present

## 2015-05-02 DIAGNOSIS — D631 Anemia in chronic kidney disease: Secondary | ICD-10-CM | POA: Diagnosis not present

## 2015-05-02 DIAGNOSIS — T82818A Embolism of vascular prosthetic devices, implants and grafts, initial encounter: Secondary | ICD-10-CM | POA: Diagnosis not present

## 2015-05-02 DIAGNOSIS — N2581 Secondary hyperparathyroidism of renal origin: Secondary | ICD-10-CM | POA: Diagnosis not present

## 2015-05-02 DIAGNOSIS — D689 Coagulation defect, unspecified: Secondary | ICD-10-CM | POA: Diagnosis not present

## 2015-05-02 DIAGNOSIS — N390 Urinary tract infection, site not specified: Secondary | ICD-10-CM

## 2015-05-02 HISTORY — DX: Urinary tract infection, site not specified: N39.0

## 2015-05-03 DIAGNOSIS — B9620 Unspecified Escherichia coli [E. coli] as the cause of diseases classified elsewhere: Secondary | ICD-10-CM | POA: Diagnosis not present

## 2015-05-03 DIAGNOSIS — I120 Hypertensive chronic kidney disease with stage 5 chronic kidney disease or end stage renal disease: Secondary | ICD-10-CM | POA: Diagnosis not present

## 2015-05-03 DIAGNOSIS — N390 Urinary tract infection, site not specified: Secondary | ICD-10-CM | POA: Diagnosis not present

## 2015-05-03 DIAGNOSIS — T827XXA Infection and inflammatory reaction due to other cardiac and vascular devices, implants and grafts, initial encounter: Secondary | ICD-10-CM | POA: Diagnosis not present

## 2015-05-03 DIAGNOSIS — E1122 Type 2 diabetes mellitus with diabetic chronic kidney disease: Secondary | ICD-10-CM | POA: Diagnosis not present

## 2015-05-03 DIAGNOSIS — Z48812 Encounter for surgical aftercare following surgery on the circulatory system: Secondary | ICD-10-CM | POA: Diagnosis not present

## 2015-05-04 DIAGNOSIS — N2581 Secondary hyperparathyroidism of renal origin: Secondary | ICD-10-CM | POA: Diagnosis not present

## 2015-05-04 DIAGNOSIS — D689 Coagulation defect, unspecified: Secondary | ICD-10-CM | POA: Diagnosis not present

## 2015-05-04 DIAGNOSIS — T82818A Embolism of vascular prosthetic devices, implants and grafts, initial encounter: Secondary | ICD-10-CM | POA: Diagnosis not present

## 2015-05-04 DIAGNOSIS — D631 Anemia in chronic kidney disease: Secondary | ICD-10-CM | POA: Diagnosis not present

## 2015-05-04 DIAGNOSIS — N186 End stage renal disease: Secondary | ICD-10-CM | POA: Diagnosis not present

## 2015-05-06 DIAGNOSIS — Z48812 Encounter for surgical aftercare following surgery on the circulatory system: Secondary | ICD-10-CM | POA: Diagnosis not present

## 2015-05-06 DIAGNOSIS — T827XXA Infection and inflammatory reaction due to other cardiac and vascular devices, implants and grafts, initial encounter: Secondary | ICD-10-CM | POA: Diagnosis not present

## 2015-05-06 DIAGNOSIS — N390 Urinary tract infection, site not specified: Secondary | ICD-10-CM | POA: Diagnosis not present

## 2015-05-06 DIAGNOSIS — B9620 Unspecified Escherichia coli [E. coli] as the cause of diseases classified elsewhere: Secondary | ICD-10-CM | POA: Diagnosis not present

## 2015-05-06 DIAGNOSIS — I120 Hypertensive chronic kidney disease with stage 5 chronic kidney disease or end stage renal disease: Secondary | ICD-10-CM | POA: Diagnosis not present

## 2015-05-06 DIAGNOSIS — E1122 Type 2 diabetes mellitus with diabetic chronic kidney disease: Secondary | ICD-10-CM | POA: Diagnosis not present

## 2015-05-07 DIAGNOSIS — N2581 Secondary hyperparathyroidism of renal origin: Secondary | ICD-10-CM | POA: Diagnosis not present

## 2015-05-07 DIAGNOSIS — D631 Anemia in chronic kidney disease: Secondary | ICD-10-CM | POA: Diagnosis not present

## 2015-05-07 DIAGNOSIS — T82818A Embolism of vascular prosthetic devices, implants and grafts, initial encounter: Secondary | ICD-10-CM | POA: Diagnosis not present

## 2015-05-07 DIAGNOSIS — D689 Coagulation defect, unspecified: Secondary | ICD-10-CM | POA: Diagnosis not present

## 2015-05-07 DIAGNOSIS — N186 End stage renal disease: Secondary | ICD-10-CM | POA: Diagnosis not present

## 2015-05-08 DIAGNOSIS — N390 Urinary tract infection, site not specified: Secondary | ICD-10-CM | POA: Diagnosis not present

## 2015-05-08 DIAGNOSIS — I120 Hypertensive chronic kidney disease with stage 5 chronic kidney disease or end stage renal disease: Secondary | ICD-10-CM | POA: Diagnosis not present

## 2015-05-08 DIAGNOSIS — B9620 Unspecified Escherichia coli [E. coli] as the cause of diseases classified elsewhere: Secondary | ICD-10-CM | POA: Diagnosis not present

## 2015-05-08 DIAGNOSIS — T827XXA Infection and inflammatory reaction due to other cardiac and vascular devices, implants and grafts, initial encounter: Secondary | ICD-10-CM | POA: Diagnosis not present

## 2015-05-08 DIAGNOSIS — E1122 Type 2 diabetes mellitus with diabetic chronic kidney disease: Secondary | ICD-10-CM | POA: Diagnosis not present

## 2015-05-08 DIAGNOSIS — Z48812 Encounter for surgical aftercare following surgery on the circulatory system: Secondary | ICD-10-CM | POA: Diagnosis not present

## 2015-05-09 DIAGNOSIS — T82818A Embolism of vascular prosthetic devices, implants and grafts, initial encounter: Secondary | ICD-10-CM | POA: Diagnosis not present

## 2015-05-09 DIAGNOSIS — T827XXA Infection and inflammatory reaction due to other cardiac and vascular devices, implants and grafts, initial encounter: Secondary | ICD-10-CM | POA: Diagnosis not present

## 2015-05-09 DIAGNOSIS — N2581 Secondary hyperparathyroidism of renal origin: Secondary | ICD-10-CM | POA: Diagnosis not present

## 2015-05-09 DIAGNOSIS — D689 Coagulation defect, unspecified: Secondary | ICD-10-CM | POA: Diagnosis not present

## 2015-05-09 DIAGNOSIS — E1122 Type 2 diabetes mellitus with diabetic chronic kidney disease: Secondary | ICD-10-CM | POA: Diagnosis not present

## 2015-05-09 DIAGNOSIS — D631 Anemia in chronic kidney disease: Secondary | ICD-10-CM | POA: Diagnosis not present

## 2015-05-09 DIAGNOSIS — I120 Hypertensive chronic kidney disease with stage 5 chronic kidney disease or end stage renal disease: Secondary | ICD-10-CM | POA: Diagnosis not present

## 2015-05-09 DIAGNOSIS — N186 End stage renal disease: Secondary | ICD-10-CM | POA: Diagnosis not present

## 2015-05-09 DIAGNOSIS — Z48812 Encounter for surgical aftercare following surgery on the circulatory system: Secondary | ICD-10-CM | POA: Diagnosis not present

## 2015-05-09 DIAGNOSIS — N390 Urinary tract infection, site not specified: Secondary | ICD-10-CM | POA: Diagnosis not present

## 2015-05-09 DIAGNOSIS — B9620 Unspecified Escherichia coli [E. coli] as the cause of diseases classified elsewhere: Secondary | ICD-10-CM | POA: Diagnosis not present

## 2015-05-11 DIAGNOSIS — N186 End stage renal disease: Secondary | ICD-10-CM | POA: Diagnosis not present

## 2015-05-11 DIAGNOSIS — D689 Coagulation defect, unspecified: Secondary | ICD-10-CM | POA: Diagnosis not present

## 2015-05-11 DIAGNOSIS — N2581 Secondary hyperparathyroidism of renal origin: Secondary | ICD-10-CM | POA: Diagnosis not present

## 2015-05-11 DIAGNOSIS — T82818A Embolism of vascular prosthetic devices, implants and grafts, initial encounter: Secondary | ICD-10-CM | POA: Diagnosis not present

## 2015-05-11 DIAGNOSIS — D631 Anemia in chronic kidney disease: Secondary | ICD-10-CM | POA: Diagnosis not present

## 2015-05-13 ENCOUNTER — Other Ambulatory Visit: Payer: Self-pay | Admitting: Family Medicine

## 2015-05-13 DIAGNOSIS — E1122 Type 2 diabetes mellitus with diabetic chronic kidney disease: Secondary | ICD-10-CM | POA: Diagnosis not present

## 2015-05-13 DIAGNOSIS — B9620 Unspecified Escherichia coli [E. coli] as the cause of diseases classified elsewhere: Secondary | ICD-10-CM | POA: Diagnosis not present

## 2015-05-13 DIAGNOSIS — I120 Hypertensive chronic kidney disease with stage 5 chronic kidney disease or end stage renal disease: Secondary | ICD-10-CM | POA: Diagnosis not present

## 2015-05-13 DIAGNOSIS — Z48812 Encounter for surgical aftercare following surgery on the circulatory system: Secondary | ICD-10-CM | POA: Diagnosis not present

## 2015-05-13 DIAGNOSIS — N390 Urinary tract infection, site not specified: Secondary | ICD-10-CM | POA: Diagnosis not present

## 2015-05-13 DIAGNOSIS — T827XXA Infection and inflammatory reaction due to other cardiac and vascular devices, implants and grafts, initial encounter: Secondary | ICD-10-CM | POA: Diagnosis not present

## 2015-05-14 ENCOUNTER — Encounter (HOSPITAL_COMMUNITY): Payer: Self-pay

## 2015-05-14 ENCOUNTER — Inpatient Hospital Stay (HOSPITAL_COMMUNITY)
Admission: EM | Admit: 2015-05-14 | Discharge: 2015-05-19 | DRG: 689 | Disposition: A | Discharge status: Home / Self Care | Payer: Medicare Other | Attending: Internal Medicine | Admitting: Internal Medicine

## 2015-05-14 DIAGNOSIS — D689 Coagulation defect, unspecified: Secondary | ICD-10-CM | POA: Diagnosis not present

## 2015-05-14 DIAGNOSIS — Z7982 Long term (current) use of aspirin: Secondary | ICD-10-CM

## 2015-05-14 DIAGNOSIS — A0472 Enterocolitis due to Clostridium difficile, not specified as recurrent: Secondary | ICD-10-CM | POA: Diagnosis present

## 2015-05-14 DIAGNOSIS — G9341 Metabolic encephalopathy: Secondary | ICD-10-CM | POA: Diagnosis present

## 2015-05-14 DIAGNOSIS — R0602 Shortness of breath: Secondary | ICD-10-CM | POA: Diagnosis not present

## 2015-05-14 DIAGNOSIS — A419 Sepsis, unspecified organism: Secondary | ICD-10-CM | POA: Diagnosis not present

## 2015-05-14 DIAGNOSIS — N390 Urinary tract infection, site not specified: Secondary | ICD-10-CM | POA: Diagnosis not present

## 2015-05-14 DIAGNOSIS — I120 Hypertensive chronic kidney disease with stage 5 chronic kidney disease or end stage renal disease: Secondary | ICD-10-CM | POA: Diagnosis present

## 2015-05-14 DIAGNOSIS — E1122 Type 2 diabetes mellitus with diabetic chronic kidney disease: Secondary | ICD-10-CM | POA: Diagnosis present

## 2015-05-14 DIAGNOSIS — Z8249 Family history of ischemic heart disease and other diseases of the circulatory system: Secondary | ICD-10-CM | POA: Diagnosis not present

## 2015-05-14 DIAGNOSIS — Z992 Dependence on renal dialysis: Secondary | ICD-10-CM | POA: Diagnosis not present

## 2015-05-14 DIAGNOSIS — N2581 Secondary hyperparathyroidism of renal origin: Secondary | ICD-10-CM | POA: Diagnosis present

## 2015-05-14 DIAGNOSIS — N186 End stage renal disease: Secondary | ICD-10-CM | POA: Diagnosis not present

## 2015-05-14 DIAGNOSIS — E119 Type 2 diabetes mellitus without complications: Secondary | ICD-10-CM | POA: Diagnosis not present

## 2015-05-14 DIAGNOSIS — T82818A Embolism of vascular prosthetic devices, implants and grafts, initial encounter: Secondary | ICD-10-CM | POA: Diagnosis not present

## 2015-05-14 DIAGNOSIS — R197 Diarrhea, unspecified: Secondary | ICD-10-CM | POA: Diagnosis not present

## 2015-05-14 DIAGNOSIS — D649 Anemia, unspecified: Secondary | ICD-10-CM | POA: Diagnosis present

## 2015-05-14 DIAGNOSIS — D631 Anemia in chronic kidney disease: Secondary | ICD-10-CM | POA: Diagnosis not present

## 2015-05-14 DIAGNOSIS — I700 Atherosclerosis of aorta: Secondary | ICD-10-CM | POA: Diagnosis not present

## 2015-05-14 DIAGNOSIS — B9561 Methicillin susceptible Staphylococcus aureus infection as the cause of diseases classified elsewhere: Secondary | ICD-10-CM | POA: Diagnosis present

## 2015-05-14 DIAGNOSIS — N393 Stress incontinence (female) (male): Secondary | ICD-10-CM | POA: Diagnosis not present

## 2015-05-14 DIAGNOSIS — A4101 Sepsis due to Methicillin susceptible Staphylococcus aureus: Secondary | ICD-10-CM | POA: Diagnosis present

## 2015-05-14 DIAGNOSIS — E11649 Type 2 diabetes mellitus with hypoglycemia without coma: Secondary | ICD-10-CM | POA: Diagnosis not present

## 2015-05-14 DIAGNOSIS — G9340 Encephalopathy, unspecified: Secondary | ICD-10-CM | POA: Diagnosis present

## 2015-05-14 DIAGNOSIS — D696 Thrombocytopenia, unspecified: Secondary | ICD-10-CM | POA: Diagnosis present

## 2015-05-14 DIAGNOSIS — E118 Type 2 diabetes mellitus with unspecified complications: Secondary | ICD-10-CM | POA: Diagnosis present

## 2015-05-14 DIAGNOSIS — A09 Infectious gastroenteritis and colitis, unspecified: Secondary | ICD-10-CM | POA: Diagnosis not present

## 2015-05-14 HISTORY — DX: Urinary tract infection, site not specified: N39.0

## 2015-05-14 LAB — CBC
HEMATOCRIT: 38.7 % (ref 36.0–46.0)
HEMOGLOBIN: 12.1 g/dL (ref 12.0–15.0)
MCH: 27.8 pg (ref 26.0–34.0)
MCHC: 31.3 g/dL (ref 30.0–36.0)
MCV: 89 fL (ref 78.0–100.0)
PLATELETS: 90 10*3/uL — AB (ref 150–400)
RBC: 4.35 MIL/uL (ref 3.87–5.11)
RDW: 19.2 % — AB (ref 11.5–15.5)
WBC: 4.5 10*3/uL (ref 4.0–10.5)

## 2015-05-14 LAB — BASIC METABOLIC PANEL
Anion gap: 10 (ref 5–15)
BUN: 21 mg/dL — AB (ref 6–20)
CALCIUM: 9.1 mg/dL (ref 8.9–10.3)
CO2: 31 mmol/L (ref 22–32)
CREATININE: 5.02 mg/dL — AB (ref 0.44–1.00)
Chloride: 98 mmol/L — ABNORMAL LOW (ref 101–111)
GFR calc Af Amer: 8 mL/min — ABNORMAL LOW (ref >60)
GFR, EST NON AFRICAN AMERICAN: 7 mL/min — AB (ref >60)
GLUCOSE: 193 mg/dL — AB (ref 65–99)
POTASSIUM: 4.8 mmol/L (ref 3.5–5.1)
SODIUM: 139 mmol/L (ref 135–145)

## 2015-05-14 LAB — URINALYSIS, ROUTINE W REFLEX MICROSCOPIC
Glucose, UA: NEGATIVE mg/dL
Ketones, ur: NEGATIVE mg/dL
NITRITE: NEGATIVE
SPECIFIC GRAVITY, URINE: 1.018 (ref 1.005–1.030)
pH: 8 (ref 5.0–8.0)

## 2015-05-14 LAB — URINE MICROSCOPIC-ADD ON

## 2015-05-14 LAB — I-STAT CG4 LACTIC ACID, ED: LACTIC ACID, VENOUS: 2.46 mmol/L — AB (ref 0.5–2.0)

## 2015-05-14 MED ORDER — SODIUM CHLORIDE 0.9 % IV BOLUS (SEPSIS)
500.0000 mL | Freq: Once | INTRAVENOUS | Status: AC
  Administered 2015-05-14: 500 mL via INTRAVENOUS

## 2015-05-14 MED ORDER — VANCOMYCIN HCL IN DEXTROSE 1-5 GM/200ML-% IV SOLN
1000.0000 mg | Freq: Once | INTRAVENOUS | Status: AC
  Administered 2015-05-15: 1000 mg via INTRAVENOUS
  Filled 2015-05-14: qty 200

## 2015-05-14 MED ORDER — DEXTROSE 5 % IV SOLN
1.0000 g | Freq: Once | INTRAVENOUS | Status: AC
  Administered 2015-05-14: 1 g via INTRAVENOUS
  Filled 2015-05-14: qty 10

## 2015-05-14 NOTE — ED Provider Notes (Signed)
CSN: [id number]     Arrival date & time 05/14/15  2027 History   First MD Initiated Contact with Patient 05/14/15 2107     Chief Complaint  Patient presents with  . Altered Mental Status     (Consider location/radiation/quality/duration/timing/severity/associated sxs/prior Treatment) HPI Comments: 81 year old female with history of diabetes, end-stage renal disease on dialysis, dialysis Tuesday Thursday Saturday, urine infection history presents with mild confusion currently worsening for the past week. Similar to previous urine infection per family member. No fevers. No focal neurologic deficits. No other new meds antibiotics. Symptoms intermittent.  Patient is a 81 y.o. female presenting with altered mental status. The history is provided by the patient and a relative.  Altered Mental Status   Past Medical History  Diagnosis Date  . Cough   . Diabetes mellitus     type 2  . GERD (gastroesophageal reflux disease)   . Hypertension   . Bronchitis   . Osteoarthritis   . Dyslipidemia     remote hx/notes 10/06/2009  . ESRD (end stage renal disease) on dialysis [facility])     Dr Coladonato/Powell  . Depression     lexapro  . Staphylococcus aureus bacteremia with sepsis (HCC)     thought from HD catheter  . Anxiety    Past Surgical History  Procedure Laterality Date  . Eye surgery      Cataract extraction  . Thrombectomy and revision of arterioventous (av) goretex  graft Left 12/2004; 01/2006; 03/12/2009    forearmnotes 10/13/2010; forearmnotes 10/13/2010; upper arm/notes 03/19/2009  . Abdominal hysterectomy      remote hx/notes 10/06/2009  . Vitrectomy Left 03/2001    with membrane peel/notes 10/13/2010  . Arteriovenous graft placement Left 07/2004    forearm/notes 10/13/2010  . Arteriovenous graft placement Left 03/2006    upper armnotes 10/13/2010  . Peripheral vascular catheterization Right 02/18/2015    Procedure: A/V Shuntogram/Fistulagram;  Surgeon: Annice Needy, MD;  Location: [facility]  INVASIVE CV LAB;  Service: Cardiovascular;  Laterality: Right;  . Peripheral vascular catheterization N/A 02/18/2015    Procedure: A/V Shunt Intervention;  Surgeon: Annice Needy, MD;  Location: [facility] INVASIVE CV LAB;  Service: Cardiovascular;  Laterality: N/A;  . Peripheral vascular catheterization N/A 03/06/2015    Procedure: Dialysis/Perma Catheter Insertion;  Surgeon: Annice Needy, MD;  Location: [facility] INVASIVE CV LAB;  Service: Cardiovascular;  Laterality: N/A;  . Revision of arteriovenous goretex graft Right 03/08/2015    Procedure: REVISION OF ARTERIOVENOUS GORETEX GRAFT;  Surgeon: Renford Dills, MD;  Location: [facility] ORS;  Service: Vascular;  Laterality: Right;  . Avgg removal Right 04/10/2015    Procedure: REMOVAL OF RIGHT ARM  ARTERIOVENOUS GORETEX GRAFT (AVGG);  Surgeon: Fransisco Hertz, MD;  Location: [facility] OR;  Service: Vascular;  Laterality: Right;  . Insertion of dialysis catheter Left 04/10/2015    Procedure: INSERTION OF DIALYSIS CATHETER LEFT GROIN;  Surgeon: Fransisco Hertz, MD;  Location: [facility] OR;  Service: Vascular;  Laterality: Left;  . Removal of a dialysis catheter Right 04/10/2015    Procedure: REMOVAL OF RIGHT GROIN DIALYSIS CATHETER;  Surgeon: Fransisco Hertz, MD;  Location[facility] OR;  Service: Vascular;  Laterality: Right;   Family History  Problem Relation Age of Onset  . Hypertension Mother    Social History  Substance Use Topics  . Smoking status: Never Smoker   . Smokeless tobacco: Never Used  . Alcohol Use: No   OB History    No data available  Review of Systems  Unable to perform ROS: Mental status change      Allergies  Review of patient's allergies indicates no known allergies.  Home Medications   Prior to Admission medications   Medication Sig Start Date End Date Taking? Authorizing Provider  acetaminophen (TYLENOL) 500 MG tablet Take 500 mg by mouth 2 (two) times daily as needed for mild pain.    Yes Historical Provider, MD  amLODipine (NORVASC) 5 MG tablet  Take 2.5-5 mg by mouth daily. Take half tablet on Sundays mondays wednesdays and fridays. Whole tablet all other days of the week 04/05/12  Yes Eulis Foster, FNP  aspirin 81 MG tablet Take 81 mg by mouth daily.   Yes Historical Provider, MD  atorvastatin (LIPITOR) 20 MG tablet Take 1 tablet (20 mg total) by mouth daily at 6 PM. 04/14/15  Yes Catarina Hartshorn, MD  carvedilol (COREG) 3.125 MG tablet Take 1 tablet (3.125 mg total) by mouth 2 (two) times daily with a meal. 04/15/15  Yes Catarina Hartshorn, MD  cinacalcet (SENSIPAR) 30 MG tablet Take 30 mg by mouth daily.   Yes Historical Provider, MD  Dorzolamide HCl-Timolol Mal (COSOPT OP) Place 1 drop into both eyes daily.    Yes Historical Provider, MD  escitalopram (LEXAPRO) 10 MG tablet Take 0.5 tablets (5 mg total) by mouth daily. 03/15/15  Yes Eustaquio Boyden, MD  famotidine (PEPCID) 20 MG tablet Take 1 tablet (20 mg total) by mouth 2 (two) times daily. Patient taking differently: Take 20 mg by mouth daily.  11/20/14  Yes Calvert Cantor, MD  folic acid-vitamin b complex-vitamin c-selenium-zinc (DIALYVITE) 3 MG TABS Take 1 tablet by mouth daily.     Yes Historical Provider, MD  multivitamin (RENA-VIT) TABS tablet TAKE ONE TABLET BY MOUTH AT BEDTIME 05/14/15  Yes Eustaquio Boyden, MD  promethazine (PHENERGAN) 25 MG tablet Take 12.5 mg by mouth every 6 (six) hours as needed for nausea or vomiting.   Yes Historical Provider, MD  quiNINE (QUALAQUIN) 324 MG capsule Take 324 mg by mouth 3 (three) times a week. Taking on Tuesday, Thursday, and Saturday per daughter   Yes Historical Provider, MD  sevelamer carbonate (RENVELA) 800 MG tablet Take 2 tablets (1,600 mg total) by mouth 3 (three) times daily with meals. 04/14/15  Yes David Tat, MD   BP 147/62 mmHg  Pulse 72  Temp(Src) 99.4 F (37.4 C) (Rectal)  Resp 18  Ht  (1.626 m)  Wt 130 lb (58.968 kg)  BMI 22.30 kg/m2  SpO2 98% Physical Exam  Constitutional: She appears well-developed and well-nourished.   HENT:  Head: Normocephalic and atraumatic.  Mild dry mucous membranes  Eyes: Right eye exhibits no discharge. Left eye exhibits no discharge.  Neck: Normal range of motion. Neck supple. No tracheal deviation present.  Cardiovascular: Normal rate and regular rhythm.   Pulmonary/Chest: Effort normal and breath sounds normal.  Abdominal: Soft. She exhibits no distension. There is no tenderness. There is no guarding.  Musculoskeletal: She exhibits no edema.  Neurological: She is alert.  General confusion, pleasant, no slurred speech, extraocular muscle function intact, neck supple, equal strength upper lower extremities bilateral.  Skin: Skin is warm. No rash noted.  Psychiatric:  Confusion  Nursing note and vitals reviewed.   ED Course  Procedures (including critical care time) Labs Review Labs Reviewed  URINALYSIS, ROUTINE W REFLEX MICROSCOPIC (NOT AT [facility]) - Abnormal; Notable for the following:    APPearance TURBID (*)    Hgb urine dipstick  LARGE (*)    Bilirubin Urine SMALL (*)    Protein, ur >300 (*)    Leukocytes, UA LARGE (*)    All other components within normal limits  CBC - Abnormal; Notable for the following:    RDW 19.2 (*)    Platelets 90 (*)    All other components within normal limits  BASIC METABOLIC PANEL - Abnormal; Notable for the following:    Chloride 98 (*)    Glucose, Bld 193 (*)    BUN 21 (*)    Creatinine, Ser 5.02 (*)    GFR calc non Af Amer 7 (*)    GFR calc Af Amer 8 (*)    All other components within normal limits  URINE MICROSCOPIC-ADD ON - Abnormal; Notable for the following:    Squamous Epithelial / LPF 0-5 (*)    Bacteria, UA MANY (*)    All other components within normal limits  I-STAT CG4 LACTIC ACID, ED - Abnormal; Notable for the following:    Lactic Acid, Venous 2.46 (*)    All other components within normal limits  URINE CULTURE  CULTURE, BLOOD (ROUTINE X 2)  CULTURE, BLOOD (ROUTINE X 2)    Imaging Review No results found. I  have personally reviewed and evaluated these images and lab results as part of my medical decision-making.   EKG Interpretation None      MDM   Final diagnoses:  Acute encephalopathy  ESRD on dialysis [facility](HCC)  UTI (lower urinary tract infection)   Patient with renal disease history presents with mild confusion, concern for urine infection first metabolic. Plan for blood work, urinalysis urine culture likely IV antibiotics and admission.  Urinalysis reviewed infected. With recent procedure and dialysis history vancomycin added blood cultures added. Admitted to telemetry. The patients results and plan were reviewed and discussed.   Any x-rays performed were independently reviewed by myself.   Differential diagnosis were considered with the presenting HPI.  Medications  cefTRIAXone (ROCEPHIN) 1 g in dextrose 5 % 50 mL IVPB (1 g Intravenous New Bag/Given 05/14/15 2252)  vancomycin (VANCOCIN) IVPB 1000 mg/200 mL premix (not administered)  sodium chloride 0.9 % bolus 500 mL (500 mLs Intravenous New Bag/Given 05/14/15 2252)    Filed Vitals:   05/14/15 2145 05/14/15 2215 05/14/15 2230 05/14/15 2245  BP:  140/65 146/68 147/62  Pulse:  76 75 72  Temp: 99.4 F (37.4 C)     TempSrc: Rectal     Resp:      Height:      Weight:      SpO2:  94% 96% 98%    Final diagnoses:  Acute encephalopathy  ESRD on dialysis [facility](HCC)  UTI (lower urinary tract infection)    Admission/ observation were discussed with the admitting physician, patient and/or family and they are comfortable with the plan.     Blane OharaJoshua Carmelia Tiner, MD 05/14/15 [phone]

## 2015-05-14 NOTE — H&P (Addendum)
[facility] History and Physical  [name] NWG:[id number] DOB: [birth year] DOA: 05/14/2015  Referring physician: Dr. Jodi Mourning. PCP: Eustaquio Boyden, MD  Specialists: Nephrology for dialysis.  Chief Complaint: Confusion.  History obtained from patient's daughter.  HPI: [name] is a 81 y.o. female with history of ESRD on hemodialysis on Tuesday Thursdays and Saturday, hypertension hyperlipidemia and diabetes mellitus was brought to the ER after patient was found to be increasingly confused over the last 3 days. In addition patient also was noticed to have increasing diarrhea over the last couple of days. Did not have any nausea vomiting chest pain or shortness of breath. In the ER UA shows which is concerning for UTI and lactic as was mildly elevated. Patient also was mildly febrile. Patient was admitted last month for sepsis from right AV graft infection. Other than patient also had cardiac arrest. Patient at this time is only oriented to her name but follows commands and moves all extremities. Patient had been admitted for acute encephalopathy most likely from infectious cause UTI. Patient also has diarrhea.   Review of Systems: As presented in the history of presenting illness, rest negative.  Past Medical History  Diagnosis Date  . Cough   . Diabetes mellitus     type 2  . GERD (gastroesophageal reflux disease)   . Hypertension   . Bronchitis   . Osteoarthritis   . Dyslipidemia     remote hx/notes 10/06/2009  . ESRD (end stage renal disease) on dialysis [facility])     Dr Coladonato/Powell  . Depression     lexapro  . Staphylococcus aureus bacteremia with sepsis (HCC)     thought from HD catheter  . Anxiety    Past Surgical History  Procedure Laterality Date  . Eye surgery      Cataract extraction  . Thrombectomy and revision of arterioventous (av) goretex  graft Left 12/2004; 01/2006; 03/12/2009    forearmnotes 10/13/2010; forearmnotes 10/13/2010; upper arm/notes  03/19/2009  . Abdominal hysterectomy      remote hx/notes 10/06/2009  . Vitrectomy Left 03/2001    with membrane peel/notes 10/13/2010  . Arteriovenous graft placement Left 07/2004    forearm/notes 10/13/2010  . Arteriovenous graft placement Left 03/2006    upper armnotes 10/13/2010  . Peripheral vascular catheterization Right 02/18/2015    Procedure: A/V Shuntogram/Fistulagram;  Surgeon: Annice Needy, MD;  Location: [facility] INVASIVE CV LAB;  Service: Cardiovascular;  Laterality: Right;  . Peripheral vascular catheterization N/A 02/18/2015    Procedure: A/V Shunt Intervention;  Surgeon: Annice Needy, MD;  Location: [facility] INVASIVE CV LAB;  Service: Cardiovascular;  Laterality: N/A;  . Peripheral vascular catheterization N/A 03/06/2015    Procedure: Dialysis/Perma Catheter Insertion;  Surgeon: Annice Needy, MD;  Location: [facility] INVASIVE CV LAB;  Service: Cardiovascular;  Laterality: N/A;  . Revision of arteriovenous goretex graft Right 03/08/2015    Procedure: REVISION OF ARTERIOVENOUS GORETEX GRAFT;  Surgeon: Renford Dills, MD;  Location: [facility] ORS;  Service: Vascular;  Laterality: Right;  . Avgg removal Right 04/10/2015    Procedure: REMOVAL OF RIGHT ARM  ARTERIOVENOUS GORETEX GRAFT (AVGG);  Surgeon: Fransisco Hertz, MD;  Location: [facility] OR;  Service: Vascular;  Laterality: Right;  . Insertion of dialysis catheter Left 04/10/2015    Procedure: INSERTION OF DIALYSIS CATHETER LEFT GROIN;  Surgeon: Fransisco Hertz, MD;  Location: [facility] OR;  Service: Vascular;  Laterality: Left;  . Removal of a dialysis catheter Right 04/10/2015    Procedure: REMOVAL  OF RIGHT GROIN DIALYSIS CATHETER;  Surgeon: Fransisco Hertz, MD;  Location: [facility] OR;  Service: Vascular;  Laterality: Right;   Social History:  reports that she has never smoked. She has never used smokeless tobacco. She reports that she does not drink alcohol or use illicit drugs. Where does patient live home. Can patient participate in ADLs? Not sure.  No Known  Allergies  Family History:  Family History  Problem Relation Age of Onset  . Hypertension Mother       Prior to Admission medications   Medication Sig Start Date End Date Taking? Authorizing Provider  acetaminophen (TYLENOL) 500 MG tablet Take 500 mg by mouth 2 (two) times daily as needed for mild pain.    Yes Historical Provider, MD  amLODipine (NORVASC) 5 MG tablet Take 2.5-5 mg by mouth daily. Take half tablet on Sundays mondays wednesdays and fridays. Whole tablet all other days of the week 04/05/12  Yes Eulis Foster, FNP  aspirin 81 MG tablet Take 81 mg by mouth daily.   Yes Historical Provider, MD  atorvastatin (LIPITOR) 20 MG tablet Take 1 tablet (20 mg total) by mouth daily at 6 PM. 04/14/15  Yes Catarina Hartshorn, MD  carvedilol (COREG) 3.125 MG tablet Take 1 tablet (3.125 mg total) by mouth 2 (two) times daily with a meal. 04/15/15  Yes Catarina Hartshorn, MD  cinacalcet (SENSIPAR) 30 MG tablet Take 30 mg by mouth daily.   Yes Historical Provider, MD  Dorzolamide HCl-Timolol Mal (COSOPT OP) Place 1 drop into both eyes daily.    Yes Historical Provider, MD  escitalopram (LEXAPRO) 10 MG tablet Take 0.5 tablets (5 mg total) by mouth daily. 03/15/15  Yes Eustaquio Boyden, MD  famotidine (PEPCID) 20 MG tablet Take 1 tablet (20 mg total) by mouth 2 (two) times daily. Patient taking differently: Take 20 mg by mouth daily.  11/20/14  Yes Calvert Cantor, MD  folic acid-vitamin b complex-vitamin c-selenium-zinc (DIALYVITE) 3 MG TABS Take 1 tablet by mouth daily.     Yes Historical Provider, MD  multivitamin (RENA-VIT) TABS tablet TAKE ONE TABLET BY MOUTH AT BEDTIME 05/14/15  Yes Eustaquio Boyden, MD  promethazine (PHENERGAN) 25 MG tablet Take 12.5 mg by mouth every 6 (six) hours as needed for nausea or vomiting.   Yes Historical Provider, MD  quiNINE (QUALAQUIN) 324 MG capsule Take 324 mg by mouth 3 (three) times a week. Taking on Tuesday, Thursday, and Saturday per daughter   Yes Historical Provider, MD   sevelamer carbonate (RENVELA) 800 MG tablet Take 2 tablets (1,600 mg total) by mouth 3 (three) times daily with meals. 04/14/15  Yes Catarina Hartshorn, MD    Physical Exam: Filed Vitals:   05/14/15 2145 05/14/15 2215 05/14/15 2230 05/14/15 2245  BP:  140/65 146/68 147/62  Pulse:  76 75 72  Temp: 99.4 F (37.4 C)     TempSrc: Rectal     Resp:      Height:      Weight:      SpO2:  94% 96% 98%     General:  Moderately built and poorly nourished.  Eyes: Anicteric no pallor.  ENT: No discharge from the ears eyes nose and mouth.  Neck: No mass felt.  Cardiovascular: S1-S2 heard.  Respiratory: No rhonchi or crepitations.  Abdomen: Soft nontender bowel sounds present.  Skin: Right AV graft area has a dressing which looks clean.  Musculoskeletal: No edema.  Psychiatric: Confused.  Neurologic: Alert awake oriented to her name only.  Moves all extremities.  Labs on Admission:  Basic Metabolic Panel:  Recent Labs Lab 05/14/15 2131  NA 139  K 4.8  CL 98*  CO2 31  GLUCOSE 193*  BUN 21*  CREATININE 5.02*  CALCIUM 9.1   Liver Function Tests: No results for input(s): AST, ALT, ALKPHOS, BILITOT, PROT, ALBUMIN in the last 168 hours. No results for input(s): LIPASE, AMYLASE in the last 168 hours. No results for input(s): AMMONIA in the last 168 hours. CBC:  Recent Labs Lab 05/14/15 2131  WBC 4.5  HGB 12.1  HCT 38.7  MCV 89.0  PLT 90*   Cardiac Enzymes: No results for input(s): CKTOTAL, CKMB, CKMBINDEX, TROPONINI in the last 168 hours.  BNP (last 3 results) No results for input(s): BNP in the last 8760 hours.  ProBNP (last 3 results) No results for input(s): PROBNP in the last 8760 hours.  CBG: No results for input(s): GLUCAP in the last 168 hours.  Radiological Exams on Admission: No results found.    Assessment/Plan Principal Problem:   Acute encephalopathy Active Problems:   Diarrhea   UTI (lower urinary tract infection)   Sepsis (HCC)   ESRD on  dialysis (HCC)   1. Acute encephalopathy probably from infectious causes and possible developing sepsis - possibly UTIs could be the source. Patient also has diarrhea. At this time blood cultures urine cultures and GI pathogen panel has been ordered and patient has been placed on broad-spectrum antibiotics until cultures are available. Follow lactic acid levels. Procalcitonin levels. Check ammonia levels. I'm holding off patient's Quinine for now. 2. Diarrhea - check GI pathogen panel. 3. ESRD on hemodialysis on Tuesday Thursdays and Saturday - as per patient's daughter patient did have dialysis as scheduled yesterday. Consult nephrology for dialysis. 4. Hypertension - we'll continue home medications. 5. Hyperlipidemia on statins. 6. Diabetes mellitus type 2 on diet and sliding scale coverage has been placed. 7. Chronic thrombocytopenia - follow CBC. 8. Recently admitted for sepsis from AV graft source.  I have reviewed patient's old charts and labs. Personally reviewed patient's chest x-ray.  Addendum - early in the morning around 5 AM patient became agitated for which patient was given 0.25 mg of IV Ativan 1 dose. Will check CT head.  DVT Prophylaxis Lovenox.  Code Status: Full code.  Family Communication: Patient's daughter.  Disposition Plan: Admit to inpatient.    KAKRAKANDY,ARSHAD N. [facility] Pager [phone].  If 7PM-7AM, please contact night-coverage [email or web address] Password [email or web address] 05/14/2015, 11:44 PM

## 2015-05-14 NOTE — ED Notes (Signed)
Pt receives dialysis on Tu/Thur/Sat; right arm is restricted. Last dialyzed today.

## 2015-05-14 NOTE — ED Notes (Signed)
Pt brought in tonight by daughter for confusion and she thinks the patient has a UTI due to diarrhea and the confusion, onset one week ago. Pt is alert to self and place but disoriented to age and time and situation.

## 2015-05-15 ENCOUNTER — Inpatient Hospital Stay (HOSPITAL_COMMUNITY): Payer: Medicare Other

## 2015-05-15 ENCOUNTER — Encounter (HOSPITAL_COMMUNITY): Payer: Self-pay | Admitting: General Practice

## 2015-05-15 DIAGNOSIS — N390 Urinary tract infection, site not specified: Principal | ICD-10-CM

## 2015-05-15 DIAGNOSIS — N186 End stage renal disease: Secondary | ICD-10-CM

## 2015-05-15 DIAGNOSIS — G9340 Encephalopathy, unspecified: Secondary | ICD-10-CM

## 2015-05-15 DIAGNOSIS — Z992 Dependence on renal dialysis: Secondary | ICD-10-CM

## 2015-05-15 DIAGNOSIS — A09 Infectious gastroenteritis and colitis, unspecified: Secondary | ICD-10-CM

## 2015-05-15 LAB — I-STAT CG4 LACTIC ACID, ED: Lactic Acid, Venous: 1.27 mmol/L (ref 0.5–2.0)

## 2015-05-15 LAB — CREATININE, SERUM
Creatinine, Ser: 5.65 mg/dL — ABNORMAL HIGH (ref 0.44–1.00)
GFR calc Af Amer: 7 mL/min — ABNORMAL LOW (ref >60)
GFR, EST NON AFRICAN AMERICAN: 6 mL/min — AB (ref >60)

## 2015-05-15 LAB — PROTIME-INR
INR: 1.21 (ref 0.00–1.49)
PROTHROMBIN TIME: 15.4 s — AB (ref 11.6–15.2)

## 2015-05-15 LAB — COMPREHENSIVE METABOLIC PANEL
ALBUMIN: 3.3 g/dL — AB (ref 3.5–5.0)
ALT: 13 U/L — ABNORMAL LOW (ref 14–54)
ANION GAP: 11 (ref 5–15)
AST: 27 U/L (ref 15–41)
Alkaline Phosphatase: 79 U/L (ref 38–126)
BUN: 22 mg/dL — AB (ref 6–20)
CHLORIDE: 99 mmol/L — AB (ref 101–111)
CO2: 28 mmol/L (ref 22–32)
Calcium: 8.8 mg/dL — ABNORMAL LOW (ref 8.9–10.3)
Creatinine, Ser: 5.51 mg/dL — ABNORMAL HIGH (ref 0.44–1.00)
GFR calc Af Amer: 7 mL/min — ABNORMAL LOW (ref >60)
GFR, EST NON AFRICAN AMERICAN: 6 mL/min — AB (ref >60)
GLUCOSE: 90 mg/dL (ref 65–99)
POTASSIUM: 5 mmol/L (ref 3.5–5.1)
Sodium: 138 mmol/L (ref 135–145)
Total Bilirubin: 0.9 mg/dL (ref 0.3–1.2)
Total Protein: 7.4 g/dL (ref 6.5–8.1)

## 2015-05-15 LAB — GLUCOSE, CAPILLARY
GLUCOSE-CAPILLARY: 99 mg/dL (ref 65–99)
GLUCOSE-CAPILLARY: 83 mg/dL (ref 65–99)
Glucose-Capillary: 81 mg/dL (ref 65–99)
Glucose-Capillary: 129 mg/dL — ABNORMAL HIGH (ref 65–99)

## 2015-05-15 LAB — APTT: aPTT: 33 seconds (ref 24–37)

## 2015-05-15 LAB — CBC WITH DIFFERENTIAL/PLATELET
BASOS ABS: 0 10*3/uL (ref 0.0–0.1)
BASOS PCT: 1 %
EOS PCT: 3 %
Eosinophils Absolute: 0.2 10*3/uL (ref 0.0–0.7)
HCT: 38.7 % (ref 36.0–46.0)
Hemoglobin: 12.4 g/dL (ref 12.0–15.0)
Lymphocytes Relative: 31 %
Lymphs Abs: 1.5 10*3/uL (ref 0.7–4.0)
MCH: 28.2 pg (ref 26.0–34.0)
MCHC: 32 g/dL (ref 30.0–36.0)
MCV: 88 fL (ref 78.0–100.0)
MONO ABS: 0.7 10*3/uL (ref 0.1–1.0)
MONOS PCT: 14 %
Neutro Abs: 2.4 10*3/uL (ref 1.7–7.7)
Neutrophils Relative %: 51 %
PLATELETS: 80 10*3/uL — AB (ref 150–400)
RBC: 4.4 MIL/uL (ref 3.87–5.11)
RDW: 19.1 % — AB (ref 11.5–15.5)
WBC: 4.6 10*3/uL (ref 4.0–10.5)

## 2015-05-15 LAB — AMMONIA: Ammonia: 26 umol/L (ref 9–35)

## 2015-05-15 LAB — PROCALCITONIN: PROCALCITONIN: 0.3 ng/mL

## 2015-05-15 LAB — CBG MONITORING, ED: GLUCOSE-CAPILLARY: 90 mg/dL (ref 65–99)

## 2015-05-15 LAB — LACTIC ACID, PLASMA
LACTIC ACID, VENOUS: 1.6 mmol/L (ref 0.5–2.0)
Lactic Acid, Venous: 1.6 mmol/L (ref 0.5–2.0)

## 2015-05-15 MED ORDER — SODIUM CHLORIDE 0.9 % IV SOLN
500.0000 mg | INTRAVENOUS | Status: DC
  Administered 2015-05-16: 500 mg via INTRAVENOUS
  Filled 2015-05-15 (×3): qty 500

## 2015-05-15 MED ORDER — RENA-VITE PO TABS
1.0000 | ORAL_TABLET | Freq: Every day | ORAL | Status: DC
  Administered 2015-05-15 – 2015-05-18 (×4): 1 via ORAL
  Filled 2015-05-15 (×8): qty 1

## 2015-05-15 MED ORDER — ACETAMINOPHEN 325 MG PO TABS
650.0000 mg | ORAL_TABLET | Freq: Four times a day (QID) | ORAL | Status: DC | PRN
  Filled 2015-05-15: qty 2

## 2015-05-15 MED ORDER — ONDANSETRON HCL 4 MG PO TABS: 4.0000 mg | ORAL_TABLET | Freq: Four times a day (QID) | ORAL | Status: DC | PRN

## 2015-05-15 MED ORDER — AMLODIPINE BESYLATE 5 MG PO TABS: 5.0000 mg | ORAL_TABLET | Freq: Every day | ORAL | Status: DC

## 2015-05-15 MED ORDER — INSULIN ASPART 100 UNIT/ML ~~LOC~~ SOLN
0.0000 [IU] | Freq: Three times a day (TID) | SUBCUTANEOUS | Status: DC
  Administered 2015-05-17 – 2015-05-18 (×2): 1 [IU] via SUBCUTANEOUS

## 2015-05-15 MED ORDER — ACETAMINOPHEN 650 MG RE SUPP: 650.0000 mg | Freq: Four times a day (QID) | RECTAL | Status: DC | PRN

## 2015-05-15 MED ORDER — PIPERACILLIN-TAZOBACTAM IN DEX 2-0.25 GM/50ML IV SOLN
2.2500 g | Freq: Three times a day (TID) | INTRAVENOUS | Status: DC
  Administered 2015-05-15 – 2015-05-19 (×13): 2.25 g via INTRAVENOUS
  Filled 2015-05-15 (×18): qty 50

## 2015-05-15 MED ORDER — AMLODIPINE BESYLATE 5 MG PO TABS: 2.5000 mg | ORAL_TABLET | Freq: Every day | ORAL | Status: DC

## 2015-05-15 MED ORDER — PROMETHAZINE HCL 25 MG PO TABS
12.5000 mg | ORAL_TABLET | Freq: Four times a day (QID) | ORAL | Status: DC | PRN
  Filled 2015-05-15: qty 1

## 2015-05-15 MED ORDER — FAMOTIDINE 20 MG PO TABS
20.0000 mg | ORAL_TABLET | Freq: Every day | ORAL | Status: DC
  Administered 2015-05-15 – 2015-05-18 (×3): 20 mg via ORAL
  Filled 2015-05-15 (×3): qty 1

## 2015-05-15 MED ORDER — SODIUM CHLORIDE 0.9 % IJ SOLN
3.0000 mL | Freq: Two times a day (BID) | INTRAMUSCULAR | Status: DC
  Administered 2015-05-15 – 2015-05-19 (×10): 3 mL via INTRAVENOUS

## 2015-05-15 MED ORDER — RENA-VITE PO TABS
1.0000 | ORAL_TABLET | Freq: Every day | ORAL | Status: DC
  Filled 2015-05-15: qty 1

## 2015-05-15 MED ORDER — ASPIRIN EC 81 MG PO TBEC
81.0000 mg | DELAYED_RELEASE_TABLET | Freq: Every day | ORAL | Status: DC
  Administered 2015-05-15 – 2015-05-19 (×4): 81 mg via ORAL
  Filled 2015-05-15 (×4): qty 1

## 2015-05-15 MED ORDER — ATORVASTATIN CALCIUM 20 MG PO TABS
20.0000 mg | ORAL_TABLET | Freq: Every day | ORAL | Status: DC
  Administered 2015-05-15: 20 mg via ORAL
  Filled 2015-05-15 (×2): qty 1

## 2015-05-15 MED ORDER — NEPRO/CARBSTEADY PO LIQD
237.0000 mL | Freq: Two times a day (BID) | ORAL | Status: DC
  Administered 2015-05-15 – 2015-05-19 (×5): 237 mL via ORAL

## 2015-05-15 MED ORDER — AMLODIPINE BESYLATE 5 MG PO TABS
5.0000 mg | ORAL_TABLET | Freq: Every day | ORAL | Status: DC
  Administered 2015-05-16 – 2015-05-18 (×3): 5 mg via ORAL
  Filled 2015-05-15 (×3): qty 1

## 2015-05-15 MED ORDER — LORAZEPAM 2 MG/ML IJ SOLN
0.2500 mg | Freq: Once | INTRAMUSCULAR | Status: AC
  Administered 2015-05-15: 0.25 mg via INTRAVENOUS
  Filled 2015-05-15: qty 1

## 2015-05-15 MED ORDER — CARVEDILOL 3.125 MG PO TABS
3.1250 mg | ORAL_TABLET | Freq: Two times a day (BID) | ORAL | Status: DC
  Administered 2015-05-15 – 2015-05-19 (×6): 3.125 mg via ORAL
  Filled 2015-05-15 (×8): qty 1

## 2015-05-15 MED ORDER — ONDANSETRON HCL 4 MG/2ML IJ SOLN: 4.0000 mg | Freq: Four times a day (QID) | INTRAMUSCULAR | Status: DC | PRN

## 2015-05-15 MED ORDER — ENOXAPARIN SODIUM 30 MG/0.3ML ~~LOC~~ SOLN
30.0000 mg | Freq: Every day | SUBCUTANEOUS | Status: DC
  Administered 2015-05-15 – 2015-05-19 (×4): 30 mg via SUBCUTANEOUS
  Filled 2015-05-15 (×6): qty 0.3

## 2015-05-15 MED ORDER — CINACALCET HCL 30 MG PO TABS
30.0000 mg | ORAL_TABLET | Freq: Every day | ORAL | Status: DC
  Administered 2015-05-15 – 2015-05-19 (×4): 30 mg via ORAL
  Filled 2015-05-15 (×5): qty 1

## 2015-05-15 MED ORDER — DOXERCALCIFEROL 4 MCG/2ML IV SOLN
2.0000 ug | INTRAVENOUS | Status: DC
  Administered 2015-05-16: 2 ug via INTRAVENOUS
  Filled 2015-05-15 (×2): qty 2

## 2015-05-15 MED ORDER — ESCITALOPRAM OXALATE 10 MG PO TABS
5.0000 mg | ORAL_TABLET | Freq: Every day | ORAL | Status: DC
  Administered 2015-05-15: 5 mg via ORAL
  Filled 2015-05-15: qty 1

## 2015-05-15 MED ORDER — SEVELAMER CARBONATE 800 MG PO TABS
1600.0000 mg | ORAL_TABLET | Freq: Three times a day (TID) | ORAL | Status: DC
  Administered 2015-05-15 – 2015-05-19 (×7): 1600 mg via ORAL
  Filled 2015-05-15 (×11): qty 2

## 2015-05-15 NOTE — Progress Notes (Signed)
Patient arrived on unit via stretcher from ED.  No family at bedside.  Telemetry placed per MD order & CMT notified. 

## 2015-05-15 NOTE — Progress Notes (Signed)
ANTIBIOTIC CONSULT NOTE - INITIAL  Pharmacy Consult for Vancomycin and Zosyn  Indication: rule out sepsis  No Known Allergies  Patient Measurements: Height: 5\' 4"  (162.6 cm) Weight: 130 lb (58.968 kg) IBW/kg (Calculated) : 54.7  Vital Signs: Temp: 98.9 F (37.2 C) (12/14 0006) Temp Source: Oral (12/14 0006) BP: 141/60 mmHg (12/14 0200) Pulse Rate: 72 (12/14 0200) Intake/Output from previous day:   Intake/Output from this shift:    Labs:  Recent Labs  05/14/15 2131  WBC 4.5  HGB 12.1  PLT 90*  CREATININE 5.02*   Estimated Creatinine Clearance: 7.2 mL/min (by C-G formula based on Cr of 5.02). No results for input(s): VANCOTROUGH, VANCOPEAK, VANCORANDOM, GENTTROUGH, GENTPEAK, GENTRANDOM, TOBRATROUGH, TOBRAPEAK, TOBRARND, AMIKACINPEAK, AMIKACINTROU, AMIKACIN in the last 72 hours.   Microbiology: No results found for this or any previous visit (from the past 720 hour(s)).  Medical History: Past Medical History  Diagnosis Date  . Cough   . Diabetes mellitus     type 2  . GERD (gastroesophageal reflux disease)   . Hypertension   . Bronchitis   . Osteoarthritis   . Dyslipidemia     remote hx/notes 10/06/2009  . ESRD (end stage renal disease) on dialysis [facility](HCC)     Dr Coladonato/Powell  . Depression     lexapro  . Staphylococcus aureus bacteremia with sepsis (HCC)     thought from HD catheter  . Anxiety     Medications:  Lipitor  Coreg  Lexapro  Pepcid  Renavite  Renvela  APAP  Norvasc  ASA  Sensipar  Cosopt    Assessment: 81 y.o. female with AMS, possible urosepsis, for empiric antibiotics  Goal of Therapy:  Vancomycin pre-HD level 15-25  Plan:  Vancomycin 1 g IV as given in ED, then 500 mg IV qHD Zosyn 2.25 g IV q8h  Eddie Candlebbott, Rockney Grenz [place] 05/15/2015,4:12 AM

## 2015-05-15 NOTE — ED Notes (Signed)
Gwynn, pt's daughter, called for update. Daughter updated on waiting for admission

## 2015-05-15 NOTE — Progress Notes (Signed)
[facility] PROGRESS NOTE  [name] JYN:[id number] DOB: June 09, 1928 DOA: 05/14/2015 PCP: Michelle BoydenJavier Gutierrez, MD  Narrative 81 year old female with end-stage renal disease on hemodialysis (CTS) type 2 diabetes mellitus, GERD, hypertension, with multiple UTIs or to the ED after found to be increasingly confused for the past 3 days she also had increasing diarrhea for the past 2 days. No history of nausea, vomiting, shortness of breath, fevers, chills , chest pain or abdominal pain. In the ED UA was suggestive of UTI. She also had mildly elevated lactic acid low-grade temperature. She was admitted one month back for sepsis from right AV graft infection. Admitted to hospitalist service for further management of acute encephalopathy.  Assessment/Plan: Acute encephalopathy Possibly secondary to underlying UTI. Also having diarrhea. Follow blood cultures, urine cultures and GI pathogen panel. On empiric vancomycin and Zosyn. Subsequent lactic acid normalized. Ammonia level and pro calcitonin normal. Head CT and chest x-ray unremarkable. -Avoid benzos or narcotics. Quinine held.   Diarrhea Follow GI pathogen panel. Enteric precautions.  ESRD on hemodialysis (TTS) Patient missed her last dialysis as per daughter. Renal consulted. Plan on hemodialysis tomorrow.conitue hectoral, renvela and sesipar.  Essential hypertension Resume Norvasc and Coreg. Has low blood pressure following hemodialysis which needs to be monitored. Continue pectoral, Renvela and Sensipar.  Type 2 diabetes mellitus Continue sliding scale coverage.  Chronic thrombocytopenia Stable. Monitor.  DVT prophylaxis Diet: Renal  Code Status: Full code Family Communication: None at bedside Disposition Plan: Continue inpatient monitoring.   Consultants:  Renal  Procedures:  Head CT  Antibiotics:  IV vancomycin and Zosyn  HPI/Subjective: Seen and examined. Admission H&P reviewed. Remains  confused.  Objective: Filed Vitals:   05/15/15 0823 05/15/15 0934  BP:  187/70  Pulse: 79 70  Temp:  98.6 F (37 C)  Resp:  16    Intake/Output Summary (Last 24 hours) at 05/15/15 1549 Last data filed at 05/15/15 1409  Gross per 24 hour  Intake    240 ml  Output      0 ml  Net    240 ml   Filed Weights   05/14/15 2045 05/15/15 0934  Weight: 58.968 kg (130 lb) 59.8 kg (131 lb 13.4 oz)    Exam:   General:  Live female lying in bed not in distress, confused  HEENT: No pallor, moist mucosa  Chest: Clear to auscultation bilaterally   CVS: Normal S1 and S2, no murmurs  GI: Soft, nondistended, nontender, bowel sounds present  Musculoskeletal: Warm, no edema, left groin HD catheter  CNS: Alert and with, confused and not oriented    Data Reviewed: Basic Metabolic Panel:  Recent Labs Lab 05/14/15 2131 05/15/15 0550 05/15/15 0551  NA 139 138  --   K 4.8 5.0  --   CL 98* 99*  --   CO2 31 28  --   GLUCOSE 193* 90  --   BUN 21* 22*  --   CREATININE 5.02* 5.51* 5.65*  CALCIUM 9.1 8.8*  --    Liver Function Tests:  Recent Labs Lab 05/15/15 0550  AST 27  ALT 13*  ALKPHOS 79  BILITOT 0.9  PROT 7.4  ALBUMIN 3.3*   No results for input(s): LIPASE, AMYLASE in the last 168 hours.  Recent Labs Lab 05/15/15 0735  AMMONIA 26   CBC:  Recent Labs Lab 05/14/15 2131 05/15/15 0550  WBC 4.5 4.6  NEUTROABS  --  2.4  HGB 12.1 12.4  HCT 38.7 38.7  MCV 89.0 88.0  PLT 90* 80*   Cardiac Enzymes: No results for input(s): CKTOTAL, CKMB, CKMBINDEX, TROPONINI in the last 168 hours. BNP (last 3 results) No results for input(s): BNP in the last 8760 hours.  ProBNP (last 3 results) No results for input(s): PROBNP in the last 8760 hours.  CBG:  Recent Labs Lab 05/15/15 0755 05/15/15 0927 05/15/15 1137  GLUCAP 90 99 83    Recent Results (from the past 240 hour(s))  Blood culture (routine x 2)     Status: None (Preliminary result)   Collection Time:  05/14/15 11:10 PM  Result Value Ref Range Status   Specimen Description BLOOD RIGHT ARM  Final   Special Requests BOTTLES DRAWN AEROBIC AND ANAEROBIC  Final   Culture NO GROWTH < 12 HOURS  Final   Report Status PENDING  Incomplete  Blood culture (routine x 2)     Status: None (Preliminary result)   Collection Time: 05/14/15 11:17 PM  Result Value Ref Range Status   Specimen Description BLOOD RIGHT HAND  Final   Special Requests BOTTLES DRAWN AEROBIC AND ANAEROBIC  Final   Culture NO GROWTH < 12 HOURS  Final   Report Status PENDING  Incomplete     Studies: Ct Head Wo Contrast  05/15/2015  CLINICAL DATA:  Acute encephalopathy. EXAM: CT HEAD WITHOUT CONTRAST TECHNIQUE: Contiguous axial images were obtained from the base of the skull through the vertex without intravenous contrast. COMPARISON:  CT scan of April 08, 2015. FINDINGS: Bony calvarium appears intact. Moderate diffuse cortical atrophy is noted. Mild chronic ischemic white matter disease is noted. No mass effect or midline shift is noted. Ventricular size is within normal limits. There is no evidence of mass lesion, hemorrhage or acute infarction. IMPRESSION: Moderate diffuse cortical atrophy. Mild chronic ischemic white matter disease. No acute intracranial abnormality seen. Electronically Signed   By: Lupita Raider, M.D.   On: 05/15/2015 11:25   Dg Chest Port 1 View  05/15/2015  CLINICAL DATA:  Acute onset of shortness of breath. Sepsis. Initial encounter. EXAM: PORTABLE CHEST 1 VIEW COMPARISON:  Chest radiograph from 04/10/2015 FINDINGS: The lungs are well-aerated. Pulmonary vascularity is at the upper limits of normal. There is no evidence of focal opacification, pleural effusion or pneumothorax. The cardiomediastinal silhouette is mildly enlarged. A right-sided vascular stent is noted. No acute osseous abnormalities are seen. Scattered clips are noted about the left axilla. IMPRESSION: Mild cardiomegaly.  Lungs remain  grossly clear. Electronically Signed   By: Roanna Raider M.D.   On: 05/15/2015 05:11    Scheduled Meds: . [START ON 05/16/2015] amLODipine  5 mg Oral QHS  . aspirin EC  81 mg Oral Daily  . atorvastatin  20 mg Oral q1800  . carvedilol  3.125 mg Oral BID WC  . cinacalcet  30 mg Oral Q breakfast  . [START ON 05/16/2015] doxercalciferol  2 mcg Intravenous Q T,Th,Sa-HD  . enoxaparin (LOVENOX) injection  30 mg Subcutaneous Daily  . escitalopram  5 mg Oral Daily  . famotidine  20 mg Oral Daily  . feeding supplement (NEPRO CARB STEADY)  237 mL Oral BID BM  . insulin aspart  0-9 Units Subcutaneous TID WC  . multivitamin  1 tablet Oral QHS  . piperacillin-tazobactam (ZOSYN)  IV  2.25 g Intravenous 3 times per day  . sevelamer carbonate  1,600 mg Oral TID WC  . sodium chloride  3 mL Intravenous Q12H  . [START ON 05/16/2015] vancomycin  500 mg Intravenous Q  T,Th,Sa-HD   Continuous Infusions:    Time spent: 25 minutes    Manuelito Poage  [facility] Pager [phone]. If 7PM-7AM, please contact night-coverage at [email or web address], password [facility] 05/15/2015, 3:49 PM  LOS: 1 day
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
## 2015-05-15 NOTE — Consult Note (Signed)
[facility] Renal Consultation Note    Indication for Consultation:  Management of ESRD/hemodialysis; anemia, hypertension/volume and secondary hyperparathyroidism PCP: Eustaquio Boyden, MD   HPI: [name] is a 81 y.o. female with ESRD secondary to DM/HTN on HD since March 2006 who dialyzes TTS at [facility] with a history of multiple UTIS, multiple recent access related infections. She had MSSA 08/2014 and 10/2014. More recently she had + cultures from arm AVGG 9/27 proteus mirabilis and 10/20 enterobacter cloace treated with Elita Quick x 2 weeks - the last dose was 11/5 . She has been followed by [facility] previously. During her November admission she had an old right upper AVGG segment removed and d/c on cipro. Wound cultures were + for [facility] She also had a questionable UTI but there was no urine culture. She was seen by Dr. Imogene Burn 11/30 for follow up and recommended continued wet to dry dressing with return to [facility] once all healed.  Pt was seen at dialysis and dialyzed without sequealae 12/13 without increase in temp.  She attained EDW with UF 1.8 and post weight 148/73.  She was brought to the ED afterwards with report of increased confusion and associated diarrhea, but no N, V, SOB, CP.  UA was suggestive of UTI.  She was admitted for work up and treatment of acute encephalopathy.  Past Medical History  Diagnosis Date  . Cough   . Diabetes mellitus     type 2  . GERD (gastroesophageal reflux disease)   . Hypertension   . Bronchitis   . Osteoarthritis   . Dyslipidemia     remote hx/notes 10/06/2009  . ESRD (end stage renal disease) on dialysis [facility])     Dr Coladonato/Powell  . Depression     lexapro  . Staphylococcus aureus bacteremia with sepsis (HCC)     thought from HD catheter  . Anxiety    Past Surgical History  Procedure Laterality Date  . Eye surgery      Cataract extraction  . Thrombectomy and revision of arterioventous (av) goretex  graft Left 12/2004; 01/2006;  03/12/2009    forearmnotes 10/13/2010; forearmnotes 10/13/2010; upper arm/notes 03/19/2009  . Abdominal hysterectomy      remote hx/notes 10/06/2009  . Vitrectomy Left 03/2001    with membrane peel/notes 10/13/2010  . Arteriovenous graft placement Left 07/2004    forearm/notes 10/13/2010  . Arteriovenous graft placement Left 03/2006    upper armnotes 10/13/2010  . Peripheral vascular catheterization Right 02/18/2015    Procedure: A/V Shuntogram/Fistulagram;  Surgeon: Annice Needy, MD;  Location: [facility] INVASIVE CV LAB;  Service: Cardiovascular;  Laterality: Right;  . Peripheral vascular catheterization N/A 02/18/2015    Procedure: A/V Shunt Intervention;  Surgeon: Annice Needy, MD;  Location: [facility] INVASIVE CV LAB;  Service: Cardiovascular;  Laterality: N/A;  . Peripheral vascular catheterization N/A 03/06/2015    Procedure: Dialysis/Perma Catheter Insertion;  Surgeon: Annice Needy, MD;  Location: [facility] INVASIVE CV LAB;  Service: Cardiovascular;  Laterality: N/A;  . Revision of arteriovenous goretex graft Right 03/08/2015    Procedure: REVISION OF ARTERIOVENOUS GORETEX GRAFT;  Surgeon: Renford Dills, MD;  Location: [facility] ORS;  Service: Vascular;  Laterality: Right;  . Avgg removal Right 04/10/2015    Procedure: REMOVAL OF RIGHT ARM  ARTERIOVENOUS GORETEX GRAFT (AVGG);  Surgeon: Fransisco Hertz, MD;  Location: [facility] OR;  Service: Vascular;  Laterality: Right;  . Insertion of dialysis catheter Left 04/10/2015    Procedure: INSERTION OF DIALYSIS CATHETER LEFT GROIN;  Surgeon: Fransisco Hertz, MD;  Location: [facility] OR;  Service: Vascular;  Laterality: Left;  . Removal of a dialysis catheter Right 04/10/2015    Procedure: REMOVAL OF RIGHT GROIN DIALYSIS CATHETER;  Surgeon: Fransisco Hertz, MD;  Location: [facility] OR;  Service: Vascular;  Laterality: Right;   Family History  Problem Relation Age of Onset  . Hypertension Mother    Social History:  reports that she has never smoked. She has never used smokeless tobacco. She reports  that she does not drink alcohol or use illicit drugs. No Known Allergies Prior to Admission medications   Medication Sig Start Date End Date Taking? Authorizing Provider  acetaminophen (TYLENOL) 500 MG tablet Take 500 mg by mouth 2 (two) times daily as needed for mild pain.    Yes Historical Provider, MD  amLODipine (NORVASC) 5 MG tablet Take 2.5-5 mg by mouth daily. Take half tablet on Sundays mondays wednesdays and fridays. Whole tablet all other days of the week 04/05/12  Yes Eulis Foster, FNP  aspirin 81 MG tablet Take 81 mg by mouth daily.   Yes Historical Provider, MD  atorvastatin (LIPITOR) 20 MG tablet Take 1 tablet (20 mg total) by mouth daily at 6 PM. 04/14/15  Yes Catarina Hartshorn, MD  carvedilol (COREG) 3.125 MG tablet Take 1 tablet (3.125 mg total) by mouth 2 (two) times daily with a meal. 04/15/15  Yes Catarina Hartshorn, MD  cinacalcet (SENSIPAR) 30 MG tablet Take 30 mg by mouth daily.   Yes Historical Provider, MD  Dorzolamide HCl-Timolol Mal (COSOPT OP) Place 1 drop into both eyes daily.    Yes Historical Provider, MD  escitalopram (LEXAPRO) 10 MG tablet Take 0.5 tablets (5 mg total) by mouth daily. 03/15/15  Yes Eustaquio Boyden, MD  famotidine (PEPCID) 20 MG tablet Take 1 tablet (20 mg total) by mouth 2 (two) times daily. Patient taking differently: Take 20 mg by mouth daily.  11/20/14  Yes Calvert Cantor, MD  folic acid-vitamin b complex-vitamin c-selenium-zinc (DIALYVITE) 3 MG TABS Take 1 tablet by mouth daily.     Yes Historical Provider, MD  multivitamin (RENA-VIT) TABS tablet TAKE ONE TABLET BY MOUTH AT BEDTIME 05/14/15  Yes Eustaquio Boyden, MD  promethazine (PHENERGAN) 25 MG tablet Take 12.5 mg by mouth every 6 (six) hours as needed for nausea or vomiting.   Yes Historical Provider, MD  quiNINE (QUALAQUIN) 324 MG capsule Take 324 mg by mouth 3 (three) times a week. Taking on Tuesday, Thursday, and Saturday per daughter   Yes Historical Provider, MD  sevelamer carbonate (RENVELA) 800 MG  tablet Take 2 tablets (1,600 mg total) by mouth 3 (three) times daily with meals. 04/14/15  Yes Catarina Hartshorn, MD   Current Facility-Administered Medications  Medication Dose Route Frequency Provider Last Rate Last Dose  . acetaminophen (TYLENOL) tablet 650 mg  650 mg Oral Q6H PRN Eduard Clos, MD       Or  . acetaminophen (TYLENOL) suppository 650 mg  650 mg Rectal Q6H PRN Eduard Clos, MD      . amLODipine (NORVASC) tablet 2.5-5 mg  2.5-5 mg Oral Daily Eduard Clos, MD      . aspirin EC tablet 81 mg  81 mg Oral Daily Eduard Clos, MD   81 mg at 05/15/15 1023  . atorvastatin (LIPITOR) tablet 20 mg  20 mg Oral q1800 Eduard Clos, MD      . carvedilol (COREG) tablet 3.125 mg  3.125 mg Oral BID WC  Eduard Clos, MD   3.125 mg at 05/15/15 1610  . cinacalcet (SENSIPAR) tablet 30 mg  30 mg Oral Q breakfast Eduard Clos, MD   30 mg at 05/15/15 9604  . enoxaparin (LOVENOX) injection 30 mg  30 mg Subcutaneous Daily Eduard Clos, MD   30 mg at 05/15/15 1025  . escitalopram (LEXAPRO) tablet 5 mg  5 mg Oral Daily Eduard Clos, MD   5 mg at 05/15/15 1023  . famotidine (PEPCID) tablet 20 mg  20 mg Oral Daily Eduard Clos, MD   20 mg at 05/15/15 1025  . insulin aspart (novoLOG) injection 0-9 Units  0-9 Units Subcutaneous TID WC Eduard Clos, MD   Stopped at 05/15/15 [phone]  . multivitamin (RENA-VIT) tablet 1 tablet  1 tablet Oral QHS Eduard Clos, MD      . ondansetron [facility]) tablet 4 mg  4 mg Oral Q6H PRN Eduard Clos, MD       Or  . ondansetron [facility]) injection 4 mg  4 mg Intravenous Q6H PRN Eduard Clos, MD      . piperacillin-tazobactam (ZOSYN) IVPB 2.25 g  2.25 g Intravenous 3 times per day Eduard Clos, MD 100 mL/hr at 05/15/15 0621 2.25 g at 05/15/15 8119  . promethazine (PHENERGAN) tablet 12.5 mg  12.5 mg Oral Q6H PRN Eduard Clos, MD      . sevelamer carbonate (RENVELA) tablet 1,600 mg  1,600  mg Oral TID WC Eduard Clos, MD   1,600 mg at 05/15/15 0813  . sodium chloride 0.9 % injection 3 mL  3 mL Intravenous Q12H Eduard Clos, MD   3 mL at 05/15/15 1027  . [START ON 05/16/2015] vancomycin (VANCOCIN) 500 mg in sodium chloride 0.9 % 100 mL IVPB  500 mg Intravenous Q T,Th,Sa-HD Eduard Clos, MD       Labs: Basic Metabolic Panel:  Recent Labs Lab 05/14/15 2131 05/15/15 0550 05/15/15 0551  NA 139 138  --   K 4.8 5.0  --   CL 98* 99*  --   CO2 31 28  --   GLUCOSE 193* 90  --   BUN 21* 22*  --   CREATININE 5.02* 5.51* 5.65*  CALCIUM 9.1 8.8*  --    Liver Function Tests:  Recent Labs Lab 05/15/15 0550  AST 27  ALT 13*  ALKPHOS 79  BILITOT 0.9  PROT 7.4  ALBUMIN 3.3*    Recent Labs Lab 05/15/15 0735  AMMONIA 26   CBC:  Recent Labs Lab 05/14/15 2131 05/15/15 0550  WBC 4.5 4.6  NEUTROABS  --  2.4  HGB 12.1 12.4  HCT 38.7 38.7  MCV 89.0 88.0  PLT 90* 80*  CBG:  Recent Labs Lab 05/15/15 0755 05/15/15 0927  GLUCAP 90 99   Studies/Results: Ct Head Wo Contrast  05/15/2015  CLINICAL DATA:  Acute encephalopathy. EXAM: CT HEAD WITHOUT CONTRAST TECHNIQUE: Contiguous axial images were obtained from the base of the skull through the vertex without intravenous contrast. COMPARISON:  CT scan of April 08, 2015. FINDINGS: Bony calvarium appears intact. Moderate diffuse cortical atrophy is noted. Mild chronic ischemic white matter disease is noted. No mass effect or midline shift is noted. Ventricular size is within normal limits. There is no evidence of mass lesion, hemorrhage or acute infarction. IMPRESSION: Moderate diffuse cortical atrophy. Mild chronic ischemic white matter disease. No acute intracranial abnormality seen. Electronically Signed   By: Roque Lias  Montez HagemanJr, M.D.   On: 05/15/2015 11:25   Dg Chest Port 1 View  05/15/2015  CLINICAL DATA:  Acute onset of shortness of breath. Sepsis. Initial encounter. EXAM: PORTABLE CHEST 1 VIEW  COMPARISON:  Chest radiograph from 04/10/2015 FINDINGS: The lungs are well-aerated. Pulmonary vascularity is at the upper limits of normal. There is no evidence of focal opacification, pleural effusion or pneumothorax. The cardiomediastinal silhouette is mildly enlarged. A right-sided vascular stent is noted. No acute osseous abnormalities are seen. Scattered clips are noted about the left axilla. IMPRESSION: Mild cardiomegaly.  Lungs remain grossly clear. Electronically Signed   By: Roanna RaiderJeffery  Chang M.D.   On: 05/15/2015 05:11    ROS: As per HPI otherwise negative. Physical Exam: Filed Vitals:   05/15/15 0600 05/15/15 0800 05/15/15 0823 05/15/15 0934  BP: 155/73 182/77  187/70  Pulse:   79 70  Temp:    98.6 F (37 C)  TempSrc:    Oral  Resp: 11 10  16   Height:    5' (1.524 m)  Weight:    59.8 kg (131 lb 13.4 oz)  SpO2:    100%     General: Elderly AAF in no acute distress eating a late breakfast - drinking OJ Head: Normocephalic, atraumatic, sclera non-icteric, mucus membranes are moist Neck: Supple. JVD not elevated. Lungs: Clear bilaterally to auscultation without wheezes, rales, or rhonchi. Breathing is unlabored. Heart: RRR with S1 S2.  Abdomen: Soft, non-tender, non-distended with normoactive bowel sounds. No rebound/guarding. No obvious abdominal masses. Upper extremities:  Right upper arm wounds site healing well, no drainage. Old right lower hematoma improving Lower extremities:  Right groin wound healed - [facility] ? Scar; no edema or ischemic changes, no open wounds  Neuro: Alert  Moves all extremities spontaneously.-"you look like someone I know named [name]" [place] ? Year - Trump is the President Psych:  Responds to questions appropriately with a normal affect. Dialysis Access: right upper AVF HERO+ bruit and left thigh TDC - no drainage at cath exit site;   [facility] -gets +/-  3.75 hr 160 350/A 1.5 2 K  2.25 Ca profile 2 right upper HERO and right leg TDC heparin 3500 Mircera 150 last 12/13 off venofer Hectorol 2 down from 3 Recent labs: Hgb 12.2 12/8 - tsat 30% ferritin 742 iPTH 112 sensipar 30 renvela 2 ac per note - not in med list   Assessment/Plan: 1. Acute encephalopathy - usually for this lady, associated with underlying infection. WBC and diff negative. Urine and blood cultures pending; on enteric precautions. Seems variably confused today CXR  And Head CT neg 2. ESRD -  TTS - HD tomorrow K 4.8 post HD 12/13 suprisingly high -kinetics were ok during treatment 3. Hypertension/volume  - BP tend to be higher pre and post and drops down some during HD treatments/change norvasc to HS 4. Anemia  - Hgb 12.4 - hold ESA - no Fe for now 5. Metabolic bone disease -  Continue hectorol which was recently lowered/renvela/sensipar 6. Nutrition - renal/carb mod diet/nepro and vit 7. DM - per primary 8. Chronic thrombocytopenia - follow  Sheffield SliderMartha B Bergman, PA-C [facility] Beeper [phone] 05/15/2015, 11:43 AM   Pt seen, examined and agree w A/P as above.  Vinson Moselleob Bonne Whack MD [facility] pager [phone]    cell [phone] 05/15/2015, 3:31 PM

## 2015-05-16 DIAGNOSIS — R197 Diarrhea, unspecified: Secondary | ICD-10-CM

## 2015-05-16 DIAGNOSIS — I10 Essential (primary) hypertension: Secondary | ICD-10-CM

## 2015-05-16 LAB — RENAL FUNCTION PANEL
Albumin: 2.9 g/dL — ABNORMAL LOW (ref 3.5–5.0)
Anion gap: 11 (ref 5–15)
BUN: 29 mg/dL — ABNORMAL HIGH (ref 6–20)
CO2: 29 mmol/L (ref 22–32)
Calcium: 8.8 mg/dL — ABNORMAL LOW (ref 8.9–10.3)
Chloride: 96 mmol/L — ABNORMAL LOW (ref 101–111)
Creatinine, Ser: 7.08 mg/dL — ABNORMAL HIGH (ref 0.44–1.00)
GFR calc Af Amer: 5 mL/min — ABNORMAL LOW (ref >60)
GFR calc non Af Amer: 5 mL/min — ABNORMAL LOW (ref >60)
Glucose, Bld: 95 mg/dL (ref 65–99)
Phosphorus: 3.6 mg/dL (ref 2.5–4.6)
Potassium: 6.4 mmol/L (ref 3.5–5.1)
Sodium: 136 mmol/L (ref 135–145)

## 2015-05-16 LAB — CBC
HCT: 38.3 % (ref 36.0–46.0)
Hemoglobin: 12.6 g/dL (ref 12.0–15.0)
MCH: 28.4 pg (ref 26.0–34.0)
MCHC: 32.9 g/dL (ref 30.0–36.0)
MCV: 86.3 fL (ref 78.0–100.0)
Platelets: 96 10*3/uL — ABNORMAL LOW (ref 150–400)
RBC: 4.44 MIL/uL (ref 3.87–5.11)
RDW: 18.9 % — ABNORMAL HIGH (ref 11.5–15.5)
WBC: 6 10*3/uL (ref 4.0–10.5)

## 2015-05-16 LAB — URINE CULTURE

## 2015-05-16 LAB — GLUCOSE, CAPILLARY
GLUCOSE-CAPILLARY: 91 mg/dL (ref 65–99)
Glucose-Capillary: 57 mg/dL — ABNORMAL LOW (ref 65–99)
Glucose-Capillary: 120 mg/dL — ABNORMAL HIGH (ref 65–99)

## 2015-05-16 MED ORDER — LIDOCAINE HCL (PF) 1 % IJ SOLN: 5.0000 mL | INTRAMUSCULAR | Status: DC | PRN

## 2015-05-16 MED ORDER — ALTEPLASE 2 MG IJ SOLR: 2.0000 mg | Freq: Once | INTRAMUSCULAR | Status: DC | PRN

## 2015-05-16 MED ORDER — LIDOCAINE-PRILOCAINE 2.5-2.5 % EX CREA: 1.0000 "application " | TOPICAL_CREAM | CUTANEOUS | Status: DC | PRN

## 2015-05-16 MED ORDER — SODIUM CHLORIDE 0.9 % IV SOLN: 100.0000 mL | INTRAVENOUS | Status: DC | PRN

## 2015-05-16 MED ORDER — QUETIAPINE FUMARATE 25 MG PO TABS
25.0000 mg | ORAL_TABLET | Freq: Two times a day (BID) | ORAL | Status: DC
  Administered 2015-05-16 – 2015-05-18 (×4): 25 mg via ORAL
  Filled 2015-05-16 (×5): qty 1

## 2015-05-16 MED ORDER — DEXTROSE 50 % IV SOLN
INTRAVENOUS | Status: AC
  Administered 2015-05-16: 50 mL
  Filled 2015-05-16: qty 50

## 2015-05-16 MED ORDER — DOXERCALCIFEROL 4 MCG/2ML IV SOLN
INTRAVENOUS | Status: AC
Start: 2015-05-16 — End: 2015-05-16
  Filled 2015-05-16: qty 2

## 2015-05-16 MED ORDER — HEPARIN SODIUM (PORCINE) 1000 UNIT/ML DIALYSIS: 1000.0000 [IU] | INTRAMUSCULAR | Status: DC | PRN

## 2015-05-16 MED ORDER — HYDRALAZINE HCL 20 MG/ML IJ SOLN
2.0000 mg | Freq: Once | INTRAMUSCULAR | Status: AC
  Administered 2015-05-16: 2 mg via INTRAVENOUS
  Filled 2015-05-16: qty 1

## 2015-05-16 MED ORDER — HEPARIN SODIUM (PORCINE) 1000 UNIT/ML DIALYSIS
20.0000 [IU]/kg | INTRAMUSCULAR | Status: DC | PRN
  Administered 2015-05-16: 1200 [IU] via INTRAVENOUS_CENTRAL
  Filled 2015-05-16: qty 2

## 2015-05-16 MED ORDER — PENTAFLUOROPROP-TETRAFLUOROETH EX AERO: 1.0000 "application " | INHALATION_SPRAY | CUTANEOUS | Status: DC | PRN

## 2015-05-16 MED ORDER — HALOPERIDOL LACTATE 5 MG/ML IJ SOLN
2.0000 mg | Freq: Once | INTRAMUSCULAR | Status: AC
  Administered 2015-05-16: 2 mg via INTRAVENOUS
  Filled 2015-05-16: qty 1

## 2015-05-16 NOTE — Progress Notes (Signed)
Hypoglycemic Event  CBG: 57  Treatment: D50 IV 50 mL  Symptoms: None  Follow-up CBG: Time: pt refused CBG Result: pt refused  Possible Reasons for Event: Inadequate meal intake  Comments/MD notified:Dr. Dhungel notified    [name], [name]

## 2015-05-16 NOTE — Progress Notes (Signed)
[facility]  Patient Status: Active (receiving services up to time of hospitalization)  [facility] is providing the following services: RN and PT  If patient discharges after hours, please call [phone].   Kizzie FurnishDonna Fellmy 05/16/2015, 3:19 PM

## 2015-05-16 NOTE — Progress Notes (Addendum)
[facility] PROGRESS NOTE  [name] UJW:[id number] DOB: 1929-01-16 DOA: 05/14/2015 PCP: Eustaquio Boyden, MD  Narrative 81 year old female with end-stage renal disease on hemodialysis (CTS) type 2 diabetes mellitus, GERD, hypertension, with multiple UTIs or to the ED after found to be increasingly confused for the past 3 days she also had increasing diarrhea for the past 2 days. No history of nausea, vomiting, shortness of breath, fevers, chills , chest pain or abdominal pain. In the ED UA was suggestive of UTI. She also had mildly elevated lactic acid low-grade temperature. She was admitted one month back for sepsis from right AV graft infection. Admitted to hospitalist service for further management of acute encephalopathy.  Assessment/Plan: Acute encephalopathy Possibly secondary to underlying UTI. Also having diarrhea. Follow blood cultures, urine cultures and GI pathogen panel. On empiric vancomycin and Zosyn. Subsequent lactic acid normalized. Ammonia level and pro calcitonin normal. Head CT and chest x-ray unremarkable. -Avoid benzos or narcotics. Quinine held. Discontinue Phenergan and Lexapro. Will order low-dose Seroquel. -Patient appears more confused today and calling out people. Elevated blood pressure is possibly also playing a role. Will monitor closely.   Diarrhea Follow GI pathogen panel. Enteric precautions.  ESRD on hemodialysis (TTS) Patient missed her last dialysis as per daughter. Renal consulted. Dialysis done today..conitue hectoral, renvela and sesipar.  Essential hypertension Continue Norvasc and Coreg. Has low blood pressure following hemodialysis which needs to be monitored. Continue pectoral, Renvela and Sensipar.  Type 2 diabetes mellitus Continue sliding scale coverage.  Chronic thrombocytopenia Stable. Monitor.  DVT prophylaxis Diet: Renal  Code Status: Full code Family Communication: None at bedside. Unable to reach her daughter on the  phone Disposition Plan: Continue inpatient monitoring.   Consultants:  Renal  Procedures:  Head CT  Antibiotics:  IV vancomycin and Zosyn  HPI/Subjective: Seen and examined. Admission H&P reviewed. Remains confused.  Objective: Filed Vitals:   05/16/15 1333 05/16/15 1337  BP: 152/57 189/53  Pulse: 84 82  Temp: 98.4 F (36.9 C)   Resp: 17     Intake/Output Summary (Last 24 hours) at 05/16/15 1404 Last data filed at 05/16/15 1337  Gross per 24 hour  Intake    290 ml  Output   2000 ml  Net  -1710 ml   Filed Weights   05/15/15 1954 05/16/15 0937 05/16/15 1333  Weight: 60.2 kg (132 lb 11.5 oz) 59.8 kg (131 lb 13.4 oz) 57.8 kg (127 lb 6.8 oz)    Exam:   General:  Elderly female lying in bed not in distress, confused  HEENT: No pallor, moist mucosa  Chest: Clear to auscultation bilaterally   CVS: Normal S1 and S2, no murmurs  GI: Soft, nondistended, nontender, bowel sounds present  Musculoskeletal: Warm, no edema, left groin HD catheter  CNS: awake but very confused    Data Reviewed: Basic Metabolic Panel:  Recent Labs Lab 05/14/15 2131 05/15/15 0550 05/15/15 0551 05/16/15 0950  NA 139 138  --  136  K 4.8 5.0  --  6.4*  CL 98* 99*  --  96*  CO2 31 28  --  29  GLUCOSE 193* 90  --  95  BUN 21* 22*  --  29*  CREATININE 5.02* 5.51* 5.65* 7.08*  CALCIUM 9.1 8.8*  --  8.8*  PHOS  --   --   --  3.6   Liver Function Tests:  Recent Labs Lab 05/15/15 0550 05/16/15 0950  AST 27  --   ALT 13*  --  ALKPHOS 79  --   BILITOT 0.9  --   PROT 7.4  --   ALBUMIN 3.3* 2.9*   No results for input(s): LIPASE, AMYLASE in the last 168 hours.  Recent Labs Lab 05/15/15 0735  AMMONIA 26   CBC:  Recent Labs Lab 05/14/15 2131 05/15/15 0550 05/16/15 0950  WBC 4.5 4.6 6.0  NEUTROABS  --  2.4  --   HGB 12.1 12.4 12.6  HCT 38.7 38.7 38.3  MCV 89.0 88.0 86.3  PLT 90* 80* 96*   Cardiac Enzymes: No results for input(s): CKTOTAL, CKMB, CKMBINDEX,  TROPONINI in the last 168 hours. BNP (last 3 results) No results for input(s): BNP in the last 8760 hours.  ProBNP (last 3 results) No results for input(s): PROBNP in the last 8760 hours.  CBG:  Recent Labs Lab 05/15/15 0927 05/15/15 1137 05/15/15 1728 05/15/15 1953 05/16/15 0812  GLUCAP 99 83 81 129* 91    Recent Results (from the past 240 hour(s))  Urine culture     Status: None   Collection Time: 05/14/15  9:19 PM  Result Value Ref Range Status   Specimen Description URINE, CATHETERIZED  Final   Special Requests NONE  Final   Culture 1,000 COLONIES/mL INSIGNIFICANT GROWTH  Final   Report Status 05/16/2015 FINAL  Final  Blood culture (routine x 2)     Status: None (Preliminary result)   Collection Time: 05/14/15 11:10 PM  Result Value Ref Range Status   Specimen Description BLOOD RIGHT ARM  Final   Special Requests BOTTLES DRAWN AEROBIC AND ANAEROBIC 5ML  Final   Culture NO GROWTH < 12 HOURS  Final   Report Status PENDING  Incomplete  Blood culture (routine x 2)     Status: None (Preliminary result)   Collection Time: 05/14/15 11:17 PM  Result Value Ref Range Status   Specimen Description BLOOD RIGHT HAND  Final   Special Requests BOTTLES DRAWN AEROBIC AND ANAEROBIC 5ML  Final   Culture NO GROWTH < 12 HOURS  Final   Report Status PENDING  Incomplete     Studies: Ct Head Wo Contrast  05/15/2015  CLINICAL DATA:  Acute encephalopathy. EXAM: CT HEAD WITHOUT CONTRAST TECHNIQUE: Contiguous axial images were obtained from the base of the skull through the vertex without intravenous contrast. COMPARISON:  CT scan of April 08, 2015. FINDINGS: Bony calvarium appears intact. Moderate diffuse cortical atrophy is noted. Mild chronic ischemic white matter disease is noted. No mass effect or midline shift is noted. Ventricular size is within normal limits. There is no evidence of mass lesion, hemorrhage or acute infarction. IMPRESSION: Moderate diffuse cortical atrophy. Mild  chronic ischemic white matter disease. No acute intracranial abnormality seen. Electronically Signed   By: Lupita RaiderJames  Green Jr, M.D.   On: 05/15/2015 11:25   Dg Chest Port 1 View  05/15/2015  CLINICAL DATA:  Acute onset of shortness of breath. Sepsis. Initial encounter. EXAM: PORTABLE CHEST 1 VIEW COMPARISON:  Chest radiograph from 04/10/2015 FINDINGS: The lungs are well-aerated. Pulmonary vascularity is at the upper limits of normal. There is no evidence of focal opacification, pleural effusion or pneumothorax. The cardiomediastinal silhouette is mildly enlarged. A right-sided vascular stent is noted. No acute osseous abnormalities are seen. Scattered clips are noted about the left axilla. IMPRESSION: Mild cardiomegaly.  Lungs remain grossly clear. Electronically Signed   By: Roanna RaiderJeffery  Chang M.D.   On: 05/15/2015 05:11    Scheduled Meds: . amLODipine  5 mg Oral QHS  . aspirin  EC  81 mg Oral Daily  . atorvastatin  20 mg Oral q1800  . carvedilol  3.125 mg Oral BID WC  . cinacalcet  30 mg Oral Q breakfast  . doxercalciferol  2 mcg Intravenous Q T,Th,Sa-HD  . enoxaparin (LOVENOX) injection  30 mg Subcutaneous Daily  . escitalopram  5 mg Oral Daily  . famotidine  20 mg Oral Daily  . feeding supplement (NEPRO CARB STEADY)  237 mL Oral BID BM  . insulin aspart  0-9 Units Subcutaneous TID WC  . multivitamin  1 tablet Oral QHS  . piperacillin-tazobactam (ZOSYN)  IV  2.25 g Intravenous 3 times per day  . sevelamer carbonate  1,600 mg Oral TID WC  . sodium chloride  3 mL Intravenous Q12H  . vancomycin  500 mg Intravenous Q T,Th,Sa-HD   Continuous Infusions:    Time spent: 25 minutes    Amogh Komatsu  [facility] Pager [phone]. If 7PM-7AM, please contact night-coverage at [email or web address], password [facility] 05/16/2015, 2:04 PM  LOS: 2 days

## 2015-05-16 NOTE — Progress Notes (Signed)
Attempted to administer pt's medications and pt is confused and very combative. Grabbed this RN's arm with both hands, twisted, and refused to let go until other nursing staff pried the pt's fingers off. Mittens applied as pt attempted to pull out IV. MD notified. Will continue to monitor.

## 2015-05-16 NOTE — Progress Notes (Signed)
Patients blood pressure 203/78. PA on call notified. Orders placed for 2mg  of hydralazine. Will continue to monitor patient.  Feliciana RossettiLaura Matasha Smigelski, RN, BSN

## 2015-05-16 NOTE — Progress Notes (Signed)
Dialysis treatment completed.  2500 mL ultrafiltrated.  2000 mL net fluid removal.  Patient status unchanged. Lung sounds clear to ausculation in all fields. No edema. Cardiac: Regular R&R.  Cleansed left femoarl catheter with chlorhexidine.  Disconnected lines and flushed ports with saline per protocol.  Ports locked with heparin and capped per protocol.    Report given to bedside, RN Lurena Joinerebecca.

## 2015-05-16 NOTE — Clinical Documentation Improvement (Signed)
Hospitalist      Please provide clinical indicators, historical, or baseline comparative data to support your documented diagnosis of Sepsis  Such as: lab values, treatment plan, etc  or  If the diagnosis is no longer applicable, amend your documentation as appropriate. Resolved  Ruled out    Please exercise your independent, professional judgment when responding. A specific answer is not anticipated or expected.   Thank You, Harless Littenebora T Jazmon Kos Health Information Management [place] [facility] [phone]

## 2015-05-16 NOTE — Progress Notes (Signed)
Pt removed IV from L wrist. Attempted to place another IV in L hand unsuccessfully. Noted some swelling in L arm as well and elevated extremity.

## 2015-05-16 NOTE — Progress Notes (Signed)
Critical lab: K of 6.4.  Physician notified at bedside.  Acid bath changed to 1K.  Will continue to monitor.

## 2015-05-16 NOTE — Progress Notes (Signed)
[facility] Progress Note   Subjective: more responsive and cooperative today  Filed Vitals:   05/16/15 1004 05/16/15 1018 05/16/15 1033 05/16/15 1048  BP: 183/77 165/63 185/78 176/68  Pulse: 81 78 79 80  Temp:      TempSrc:      Resp:      Height:      Weight:      SpO2:        Inpatient medications: . amLODipine  5 mg Oral QHS  . aspirin EC  81 mg Oral Daily  . atorvastatin  20 mg Oral q1800  . carvedilol  3.125 mg Oral BID WC  . cinacalcet  30 mg Oral Q breakfast  . doxercalciferol  2 mcg Intravenous Q T,Th,Sa-HD  . enoxaparin (LOVENOX) injection  30 mg Subcutaneous Daily  . escitalopram  5 mg Oral Daily  . famotidine  20 mg Oral Daily  . feeding supplement (NEPRO CARB STEADY)  237 mL Oral BID BM  . insulin aspart  0-9 Units Subcutaneous TID WC  . multivitamin  1 tablet Oral QHS  . piperacillin-tazobactam (ZOSYN)  IV  2.25 g Intravenous 3 times per day  . sevelamer carbonate  1,600 mg Oral TID WC  . sodium chloride  3 mL Intravenous Q12H  . vancomycin  500 mg Intravenous Q T,Th,Sa-HD     sodium chloride, sodium chloride, acetaminophen **OR** acetaminophen, alteplase, heparin, heparin, lidocaine (PF), lidocaine-prilocaine, ondansetron **OR** ondansetron (ZOFRAN) IV, pentafluoroprop-tetrafluoroeth, promethazine  Exam: Elderly AAF, no distress, confused No jvd Chest clear bilat RRR no MRG Abd soft ntnd +bs no rebound or mass RUA HeRO access wound healing well, +bruit, no drainage; old cath site R groin some induration but not flucuant, red or tender, doubt any abscess. Current HD cath L groin clean exit site.   Neuro is more cooperative today, still confused  [facility]  3h  58kg   2/2.25 bath  L thigh TDC  / RUA HeRO Mircera 150 q2, last 12/13 Hect 2 ug Labs Hb 12.2, tfs 30%  ferr 742  pth 112  sens 30/ renvela 2ac  Assessment: 1 AMS prob d/t UTI 2 UTI 3 ESRD maturing HeRO, L thigh cath 4 DM per prim 5 MBD stable 6 Anemia no esa, Hb  up 7 HTN norvasc/ coreg,ok   Plan - abx, HD today, ms improving   Michelle Moselle MD [facility] pager [phone]    cell [phone] 05/16/2015, 10:55 AM    Recent Labs Lab 05/14/15 2131 05/15/15 0550 05/15/15 0551 05/16/15 0950  NA 139 138  --  136  K 4.8 5.0  --  6.4*  CL 98* 99*  --  96*  CO2 31 28  --  29  GLUCOSE 193* 90  --  95  BUN 21* 22*  --  29*  CREATININE 5.02* 5.51* 5.65* 7.08*  CALCIUM 9.1 8.8*  --  8.8*  PHOS  --   --   --  3.6    Recent Labs Lab 05/15/15 0550 05/16/15 0950  AST 27  --   ALT 13*  --   ALKPHOS 79  --   BILITOT 0.9  --   PROT 7.4  --   ALBUMIN 3.3* 2.9*    Recent Labs Lab 05/14/15 2131 05/15/15 0550 05/16/15 0950  WBC 4.5 4.6 6.0  NEUTROABS  --  2.4  --   HGB 12.1 12.4 12.6  HCT 38.7 38.7 38.3  MCV 89.0 88.0 86.3  PLT 90* 80*  PENDING

## 2015-05-16 NOTE — Clinical Documentation Improvement (Signed)
Hospitalist   Can the diagnosis of encephalopathy be further specified in progress notes and discharge summary?   Metabolic Encephalopathy  Other  Clinically Undetermined  Document any associated diagnoses/conditions.   Supporting Information: 81 year old Chief Complaint: Altered mental status H&P: Patient had been admitted for acute encephalopathy most likely from infectious cause UTI. Patient also has diarrhea. Acute encephalopathy probably from infectious causes and possible developing sepsis - possibly UTIs could be the source. Patient also has diarrhea. At this time blood cultures urine cultures and GI pathogen panel has been ordered and patient has been placed on broad-spectrum antibiotics until cultures are available. Follow lactic acid levels. Procalcitonin levels. Check ammonia levels. I'm holding off patient's Quinine for now.  12/14 progress note:  Assessment/Plan: Acute encephalopathy Possibly secondary to underlying UTI. Also having diarrhea. Follow blood cultures, urine cultures and GI pathogen panel. On empiric vancomycin and Zosyn. Subsequent lactic acid normalized. Ammonia level and pro calcitonin normal. Head CT and chest x-ray unremarkable. -Avoid benzos or narcotics. Quinine held. Component  Latest Ref Rng 05/14/2015  Color, Urine  YELLOW YELLOW  Appearance  CLEAR TURBID (A)  Specific Gravity, Urine  1.005 - 1.030 1.018  pH  5.0 - 8.0 8.0  Glucose  NEGATIVE mg/dL NEGATIVE  Hgb urine dipstick  NEGATIVE LARGE (A)  Bilirubin Urine  NEGATIVE SMALL (A)  Ketones, ur  NEGATIVE mg/dL NEGATIVE  Protein  NEGATIVE mg/dL >300 (A)  Nitrite  NEGATIVE NEGATIVE  Leukocytes, UA  NEGATIVE LARGE (A)   Component  Latest Ref Rng 05/14/2015  Squamous Epithelial / LPF  NONE SEEN 0-5 (A)  WBC, UA  0 - 5 WBC/hpf TOO NUMEROUS TO COUNT  RBC / HPF  0 - 5 RBC/hpf 0-5  Bacteria,  UA  NONE SEEN MANY (A)  Urine-Other   MICROSCOPIC EXAM PERFORMED ON UNCONCENTRATED URINE   IV zosyn q8h & vancomyacin T-Th-Sa        Please exercise your independent, professional judgment when responding. A specific answer is not anticipated or expected.   Thank You,  Joud Pettinato T Tacey Dimaggio Health Information Management [place] [phone] 

## 2015-05-16 NOTE — Progress Notes (Signed)
Patient arrived to unit by bed.  Reviewed treatment plan and this RN agrees with plan.  Report received from bedside RN, Lurena Joinerebecca.  Consent obtained.  Patient alert, oriented to first name only, confused and combative at times.   Lung sounds clear to ausculation in all fields. No edema. Cardiac:  Regular R&R.  Removed caps and cleansed left femoral catheter with chlorhedxidine.  Aspirated ports of heparin and flushed them with saline per protocol.  Connected and secured lines, initiated treatment at 0950.  UF Goal of 2500mL and net fluid removal 2L.  Will continue to monitor.

## 2015-05-17 DIAGNOSIS — E11649 Type 2 diabetes mellitus with hypoglycemia without coma: Secondary | ICD-10-CM

## 2015-05-17 LAB — BASIC METABOLIC PANEL
ANION GAP: 9 (ref 5–15)
BUN: 15 mg/dL (ref 6–20)
CHLORIDE: 99 mmol/L — AB (ref 101–111)
CO2: 28 mmol/L (ref 22–32)
Calcium: 8.8 mg/dL — ABNORMAL LOW (ref 8.9–10.3)
Creatinine, Ser: 5.46 mg/dL — ABNORMAL HIGH (ref 0.44–1.00)
GFR calc non Af Amer: 6 mL/min — ABNORMAL LOW (ref >60)
GFR, EST AFRICAN AMERICAN: 7 mL/min — AB (ref >60)
Glucose, Bld: 103 mg/dL — ABNORMAL HIGH (ref 65–99)
POTASSIUM: 4.4 mmol/L (ref 3.5–5.1)
Sodium: 136 mmol/L (ref 135–145)

## 2015-05-17 LAB — GLUCOSE, CAPILLARY
GLUCOSE-CAPILLARY: 114 mg/dL — AB (ref 65–99)
Glucose-Capillary: 101 mg/dL — ABNORMAL HIGH (ref 65–99)
Glucose-Capillary: 122 mg/dL — ABNORMAL HIGH (ref 65–99)
Glucose-Capillary: 62 mg/dL — ABNORMAL LOW (ref 65–99)
Glucose-Capillary: 94 mg/dL (ref 65–99)

## 2015-05-17 LAB — CBC
HEMATOCRIT: 38 % (ref 36.0–46.0)
Hemoglobin: 12.2 g/dL (ref 12.0–15.0)
MCH: 27.9 pg (ref 26.0–34.0)
MCHC: 32.1 g/dL (ref 30.0–36.0)
MCV: 87 fL (ref 78.0–100.0)
Platelets: 99 10*3/uL — ABNORMAL LOW (ref 150–400)
RBC: 4.37 MIL/uL (ref 3.87–5.11)
RDW: 19.1 % — ABNORMAL HIGH (ref 11.5–15.5)
WBC: 4.3 10*3/uL (ref 4.0–10.5)

## 2015-05-17 MED ORDER — DEXTROSE 50 % IV SOLN
INTRAVENOUS | Status: AC
  Administered 2015-05-17: 25 mL
  Filled 2015-05-17: qty 50

## 2015-05-17 MED ORDER — HYDRALAZINE HCL 20 MG/ML IJ SOLN
10.0000 mg | Freq: Once | INTRAMUSCULAR | Status: AC
  Administered 2015-05-17: 10 mg via INTRAVENOUS
  Filled 2015-05-17: qty 1

## 2015-05-17 NOTE — Care Management Important Message (Signed)
Important Message  Patient Details  Name: [name] MRN: [id number] Date of Birth: 1928-08-18   Medicare Important Message Given:  Yes    Staceyann Knouff, Annamarie Majorheryl U, RN 05/17/2015, 12:34 PM

## 2015-05-17 NOTE — Clinical Documentation Improvement (Signed)
Hospitalist   Can the diagnosis of encephalopathy be further specified in progress notes and discharge summary?   Metabolic Encephalopathy  Other  Clinically Undetermined  Document any associated diagnoses/conditions.   Supporting Information: [age] year old Chief Complaint: Altered mental status H&P: Patient had been admitted for acute encephalopathy most likely from infectious cause UTI. Patient also has diarrhea. Acute encephalopathy probably from infectious causes and possible developing sepsis - possibly UTIs could be the source. Patient also has diarrhea. At this time blood cultures urine cultures and GI pathogen panel has been ordered and patient has been placed on broad-spectrum antibiotics until cultures are available. Follow lactic acid levels. Procalcitonin levels. Check ammonia levels. I'm holding off patient's Quinine for now.  12/14 progress note:  Assessment/Plan: Acute encephalopathy Possibly secondary to underlying UTI. Also having diarrhea. Follow blood cultures, urine cultures and GI pathogen panel. On empiric vancomycin and Zosyn. Subsequent lactic acid normalized. Ammonia level and pro calcitonin normal. Head CT and chest x-ray unremarkable. -Avoid benzos or narcotics. Quinine held. Component  Latest Ref Rng 05/14/2015  Color, Urine  YELLOW YELLOW  Appearance  CLEAR TURBID (A)  Specific Gravity, Urine  1.005 - 1.030 1.018  pH  5.0 - 8.0 8.0  Glucose  NEGATIVE mg/dL NEGATIVE  Hgb urine dipstick  NEGATIVE LARGE (A)  Bilirubin Urine  NEGATIVE SMALL (A)  Ketones, ur  NEGATIVE mg/dL NEGATIVE  Protein  NEGATIVE mg/dL >098>300 (A)  Nitrite  NEGATIVE NEGATIVE  Leukocytes, UA  NEGATIVE LARGE (A)   Component  Latest Ref Rng 05/14/2015  Squamous Epithelial / LPF  NONE SEEN 0-5 (A)  WBC, UA  0 - 5 WBC/hpf TOO NUMEROUS TO COUNT  RBC / HPF  0 - 5 RBC/hpf 0-5  Bacteria,  UA  NONE SEEN MANY (A)  Urine-Other   MICROSCOPIC EXAM PERFORMED ON UNCONCENTRATED URINE   IV zosyn q8h & vancomyacin T-Th-Sa        Please exercise your independent, professional judgment when responding. A specific answer is not anticipated or expected.   Thank You,  Harless Littenebora T Kagan Hietpas Health Information Management [place] [phone]

## 2015-05-17 NOTE — Progress Notes (Signed)
Pt's BP 177/69. Pt refused to take evening PO medications, including Coreg. Dr. Gonzella Lexhungel notified and orders received. Will give 10 mg IV hydralazine and notify oncoming RN to monitor.

## 2015-05-17 NOTE — Progress Notes (Signed)
Hypoglycemic Event  CBG: 62  Treatment: D50 IV 25 mL  Symptoms: None  Follow-up CBG: Time: 1800 CBG Result: 94  Possible Reasons for Event: Inadequate meal intake    Pernell DupreAdams, Ty TyJasmine N

## 2015-05-17 NOTE — Progress Notes (Signed)
Patient of mine seen by Jae DireKate for hospital f/u visit. Pt nonambulatory, uses wheelchair. Transport chair will allow her increased mobility outside of the home safely. Her daughter assists her. She also needs commode to help with toileting needs.

## 2015-05-17 NOTE — Progress Notes (Signed)
[facility] PROGRESS NOTE  [name] ZOX:[id number] DOB: 04-20-1929 DOA: 05/14/2015 PCP: Eustaquio Boyden, MD  Narrative 82 year old female with end-stage renal disease on hemodialysis (CTS) type 2 diabetes mellitus, GERD, hypertension, with multiple UTIs or to the ED after found to be increasingly confused for the past 3 days she also had increasing diarrhea for the past 2 days. No history of nausea, vomiting, shortness of breath, fevers, chills , chest pain or abdominal pain. In the ED UA was suggestive of UTI. She also had mildly elevated lactic acid low-grade temperature. She was admitted one month back for sepsis from right AV graft infection. Admitted to hospitalist service for further management of acute encephalopathy.  Assessment/Plan: Acute encephalopathy Possibly secondary to underlying UTI. Also had diarrhea on presentation.. Cultures negative. On empiric vancomycin and Zosyn. (Narrowed to [place] and only today) Subsequent lactic acid normalized. Ammonia level and pro calcitonin normal. Head CT and chest x-ray unremarkable. -Patient increasingly confused yesterday and pulling out lines, so was placed on mittens.. At bedside who reports patient has some memory impairment it worsens when she has infections. Patient was able to recognize her son and was also able to tell that she is on dialysis. Was disoriented to place. -Avoid benzos or narcotics. Quinine held. Discontinue Phenergan and Lexapro. Ordered low-dose Seroquel.    Diarrhea No further diarrhea.  ESRD on hemodialysis (TTS)  Dialysis done on 12/15. conitue hectoral, renvela and sesipar. Renal following.  Essential hypertension Continue Norvasc and Coreg. Has low blood pressure following hemodialysis which needs to be monitored. Continue hectoral, Renvela and Sensipar.  Type 2 diabetes mellitus Was hypoglycemic on 12/15 with sufficient to 250s. Stable today. Continue sliding scale coverage.  Chronic  thrombocytopenia Stable. Monitor.  DVT prophylaxis Diet: Renal  Code Status: Full code Family Communication: None at bedside Disposition Plan: Continue inpatient monitoring. Home once encephalopathy resolves. Possibly in the next 24-48 hours.   Consultants:  Renal  Procedures:  Head CT  Antibiotics:  IV vancomycin 12/14-12/16  Zosyn 12/14--  HPI/Subjective: Seen and examined. A was more confused yesterday and restless removing her lines. Required low-dose Haldol and placed on Seroquel. Appears more oriented today with her son at bedside.  Objective: Filed Vitals:   05/17/15 0825 05/17/15 0901  BP: 159/62 160/55  Pulse: 85 79  Temp: 98.2 F (36.8 C) 97.8 F (36.6 C)  Resp: 18 18    Intake/Output Summary (Last 24 hours) at 05/17/15 1146 Last data filed at 05/17/15 1043  Gross per 24 hour  Intake    635 ml  Output   2000 ml  Net  -1365 ml   Filed Weights   05/16/15 8119 05/16/15 1333 05/16/15 2121  Weight: 59.8 kg (131 lb 13.4 oz) 57.8 kg (127 lb 6.8 oz) 57.7 kg (127 lb 3.3 oz)    Exam:   General:  Elderly female lying in bed not in distress, confused  HEENT: moist mucosa  Chest: Clear to auscultation bilaterally   CVS: Normal S1 and S2, no murmurs  GI: Soft, nondistended, nontender, bowel sounds present  Musculoskeletal: Warm, no edema, left groin HD catheter  CNS: awake and oriented to person. Still very confused    Data Reviewed: Basic Metabolic Panel:  Recent Labs Lab 05/14/15 2131 05/15/15 0550 05/15/15 0551 05/16/15 0950 05/17/15 0735  NA 139 138  --  136 136  K 4.8 5.0  --  6.4* 4.4  CL 98* 99*  --  96* 99*  CO2 31 28  --  29  28  GLUCOSE 193* 90  --  95 103*  BUN 21* 22*  --  29* 15  CREATININE 5.02* 5.51* 5.65* 7.08* 5.46*  CALCIUM 9.1 8.8*  --  8.8* 8.8*  PHOS  --   --   --  3.6  --    Liver Function Tests:  Recent Labs Lab 05/15/15 0550 05/16/15 0950  AST 27  --   ALT 13*  --   ALKPHOS 79  --   BILITOT 0.9  --    PROT 7.4  --   ALBUMIN 3.3* 2.9*   No results for input(s): LIPASE, AMYLASE in the last 168 hours.  Recent Labs Lab 05/15/15 0735  AMMONIA 26   CBC:  Recent Labs Lab 05/14/15 2131 05/15/15 0550 05/16/15 0950 05/17/15 1007  WBC 4.5 4.6 6.0 4.3  NEUTROABS  --  2.4  --   --   HGB 12.1 12.4 12.6 12.2  HCT 38.7 38.7 38.3 38.0  MCV 89.0 88.0 86.3 87.0  PLT 90* 80* 96* 99*   Cardiac Enzymes: No results for input(s): CKTOTAL, CKMB, CKMBINDEX, TROPONINI in the last 168 hours. BNP (last 3 results) No results for input(s): BNP in the last 8760 hours.  ProBNP (last 3 results) No results for input(s): PROBNP in the last 8760 hours.  CBG:  Recent Labs Lab 05/15/15 1953 05/16/15 0812 05/16/15 1456 05/16/15 2119 05/17/15 0758  GLUCAP 129* 91 57* 120* 101*    Recent Results (from the past 240 hour(s))  Urine culture     Status: None   Collection Time: 05/14/15  9:19 PM  Result Value Ref Range Status   Specimen Description URINE, CATHETERIZED  Final   Special Requests NONE  Final   Culture 1,000 COLONIES/mL INSIGNIFICANT GROWTH  Final   Report Status 05/16/2015 FINAL  Final  Blood culture (routine x 2)     Status: None (Preliminary result)   Collection Time: 05/14/15 11:10 PM  Result Value Ref Range Status   Specimen Description BLOOD RIGHT ARM  Final   Special Requests Les78.4Princel Les78.4Princella [phone] Les78.4Princella [id number] Les78.4Princella [id number] Les78.4Princella [id number] Les78.4Princella [id number] Les78.4P< Les78.4Princella [id number] Les78.4Princella [id number] 779 09[facility] Les78.4Princella [id number] Les78.4Princella [id number] Les78.4Princella Pe16109(972 Les78.4Prince Les78.4Princella [id number] 128 23[facility] Les78.4Princella [phone]37gr[facility]im[facility]02griniman>Les78.4Princella [id number] 222 66[id number]81griniman [id number]90grinimRoMarina GooPhoebe Worth Medical CenterProgress EnergNortheast UB ults found.  Scheduled Meds: . amLODipine  5 mg Oral QHS  . aspirin EC  81 mg Oral Daily  . atorvastatin  20 mg Oral q1800  . carvedilol  3.125  mg Oral BID WC  . cinacalcet  30 mg Oral Q breakfast  . doxercalciferol  2 mcg Intravenous Q T,Th,Sa-HD  . enoxaparin (LOVENOX) injection  30 mg Subcutaneous Daily  . famotidine  20 mg Oral Daily  . feeding supplement (NEPRO CARB STEADY)  237 mL Oral BID BM  . insulin aspart  0-9 Units Subcutaneous TID WC  . multivitamin  1 tablet Oral QHS  . piperacillin-tazobactam (ZOSYN)  IV  2.25 g Intravenous 3 times per day  . QUEtiapine  25 mg Oral BID  . sevelamer carbonate  1,600 mg Oral TID WC  . sodium chloride  3 mL Intravenous Q12H   Continuous Infusions:    Time  spent: 25 minutes    Eddie North  [facility] Pager [phone]. If 7PM-7AM, please contact night-coverage at [email or web address], password [facility] 05/17/2015, 11:46 AM  LOS: 3 days

## 2015-05-17 NOTE — Progress Notes (Signed)
[facility] Progress Note  Assessment/Plan: 1. AMS- urine culture neg, BC no growth pending - more confused today then when I saw her Wed. Normally recognizes me by name.  No obvious source of infection; meds per primary - started on seroquel yesterday 2. ESRD - TTS - HD tomorrow; hyperkalemic Thursday  6.4 down to 4.4 today - may be due to catheter function - evaluate Sat - if BFR low - will need to cath flo and or extend time. 3. Anemia - Hgb 12 - no ESA 4. Secondary hyperparathyroidism - Hectorol 2, sensipar renvela - P ok 5. HTN/volume - controlled  6. Nutrition - renal diet /nepro/vit 7. Thrombocytopenia - stable  Sheffield Slider, PA-C [facility] Beeper [phone] 05/17/2015,11:30 AM  LOS: 3 days   Pt seen, examined and agree w A/P as above. AMS persistent but better than on admission. Per primary service. STable from renal standpoint.  Vinson Moselle MD [facility] pager [phone]    cell [phone] 05/17/2015, 12:18 PM    Subjective:   Wants to get out of here; Cannot state the name of this place "I'm the same place you are." [place], cannot state the year, the next holiday or the President or my name.  Objective Filed Vitals:   05/16/15 2121 05/17/15 0418 05/17/15 0825 05/17/15 0901  BP: 178/68 166/57 159/62 160/55  Pulse: 82 78 85 79  Temp: 98.5 F (36.9 C) 98.7 F (37.1 C) 98.2 F (36.8 C) 97.8 F (36.6 C)  TempSrc: Axillary Oral Oral Oral  Resp: Height:      Weight: 57.7 kg (127 lb 3.3 oz)     SpO2: 99% 100% 100% 100%   Physical Exam General: sitting in bed, alert Heart: RRR Lungs: no rales Abdomen: soft NT Extremities: no LE edema, left arm slightly puffy, mittens on hands Dialysis Access: left thigh TDC right upper HERO + bruit  - prior wound healing well.  Dialysis Orders:  [facility] EDW 58. -gets +/- 3.75 hr 160 350/A 1.5 2 K 2.25 Ca profile 2 right upper HERO and right leg TDC heparin 3500  Mircera 150 last 12/13 off venofer Hectorol 2 down from 3 Recent labs: Hgb 12.2 12/8 - tsat 30% ferritin 742 iPTH 112 sensipar 30 renvela 2 ac per note - not in med list   Additional Objective Labs: Basic Metabolic Panel:  Recent Labs Lab 05/15/15 0550 05/15/15 0551 05/16/15 0950 05/17/15 0735  NA 138  --  136 136  K 5.0  --  6.4* 4.4  CL 99*  --  96* 99*  CO2 28  --  29 28  GLUCOSE 90  --  95 103*  BUN 22*  --  29* 15  CREATININE 5.51* 5.65* 7.08* 5.46*  CALCIUM 8.8*  --  8.8* 8.8*  PHOS  --   --  3.6  --    Liver Function Tests:  Recent Labs Lab 05/15/15 0550 05/16/15 0950  AST 27  --   ALT 13*  --   ALKPHOS 79  --   BILITOT 0.9  --   PROT 7.4  --   ALBUMIN 3.3* 2.9*   CBC:  Recent Labs Lab 05/14/15 2131 05/15/15 0550 05/16/15 0950 05/17/15 1007  WBC 4.5 4.6 6.0 4.3  NEUTROABS  --  2.4  --   --   HGB 12.1 12.4 12.6 12.2  HCT 38.7 38.7 38.3 38.0  MCV 89.0 88.0 86.3 87.0  PLT 90* 80* 96*  99*   Blood Culture    Component Value Date/Time   SDES BLOOD RIGHT HAND 05/14/2015 2317   SPECREQUEST BOTTLES DRAWN AEROBIC AND ANAEROBIC 5ML 05/14/2015 2317   CULT NO GROWTH 3 DAYS 05/14/2015 2317   REPTSTATUS PENDING 05/14/2015 2317    CBG:  Recent Labs Lab 05/15/15 1953 05/16/15 0812 05/16/15 1456 05/16/15 2119 05/17/15 0758  GLUCAP 129* 91 57* 120* 101*   Medications:   . amLODipine  5 mg Oral QHS  . aspirin EC  81 mg Oral Daily  . atorvastatin  20 mg Oral q1800  . carvedilol  3.125 mg Oral BID WC  . cinacalcet  30 mg Oral Q breakfast  . doxercalciferol  2 mcg Intravenous Q T,Th,Sa-HD  . enoxaparin (LOVENOX) injection  30 mg Subcutaneous Daily  . famotidine  20 mg Oral Daily  . feeding supplement (NEPRO CARB STEADY)  237 mL Oral BID BM  . insulin aspart  0-9 Units Subcutaneous TID WC  . multivitamin  1 tablet Oral QHS  . piperacillin-tazobactam (ZOSYN)  IV  2.25 g Intravenous 3 times per day  . QUEtiapine  25 mg Oral BID  . sevelamer  carbonate  1,600 mg Oral TID WC  . sodium chloride  3 mL Intravenous Q12H

## 2015-05-18 ENCOUNTER — Inpatient Hospital Stay (HOSPITAL_COMMUNITY): Payer: Medicare Other

## 2015-05-18 DIAGNOSIS — A419 Sepsis, unspecified organism: Secondary | ICD-10-CM

## 2015-05-18 LAB — GLUCOSE, CAPILLARY
GLUCOSE-CAPILLARY: 129 mg/dL — AB (ref 65–99)
GLUCOSE-CAPILLARY: 135 mg/dL — AB (ref 65–99)
Glucose-Capillary: 72 mg/dL (ref 65–99)

## 2015-05-18 LAB — RENAL FUNCTION PANEL
Albumin: 1.6 g/dL — ABNORMAL LOW (ref 3.5–5.0)
Anion gap: 11 (ref 5–15)
BUN: 15 mg/dL (ref 6–20)
CO2: 20 mmol/L — ABNORMAL LOW (ref 22–32)
Calcium: 5.6 mg/dL — CL (ref 8.9–10.3)
Chloride: 111 mmol/L (ref 101–111)
Creatinine, Ser: 5.16 mg/dL — ABNORMAL HIGH (ref 0.44–1.00)
GFR calc Af Amer: 8 mL/min — ABNORMAL LOW (ref >60)
GFR calc non Af Amer: 7 mL/min — ABNORMAL LOW (ref >60)
Glucose, Bld: 64 mg/dL — ABNORMAL LOW (ref 65–99)
Phosphorus: 6.7 mg/dL — ABNORMAL HIGH (ref 2.5–4.6)
Potassium: >7.5 mmol/L (ref 3.5–5.1)
Sodium: 142 mmol/L (ref 135–145)

## 2015-05-18 LAB — CBC
HCT: 33.4 % — ABNORMAL LOW (ref 36.0–46.0)
Hemoglobin: 10.6 g/dL — ABNORMAL LOW (ref 12.0–15.0)
MCH: 27.4 pg (ref 26.0–34.0)
MCHC: 31.7 g/dL (ref 30.0–36.0)
MCV: 86.3 fL (ref 78.0–100.0)
Platelets: 92 10*3/uL — ABNORMAL LOW (ref 150–400)
RBC: 3.87 MIL/uL (ref 3.87–5.11)
RDW: 19.2 % — ABNORMAL HIGH (ref 11.5–15.5)
WBC: 4.4 10*3/uL (ref 4.0–10.5)

## 2015-05-18 LAB — CORTISOL: Cortisol, Plasma: 12.6 ug/dL

## 2015-05-18 LAB — TSH: TSH: 2.271 u[IU]/mL (ref 0.350–4.500)

## 2015-05-18 LAB — VITAMIN B12: Vitamin B-12: 1635 pg/mL — ABNORMAL HIGH (ref 180–914)

## 2015-05-18 MED ORDER — LIDOCAINE HCL (PF) 1 % IJ SOLN: 5.0000 mL | INTRAMUSCULAR | Status: DC | PRN

## 2015-05-18 MED ORDER — HEPARIN SODIUM (PORCINE) 1000 UNIT/ML DIALYSIS: 1000.0000 [IU] | INTRAMUSCULAR | Status: DC | PRN

## 2015-05-18 MED ORDER — QUETIAPINE FUMARATE 50 MG PO TABS: 50.0000 mg | ORAL_TABLET | Freq: Two times a day (BID) | ORAL | Status: DC

## 2015-05-18 MED ORDER — ALTEPLASE 2 MG IJ SOLR
2.0000 mg | Freq: Once | INTRAMUSCULAR | Status: DC | PRN
  Filled 2015-05-18: qty 2

## 2015-05-18 MED ORDER — LIDOCAINE-PRILOCAINE 2.5-2.5 % EX CREA: 1.0000 "application " | TOPICAL_CREAM | CUTANEOUS | Status: DC | PRN

## 2015-05-18 MED ORDER — PENTAFLUOROPROP-TETRAFLUOROETH EX AERO: 1.0000 "application " | INHALATION_SPRAY | CUTANEOUS | Status: DC | PRN

## 2015-05-18 MED ORDER — SODIUM CHLORIDE 0.9 % IV SOLN: 100.0000 mL | INTRAVENOUS | Status: DC | PRN

## 2015-05-18 MED ORDER — LEVOFLOXACIN 500 MG PO TABS: 500.0000 mg | ORAL_TABLET | ORAL | Status: AC

## 2015-05-18 MED ORDER — HEPARIN SODIUM (PORCINE) 1000 UNIT/ML DIALYSIS: 20.0000 [IU]/kg | INTRAMUSCULAR | Status: DC | PRN

## 2015-05-18 NOTE — Progress Notes (Signed)
ANTIBIOTIC CONSULT NOTE  Pharmacy Consult for Zosyn  Indication: rule out sepsis  No Known Allergies  Patient Measurements: Height: 5' (152.4 cm) Weight: 126 lb 15.8 oz (57.6 kg) IBW/kg (Calculated) : 45.5  Vital Signs: Temp: 98.4 F (36.9 C) (12/17 0749) Temp Source: Oral (12/17 0749) BP: 95/56 mmHg (12/17 1130) Pulse Rate: 93 (12/17 1130) Intake/Output from previous day: 12/16 0701 - 12/17 0700 In: 365 [P.O.:315; IV Piggyback:50] Out: 0  Intake/Output from this shift:    Labs:  Recent Labs  05/16/15 0950 05/17/15 0735 05/17/15 1007 05/18/15 0811  WBC 6.0  --  4.3 4.4  HGB 12.6  --  12.2 10.6*  PLT 96*  --  99* 92*  CREATININE 7.08* 5.46*  --  5.16*   Estimated Creatinine Clearance: 6.4 mL/min (by C-G formula based on Cr of 5.16). No results for input(s): VANCOTROUGH, VANCOPEAK, VANCORANDOM, GENTTROUGH, GENTPEAK, GENTRANDOM, TOBRATROUGH, TOBRAPEAK, TOBRARND, AMIKACINPEAK, AMIKACINTROU, AMIKACIN in the last 72 hours.   Microbiology: Recent Results (from the past 720 hour(s))  Urine culture     Status: None   Collection Time: 05/14/15  9:19 PM  Result Value Ref Range Status   Specimen Description URINE, CATHETERIZED  Final   Special Requests NONE  Final   Culture 1,000 COLONIES/mL INSIGNIFICANT GROWTH  Final   Report Status 05/16/2015 FINAL  Final  Blood culture (routine x 2)     Status: None (Preliminary result)   Collection Time: 05/14/15 11:10 PM  Result Value Ref Range Status   Specimen Description BLOOD RIGHT ARM  Final   Special Requests BOTTLES DRAWN AEROBIC AND ANAEROBIC 5ML  Final   Culture NO GROWTH 3 DAYS  Final   Report Status PENDING  Incomplete  Blood culture (routine x 2)     Status: None (Preliminary result)   Collection Time: 05/14/15 11:17 PM  Result Value Ref Range Status   Specimen Description BLOOD RIGHT HAND  Final   Special Requests BOTTLES DRAWN AEROBIC AND ANAEROBIC 5ML  Final   Culture NO GROWTH 3 DAYS  Final   Report Status  PENDING  Incomplete    Assessment: 81 y.o. female with AMS, possible urosepsis, started on empiric antibiotics. WBC 4.4, afebrile. Still confused in HD this morning.  Vanc 12/14>> 12/16 Zosyn 12/14>>  12/13 WUJ:[id number]:NGTD 12/13 UCx: insignificant growth 12/14 GI pathogen panel: in process  HD on TTSat- in HD currently. Has tolerated BFR of 35150mL/min for ~3.5h  Plan:  -Zosyn 2.25 g IV q8h -follow HD tolerance/schedule, LOT, clinical progression  Leala Bryand D. Ashford Clouse, PharmD, BCPS Clinical Pharmacist Pager: [phone] 05/18/2015 11:39 AM

## 2015-05-18 NOTE — Progress Notes (Signed)
Pt's daughter and son called RN into room and expressed frustration in the care of the pt throughout her stay. They had complaints about the cleanliness of the patient's room as well as the cleanliness of the patient. Pt's son specifically stated his complaints about this RN not increasing the pt's dosage of antibiotics. I educated them on an RN's scope of practice. I also educated them about pt being on dialysis and the limitations of her kidneys. Family verbalized understanding and thanked me for giving them some clarification on why this hasn't been done. Pt's family also upset about MD's suggestion of giving pt seroquel because they "know what that's for." Pt and pt's room are clean at this time. When I asked what else I could do to improve things, son stated "that's something you have to figure out for yourself."

## 2015-05-18 NOTE — Discharge Summary (Addendum)
Physician Discharge Summary  [name] ZOX:[id number] DOB: 1929-03-03 DOA: 05/14/2015  PCP: Eustaquio Boyden, MD  Admit date: 05/14/2015 Discharge date: 05/19/2015  Time spent: 25 minutes  Recommendations for Outpatient Follow-up:  1. Discharge home on oral Levaquin 500 mg every 48 hours complete a seven-day course of antibiotic.stop date 05/22/2015   Discharge Diagnoses:  Principal Problem:   Acute metabolic encephalopathy  Active Problems:   Diarrhea   UTI (lower urinary tract infection)   Sepsis (HCC)   ESRD on dialysis [facility])   Discharge Condition: Signed out AMA  Diet recommendation: Renal  Filed Weights   05/17/15 2151 05/18/15 0749 05/18/15 1149  Weight: 57.6 kg (126 lb 15.8 oz) 57.6 kg (126 lb 15.8 oz) 55.9 kg (123 lb 3.8 oz)    History of present illness:  81 year old female with end-stage renal disease on hemodialysis (CTS) type 2 diabetes mellitus, GERD, hypertension, with multiple UTIs or to the ED after found to be increasingly confused for the past 3 days she also had increasing diarrhea for the past 2 days. No history of nausea, vomiting, shortness of breath, fevers, chills , chest pain or abdominal pain. In the ED UA was suggestive of UTI. She also had mildly elevated lactic acid low-grade temperature. She was admitted one month back for sepsis from right AV graft infection. Admitted to hospitalist service for further management of acute encephalopathy.  Hospital Course:  Acute encephalopathy Possibly secondary to underlying UTI. Also had diarrhea on presentation , now resolved. .. Cultures negative. On empiric vancomycin and Zosyn. (Narrowed to Zosyn on 12/16) Subsequent lactic acid normalized. Ammonia level and pro calcitonin normal. Head CT and chest x-ray unremarkable. -Encephalopathy persists and patient agitated periodically. Minimizing medications. Added Seroquel and goes adjusted. -Avoided benzos or narcotics. Quinine held. Discontinued Phenergan  and Lexapro.  -TSH, B12 were normal  and repeat chest x-ray unremarkable..  -family were not happy that pt wasn't getting better. They wanted to take her home on 12/17. i informed them she was not better well with her persistent encephalopathy and it will have to be an AMA discharged. Family initially agreed but later changed their mind to keep her in the hospital.  mentation is much improved this morning. Pt no longe agitated and able to carry out a conversation.  stable for d/c. May resume all home meds      Diarrhea Resolved.  ESRD on hemodialysis (TTS) Dialysis as scheduled . conitue hectoral, renvela and sesipar. Renal following.  Essential hypertension Continue Norvasc and Coreg. Prn hydralazine Continue hectoral, Renvela and Sensipar.  Type 2 diabetes mellitus Has episodic low fsg. Recommend bedtime snack.  Chronic thrombocytopenia Stable. Monitor.  Hyperkalemia Dialysis on 12/17. Follow-up as outpatient with scheduled HD.   DVT prophylaxis Diet: Renal  Code Status: Full code Family Communication: None at bedside Disposition Plan: home   Consultants:  Renal  Procedures:  Head CT  Antibiotics:  IV vancomycin 12/14-12/16  Zosyn 12/14--12/17  Discharge Exam: Filed Vitals:   05/18/15 1149 05/18/15 1313  BP: 148/70 167/66  Pulse: 91 88  Temp: 98.2 F (36.8 C) 98 F (36.7 C)  Resp: 14 16    General: Elderly female lying in bed not in distress,much oriented  HEENT: moist mucosa  Chest: Clear to auscultation bilaterally   CVS: Normal S1 and S2, no murmurs  GI: Soft, nondistended, nontender, bowel sounds present  Musculoskeletal: Warm, no edema, left groin HD catheter  CNS: alert and oriented to place and person  Discharge Instructions  Current Discharge Medication List    START taking these medications   Details  levofloxacin (LEVAQUIN) 500 MG tablet Take 1 tablet (500 mg total) by mouth every other day. Qty: 3 tablet,  Refills: 0      CONTINUE these medications which have NOT CHANGED   Details  acetaminophen (TYLENOL) 500 MG tablet Take 500 mg by mouth 2 (two) times daily as needed for mild pain.     amLODipine (NORVASC) 5 MG tablet Take 2.5-5 mg by mouth daily. Take half tablet on Sundays mondays wednesdays and fridays. Whole tablet all other days of the week    aspirin 81 MG tablet Take 81 mg by mouth daily.    atorvastatin (LIPITOR) 20 MG tablet Take 1 tablet (20 mg total) by mouth daily at 6 PM. Qty: 30 tablet, Refills: 1    carvedilol (COREG) 3.125 MG tablet Take 1 tablet (3.125 mg total) by mouth 2 (two) times daily with a meal. Qty: 60 tablet, Refills: 0    cinacalcet (SENSIPAR) 30 MG tablet Take 30 mg by mouth daily.    Dorzolamide HCl-Timolol Mal (COSOPT OP) Place 1 drop into both eyes daily.     escitalopram (LEXAPRO) 10 MG tablet Take 0.5 tablets (5 mg total) by mouth daily. Qty: 45 tablet, Refills: 1    famotidine (PEPCID) 20 MG tablet Take 1 tablet (20 mg total) by mouth 2 (two) times daily. Qty: 60 tablet, Refills: 0    folic acid-vitamin b complex-vitamin c-selenium-zinc (DIALYVITE) 3 MG TABS Take 1 tablet by mouth daily.      multivitamin (RENA-VIT) TABS tablet TAKE ONE TABLET BY MOUTH AT BEDTIME Qty: 90 tablet, Refills: 3    promethazine (PHENERGAN) 25 MG tablet Take 12.5 mg by mouth every 6 (six) hours as needed for nausea or vomiting.    quiNINE (QUALAQUIN) 324 MG capsule Take 324 mg by mouth 3 (three) times a week. Taking on Tuesday, Thursday, and Saturday per daughter    sevelamer carbonate (RENVELA) 800 MG tablet Take 2 tablets (1,600 mg total) by mouth 3 (three) times daily with meals. Qty: 180 tablet, Refills: 1       No Known Allergies Follow-up Information    Follow up with Eustaquio Boyden, MD In 1 week.   Specialty:  Family Medicine   Contact information:   [address] [place] [address] [phone]        The results of significant  diagnostics from this hospitalization (including imaging, microbiology, ancillary and laboratory) are listed below for reference.    Significant Diagnostic Studies: Ct Head Wo Contrast  05/15/2015  CLINICAL DATA:  Acute encephalopathy. EXAM: CT HEAD WITHOUT CONTRAST TECHNIQUE: Contiguous axial images were obtained from the base of the skull through the vertex without intravenous contrast. COMPARISON:  CT scan of April 08, 2015. FINDINGS: Bony calvarium appears intact. Moderate diffuse cortical atrophy is noted. Mild chronic ischemic white matter disease is noted. No mass effect or midline shift is noted. Ventricular size is within normal limits. There is no evidence of mass lesion, hemorrhage or acute infarction. IMPRESSION: Moderate diffuse cortical atrophy. Mild chronic ischemic white matter disease. No acute intracranial abnormality seen. Electronically Signed   By: Lupita Raider, M.D.   On: 05/15/2015 11:25   Dg Chest Port 1 View  05/15/2015  CLINICAL DATA:  Acute onset of shortness of breath. Sepsis. Initial encounter. EXAM: PORTABLE CHEST 1 VIEW COMPARISON:  Chest radiograph from 04/10/2015 FINDINGS: The lungs are well-aerated. Pulmonary vascularity is at  the upper limits of normal. There is no evidence of focal opacification, pleural effusion or pneumothorax. The cardiomediastinal silhouette is mildly enlarged. A right-sided vascular stent is noted. No acute osseous abnormalities are seen. Scattered clips are noted about the left axilla. IMPRESSION: Mild cardiomegaly.  Lungs remain grossly clear. Electronically Signed   By: Roanna Raider M.D.   On: 05/15/2015 05:11    Microbiology: Recent Results (from the past 240 hour(s))  Urine culture     Status: None   Collection Time: 05/14/15  9:19 PM  Result Value Ref Range Status   Specimen Description URINE, CATHETERIZED  Final   Special Requests NONE  Final   Culture 1,000 COLONIES/mL INSIGNIFICANT GROWTH  Final   Report Status 05/16/2015  FINAL  Final  Blood culture (routine x 2)     Status: None (Preliminary result)   Collection Time: 05/14/15 11:10 PM  Result Value Ref Range Status   Specimen Description BLOOD RIGHT ARM  Final   Special Requests BOTTLES DRAWN AEROBIC AND ANAEROBIC  Final   Culture NO GROWTH 4 DAYS  Final   Report Status PENDING  Incomplete  Blood culture (routine x 2)     Status: None (Preliminary result)   Collection Time: 05/14/15 11:17 PM  Result Value Ref Range Status   Specimen Description BLOOD RIGHT HAND  Final   Special Requests BOTTLES DRAWN AEROBIC AND ANAEROBIC  Final   Culture NO GROWTH 4 DAYS  Final   Report Status PENDING  Incomplete     Labs: Basic Metabolic Panel:  Recent Labs Lab 05/14/15 2131 05/15/15 0550 05/15/15 0551 05/16/15 0950 05/17/15 0735 05/18/15 0811  NA 139 138  --  136 136 142  K 4.8 5.0  --  6.4* 4.4 >7.5*  CL 98* 99*  --  96* 99* 111  CO2 31 28  --  29 28 20*  GLUCOSE 193* 90  --  95 103* 64*  BUN 21* 22*  --  29* 15 15  CREATININE 5.02* 5.51* 5.65* 7.08* 5.46* 5.16*  CALCIUM 9.1 8.8*  --  8.8* 8.8* 5.6*  PHOS  --   --   --  3.6  --  6.7*   Liver Function Tests:  Recent Labs Lab 05/15/15 0550 05/16/15 0950 05/18/15 0811  AST 27  --   --   ALT 13*  --   --   ALKPHOS 79  --   --   BILITOT 0.9  --   --   PROT 7.4  --   --   ALBUMIN 3.3* 2.9* 1.6*   No results for input(s): LIPASE, AMYLASE in the last 168 hours.  Recent Labs Lab 05/15/15 0735  AMMONIA 26   CBC:  Recent Labs Lab 05/14/15 2131 05/15/15 0550 05/16/15 0950 05/17/15 1007 05/18/15 0811  WBC 4.5 4.6 6.0 4.3 4.4  NEUTROABS  --  2.4  --   --   --   HGB 12.1 12.4 12.6 12.2 10.6*  HCT 38.7 38.7 38.3 38.0 33.4*  MCV 89.0 88.0 86.3 87.0 86.3  PLT 90* 80* 96* 99* 92*   Cardiac Enzymes: No results for input(s): CKTOTAL, CKMB, CKMBINDEX, TROPONINI in the last 168 hours. BNP: BNP (last 3 results) No results for input(s): BNP in the last 8760 hours.  ProBNP (last 3  results) No results for input(s): PROBNP in the last 8760 hours.  CBG:  Recent Labs Lab 05/17/15 1648 05/17/15 1809 05/17/15 2149 05/18/15 1258 05/18/15 1655  GLUCAP 62* 94 114*  72 129*       Signed:  Kameron Blethen  [facility] 05/18/2015, 5:14 PM

## 2015-05-18 NOTE — Progress Notes (Signed)
[facility] Progress Note  Assessment/Plan: 1. AMS- urine culture neg, BC no growth pending - still confused. Cannot recognize me by name.  [facility] No obvious source of infection; meds per primary - started on seroquel yesterday 2. ESRD - TTS - HD today; hyperkalemic Thursday 6.4 down to 4.4 Friday - specimen hemolyzed this am. with K > 7.5  Ca 5.6  Qb 320 DFR 800 - to redrawn labs on HD knowing they will be not entirely accurate; then decide on whether to get post HD labs.Projected kt/v 1.78  3. Anemia - Hgb 12 - drop to 10.6 - watch - not on ESA 4. Secondary hyperparathyroidism - Hectorol 2, sensipar renvela - P up  5. HTN/volume - controlled - refused meds last pm, prn IV hydralazine ordered.  6. Nutrition - renal diet /nepro/vit 7. Thrombocytopenia - stable- today's lab pending  Sheffield SliderMartha B Bergman, PA-C [facility] Beeper [phone] 05/18/2015,8:49 AM  LOS: 4 days   Pt seen, examined and agree w A/P as above.  Vinson Moselleob Nathan Moctezuma MD [facility] pager [phone]    cell [phone] 05/18/2015, 2:44 PM    Subjective:   Cannot say where she is  Objective Filed Vitals:   05/17/15 2151 05/18/15 0414 05/18/15 0749 05/18/15 0759  BP: 149/67 138/61 167/84 184/83  Pulse: 86 85    Temp: 98.8 F (37.1 C) 98.9 F (37.2 C) 98.4 F (36.9 C)   TempSrc: Oral Oral Oral   Resp: 16 16 21 20   Height:      Weight: 57.6 kg (126 lb 15.8 oz)  57.6 kg (126 lb 15.8 oz)   SpO2: 100% 99% 97%    Physical Exam General: disoriented, pleasant and cooperative on HD Heart: RRR Lungs: no rales Abdomen: soft NT Extremities: no sig edema Dialysis Access:  Left thigh catheter right upper HERO  [facility] -gets +/- 3.75 hr 160 350/A 1.5 2 K 2.25 Ca profile 2 right upper HERO and right leg TDC heparin 3500 Mircera 150 last 12/13 off venofer Hectorol 2 down from 3 Recent labs: Hgb 12.2 12/8 - tsat 30% ferritin 742  iPTH 112 sensipar 30 renvela 2 ac per note - not in med list   Additional Objective Labs: Basic Metabolic Panel:  Recent Labs Lab 05/16/15 0950 05/17/15 0735 05/18/15 0811  NA 136 136 142  K 6.4* 4.4 >7.5*  CL 96* 99* 111  CO2 29 28 20*  GLUCOSE 95 103* 64*  BUN 29* 15 15  CREATININE 7.08* 5.46* 5.16*  CALCIUM 8.8* 8.8* 5.6*  PHOS 3.6  --  6.7*   Liver Function Tests:  Recent Labs Lab 05/15/15 0550 05/16/15 0950 05/18/15 0811  AST 27  --   --   ALT 13*  --   --   ALKPHOS 79  --   --   BILITOT 0.9  --   --   PROT 7.4  --   --   ALBUMIN 3.3* 2.9* 1.6*   CBC:  Recent Labs Lab 05/14/15 2131 05/15/15 0550 05/16/15 0950 05/17/15 1007 05/18/15 0811  WBC 4.5 4.6 6.0 4.3 4.4  NEUTROABS  --  2.4  --   --   --   HGB 12.1 12.4 12.6 12.2 10.6*  HCT 38.7 38.7 38.3 38.0 33.4*  MCV 89.0 88.0 86.3 87.0 86.3  PLT 90* 80* 96* 99* PENDING   Blood Culture    Component Value Date/Time   SDES BLOOD  RIGHT HAND 05/14/2015 2317   SPECREQUEST BOTTLES DRAWN AEROBIC AND ANAEROBIC 05/14/2015 2317   CULT NO GROWTH 3 DAYS 05/14/2015 2317   REPTSTATUS PENDING 05/14/2015 2317   CBG:  Recent Labs Lab 05/17/15 0758 05/17/15 1151 05/17/15 1648 05/17/15 1809 05/17/15 2149  GLUCAP 101* 122* 62* 94 114*  Medications:   . amLODipine  5 mg Oral QHS  . aspirin EC  81 mg Oral Daily  . atorvastatin  20 mg Oral q1800  . carvedilol  3.125 mg Oral BID WC  . cinacalcet  30 mg Oral Q breakfast  . doxercalciferol  2 mcg Intravenous Q T,Th,Sa-HD  . enoxaparin (LOVENOX) injection  30 mg Subcutaneous Daily  . famotidine  20 mg Oral Daily  . feeding supplement (NEPRO CARB STEADY)  237 mL Oral BID BM  . insulin aspart  0-9 Units Subcutaneous TID WC  . multivitamin  1 tablet Oral QHS  . piperacillin-tazobactam (ZOSYN)  IV  2.25 g Intravenous 3 times per day  . QUEtiapine  25 mg Oral BID  . sevelamer carbonate  1,600 mg Oral TID WC  . sodium chloride  3 mL Intravenous Q12H

## 2015-05-18 NOTE — Progress Notes (Signed)
[facility] PROGRESS NOTE  [name] ZOX:[id number] DOB: [birth year] DOA: 05/14/2015 PCP: Eustaquio Boyden, MD  Narrative 81 year old female with end-stage renal disease on hemodialysis (CTS) type 2 diabetes mellitus, GERD, hypertension, with multiple UTIs or to the ED after found to be increasingly confused for the past 3 days she also had increasing diarrhea for the past 2 days. No history of nausea, vomiting, shortness of breath, fevers, chills , chest pain or abdominal pain. In the ED UA was suggestive of UTI. She also had mildly elevated lactic acid low-grade temperature. She was admitted one month back for sepsis from right AV graft infection. Admitted to hospitalist service for further management of acute encephalopathy.  Assessment/Plan: Acute encephalopathy Possibly secondary to underlying UTI. Also had diarrhea on presentation , now resolved. .. Cultures negative. On empiric vancomycin and Zosyn. (Narrowed to Zosyn on 12/16) Subsequent lactic acid normalized. Ammonia level and pro calcitonin normal. Head CT and chest x-ray unremarkable. -Encephalopathy persists and patient agitated periodically. Minimizing medications. Added Seroquel and goes adjusted. -Avoid benzos or narcotics. Quinine held. Discontinued Phenergan and Lexapro.  -We'll check TSH, B12 and repeat chest x-ray. As per daughter at bedside she has good mentation at baseline.    Diarrhea Resolved.  ESRD on hemodialysis (TTS)  Dialysis as scheduled . conitue hectoral, renvela and sesipar. Renal following.  Essential hypertension Continue Norvasc and Coreg. Prn hydralazine Continue hectoral, Renvela and Sensipar.  Type 2 diabetes mellitus Has episodic low fsg. Ordered bedtime snack. Monitor closely,  Chronic thrombocytopenia Stable. Monitor.  Hyperkalemia Monitor in am ( had HD today.)   DVT prophylaxis Diet: Renal  Code Status: Full code Family Communication: None at bedside Disposition Plan:  Continue inpatient monitoring. Home once encephalopathy resolves. .   Consultants:  Renal  Procedures:  Head CT  Antibiotics:  IV vancomycin 12/14-12/16  Zosyn 12/14--  HPI/Subjective: Seen and examined. Still very confused.  Objective: Filed Vitals:   05/18/15 1149 05/18/15 1313  BP: 148/70 167/66  Pulse: 91 88  Temp: 98.2 F (36.8 C) 98 F (36.7 C)  Resp: 14 16    Intake/Output Summary (Last 24 hours) at 05/18/15 1549 Last data filed at 05/18/15 1458  Gross per 24 hour  Intake    477 ml  Output   1400 ml  Net   -923 ml   Filed Weights   05/17/15 2151 05/18/15 0749 05/18/15 1149  Weight: 57.6 kg (126 lb 15.8 oz) 57.6 kg (126 lb 15.8 oz) 55.9 kg (123 lb 3.8 oz)    Exam:   General:  Elderly female lying in bed not in distress, confused and trying to get out of bed  HEENT: moist mucosa  Chest: Clear to auscultation bilaterally   CVS: Normal S1 and S2, no murmurs  GI: Soft, nondistended, nontender, bowel sounds present  Musculoskeletal: Warm, no edema, left groin HD catheter  CNS:  very confused and not oriented    Data Reviewed: Basic Metabolic Panel:  Recent Labs Lab 05/14/15 2131 05/15/15 0550 05/15/15 0551 05/16/15 0950 05/17/15 0735 05/18/15 0811  NA 139 138  --  136 136 142  K 4.8 5.0  --  6.4* 4.4 >7.5*  CL 98* 99*  --  96* 99* 111  CO2 31 28  --  29 28 20*  GLUCOSE 193* 90  --  95 103* 64*  BUN 21* 22*  --  29* 15 15  CREATININE 5.02* 5.51* 5.65* 7.08* 5.46* 5.16*  CALCIUM 9.1 8.8*  --  8.8* 8.8* 5.6*  PHOS  --   --   --  3.6  --  6.7*   Liver Function Tests:  Recent Labs Lab 05/15/15 0550 05/16/15 0950 05/18/15 0811  AST 27  --   --   ALT 13*  --   --   ALKPHOS 79  --   --   BILITOT 0.9  --   --   PROT 7.4  --   --   ALBUMIN 3.3* 2.9* 1.6*   No results for input(s): LIPASE, AMYLASE in the last 168 hours.  Recent Labs Lab 05/15/15 0735  AMMONIA 26   CBC:  Recent Labs Lab 05/14/15 2131 05/15/15 0550  05/16/15 0950 05/17/15 1007 05/18/15 0811  WBC 4.5 4.6 6.0 4.3 4.4  NEUTROABS  --  2.4  --   --   --   HGB 12.1 12.4 12.6 12.2 10.6*  HCT 38.7 38.7 38.3 38.0 33.4*  MCV 89.0 88.0 86.3 87.0 86.3  PLT 90* 80* 96* 99* 92*   Cardiac Enzymes: No results for input(s): CKTOTAL, CKMB, CKMBINDEX, TROPONINI in the last 168 hours. BNP (last 3 results) No results for input(s): BNP in the last 8760 hours.  ProBNP (last 3 results) No results for input(s): PROBNP in the last 8760 hours.  CBG:  Recent Labs Lab 05/17/15 1151 05/17/15 1648 05/17/15 1809 05/17/15 2149 05/18/15 1258  GLUCAP 122* 62* 94 114* 72    Recent Results (from the past 240 hour(s))  Urine culture     Status: None   Collection Time: 05/14/15  9:19 PM  Result Value Ref Range Status   Specimen Description URINE, CATHETERIZED  Final   Special Requests NONE  Final   Culture 1,000 COLONIES/mL INSIGNIFICANT GROWTH  Final   Report Status 05/16/2015 FINAL  Final  Blood culture (routine x 2)     Status: None (Preliminary result)   Collection Time: 05/14/15 11:10 PM  Result Value Ref Range Status   Specimen Description BLOOD RIGHT ARM  Final   Special Requests BOTTLES DRAWN AEROBIC AND ANAEROBIC 5ML  Final   Culture NO GROWTH 4 DAYS  Final   Report Status PENDING  Incomplete  Blood culture (routine x 2)     Status: None (Preliminary result)   Collection Time: 05/14/15 11:17 PM  Result Value Ref Range Status   Specimen Description BLOOD RIGHT HAND  Final   Special Requests BOTTLES DRAWN AEROBIC AND ANAEROBIC 5ML  Final   Culture NO GROWTH 4 DAYS  Final   Report Status PENDING  Incomplete     Studies: No results found.  Scheduled Meds: . amLODipine  5 mg Oral QHS  . aspirin EC  81 mg Oral Daily  . carvedilol  3.125 mg Oral BID WC  . cinacalcet  30 mg Oral Q breakfast  . doxercalciferol  2 mcg Intravenous Q T,Th,Sa-HD  . enoxaparin (LOVENOX) injection  30 mg Subcutaneous Daily  . famotidine  20 mg Oral Daily  .  feeding supplement (NEPRO CARB STEADY)  237 mL Oral BID BM  . insulin aspart  0-9 Units Subcutaneous TID WC  . multivitamin  1 tablet Oral QHS  . piperacillin-tazobactam (ZOSYN)  IV  2.25 g Intravenous 3 times per day  . QUEtiapine  50 mg Oral BID  . sevelamer carbonate  1,600 mg Oral TID WC  . sodium chloride  3 mL Intravenous Q12H   Continuous Infusions:    Time spent: 25 minutes    [name]  [facility] Pager [phone]. If 7PM-7AM,  please contact night-coverage at [email or web address], password [facility] 05/18/2015, 3:49 PM  LOS: 4 days

## 2015-05-19 DIAGNOSIS — G9341 Metabolic encephalopathy: Secondary | ICD-10-CM

## 2015-05-19 LAB — RENAL FUNCTION PANEL
Albumin: 2.8 g/dL — ABNORMAL LOW (ref 3.5–5.0)
Anion gap: 11 (ref 5–15)
BUN: 10 mg/dL (ref 6–20)
CO2: 26 mmol/L (ref 22–32)
Calcium: 8.7 mg/dL — ABNORMAL LOW (ref 8.9–10.3)
Chloride: 97 mmol/L — ABNORMAL LOW (ref 101–111)
Creatinine, Ser: 5.29 mg/dL — ABNORMAL HIGH (ref 0.44–1.00)
GFR calc Af Amer: 8 mL/min — ABNORMAL LOW
GFR calc non Af Amer: 7 mL/min — ABNORMAL LOW
Glucose, Bld: 99 mg/dL (ref 65–99)
Phosphorus: 3.5 mg/dL (ref 2.5–4.6)
Potassium: 3.9 mmol/L (ref 3.5–5.1)
Sodium: 134 mmol/L — ABNORMAL LOW (ref 135–145)

## 2015-05-19 LAB — CBC
HCT: 36.3 % (ref 36.0–46.0)
Hemoglobin: 11.5 g/dL — ABNORMAL LOW (ref 12.0–15.0)
MCH: 27.6 pg (ref 26.0–34.0)
MCHC: 31.7 g/dL (ref 30.0–36.0)
MCV: 87.1 fL (ref 78.0–100.0)
Platelets: 101 10*3/uL — ABNORMAL LOW (ref 150–400)
RBC: 4.17 MIL/uL (ref 3.87–5.11)
RDW: 19.8 % — ABNORMAL HIGH (ref 11.5–15.5)
WBC: 5.1 10*3/uL (ref 4.0–10.5)

## 2015-05-19 LAB — CULTURE, BLOOD (ROUTINE X 2)
CULTURE: NO GROWTH
CULTURE: NO GROWTH

## 2015-05-19 LAB — GLUCOSE, CAPILLARY: Glucose-Capillary: 96 mg/dL (ref 65–99)

## 2015-05-19 NOTE — Progress Notes (Signed)
[facility] PROGRESS NOTE  [name] MWN:[id number] DOB: 03/04/1929 DOA: 05/14/2015 PCP: Eustaquio Boyden, MD  Assessment/Plan: Acute metabolic encephalopathy  due to UTI with slow improvement. Now much better this am. No further agitation. On empiric zosyn. D/c on Levaquin to complete 7 days of abx Resume all home meds  See d/c summary for details.  D/w daughter at bedside   Consultants:  renal  Procedures:  CT head  Antibiotics:  D/c on levaquin  HPI/Subjective: Much oriented today. No agitation  Objective: Filed Vitals:   05/19/15 0635 05/19/15 0930  BP: 124/58 129/55  Pulse: 84 92  Temp: 99 F (37.2 C) 99 F (37.2 C)  Resp: 16 17    Intake/Output Summary (Last 24 hours) at 05/19/15 1037 Last data filed at 05/19/15 0931  Gross per 24 hour  Intake   1227 ml  Output   1400 ml  Net   -173 ml   Filed Weights   05/17/15 2151 05/18/15 0749 05/18/15 1149  Weight: 57.6 kg (126 lb 15.8 oz) 57.6 kg (126 lb 15.8 oz) 55.9 kg (123 lb 3.8 oz)    Exam:   General: Elderly female lying in bed not in distress,much oriented  HEENT: moist mucosa  Chest: Clear to auscultation bilaterally   CVS: Normal S1 and S2, no murmurs  GI: Soft, nondistended, nontender, bowel sounds present  Musculoskeletal: Warm, no edema, left groin HD catheter  CNS: alert and oriented to place and person  Data Reviewed: Basic Metabolic Panel:  Recent Labs Lab 05/15/15 0550 05/15/15 0551 05/16/15 0950 05/17/15 0735 05/18/15 0811 05/19/15 0601  NA 138  --  136 136 142 134*  K 5.0  --  6.4* 4.4 >7.5* 3.9  CL 99*  --  96* 99* 111 97*  CO2 28  --  29 28 20* 26  GLUCOSE 90  --  95 103* 64* 99  BUN 22*  --  29* CREATININE 5.51* 5.65* 7.08* 5.46* 5.16* 5.29*  CALCIUM 8.8*  --  8.8* 8.8* 5.6* 8.7*  PHOS  --   --  3.6  --  6.7* 3.5   Liver Function Tests:  Recent Labs Lab 05/15/15 0550 05/16/15 0950 05/18/15 0811 05/19/15 0601  AST 27  --   --   --    ALT 13*  --   --   --   ALKPHOS 79  --   --   --   BILITOT 0.9  --   --   --   PROT 7.4  --   --   --   ALBUMIN 3.3* 2.9* 1.6* 2.8*   No results for input(s): LIPASE, AMYLASE in the last 168 hours.  Recent Labs Lab 05/15/15 0735  AMMONIA 26   CBC:  Recent Labs Lab 05/15/15 0550 05/16/15 0950 05/17/15 1007 05/18/15 0811 05/19/15 0601  WBC 4.6 6.0 4.3 4.4 5.1  NEUTROABS 2.4  --   --   --   --   HGB 12.4 12.6 12.2 10.6* 11.5*  HCT 38.7 38.3 38.0 33.4* 36.3  MCV 88.0 86.3 87.0 86.3 87.1  PLT 80* 96* 99* 92* 101*   Cardiac Enzymes: No results for input(s): CKTOTAL, CKMB, CKMBINDEX, TROPONINI in the last 168 hours. BNP (last 3 results) No results for input(s): BNP in the last 8760 hours.  ProBNP (last 3 results) No results for input(s): PROBNP in the last 8760 hours.  CBG:  Recent Labs Lab 05/17/15 2149 05/18/15 1258 05/18/15 1655 05/18/15 2111 05/19/15  0805  GLUCAP 114* 72 129* 135* 96    Recent Results (from the past 240 hour(s))  Urine culture     Status: None   Collection Time: 05/14/15  9:19 PM  Result Value Ref Range Status   Specimen Description URINE, CATHETERIZED  Final   Special Requests NONE  Final   Culture 1,000 COLONIES/mL INSIGNIFICANT GROWTH  Final   Report Status 05/16/2015 FINAL  Final  Blood culture (routine x 2)     Status: None   Collection Time: 05/14/15 11:10 PM  Result Value Ref Range Status   Specimen Description BLOOD RIGHT ARM  Final   Special Requests BOTTLES DRAWN AEROBIC AND ANAEROBIC 5ML  Final   Culture NO GROWTH 5 DAYS  Final   Report Status 05/19/2015 FINAL  Final  Blood culture (routine x 2)     Status: None   Collection Time: 05/14/15 11:17 PM  Result Value Ref Range Status   Specimen Description BLOOD RIGHT HAND  Final   Special Requests BOTTLES DRAWN AEROBIC AND ANAEROBIC 5ML  Final   Culture NO GROWTH 5 DAYS  Final   Report Status 05/19/2015 FINAL  Final     Studies: Dg Chest Port 1 View  05/18/2015   CLINICAL DATA:  Acute encephalopathy. Hypertension, diabetes and GERD. EXAM: PORTABLE CHEST 1 VIEW COMPARISON:  05/15/2015 FINDINGS: There is a right chest wall dialysis catheter with tip in the right atrium. Normal heart size. Aortic atherosclerosis. No pleural effusion or edema. No airspace consolidation. IMPRESSION: 1. No acute cardiopulmonary abnormalities. 2. Aortic atherosclerosis. Electronically Signed   By: Signa Kellaylor  Stroud M.D.   On: 05/18/2015 18:00    Scheduled Meds: . amLODipine  5 mg Oral QHS  . aspirin EC  81 mg Oral Daily  . carvedilol  3.125 mg Oral BID WC  . cinacalcet  30 mg Oral Q breakfast  . doxercalciferol  2 mcg Intravenous Q T,Th,Sa-HD  . enoxaparin (LOVENOX) injection  30 mg Subcutaneous Daily  . feeding supplement (NEPRO CARB STEADY)  237 mL Oral BID BM  . insulin aspart  0-9 Units Subcutaneous TID WC  . multivitamin  1 tablet Oral QHS  . piperacillin-tazobactam (ZOSYN)  IV  2.25 g Intravenous 3 times per day  . sevelamer carbonate  1,600 mg Oral TID WC  . sodium chloride  3 mL Intravenous Q12H   Continuous Infusions:     Time spent: 20 minutes    Woodrow Drab  [facility] Pager [phone]. If 7PM-7AM, please contact night-coverage at [email or web address], password [email or web address] 05/19/2015, 10:37 AM  LOS: 5 days

## 2015-05-19 NOTE — Progress Notes (Signed)
Patient calm,resting quietly ,earlier up in chair @ nursing station .Assisted to bsc  And patient had large formed bm with blood streaks(stool brown).

## 2015-05-19 NOTE — Progress Notes (Signed)
Patient fully alert this morning and oriented x1. Repositioned in bed and set up breakfast tray in front of pt. Pt picked up utensils and began feeding herself. Is not requiring assistance at this time. Will continue to monitor.

## 2015-05-20 ENCOUNTER — Ambulatory Visit: Payer: Medicare Other | Admitting: Family Medicine

## 2015-05-20 DIAGNOSIS — I120 Hypertensive chronic kidney disease with stage 5 chronic kidney disease or end stage renal disease: Secondary | ICD-10-CM | POA: Diagnosis not present

## 2015-05-20 DIAGNOSIS — E1122 Type 2 diabetes mellitus with diabetic chronic kidney disease: Secondary | ICD-10-CM | POA: Diagnosis not present

## 2015-05-20 DIAGNOSIS — N390 Urinary tract infection, site not specified: Secondary | ICD-10-CM | POA: Diagnosis not present

## 2015-05-20 DIAGNOSIS — B9620 Unspecified Escherichia coli [E. coli] as the cause of diseases classified elsewhere: Secondary | ICD-10-CM | POA: Diagnosis not present

## 2015-05-20 DIAGNOSIS — Z48812 Encounter for surgical aftercare following surgery on the circulatory system: Secondary | ICD-10-CM | POA: Diagnosis not present

## 2015-05-20 DIAGNOSIS — T827XXA Infection and inflammatory reaction due to other cardiac and vascular devices, implants and grafts, initial encounter: Secondary | ICD-10-CM | POA: Diagnosis not present

## 2015-05-21 DIAGNOSIS — T827XXA Infection and inflammatory reaction due to other cardiac and vascular devices, implants and grafts, initial encounter: Secondary | ICD-10-CM | POA: Diagnosis not present

## 2015-05-21 DIAGNOSIS — B9620 Unspecified Escherichia coli [E. coli] as the cause of diseases classified elsewhere: Secondary | ICD-10-CM | POA: Diagnosis not present

## 2015-05-21 DIAGNOSIS — Z48812 Encounter for surgical aftercare following surgery on the circulatory system: Secondary | ICD-10-CM | POA: Diagnosis not present

## 2015-05-21 DIAGNOSIS — I120 Hypertensive chronic kidney disease with stage 5 chronic kidney disease or end stage renal disease: Secondary | ICD-10-CM | POA: Diagnosis not present

## 2015-05-21 DIAGNOSIS — N390 Urinary tract infection, site not specified: Secondary | ICD-10-CM | POA: Diagnosis not present

## 2015-05-21 DIAGNOSIS — E1122 Type 2 diabetes mellitus with diabetic chronic kidney disease: Secondary | ICD-10-CM | POA: Diagnosis not present

## 2015-05-21 LAB — GI PATHOGEN PANEL BY PCR, STOOL
Campylobacter by PCR: NOT DETECTED
Cryptosporidium by PCR: NOT DETECTED
E COLI (ETEC) LT/ST: NOT DETECTED
E COLI 0157 BY PCR: NOT DETECTED
E coli (STEC): NOT DETECTED
G LAMBLIA BY PCR: NOT DETECTED
NOROVIRUS G1/G2: NOT DETECTED
Rotavirus A by PCR: NOT DETECTED
SALMONELLA BY PCR: NOT DETECTED
SHIGELLA BY PCR: NOT DETECTED

## 2015-05-22 ENCOUNTER — Encounter: Payer: Self-pay | Admitting: Vascular Surgery

## 2015-05-22 DIAGNOSIS — D689 Coagulation defect, unspecified: Secondary | ICD-10-CM | POA: Diagnosis not present

## 2015-05-22 DIAGNOSIS — Z48812 Encounter for surgical aftercare following surgery on the circulatory system: Secondary | ICD-10-CM | POA: Diagnosis not present

## 2015-05-22 DIAGNOSIS — T827XXA Infection and inflammatory reaction due to other cardiac and vascular devices, implants and grafts, initial encounter: Secondary | ICD-10-CM | POA: Diagnosis not present

## 2015-05-22 DIAGNOSIS — D631 Anemia in chronic kidney disease: Secondary | ICD-10-CM | POA: Diagnosis not present

## 2015-05-22 DIAGNOSIS — T82818A Embolism of vascular prosthetic devices, implants and grafts, initial encounter: Secondary | ICD-10-CM | POA: Diagnosis not present

## 2015-05-22 DIAGNOSIS — N390 Urinary tract infection, site not specified: Secondary | ICD-10-CM | POA: Diagnosis not present

## 2015-05-22 DIAGNOSIS — I120 Hypertensive chronic kidney disease with stage 5 chronic kidney disease or end stage renal disease: Secondary | ICD-10-CM | POA: Diagnosis not present

## 2015-05-22 DIAGNOSIS — N2581 Secondary hyperparathyroidism of renal origin: Secondary | ICD-10-CM | POA: Diagnosis not present

## 2015-05-22 DIAGNOSIS — N186 End stage renal disease: Secondary | ICD-10-CM | POA: Diagnosis not present

## 2015-05-22 DIAGNOSIS — E1122 Type 2 diabetes mellitus with diabetic chronic kidney disease: Secondary | ICD-10-CM | POA: Diagnosis not present

## 2015-05-22 DIAGNOSIS — B9620 Unspecified Escherichia coli [E. coli] as the cause of diseases classified elsewhere: Secondary | ICD-10-CM | POA: Diagnosis not present

## 2015-05-23 DIAGNOSIS — N2581 Secondary hyperparathyroidism of renal origin: Secondary | ICD-10-CM | POA: Diagnosis not present

## 2015-05-23 DIAGNOSIS — N186 End stage renal disease: Secondary | ICD-10-CM | POA: Diagnosis not present

## 2015-05-23 DIAGNOSIS — D689 Coagulation defect, unspecified: Secondary | ICD-10-CM | POA: Diagnosis not present

## 2015-05-23 DIAGNOSIS — T82818A Embolism of vascular prosthetic devices, implants and grafts, initial encounter: Secondary | ICD-10-CM | POA: Diagnosis not present

## 2015-05-23 DIAGNOSIS — D631 Anemia in chronic kidney disease: Secondary | ICD-10-CM | POA: Diagnosis not present

## 2015-05-24 ENCOUNTER — Ambulatory Visit (INDEPENDENT_AMBULATORY_CARE_PROVIDER_SITE_OTHER): Payer: Medicare Other | Admitting: Family Medicine

## 2015-05-24 ENCOUNTER — Encounter: Payer: Self-pay | Admitting: Family Medicine

## 2015-05-24 VITALS — BP 108/58 | HR 72 | Temp 99.1°F | Wt 123.0 lb

## 2015-05-24 DIAGNOSIS — I10 Essential (primary) hypertension: Secondary | ICD-10-CM

## 2015-05-24 DIAGNOSIS — N186 End stage renal disease: Secondary | ICD-10-CM

## 2015-05-24 DIAGNOSIS — A0472 Enterocolitis due to Clostridium difficile, not specified as recurrent: Secondary | ICD-10-CM

## 2015-05-24 DIAGNOSIS — S5012XA Contusion of left forearm, initial encounter: Secondary | ICD-10-CM

## 2015-05-24 DIAGNOSIS — E785 Hyperlipidemia, unspecified: Secondary | ICD-10-CM

## 2015-05-24 DIAGNOSIS — T8249XA Other complication of vascular dialysis catheter, initial encounter: Secondary | ICD-10-CM | POA: Diagnosis not present

## 2015-05-24 DIAGNOSIS — I871 Compression of vein: Secondary | ICD-10-CM | POA: Diagnosis not present

## 2015-05-24 DIAGNOSIS — T827XXD Infection and inflammatory reaction due to other cardiac and vascular devices, implants and grafts, subsequent encounter: Secondary | ICD-10-CM | POA: Diagnosis not present

## 2015-05-24 DIAGNOSIS — A047 Enterocolitis due to Clostridium difficile: Secondary | ICD-10-CM

## 2015-05-24 DIAGNOSIS — G9340 Encephalopathy, unspecified: Secondary | ICD-10-CM

## 2015-05-24 DIAGNOSIS — Z992 Dependence on renal dialysis: Secondary | ICD-10-CM | POA: Diagnosis not present

## 2015-05-24 DIAGNOSIS — A4151 Sepsis due to Escherichia coli [E. coli]: Secondary | ICD-10-CM

## 2015-05-24 MED ORDER — METRONIDAZOLE 500 MG PO TABS: 500.0000 mg | ORAL_TABLET | Freq: Two times a day (BID) | ORAL | Status: DC

## 2015-05-24 NOTE — Progress Notes (Signed)
BP 108/58 mmHg  Pulse 72  Temp(Src) 99.1 F (37.3 C) (Oral)  Wt 123 lb (55.792 kg)  SpO2 99%   CC: hosp f/u visit  Subjective:    Patient ID: [name], female    DOB: November 03, 1928, 81 y.o.   MRN: [id number]  HPI: [name] is a 81 y.o. female presenting on 05/24/2015 for Hospitalization Follow-up   Presents with daughter and caregiver today.  Recent hospitalization with acute metabolic encephalopathy due to UTI treated with IV zosyn then levaquin 7d course. Initially also with diarrhea. C diff test returned positive. No longer foul smelling but persistent especially after meals. Finished levaquin course today. Prior hospitalization last month for R AV graft infection with infection followed by [facility]. Seen by Dr Allena Katz nephrology today and placed on doxycycline 7d course for possibly infected hematoma L forearm (after she may have hit forearm at old graft site during hospitalization). During hospitalization zocor was changed to lipitor. Pt was also started on carvedilol. Blood cultures NG final x2.  Since she's been home, denies fevers/chills, abd pain, chest pain, nausea/vomiting. Good appetite. Does not make urine. UTI sxs are confusion never dysuria or other urinary symptoms. Intermittent stool accidents. Wears depens regularly.   Normally walks with walker around her house. Wheelchair used when going out. Uses cane for dialysis. Transport chair Rx sent off last week.  Admit date: 05/14/2015 Discharge date: 05/19/2015 No f/u phone call done.  Recommendations for Outpatient Follow-up:  1. Discharge home on oral Levaquin 500 mg every 48 hours complete a seven-day course of antibiotic.stop date 05/22/2015  Discharge Diagnoses:  Principal Problem:  Acute metabolic encephalopathy  Active Problems:  Diarrhea  UTI (lower urinary tract infection)  Sepsis (HCC)  ESRD on dialysis [facility])  Discharge Condition: Signed out AMA Diet recommendation: Renal  Relevant past  medical, surgical, family and social history reviewed and updated as indicated. Interim medical history since our last visit reviewed. Allergies and medications reviewed and updated. Current Outpatient Prescriptions on File Prior to Visit  Medication Sig  . acetaminophen (TYLENOL) 500 MG tablet Take 500 mg by mouth 2 (two) times daily as needed for mild pain.   [name] amLODipine (NORVASC) 5 MG tablet Take 2.5-5 mg by mouth daily. Take half tablet on Sundays mondays wednesdays and fridays. Whole tablet all other days of the week  . aspirin 81 MG tablet Take 81 mg by mouth daily.  [name] atorvastatin (LIPITOR) 20 MG tablet Take 1 tablet (20 mg total) by mouth daily at 6 PM.  . carvedilol (COREG) 3.125 MG tablet Take 1 tablet (3.125 mg total) by mouth 2 (two) times daily with a meal.  . cinacalcet (SENSIPAR) 30 MG tablet Take 30 mg by mouth daily.  . Dorzolamide HCl-Timolol Mal (COSOPT OP) Place 1 drop into both eyes daily.   [name] escitalopram (LEXAPRO) 10 MG tablet Take 0.5 tablets (5 mg total) by mouth daily.  . famotidine (PEPCID) 20 MG tablet Take 1 tablet (20 mg total) by mouth 2 (two) times daily. (Patient taking differently: Take 20 mg by mouth daily. )  . folic acid-vitamin b complex-vitamin c-selenium-zinc (DIALYVITE) 3 MG TABS Take 1 tablet by mouth daily.    . multivitamin (RENA-VIT) TABS tablet TAKE ONE TABLET BY MOUTH AT BEDTIME  . quiNINE (QUALAQUIN) 324 MG capsule Take 324 mg by mouth 3 (three) times a week. Taking on Tuesday, Thursday, and Saturday per daughter  . sevelamer carbonate (RENVELA) 800 MG tablet Take 2 tablets (1,600 mg total)  by mouth 3 (three) times daily with meals.   No current facility-administered medications on file prior to visit.    Review of Systems Per HPI unless specifically indicated in ROS section     Objective:    BP 108/58 mmHg  Pulse 72  Temp(Src) 99.1 F (37.3 C) (Oral)  Wt 123 lb (55.792 kg)  SpO2 99%  Wt Readings from Last 3 Encounters:  05/24/15 123  lb (55.792 kg)  05/18/15 123 lb 3.8 oz (55.9 kg)  05/01/15 130 lb (58.968 kg)    Physical Exam  Constitutional: No distress.  In wheelchair  HENT:  Head: Normocephalic and atraumatic.  Mouth/Throat: Oropharynx is clear and moist. No oropharyngeal exudate.  Neck: Normal range of motion. Neck supple. No thyromegaly present.  Cardiovascular: Normal rate, regular rhythm and intact distal pulses.   Murmur heard. Pulmonary/Chest: Effort normal and breath sounds normal. No respiratory distress. She has no wheezes. She has no rales.  Musculoskeletal: She exhibits no edema.  Erythematous warm tender superficial swelling ~4cm diameter at old graft site L forearm  Lymphadenopathy:    She has no cervical adenopathy.  Skin: Skin is warm and dry. No rash noted.  Psychiatric: She has a normal mood and affect.  Nursing note and vitals reviewed.     Assessment & Plan:   Problem List Items Addressed This Visit    Traumatic hematoma of left forearm    Possibly developed after hit forearm during recent hospitalization.  Too localized and superficial for DVT, offered US to rule this out, will hold for now.  I touched base with Dr Allena KatzPatel today about this. Recently started by renal on doxy course - to cover any infection. Agree.  Daughter states similar event led to R forearm hematoma that slowly resolved on its own.      RESOLVED: Sepsis due to Escherichia coli (E. coli) (HCC)    Today pt completed levaquin course. Will resolve.      Relevant Medications   metroNIDAZOLE (FLAGYL) 500 MG tablet   RESOLVED: Infected prosthetic vascular graft (HCC)   Relevant Medications   metroNIDAZOLE (FLAGYL) 500 MG tablet   HLD (hyperlipidemia)    Discussed simvastatin vs lipitor. Continue lipitor.      Essential hypertension    Chronic, stable. Continue amlodipine and coreg.      ESRD on hemodialysis (HCC)    Continue HD. Access is graft L thigh      C. difficile diarrhea - Primary    Ongoing  diarrhea but overall improved and not foul smelling like prior to hospitalization. C diff positive. Treated with vanc/zosyn in hospital then oral levaquin course. Endorses continued diarrhea. Unsure if completely treated for c diff. Will prescribe 10d flagyl 500mg  bid course. Daughter agrees.       Relevant Medications   metroNIDAZOLE (FLAGYL) 500 MG tablet   RESOLVED: Acute encephalopathy    This has resolved.          Follow up plan: Return in about 4 weeks (around 06/21/2015), or as needed, for follow up visit.

## 2015-05-24 NOTE — Patient Instructions (Addendum)
Start yogurt. The swelling of the left forearm is likely just a hematoma. Take doxycycline by Dr Allena KatzPatel. Keep appointment next week with Dr Imogene Burnhen. Take flagyl 500mg  twice daily for 10 days. Return to see me in 1 month for follow up, sooner if needed.

## 2015-05-25 ENCOUNTER — Encounter: Payer: Self-pay | Admitting: Family Medicine

## 2015-05-25 DIAGNOSIS — N186 End stage renal disease: Secondary | ICD-10-CM | POA: Diagnosis not present

## 2015-05-25 DIAGNOSIS — D631 Anemia in chronic kidney disease: Secondary | ICD-10-CM | POA: Diagnosis not present

## 2015-05-25 DIAGNOSIS — D689 Coagulation defect, unspecified: Secondary | ICD-10-CM | POA: Diagnosis not present

## 2015-05-25 DIAGNOSIS — N2581 Secondary hyperparathyroidism of renal origin: Secondary | ICD-10-CM | POA: Diagnosis not present

## 2015-05-25 DIAGNOSIS — T827XXA Infection and inflammatory reaction due to other cardiac and vascular devices, implants and grafts, initial encounter: Secondary | ICD-10-CM

## 2015-05-25 DIAGNOSIS — S5012XA Contusion of left forearm, initial encounter: Secondary | ICD-10-CM

## 2015-05-25 DIAGNOSIS — T82818A Embolism of vascular prosthetic devices, implants and grafts, initial encounter: Secondary | ICD-10-CM | POA: Diagnosis not present

## 2015-05-25 HISTORY — DX: Contusion of left forearm, initial encounter: S50.12XA

## 2015-05-25 HISTORY — DX: Infection and inflammatory reaction due to other cardiac and vascular devices, implants and grafts, initial encounter: T82.7XXA

## 2015-05-25 NOTE — Assessment & Plan Note (Signed)
Chronic, stable. Continue amlodipine and coreg.

## 2015-05-25 NOTE — Assessment & Plan Note (Signed)
Today pt completed levaquin course. Will resolve.

## 2015-05-25 NOTE — Assessment & Plan Note (Signed)
This has resolved.

## 2015-05-25 NOTE — Assessment & Plan Note (Signed)
Ongoing diarrhea but overall improved and not foul smelling like prior to hospitalization. C diff positive. Treated with vanc/zosyn in hospital then oral levaquin course. Endorses continued diarrhea. Unsure if completely treated for c diff. Will prescribe 10d flagyl 500mg  bid course. Daughter agrees.

## 2015-05-25 NOTE — Assessment & Plan Note (Signed)
This seems to have resolved 

## 2015-05-25 NOTE — Assessment & Plan Note (Signed)
Possibly developed after hit forearm during recent hospitalization.  Too localized and superficial for DVT, offered US to rule this out, will hold for now.  I touched base with Dr Allena KatzPatel today about this. Recently started by renal on doxy course - to cover any infection. Agree.  Daughter states similar event led to R forearm hematoma that slowly resolved on its own.

## 2015-05-25 NOTE — Assessment & Plan Note (Addendum)
Continue HD. Access is graft L thigh

## 2015-05-25 NOTE — Assessment & Plan Note (Signed)
Discussed simvastatin vs lipitor. Continue lipitor.

## 2015-05-28 DIAGNOSIS — D689 Coagulation defect, unspecified: Secondary | ICD-10-CM | POA: Diagnosis not present

## 2015-05-28 DIAGNOSIS — T82818A Embolism of vascular prosthetic devices, implants and grafts, initial encounter: Secondary | ICD-10-CM | POA: Diagnosis not present

## 2015-05-28 DIAGNOSIS — N2581 Secondary hyperparathyroidism of renal origin: Secondary | ICD-10-CM | POA: Diagnosis not present

## 2015-05-28 DIAGNOSIS — D631 Anemia in chronic kidney disease: Secondary | ICD-10-CM | POA: Diagnosis not present

## 2015-05-28 DIAGNOSIS — N186 End stage renal disease: Secondary | ICD-10-CM | POA: Diagnosis not present

## 2015-05-29 DIAGNOSIS — Z48812 Encounter for surgical aftercare following surgery on the circulatory system: Secondary | ICD-10-CM | POA: Diagnosis not present

## 2015-05-29 DIAGNOSIS — B9620 Unspecified Escherichia coli [E. coli] as the cause of diseases classified elsewhere: Secondary | ICD-10-CM | POA: Diagnosis not present

## 2015-05-29 DIAGNOSIS — I120 Hypertensive chronic kidney disease with stage 5 chronic kidney disease or end stage renal disease: Secondary | ICD-10-CM | POA: Diagnosis not present

## 2015-05-29 DIAGNOSIS — T827XXA Infection and inflammatory reaction due to other cardiac and vascular devices, implants and grafts, initial encounter: Secondary | ICD-10-CM | POA: Diagnosis not present

## 2015-05-29 DIAGNOSIS — E1122 Type 2 diabetes mellitus with diabetic chronic kidney disease: Secondary | ICD-10-CM | POA: Diagnosis not present

## 2015-05-29 DIAGNOSIS — N390 Urinary tract infection, site not specified: Secondary | ICD-10-CM | POA: Diagnosis not present

## 2015-05-30 ENCOUNTER — Encounter: Payer: Self-pay | Admitting: *Deleted

## 2015-05-30 DIAGNOSIS — D631 Anemia in chronic kidney disease: Secondary | ICD-10-CM | POA: Diagnosis not present

## 2015-05-30 DIAGNOSIS — T82818A Embolism of vascular prosthetic devices, implants and grafts, initial encounter: Secondary | ICD-10-CM | POA: Diagnosis not present

## 2015-05-30 DIAGNOSIS — D689 Coagulation defect, unspecified: Secondary | ICD-10-CM | POA: Diagnosis not present

## 2015-05-30 DIAGNOSIS — N186 End stage renal disease: Secondary | ICD-10-CM | POA: Diagnosis not present

## 2015-05-30 DIAGNOSIS — N2581 Secondary hyperparathyroidism of renal origin: Secondary | ICD-10-CM | POA: Diagnosis not present

## 2015-05-31 ENCOUNTER — Ambulatory Visit: Payer: Medicare Other | Admitting: Vascular Surgery

## 2015-06-01 DIAGNOSIS — T82818A Embolism of vascular prosthetic devices, implants and grafts, initial encounter: Secondary | ICD-10-CM | POA: Diagnosis not present

## 2015-06-01 DIAGNOSIS — E1129 Type 2 diabetes mellitus with other diabetic kidney complication: Secondary | ICD-10-CM | POA: Diagnosis not present

## 2015-06-01 DIAGNOSIS — Z992 Dependence on renal dialysis: Secondary | ICD-10-CM | POA: Diagnosis not present

## 2015-06-01 DIAGNOSIS — D689 Coagulation defect, unspecified: Secondary | ICD-10-CM | POA: Diagnosis not present

## 2015-06-01 DIAGNOSIS — N186 End stage renal disease: Secondary | ICD-10-CM | POA: Diagnosis not present

## 2015-06-01 DIAGNOSIS — N2581 Secondary hyperparathyroidism of renal origin: Secondary | ICD-10-CM | POA: Diagnosis not present

## 2015-06-01 DIAGNOSIS — D631 Anemia in chronic kidney disease: Secondary | ICD-10-CM | POA: Diagnosis not present

## 2015-06-04 DIAGNOSIS — D509 Iron deficiency anemia, unspecified: Secondary | ICD-10-CM | POA: Diagnosis not present

## 2015-06-04 DIAGNOSIS — D689 Coagulation defect, unspecified: Secondary | ICD-10-CM | POA: Diagnosis not present

## 2015-06-04 DIAGNOSIS — N186 End stage renal disease: Secondary | ICD-10-CM | POA: Diagnosis not present

## 2015-06-04 DIAGNOSIS — D631 Anemia in chronic kidney disease: Secondary | ICD-10-CM | POA: Diagnosis not present

## 2015-06-05 DIAGNOSIS — T827XXA Infection and inflammatory reaction due to other cardiac and vascular devices, implants and grafts, initial encounter: Secondary | ICD-10-CM | POA: Diagnosis not present

## 2015-06-05 DIAGNOSIS — B9620 Unspecified Escherichia coli [E. coli] as the cause of diseases classified elsewhere: Secondary | ICD-10-CM | POA: Diagnosis not present

## 2015-06-05 DIAGNOSIS — N390 Urinary tract infection, site not specified: Secondary | ICD-10-CM | POA: Diagnosis not present

## 2015-06-05 DIAGNOSIS — E1122 Type 2 diabetes mellitus with diabetic chronic kidney disease: Secondary | ICD-10-CM | POA: Diagnosis not present

## 2015-06-05 DIAGNOSIS — Z48812 Encounter for surgical aftercare following surgery on the circulatory system: Secondary | ICD-10-CM | POA: Diagnosis not present

## 2015-06-05 DIAGNOSIS — I120 Hypertensive chronic kidney disease with stage 5 chronic kidney disease or end stage renal disease: Secondary | ICD-10-CM | POA: Diagnosis not present

## 2015-06-06 DIAGNOSIS — D689 Coagulation defect, unspecified: Secondary | ICD-10-CM | POA: Diagnosis not present

## 2015-06-06 DIAGNOSIS — N186 End stage renal disease: Secondary | ICD-10-CM | POA: Diagnosis not present

## 2015-06-06 DIAGNOSIS — D631 Anemia in chronic kidney disease: Secondary | ICD-10-CM | POA: Diagnosis not present

## 2015-06-06 DIAGNOSIS — D509 Iron deficiency anemia, unspecified: Secondary | ICD-10-CM | POA: Diagnosis not present

## 2015-06-08 DIAGNOSIS — D509 Iron deficiency anemia, unspecified: Secondary | ICD-10-CM | POA: Diagnosis not present

## 2015-06-08 DIAGNOSIS — D631 Anemia in chronic kidney disease: Secondary | ICD-10-CM | POA: Diagnosis not present

## 2015-06-08 DIAGNOSIS — N186 End stage renal disease: Secondary | ICD-10-CM | POA: Diagnosis not present

## 2015-06-08 DIAGNOSIS — D689 Coagulation defect, unspecified: Secondary | ICD-10-CM | POA: Diagnosis not present

## 2015-06-11 DIAGNOSIS — D631 Anemia in chronic kidney disease: Secondary | ICD-10-CM | POA: Diagnosis not present

## 2015-06-11 DIAGNOSIS — D509 Iron deficiency anemia, unspecified: Secondary | ICD-10-CM | POA: Diagnosis not present

## 2015-06-11 DIAGNOSIS — D689 Coagulation defect, unspecified: Secondary | ICD-10-CM | POA: Diagnosis not present

## 2015-06-11 DIAGNOSIS — N186 End stage renal disease: Secondary | ICD-10-CM | POA: Diagnosis not present

## 2015-06-12 DIAGNOSIS — I120 Hypertensive chronic kidney disease with stage 5 chronic kidney disease or end stage renal disease: Secondary | ICD-10-CM | POA: Diagnosis not present

## 2015-06-12 DIAGNOSIS — Z48812 Encounter for surgical aftercare following surgery on the circulatory system: Secondary | ICD-10-CM | POA: Diagnosis not present

## 2015-06-12 DIAGNOSIS — T827XXA Infection and inflammatory reaction due to other cardiac and vascular devices, implants and grafts, initial encounter: Secondary | ICD-10-CM | POA: Diagnosis not present

## 2015-06-12 DIAGNOSIS — B9620 Unspecified Escherichia coli [E. coli] as the cause of diseases classified elsewhere: Secondary | ICD-10-CM | POA: Diagnosis not present

## 2015-06-12 DIAGNOSIS — E1122 Type 2 diabetes mellitus with diabetic chronic kidney disease: Secondary | ICD-10-CM | POA: Diagnosis not present

## 2015-06-12 DIAGNOSIS — N390 Urinary tract infection, site not specified: Secondary | ICD-10-CM | POA: Diagnosis not present

## 2015-06-13 DIAGNOSIS — D631 Anemia in chronic kidney disease: Secondary | ICD-10-CM | POA: Diagnosis not present

## 2015-06-13 DIAGNOSIS — D689 Coagulation defect, unspecified: Secondary | ICD-10-CM | POA: Diagnosis not present

## 2015-06-13 DIAGNOSIS — D509 Iron deficiency anemia, unspecified: Secondary | ICD-10-CM | POA: Diagnosis not present

## 2015-06-13 DIAGNOSIS — N186 End stage renal disease: Secondary | ICD-10-CM | POA: Diagnosis not present

## 2015-06-14 ENCOUNTER — Encounter: Payer: Self-pay | Admitting: Vascular Surgery

## 2015-06-15 DIAGNOSIS — D689 Coagulation defect, unspecified: Secondary | ICD-10-CM | POA: Diagnosis not present

## 2015-06-15 DIAGNOSIS — D509 Iron deficiency anemia, unspecified: Secondary | ICD-10-CM | POA: Diagnosis not present

## 2015-06-15 DIAGNOSIS — N186 End stage renal disease: Secondary | ICD-10-CM | POA: Diagnosis not present

## 2015-06-15 DIAGNOSIS — D631 Anemia in chronic kidney disease: Secondary | ICD-10-CM | POA: Diagnosis not present

## 2015-06-16 DIAGNOSIS — F419 Anxiety disorder, unspecified: Secondary | ICD-10-CM | POA: Diagnosis not present

## 2015-06-16 DIAGNOSIS — I120 Hypertensive chronic kidney disease with stage 5 chronic kidney disease or end stage renal disease: Secondary | ICD-10-CM | POA: Diagnosis not present

## 2015-06-16 DIAGNOSIS — N390 Urinary tract infection, site not specified: Secondary | ICD-10-CM | POA: Diagnosis not present

## 2015-06-16 DIAGNOSIS — Z9181 History of falling: Secondary | ICD-10-CM | POA: Diagnosis not present

## 2015-06-16 DIAGNOSIS — F329 Major depressive disorder, single episode, unspecified: Secondary | ICD-10-CM | POA: Diagnosis not present

## 2015-06-16 DIAGNOSIS — M1991 Primary osteoarthritis, unspecified site: Secondary | ICD-10-CM | POA: Diagnosis not present

## 2015-06-16 DIAGNOSIS — Z792 Long term (current) use of antibiotics: Secondary | ICD-10-CM | POA: Diagnosis not present

## 2015-06-16 DIAGNOSIS — T827XXD Infection and inflammatory reaction due to other cardiac and vascular devices, implants and grafts, subsequent encounter: Secondary | ICD-10-CM | POA: Diagnosis not present

## 2015-06-16 DIAGNOSIS — D509 Iron deficiency anemia, unspecified: Secondary | ICD-10-CM | POA: Diagnosis not present

## 2015-06-16 DIAGNOSIS — E785 Hyperlipidemia, unspecified: Secondary | ICD-10-CM | POA: Diagnosis not present

## 2015-06-16 DIAGNOSIS — N186 End stage renal disease: Secondary | ICD-10-CM | POA: Diagnosis not present

## 2015-06-16 DIAGNOSIS — H547 Unspecified visual loss: Secondary | ICD-10-CM | POA: Diagnosis not present

## 2015-06-16 DIAGNOSIS — E1122 Type 2 diabetes mellitus with diabetic chronic kidney disease: Secondary | ICD-10-CM | POA: Diagnosis not present

## 2015-06-16 DIAGNOSIS — Z992 Dependence on renal dialysis: Secondary | ICD-10-CM | POA: Diagnosis not present

## 2015-06-16 DIAGNOSIS — K219 Gastro-esophageal reflux disease without esophagitis: Secondary | ICD-10-CM | POA: Diagnosis not present

## 2015-06-16 DIAGNOSIS — I951 Orthostatic hypotension: Secondary | ICD-10-CM | POA: Diagnosis not present

## 2015-06-17 DIAGNOSIS — N390 Urinary tract infection, site not specified: Secondary | ICD-10-CM | POA: Diagnosis not present

## 2015-06-17 DIAGNOSIS — I120 Hypertensive chronic kidney disease with stage 5 chronic kidney disease or end stage renal disease: Secondary | ICD-10-CM | POA: Diagnosis not present

## 2015-06-17 DIAGNOSIS — T827XXD Infection and inflammatory reaction due to other cardiac and vascular devices, implants and grafts, subsequent encounter: Secondary | ICD-10-CM | POA: Diagnosis not present

## 2015-06-17 DIAGNOSIS — I951 Orthostatic hypotension: Secondary | ICD-10-CM | POA: Diagnosis not present

## 2015-06-17 DIAGNOSIS — E1122 Type 2 diabetes mellitus with diabetic chronic kidney disease: Secondary | ICD-10-CM | POA: Diagnosis not present

## 2015-06-17 DIAGNOSIS — N186 End stage renal disease: Secondary | ICD-10-CM | POA: Diagnosis not present

## 2015-06-18 ENCOUNTER — Telehealth: Payer: Self-pay | Admitting: *Deleted

## 2015-06-18 DIAGNOSIS — N186 End stage renal disease: Secondary | ICD-10-CM | POA: Diagnosis not present

## 2015-06-18 DIAGNOSIS — D689 Coagulation defect, unspecified: Secondary | ICD-10-CM | POA: Diagnosis not present

## 2015-06-18 DIAGNOSIS — D509 Iron deficiency anemia, unspecified: Secondary | ICD-10-CM | POA: Diagnosis not present

## 2015-06-18 DIAGNOSIS — D631 Anemia in chronic kidney disease: Secondary | ICD-10-CM | POA: Diagnosis not present

## 2015-06-18 NOTE — Telephone Encounter (Signed)
Patient's daughter, [name], called to give an update.  She is not sure that the UTI has cleared.  [name] is still experiencing some confusion.  The home health nurse suggested rechecking a UA to rule out UTI versus dementia.  Please advise.  [name] can be reached at [phone].    Please call orders to [facility] at [phone] or fax [phone].

## 2015-06-18 NOTE — Telephone Encounter (Signed)
Can we check to see if [facility] RN can do cath'ed UA with micro? If so please do this and send for culture if abnormal.

## 2015-06-18 NOTE — Telephone Encounter (Signed)
Left detailed message on voicemail of Dedra Skeens (daughter).  Called [facility] (phone # below is incorrect, it should read [phone]).  Left detailed message with evening receptionist (female).  Receptionist states someone will call back in the a.m.

## 2015-06-19 DIAGNOSIS — E1122 Type 2 diabetes mellitus with diabetic chronic kidney disease: Secondary | ICD-10-CM | POA: Diagnosis not present

## 2015-06-19 DIAGNOSIS — I951 Orthostatic hypotension: Secondary | ICD-10-CM | POA: Diagnosis not present

## 2015-06-19 DIAGNOSIS — N186 End stage renal disease: Secondary | ICD-10-CM | POA: Diagnosis not present

## 2015-06-19 DIAGNOSIS — I120 Hypertensive chronic kidney disease with stage 5 chronic kidney disease or end stage renal disease: Secondary | ICD-10-CM | POA: Diagnosis not present

## 2015-06-19 DIAGNOSIS — T827XXD Infection and inflammatory reaction due to other cardiac and vascular devices, implants and grafts, subsequent encounter: Secondary | ICD-10-CM | POA: Diagnosis not present

## 2015-06-19 DIAGNOSIS — N390 Urinary tract infection, site not specified: Secondary | ICD-10-CM | POA: Diagnosis not present

## 2015-06-20 DIAGNOSIS — N186 End stage renal disease: Secondary | ICD-10-CM | POA: Diagnosis not present

## 2015-06-20 DIAGNOSIS — D509 Iron deficiency anemia, unspecified: Secondary | ICD-10-CM | POA: Diagnosis not present

## 2015-06-20 DIAGNOSIS — D689 Coagulation defect, unspecified: Secondary | ICD-10-CM | POA: Diagnosis not present

## 2015-06-20 DIAGNOSIS — D631 Anemia in chronic kidney disease: Secondary | ICD-10-CM | POA: Diagnosis not present

## 2015-06-21 ENCOUNTER — Ambulatory Visit (INDEPENDENT_AMBULATORY_CARE_PROVIDER_SITE_OTHER): Payer: Medicare Other | Admitting: Vascular Surgery

## 2015-06-21 ENCOUNTER — Encounter: Payer: Self-pay | Admitting: Vascular Surgery

## 2015-06-21 VITALS — BP 114/54 | HR 76 | Temp 99.0°F | Resp 14 | Ht 63.0 in | Wt 123.0 lb

## 2015-06-21 DIAGNOSIS — N186 End stage renal disease: Secondary | ICD-10-CM

## 2015-06-21 DIAGNOSIS — Z992 Dependence on renal dialysis: Secondary | ICD-10-CM

## 2015-06-21 NOTE — Progress Notes (Signed)
    Postoperative Access Visit   History of Present Illness  [name] is a 82 y.o. year old female who presents for postoperative follow-up for: excision of infected RUA AVG segment (Date: 04/10/15).  The patient's wounds are healed.  The patient notes no steal symptoms.  The patient is able to complete their activities of daily living.  The patient's current symptoms are: none.  She continues to dialysis via HeRO graft-cath in the right arm  For VQI Use Only  PRE-ADM LIVING: Home  AMB STATUS: Ambulatory  Physical Examination Filed Vitals:   06/21/15 1130  BP: 114/54  Pulse: 76  Temp: 99 F (37.2 C)  Resp: 14    RUE: Incision is healed, skin feels warm, hand grip is 5/5, sensation in digits is intact, palpable thrill, bruit can be auscultated   Medical Decision Making  [name] Route is a 82 y.o. year old female who presents s/p Ecision of infected RUA AVG segment .  The patient's access is ready for use.  The patient's tunneled dialysis catheter can be removed after two successful cannulations and completed dialysis treatments.  Pt's family requests that her vascular care be transferred to this practice to avoid having to drive to [place].  Thank you for allowing [place] to participate in this patient's care.  Leonides Sake, MD [facility] Office: [phone] Pager: [phone]  06/21/2015, 12:52 PM

## 2015-06-22 DIAGNOSIS — D631 Anemia in chronic kidney disease: Secondary | ICD-10-CM | POA: Diagnosis not present

## 2015-06-22 DIAGNOSIS — N186 End stage renal disease: Secondary | ICD-10-CM | POA: Diagnosis not present

## 2015-06-22 DIAGNOSIS — D509 Iron deficiency anemia, unspecified: Secondary | ICD-10-CM | POA: Diagnosis not present

## 2015-06-22 DIAGNOSIS — D689 Coagulation defect, unspecified: Secondary | ICD-10-CM | POA: Diagnosis not present

## 2015-06-25 DIAGNOSIS — D631 Anemia in chronic kidney disease: Secondary | ICD-10-CM | POA: Diagnosis not present

## 2015-06-25 DIAGNOSIS — D689 Coagulation defect, unspecified: Secondary | ICD-10-CM | POA: Diagnosis not present

## 2015-06-25 DIAGNOSIS — D509 Iron deficiency anemia, unspecified: Secondary | ICD-10-CM | POA: Diagnosis not present

## 2015-06-25 DIAGNOSIS — N186 End stage renal disease: Secondary | ICD-10-CM | POA: Diagnosis not present

## 2015-06-26 DIAGNOSIS — N390 Urinary tract infection, site not specified: Secondary | ICD-10-CM | POA: Diagnosis not present

## 2015-06-26 DIAGNOSIS — T827XXD Infection and inflammatory reaction due to other cardiac and vascular devices, implants and grafts, subsequent encounter: Secondary | ICD-10-CM | POA: Diagnosis not present

## 2015-06-26 DIAGNOSIS — I951 Orthostatic hypotension: Secondary | ICD-10-CM | POA: Diagnosis not present

## 2015-06-26 DIAGNOSIS — I120 Hypertensive chronic kidney disease with stage 5 chronic kidney disease or end stage renal disease: Secondary | ICD-10-CM | POA: Diagnosis not present

## 2015-06-26 DIAGNOSIS — N186 End stage renal disease: Secondary | ICD-10-CM | POA: Diagnosis not present

## 2015-06-26 DIAGNOSIS — E1122 Type 2 diabetes mellitus with diabetic chronic kidney disease: Secondary | ICD-10-CM | POA: Diagnosis not present

## 2015-06-27 DIAGNOSIS — D509 Iron deficiency anemia, unspecified: Secondary | ICD-10-CM | POA: Diagnosis not present

## 2015-06-27 DIAGNOSIS — D631 Anemia in chronic kidney disease: Secondary | ICD-10-CM | POA: Diagnosis not present

## 2015-06-27 DIAGNOSIS — N186 End stage renal disease: Secondary | ICD-10-CM | POA: Diagnosis not present

## 2015-06-27 DIAGNOSIS — D689 Coagulation defect, unspecified: Secondary | ICD-10-CM | POA: Diagnosis not present

## 2015-06-27 NOTE — Telephone Encounter (Addendum)
plz notify daughter - Urine culture returned showing contamination, no true infection.  Confusion not due to UTI. How are symptoms?

## 2015-06-27 NOTE — Telephone Encounter (Signed)
Message left for [name] to return my call.

## 2015-06-29 DIAGNOSIS — D689 Coagulation defect, unspecified: Secondary | ICD-10-CM | POA: Diagnosis not present

## 2015-06-29 DIAGNOSIS — D509 Iron deficiency anemia, unspecified: Secondary | ICD-10-CM | POA: Diagnosis not present

## 2015-06-29 DIAGNOSIS — D631 Anemia in chronic kidney disease: Secondary | ICD-10-CM | POA: Diagnosis not present

## 2015-06-29 DIAGNOSIS — N186 End stage renal disease: Secondary | ICD-10-CM | POA: Diagnosis not present

## 2015-07-02 DIAGNOSIS — Z992 Dependence on renal dialysis: Secondary | ICD-10-CM | POA: Diagnosis not present

## 2015-07-02 DIAGNOSIS — N186 End stage renal disease: Secondary | ICD-10-CM | POA: Diagnosis not present

## 2015-07-02 DIAGNOSIS — D689 Coagulation defect, unspecified: Secondary | ICD-10-CM | POA: Diagnosis not present

## 2015-07-02 DIAGNOSIS — D631 Anemia in chronic kidney disease: Secondary | ICD-10-CM | POA: Diagnosis not present

## 2015-07-02 DIAGNOSIS — E1129 Type 2 diabetes mellitus with other diabetic kidney complication: Secondary | ICD-10-CM | POA: Diagnosis not present

## 2015-07-02 DIAGNOSIS — D509 Iron deficiency anemia, unspecified: Secondary | ICD-10-CM | POA: Diagnosis not present

## 2015-07-03 DIAGNOSIS — N186 End stage renal disease: Secondary | ICD-10-CM | POA: Diagnosis not present

## 2015-07-03 DIAGNOSIS — I951 Orthostatic hypotension: Secondary | ICD-10-CM | POA: Diagnosis not present

## 2015-07-03 DIAGNOSIS — T827XXD Infection and inflammatory reaction due to other cardiac and vascular devices, implants and grafts, subsequent encounter: Secondary | ICD-10-CM | POA: Diagnosis not present

## 2015-07-03 DIAGNOSIS — I120 Hypertensive chronic kidney disease with stage 5 chronic kidney disease or end stage renal disease: Secondary | ICD-10-CM | POA: Diagnosis not present

## 2015-07-03 DIAGNOSIS — N390 Urinary tract infection, site not specified: Secondary | ICD-10-CM | POA: Diagnosis not present

## 2015-07-03 DIAGNOSIS — E1122 Type 2 diabetes mellitus with diabetic chronic kidney disease: Secondary | ICD-10-CM | POA: Diagnosis not present

## 2015-07-03 NOTE — Telephone Encounter (Signed)
Patient's daughter notified. She said things have improved but she is becoming more forgetful. Even the patient has noticed. She has a physical scheduled for this month and will discuss this further then.

## 2015-07-03 NOTE — Telephone Encounter (Signed)
Message left for [name] to return my call.  

## 2015-07-04 DIAGNOSIS — D631 Anemia in chronic kidney disease: Secondary | ICD-10-CM | POA: Diagnosis not present

## 2015-07-04 DIAGNOSIS — N186 End stage renal disease: Secondary | ICD-10-CM | POA: Diagnosis not present

## 2015-07-06 DIAGNOSIS — D631 Anemia in chronic kidney disease: Secondary | ICD-10-CM | POA: Diagnosis not present

## 2015-07-06 DIAGNOSIS — N186 End stage renal disease: Secondary | ICD-10-CM | POA: Diagnosis not present

## 2015-07-09 DIAGNOSIS — N186 End stage renal disease: Secondary | ICD-10-CM | POA: Diagnosis not present

## 2015-07-09 DIAGNOSIS — N390 Urinary tract infection, site not specified: Secondary | ICD-10-CM | POA: Diagnosis not present

## 2015-07-09 DIAGNOSIS — E1122 Type 2 diabetes mellitus with diabetic chronic kidney disease: Secondary | ICD-10-CM

## 2015-07-09 DIAGNOSIS — T827XXD Infection and inflammatory reaction due to other cardiac and vascular devices, implants and grafts, subsequent encounter: Secondary | ICD-10-CM | POA: Diagnosis not present

## 2015-07-09 DIAGNOSIS — D631 Anemia in chronic kidney disease: Secondary | ICD-10-CM | POA: Diagnosis not present

## 2015-07-09 DIAGNOSIS — I120 Hypertensive chronic kidney disease with stage 5 chronic kidney disease or end stage renal disease: Secondary | ICD-10-CM

## 2015-07-10 ENCOUNTER — Encounter: Payer: Self-pay | Admitting: *Deleted

## 2015-07-10 DIAGNOSIS — E1122 Type 2 diabetes mellitus with diabetic chronic kidney disease: Secondary | ICD-10-CM | POA: Diagnosis not present

## 2015-07-10 DIAGNOSIS — T827XXD Infection and inflammatory reaction due to other cardiac and vascular devices, implants and grafts, subsequent encounter: Secondary | ICD-10-CM | POA: Diagnosis not present

## 2015-07-10 DIAGNOSIS — I120 Hypertensive chronic kidney disease with stage 5 chronic kidney disease or end stage renal disease: Secondary | ICD-10-CM | POA: Diagnosis not present

## 2015-07-10 DIAGNOSIS — Z452 Encounter for adjustment and management of vascular access device: Secondary | ICD-10-CM | POA: Diagnosis not present

## 2015-07-10 DIAGNOSIS — I951 Orthostatic hypotension: Secondary | ICD-10-CM | POA: Diagnosis not present

## 2015-07-10 DIAGNOSIS — N186 End stage renal disease: Secondary | ICD-10-CM | POA: Diagnosis not present

## 2015-07-10 DIAGNOSIS — N390 Urinary tract infection, site not specified: Secondary | ICD-10-CM | POA: Diagnosis not present

## 2015-07-11 DIAGNOSIS — D631 Anemia in chronic kidney disease: Secondary | ICD-10-CM | POA: Diagnosis not present

## 2015-07-11 DIAGNOSIS — N186 End stage renal disease: Secondary | ICD-10-CM | POA: Diagnosis not present

## 2015-07-13 DIAGNOSIS — N186 End stage renal disease: Secondary | ICD-10-CM | POA: Diagnosis not present

## 2015-07-13 DIAGNOSIS — D631 Anemia in chronic kidney disease: Secondary | ICD-10-CM | POA: Diagnosis not present

## 2015-07-16 ENCOUNTER — Telehealth: Payer: Self-pay | Admitting: Family Medicine

## 2015-07-16 DIAGNOSIS — D631 Anemia in chronic kidney disease: Secondary | ICD-10-CM | POA: Diagnosis not present

## 2015-07-16 DIAGNOSIS — N186 End stage renal disease: Secondary | ICD-10-CM | POA: Diagnosis not present

## 2015-07-16 NOTE — Telephone Encounter (Signed)
Lawson Fiscal called to see if you received a plan of care for pt   She faxed this 05/07/15 refaxed on 07/03/15

## 2015-07-16 NOTE — Telephone Encounter (Signed)
Spoke with Lawson Fiscal and advised POC faxed on 07/08/15. She advised it was never received. Re-faxed.

## 2015-07-17 DIAGNOSIS — N390 Urinary tract infection, site not specified: Secondary | ICD-10-CM | POA: Diagnosis not present

## 2015-07-17 DIAGNOSIS — T827XXD Infection and inflammatory reaction due to other cardiac and vascular devices, implants and grafts, subsequent encounter: Secondary | ICD-10-CM | POA: Diagnosis not present

## 2015-07-17 DIAGNOSIS — N186 End stage renal disease: Secondary | ICD-10-CM | POA: Diagnosis not present

## 2015-07-17 DIAGNOSIS — I120 Hypertensive chronic kidney disease with stage 5 chronic kidney disease or end stage renal disease: Secondary | ICD-10-CM | POA: Diagnosis not present

## 2015-07-17 DIAGNOSIS — I951 Orthostatic hypotension: Secondary | ICD-10-CM | POA: Diagnosis not present

## 2015-07-17 DIAGNOSIS — E1122 Type 2 diabetes mellitus with diabetic chronic kidney disease: Secondary | ICD-10-CM | POA: Diagnosis not present

## 2015-07-18 DIAGNOSIS — D631 Anemia in chronic kidney disease: Secondary | ICD-10-CM | POA: Diagnosis not present

## 2015-07-18 DIAGNOSIS — N186 End stage renal disease: Secondary | ICD-10-CM | POA: Diagnosis not present

## 2015-07-19 ENCOUNTER — Other Ambulatory Visit (INDEPENDENT_AMBULATORY_CARE_PROVIDER_SITE_OTHER): Payer: Medicare Other

## 2015-07-19 ENCOUNTER — Telehealth: Payer: Self-pay | Admitting: *Deleted

## 2015-07-19 ENCOUNTER — Other Ambulatory Visit: Payer: Self-pay | Admitting: Family Medicine

## 2015-07-19 DIAGNOSIS — E785 Hyperlipidemia, unspecified: Secondary | ICD-10-CM | POA: Diagnosis not present

## 2015-07-19 DIAGNOSIS — N186 End stage renal disease: Secondary | ICD-10-CM

## 2015-07-19 DIAGNOSIS — I10 Essential (primary) hypertension: Secondary | ICD-10-CM

## 2015-07-19 DIAGNOSIS — E1121 Type 2 diabetes mellitus with diabetic nephropathy: Secondary | ICD-10-CM

## 2015-07-19 DIAGNOSIS — D509 Iron deficiency anemia, unspecified: Secondary | ICD-10-CM

## 2015-07-19 DIAGNOSIS — Z992 Dependence on renal dialysis: Secondary | ICD-10-CM

## 2015-07-19 LAB — RENAL FUNCTION PANEL
ALBUMIN: 3.9 g/dL (ref 3.5–5.2)
BUN: 29 mg/dL — AB (ref 6–23)
CHLORIDE: 95 meq/L — AB (ref 96–112)
CO2: 33 meq/L — AB (ref 19–32)
Calcium: 9.4 mg/dL (ref 8.4–10.5)
Creatinine, Ser: 4.8 mg/dL (ref 0.40–1.20)
GFR: 11.09 mL/min — CL (ref >60.00)
Glucose, Bld: 108 mg/dL — ABNORMAL HIGH (ref 70–99)
POTASSIUM: 4.9 meq/L (ref 3.5–5.1)
Phosphorus: 3.1 mg/dL (ref 2.3–4.6)
SODIUM: 138 meq/L (ref 135–145)

## 2015-07-19 LAB — LIPID PANEL
CHOL/HDL RATIO: 3
CHOLESTEROL: 200 mg/dL (ref 0–200)
HDL: 69.4 mg/dL (ref >39.00)
LDL CALC: 115 mg/dL — AB (ref 0–99)
NonHDL: 130.46
Triglycerides: 79 mg/dL (ref 0.0–149.0)
VLDL: 15.8 mg/dL (ref 0.0–40.0)

## 2015-07-19 LAB — CBC WITH DIFFERENTIAL/PLATELET
BASOS PCT: 0.2 % (ref 0.0–3.0)
Basophils Absolute: 0 10*3/uL (ref 0.0–0.1)
EOS ABS: 0.1 10*3/uL (ref 0.0–0.7)
EOS PCT: 3 % (ref 0.0–5.0)
HEMATOCRIT: 39.1 % (ref 36.0–46.0)
Hemoglobin: 12.6 g/dL (ref 12.0–15.0)
LYMPHS ABS: 1.4 10*3/uL (ref 0.7–4.0)
Lymphocytes Relative: 30 % (ref 12.0–46.0)
MCHC: 32.2 g/dL (ref 30.0–36.0)
MCV: 88.7 fl (ref 78.0–100.0)
MONO ABS: 0.3 10*3/uL (ref 0.1–1.0)
Monocytes Relative: 7 % (ref 3.0–12.0)
Neutro Abs: 2.7 10*3/uL (ref 1.4–7.7)
Neutrophils Relative %: 59.8 % (ref 43.0–77.0)
PLATELETS: 116 10*3/uL — AB (ref 150.0–400.0)
RBC: 4.41 Mil/uL (ref 3.87–5.11)
RDW: 23.1 % — ABNORMAL HIGH (ref 11.5–15.5)
WBC: 4.6 10*3/uL (ref 4.0–10.5)

## 2015-07-19 LAB — HEMOGLOBIN A1C: HEMOGLOBIN A1C: 5.2 % (ref 4.6–6.5)

## 2015-07-19 NOTE — Telephone Encounter (Signed)
Known ESRD 

## 2015-07-19 NOTE — Telephone Encounter (Signed)
Hope from [facility] called: Critical results > Creatinine 4.80; GFR 11.08>>>>Dr. G notified.

## 2015-07-20 DIAGNOSIS — D631 Anemia in chronic kidney disease: Secondary | ICD-10-CM | POA: Diagnosis not present

## 2015-07-20 DIAGNOSIS — N186 End stage renal disease: Secondary | ICD-10-CM | POA: Diagnosis not present

## 2015-07-22 LAB — PARATHYROID HORMONE, INTACT (NO CA): PTH: 264 pg/mL — ABNORMAL HIGH (ref 14–64)

## 2015-07-23 DIAGNOSIS — D631 Anemia in chronic kidney disease: Secondary | ICD-10-CM | POA: Diagnosis not present

## 2015-07-23 DIAGNOSIS — N186 End stage renal disease: Secondary | ICD-10-CM | POA: Diagnosis not present

## 2015-07-24 ENCOUNTER — Ambulatory Visit (INDEPENDENT_AMBULATORY_CARE_PROVIDER_SITE_OTHER): Payer: Medicare Other | Admitting: Family Medicine

## 2015-07-24 ENCOUNTER — Encounter: Payer: Self-pay | Admitting: Family Medicine

## 2015-07-24 VITALS — BP 150/52 | HR 73 | Temp 98.4°F | Ht 63.0 in | Wt 124.2 lb

## 2015-07-24 DIAGNOSIS — R413 Other amnesia: Secondary | ICD-10-CM

## 2015-07-24 DIAGNOSIS — R197 Diarrhea, unspecified: Secondary | ICD-10-CM | POA: Insufficient documentation

## 2015-07-24 DIAGNOSIS — H9190 Unspecified hearing loss, unspecified ear: Secondary | ICD-10-CM | POA: Insufficient documentation

## 2015-07-24 DIAGNOSIS — Z7189 Other specified counseling: Secondary | ICD-10-CM | POA: Insufficient documentation

## 2015-07-24 DIAGNOSIS — Z992 Dependence on renal dialysis: Secondary | ICD-10-CM

## 2015-07-24 DIAGNOSIS — E785 Hyperlipidemia, unspecified: Secondary | ICD-10-CM

## 2015-07-24 DIAGNOSIS — D696 Thrombocytopenia, unspecified: Secondary | ICD-10-CM

## 2015-07-24 DIAGNOSIS — Z0000 Encounter for general adult medical examination without abnormal findings: Secondary | ICD-10-CM | POA: Diagnosis not present

## 2015-07-24 DIAGNOSIS — N186 End stage renal disease: Secondary | ICD-10-CM | POA: Diagnosis not present

## 2015-07-24 DIAGNOSIS — I10 Essential (primary) hypertension: Secondary | ICD-10-CM

## 2015-07-24 DIAGNOSIS — Z8639 Personal history of other endocrine, nutritional and metabolic disease: Secondary | ICD-10-CM

## 2015-07-24 DIAGNOSIS — H9193 Unspecified hearing loss, bilateral: Secondary | ICD-10-CM

## 2015-07-24 NOTE — Assessment & Plan Note (Signed)
Advanced directive discussion - discussed. Does not have set up. Would want daughter and husband to be HCPOA. Handout provided today.

## 2015-07-24 NOTE — Assessment & Plan Note (Signed)
Cerumen impaction bilaterally - rec mineral oil. Discussed hearing aid use.

## 2015-07-24 NOTE — Patient Instructions (Addendum)
Could consider medicine like aricept for memory - main side effect to monitor would be nausea.  Pass by lab to pick up stool test for C diff - if recurrent diarrhea, return. Let us know if not improving over next 1-2 days. Good to see you today - call us with quesitons. Return in 6 months for follow up visit.  Health Maintenance, Female Adopting a healthy lifestyle and getting preventive care can go a long way to promote health and wellness. Talk with your health care provider about what schedule of regular examinations is right for you. This is a good chance for you to check in with your provider about disease prevention and staying healthy. In between checkups, there are plenty of things you can do on your own. Experts have done a lot of research about which lifestyle changes and preventive measures are most likely to keep you healthy. Ask your health care provider for more information. WEIGHT AND DIET  Eat a healthy diet  Be sure to include plenty of vegetables, fruits, low-fat dairy products, and lean protein.  Do not eat a lot of foods high in solid fats, added sugars, or salt.  Get regular exercise. This is one of the most important things you can do for your health.  Most adults should exercise for at least 150 minutes each week. The exercise should increase your heart rate and make you sweat (moderate-intensity exercise).  Most adults should also do strengthening exercises at least twice a week. This is in addition to the moderate-intensity exercise.  Maintain a healthy weight  Body mass index (BMI) is a measurement that can be used to identify possible weight problems. It estimates body fat based on height and weight. Your health care provider can help determine your BMI and help you achieve or maintain a healthy weight.  For females 26 years of age and older:   A BMI below 18.5 is considered underweight.  A BMI of 18.5 to 24.9 is normal.  A BMI of 25 to 29.9 is considered  overweight.  A BMI of 30 and above is considered obese.  Watch levels of cholesterol and blood lipids  You should start having your blood tested for lipids and cholesterol at 80 years of age, then have this test every 5 years.  You may need to have your cholesterol levels checked more often if:  Your lipid or cholesterol levels are high.  You are older than 80 years of age.  You are at high risk for heart disease.  CANCER SCREENING   Lung Cancer  Lung cancer screening is recommended for adults 79-59 years old who are at high risk for lung cancer because of a history of smoking.  A yearly low-dose CT scan of the lungs is recommended for people who:  Currently smoke.  Have quit within the past 15 years.  Have at least a 30-pack-year history of smoking. A pack year is smoking an average of one pack of cigarettes a day for 1 year.  Yearly screening should continue until it has been 15 years since you quit.  Yearly screening should stop if you develop a health problem that would prevent you from having lung cancer treatment.  Breast Cancer  Practice breast self-awareness. This means understanding how your breasts normally appear and feel.  It also means doing regular breast self-exams. Let your health care provider know about any changes, no matter how small.  If you are in your 20s or 30s, you should have a  clinical breast exam (CBE) by a health care provider every 1-3 years as part of a regular health exam.  If you are 23 or older, have a CBE every year. Also consider having a breast X-ray (mammogram) every year.  If you have a family history of breast cancer, talk to your health care provider about genetic screening.  If you are at high risk for breast cancer, talk to your health care provider about having an MRI and a mammogram every year.  Breast cancer gene (BRCA) assessment is recommended for women who have family members with BRCA-related cancers. BRCA-related  cancers include:  Breast.  Ovarian.  Tubal.  Peritoneal cancers.  Results of the assessment will determine the need for genetic counseling and BRCA1 and BRCA2 testing. Cervical Cancer Your health care provider may recommend that you be screened regularly for cancer of the pelvic organs (ovaries, uterus, and vagina). This screening involves a pelvic examination, including checking for microscopic changes to the surface of your cervix (Pap test). You may be encouraged to have this screening done every 3 years, beginning at age 74.  For women ages 73-65, health care providers may recommend pelvic exams and Pap testing every 3 years, or they may recommend the Pap and pelvic exam, combined with testing for human papilloma virus (HPV), every 5 years. Some types of HPV increase your risk of cervical cancer. Testing for HPV may also be done on women of any age with unclear Pap test results.  Other health care providers may not recommend any screening for nonpregnant women who are considered low risk for pelvic cancer and who do not have symptoms. Ask your health care provider if a screening pelvic exam is right for you.  If you have had past treatment for cervical cancer or a condition that could lead to cancer, you need Pap tests and screening for cancer for at least 20 years after your treatment. If Pap tests have been discontinued, your risk factors (such as having a new sexual partner) need to be reassessed to determine if screening should resume. Some women have medical problems that increase the chance of getting cervical cancer. In these cases, your health care provider may recommend more frequent screening and Pap tests. Colorectal Cancer  This type of cancer can be detected and often prevented.  Routine colorectal cancer screening usually begins at 80 years of age and continues through 80 years of age.  Your health care provider may recommend screening at an earlier age if you have risk  factors for colon cancer.  Your health care provider may also recommend using home test kits to check for hidden blood in the stool.  A small camera at the end of a tube can be used to examine your colon directly (sigmoidoscopy or colonoscopy). This is done to check for the earliest forms of colorectal cancer.  Routine screening usually begins at age 51.  Direct examination of the colon should be repeated every 5-10 years through 80 years of age. However, you may need to be screened more often if early forms of precancerous polyps or small growths are found. Skin Cancer  Check your skin from head to toe regularly.  Tell your health care provider about any new moles or changes in moles, especially if there is a change in a mole's shape or color.  Also tell your health care provider if you have a mole that is larger than the size of a pencil eraser.  Always use sunscreen. Apply sunscreen liberally  and repeatedly throughout the day.  Protect yourself by wearing long sleeves, pants, a wide-brimmed hat, and sunglasses whenever you are outside. HEART DISEASE, DIABETES, AND HIGH BLOOD PRESSURE   High blood pressure causes heart disease and increases the risk of stroke. High blood pressure is more likely to develop in:  People who have blood pressure in the high end of the normal range (130-139/85-89 mm Hg).  People who are overweight or obese.  People who are African American.  If you are 82-75 years of age, have your blood pressure checked every 3-5 years. If you are 31 years of age or older, have your blood pressure checked every year. You should have your blood pressure measured twice--once when you are at a hospital or clinic, and once when you are not at a hospital or clinic. Record the average of the two measurements. To check your blood pressure when you are not at a hospital or clinic, you can use:  An automated blood pressure machine at a pharmacy.  A home blood pressure  monitor.  If you are between 73 years and 82 years old, ask your health care provider if you should take aspirin to prevent strokes.  Have regular diabetes screenings. This involves taking a blood sample to check your fasting blood sugar level.  If you are at a normal weight and have a low risk for diabetes, have this test once every three years after 80 years of age.  If you are overweight and have a high risk for diabetes, consider being tested at a younger age or more often. PREVENTING INFECTION  Hepatitis B  If you have a higher risk for hepatitis B, you should be screened for this virus. You are considered at high risk for hepatitis B if:  You were born in a country where hepatitis B is common. Ask your health care provider which countries are considered high risk.  Your parents were born in a high-risk country, and you have not been immunized against hepatitis B (hepatitis B vaccine).  You have HIV or AIDS.  You use needles to inject street drugs.  You live with someone who has hepatitis B.  You have had sex with someone who has hepatitis B.  You get hemodialysis treatment.  You take certain medicines for conditions, including cancer, organ transplantation, and autoimmune conditions. Hepatitis C  Blood testing is recommended for:  Everyone born from [birth year] through 1963.  Anyone with known risk factors for hepatitis C. Sexually transmitted infections (STIs)  You should be screened for sexually transmitted infections (STIs) including gonorrhea and chlamydia if:  You are sexually active and are younger than 80 years of age.  You are older than 80 years of age and your health care provider tells you that you are at risk for this type of infection.  Your sexual activity has changed since you were last screened and you are at an increased risk for chlamydia or gonorrhea. Ask your health care provider if you are at risk.  If you do not have HIV, but are at risk, it may be  recommended that you take a prescription medicine daily to prevent HIV infection. This is called pre-exposure prophylaxis (PrEP). You are considered at risk if:  You are sexually active and do not regularly use condoms or know the HIV status of your partner(s).  You take drugs by injection.  You are sexually active with a partner who has HIV. Talk with your health care provider about whether you are  at high risk of being infected with HIV. If you choose to begin PrEP, you should first be tested for HIV. You should then be tested every 3 months for as long as you are taking PrEP.  PREGNANCY   If you are premenopausal and you may become pregnant, ask your health care provider about preconception counseling.  If you may become pregnant, take 400 to 800 micrograms (mcg) of folic acid every day.  If you want to prevent pregnancy, talk to your health care provider about birth control (contraception). OSTEOPOROSIS AND MENOPAUSE   Osteoporosis is a disease in which the bones lose minerals and strength with aging. This can result in serious bone fractures. Your risk for osteoporosis can be identified using a bone density scan.  If you are 33 years of age or older, or if you are at risk for osteoporosis and fractures, ask your health care provider if you should be screened.  Ask your health care provider whether you should take a calcium or vitamin D supplement to lower your risk for osteoporosis.  Menopause may have certain physical symptoms and risks.  Hormone replacement therapy may reduce some of these symptoms and risks. Talk to your health care provider about whether hormone replacement therapy is right for you.  HOME CARE INSTRUCTIONS   Schedule regular health, dental, and eye exams.  Stay current with your immunizations.   Do not use any tobacco products including cigarettes, chewing tobacco, or electronic cigarettes.  If you are pregnant, do not drink alcohol.  If you are  breastfeeding, limit how much and how often you drink alcohol.  Limit alcohol intake to no more than 1 drink per day for nonpregnant women. One drink equals 12 ounces of beer, 5 ounces of wine, or 1 ounces of hard liquor.  Do not use street drugs.  Do not share needles.  Ask your health care provider for help if you need support or information about quitting drugs.  Tell your health care provider if you often feel depressed.  Tell your health care provider if you have ever been abused or do not feel safe at home.   This information is not intended to replace advice given to you by your health care provider. Make sure you discuss any questions you have with your health care provider.   Document Released: 12/01/2010 Document Revised: 06/08/2014 Document Reviewed: 04/19/2013 Elsevier Interactive Patient Education [employer].

## 2015-07-24 NOTE — Assessment & Plan Note (Signed)
Continue f/u with renal.

## 2015-07-24 NOTE — Assessment & Plan Note (Signed)
lipitor stopped last week by renal. Continue to monitor with FLP. Last LDL 115.

## 2015-07-24 NOTE — Progress Notes (Signed)
Pre visit review using our clinic review tool, if applicable. No additional management support is needed unless otherwise documented below in the visit note. 

## 2015-07-24 NOTE — Assessment & Plan Note (Addendum)
Monitor for now, pt seems well today, no evidence of dehydration. Ok to continue immodium prn.  In recent h/o C diff (05/2015), sent home with C diff collection kit.

## 2015-07-24 NOTE — Progress Notes (Signed)
BP 150/52 mmHg  Pulse 73  Temp(Src) 98.4 F (36.9 C) (Oral)  Ht _0  (1.6 m)  Wt 124 lb 4 oz (56.359 kg)  BMI 22.02 kg/m2  SpO2 99%   CC: medicare wellness visit  Subjective:    Patient ID: [name], female    DOB: May 26, 1929, 82 y.o.   MRN: [id number]  HPI: [name] is a 82 y.o. female presenting on 07/24/2015 for Annual Exam   Presents with daughter. Just had birthday last week.   Last night diarrhea started - loose stool and more foul smelling. No fevers/chills, abd pain, UTI sxs. No increased confusion. Doesn't make urine.  Noted worsening memory loss over last few months. Lost teeth twice recently.  Did not take meds this morning. Needs to pick up coreg.  ESRD on HD TThSat.  Lipitor was recently stopped by renal.  Hearing screen - has hearing aide but doesn't regularly use.  Vision screen - eye exam needs to be rescheduled Fall risk screen - passed Depression screen - passed  Preventative: Colon cancer screening - aged out Lung cancer screening - not indicated Breast cancer screening - aged out Well woman exam - aged out DEXA scan - unsure Flu shot - yearly Tetanus shot - 2014 Pneumovax 2016, prevnar 2014 Shingles shot - not at this time. Advanced directive discussion - discussed. Does not have set up. Would want daughter and husband to be HCPOA. Handout provided today. Seat belt use discussed No changing moles on skin.  Lives with husband and daughter Occupation: retired, was Theatre manager Activity: in wheelchair, no regular exercise Diet: drinks water and gatorade, good fruits/vegetables, avoids salt in diet  Relevant past medical, surgical, family and social history reviewed and updated as indicated. Interim medical history since our last visit reviewed. Allergies and medications reviewed and updated. Current Outpatient Prescriptions on File Prior to Visit  Medication Sig  . acetaminophen (TYLENOL) 500 MG tablet Take 500 mg by mouth  2 (two) times daily as needed for mild pain.   [name] amLODipine (NORVASC) 5 MG tablet Take 2.5-5 mg by mouth daily. Take half tablet on Sundays mondays wednesdays and fridays. Whole tablet all other days of the week  . aspirin 81 MG tablet Take 81 mg by mouth daily.  . carvedilol (COREG) 3.125 MG tablet Take 1 tablet (3.125 mg total) by mouth 2 (two) times daily with a meal.  . cinacalcet (SENSIPAR) 30 MG tablet Take 30 mg by mouth daily.  . Dorzolamide HCl-Timolol Mal (COSOPT OP) Place 1 drop into both eyes daily.   [name] escitalopram (LEXAPRO) 10 MG tablet Take 0.5 tablets (5 mg total) by mouth daily.  . famotidine (PEPCID) 20 MG tablet Take 1 tablet (20 mg total) by mouth 2 (two) times daily. (Patient taking differently: Take 20 mg by mouth daily. )  . folic acid-vitamin b complex-vitamin c-selenium-zinc (DIALYVITE) 3 MG TABS Take 1 tablet by mouth daily.    . multivitamin (RENA-VIT) TABS tablet TAKE ONE TABLET BY MOUTH AT BEDTIME  . sevelamer carbonate (RENVELA) 800 MG tablet Take 2 tablets (1,600 mg total) by mouth 3 (three) times daily with meals.   No current facility-administered medications on file prior to visit.    Review of Systems Per HPI unless specifically indicated in ROS section     Objective:    BP 150/52 mmHg  Pulse 73  Temp(Src) 98.4 F (36.9 C) (Oral)  Ht _1  (1.6 m)  Wt 124 lb 4 oz (56.359  kg)  BMI 22.02 kg/m2  SpO2 99%  Wt Readings from Last 3 Encounters:  07/24/15 124 lb 4 oz (56.359 kg)  06/21/15 123 lb (55.792 kg)  05/24/15 123 lb (55.792 kg)    Physical Exam  Constitutional: She is oriented to person, place, and time. She appears well-developed and well-nourished. No distress.  In wheelchair  HENT:  Head: Normocephalic and atraumatic.  Right Ear: Tympanic membrane, external ear and ear canal normal. Decreased hearing is noted.  Left Ear: Tympanic membrane, external ear and ear canal normal. Decreased hearing is noted.  Nose: Nose normal.  Mouth/Throat:  Uvula is midline, oropharynx is clear and moist and mucous membranes are normal. No oropharyngeal exudate, posterior oropharyngeal edema or posterior oropharyngeal erythema.  Eyes: Conjunctivae and EOM are normal. Pupils are equal, round, and reactive to light. No scleral icterus.  Neck: Normal range of motion. Neck supple. No thyromegaly present.  Cardiovascular: Normal rate, regular rhythm, normal heart sounds and intact distal pulses.   No murmur heard. Pulses:      Radial pulses are 2+ on the right side, and 2+ on the left side.  Pulmonary/Chest: Effort normal and breath sounds normal. No respiratory distress. She has no wheezes. She has no rales.  Abdominal: Soft. Bowel sounds are normal. She exhibits no distension and no mass. There is no tenderness. There is no rebound and no guarding.  Musculoskeletal: Normal range of motion. She exhibits no edema.  Prior L forearm hematoma largely resolved Access is R upper arm  Lymphadenopathy:    She has no cervical adenopathy.  Neurological: She is alert and oriented to person, place, and time.  CN grossly intact, station and gait intact Recall 0/2, 1/2 with cue  Skin: Skin is warm and dry. No rash noted.  Psychiatric: She has a normal mood and affect.  Nursing note and vitals reviewed.  Results for orders placed or performed in visit on 07/19/15  Renal function panel  Result Value Ref Range   Sodium 138 135 - 145 mEq/L   Potassium 4.9 3.5 - 5.1 mEq/L   Chloride 95 (L) 96 - 112 mEq/L   CO2 33 (H) 19 - 32 mEq/L   Calcium 9.4 8.4 - 10.5 mg/dL   Albumin 3.9 3.5 - 5.2 g/dL   BUN 29 (H) 6 - 23 mg/dL   Creatinine, Ser 4.80 (HH) 0.40 - 1.20 mg/dL   Glucose, Bld 108 (H) 70 - 99 mg/dL   Phosphorus 3.1 2.3 - 4.6 mg/dL   GFR 11.09 (LL) >60.00 mL/min  Lipid panel  Result Value Ref Range   Cholesterol 200 0 - 200 mg/dL   Triglycerides 79.0 0.0 - 149.0 mg/dL   HDL 69.40 >39.00 mg/dL   VLDL 15.8 0.0 - 40.0 mg/dL   LDL Cholesterol 115 (H) 0 - 99  mg/dL   Total CHOL/HDL Ratio 3    NonHDL 130.46   Hemoglobin A1c  Result Value Ref Range   Hgb A1c MFr Bld 5.2 4.6 - 6.5 %  CBC with Differential/Platelet  Result Value Ref Range   WBC 4.6 4.0 - 10.5 K/uL   RBC 4.41 3.87 - 5.11 Mil/uL   Hemoglobin 12.6 12.0 - 15.0 g/dL   HCT 39.1 36.0 - 46.0 %   MCV 88.7 78.0 - 100.0 fl   MCHC 32.2 30.0 - 36.0 g/dL   RDW 23.1 (H) 11.5 - 15.5 %   Platelets 116.0 (L) 150.0 - 400.0 K/uL   Neutrophils Relative % 59.8 43.0 - 77.0 %  Lymphocytes Relative 30.0 12.0 - 46.0 %   Monocytes Relative 7.0 3.0 - 12.0 %   Eosinophils Relative 3.0 0.0 - 5.0 %   Basophils Relative 0.2 0.0 - 3.0 %   Neutro Abs 2.7 1.4 - 7.7 K/uL   Lymphs Abs 1.4 0.7 - 4.0 K/uL   Monocytes Absolute 0.3 0.1 - 1.0 K/uL   Eosinophils Absolute 0.1 0.0 - 0.7 K/uL   Basophils Absolute 0.0 0.0 - 0.1 K/uL  Parathyroid hormone, intact (no Ca)  Result Value Ref Range   PTH 264 (H) 14 - 64 pg/mL      Assessment & Plan:   Problem List Items Addressed This Visit    Thrombocytopenia ([place])    Stable. Continue to monitor.      Memory loss    Worsening memory loss noted by family. Discussed possible aricept as well as action and possible side effects. Daughter will consider and let me know if desires trial. Aware will not improve memory but may slow progression.      Medicare annual wellness visit, subsequent - Primary    I have personally reviewed the Medicare Annual Wellness questionnaire and have noted 1. The patient's medical and social history 2. Their use of alcohol, tobacco or illicit drugs 3. Their current medications and supplements 4. The patient's functional ability including ADL's, fall risks, home safety risks and hearing or visual impairment. Cognitive function has been assessed and addressed as indicated.  5. Diet and physical activity 6. Evidence for depression or mood disorders The patients weight, height, BMI have been recorded in the chart. I have made referrals,  counseling and provided education to the patient based on review of the above and I have provided the pt with a written personalized care plan for preventive services. Provider list updated.. See scanned questionairre as needed for further documentation. Reviewed preventative protocols and updated unless pt declined.       HLD (hyperlipidemia)    lipitor stopped last week by renal. Continue to monitor with FLP. Last LDL 115.       History of diabetes mellitus, type II    Stable off anti hyperglycemics. Continue to monitor.       Hearing loss    Cerumen impaction bilaterally - rec mineral oil. Discussed hearing aid use.      Essential hypertension    Elevated today. May not have taken bp meds this morning. No changes today.      ESRD on hemodialysis ([place])    Continue f/u with renal.      Diarrhea    Monitor for now, pt seems well today, no evidence of dehydration. Ok to continue immodium prn.  In recent h/o C diff (05/2015), sent home with C diff collection kit.       Relevant Orders   C. difficile GDH and Toxin A/B   Advanced care planning/counseling discussion    Advanced directive discussion - discussed. Does not have set up. Would want daughter and husband to be HCPOA. Handout provided today.          Follow up plan: Return in about 6 months (around 01/21/2016), or as needed, for follow up visit.

## 2015-07-24 NOTE — Assessment & Plan Note (Signed)
Stable off anti hyperglycemics. Continue to monitor.

## 2015-07-24 NOTE — Assessment & Plan Note (Signed)
Worsening memory loss noted by family. Discussed possible aricept as well as action and possible side effects. Daughter will consider and let me know if desires trial. Aware will not improve memory but may slow progression.

## 2015-07-24 NOTE — Assessment & Plan Note (Signed)
Stable.       - Continue to monitor

## 2015-07-24 NOTE — Assessment & Plan Note (Signed)

## 2015-07-24 NOTE — Assessment & Plan Note (Signed)
Elevated today. May not have taken bp meds this morning. No changes today.

## 2015-07-25 DIAGNOSIS — D631 Anemia in chronic kidney disease: Secondary | ICD-10-CM | POA: Diagnosis not present

## 2015-07-25 DIAGNOSIS — N186 End stage renal disease: Secondary | ICD-10-CM | POA: Diagnosis not present

## 2015-07-26 DIAGNOSIS — N186 End stage renal disease: Secondary | ICD-10-CM | POA: Diagnosis not present

## 2015-07-26 DIAGNOSIS — E1122 Type 2 diabetes mellitus with diabetic chronic kidney disease: Secondary | ICD-10-CM | POA: Diagnosis not present

## 2015-07-26 DIAGNOSIS — T827XXD Infection and inflammatory reaction due to other cardiac and vascular devices, implants and grafts, subsequent encounter: Secondary | ICD-10-CM | POA: Diagnosis not present

## 2015-07-26 DIAGNOSIS — I951 Orthostatic hypotension: Secondary | ICD-10-CM | POA: Diagnosis not present

## 2015-07-26 DIAGNOSIS — I120 Hypertensive chronic kidney disease with stage 5 chronic kidney disease or end stage renal disease: Secondary | ICD-10-CM | POA: Diagnosis not present

## 2015-07-26 DIAGNOSIS — N390 Urinary tract infection, site not specified: Secondary | ICD-10-CM | POA: Diagnosis not present

## 2015-07-27 DIAGNOSIS — D631 Anemia in chronic kidney disease: Secondary | ICD-10-CM | POA: Diagnosis not present

## 2015-07-27 DIAGNOSIS — N186 End stage renal disease: Secondary | ICD-10-CM | POA: Diagnosis not present

## 2015-07-30 DIAGNOSIS — N186 End stage renal disease: Secondary | ICD-10-CM | POA: Diagnosis not present

## 2015-07-30 DIAGNOSIS — D631 Anemia in chronic kidney disease: Secondary | ICD-10-CM | POA: Diagnosis not present

## 2015-07-30 DIAGNOSIS — Z992 Dependence on renal dialysis: Secondary | ICD-10-CM | POA: Diagnosis not present

## 2015-07-30 DIAGNOSIS — E1129 Type 2 diabetes mellitus with other diabetic kidney complication: Secondary | ICD-10-CM | POA: Diagnosis not present

## 2015-08-01 DIAGNOSIS — N186 End stage renal disease: Secondary | ICD-10-CM | POA: Diagnosis not present

## 2015-08-01 DIAGNOSIS — N2581 Secondary hyperparathyroidism of renal origin: Secondary | ICD-10-CM | POA: Diagnosis not present

## 2015-08-03 DIAGNOSIS — N2581 Secondary hyperparathyroidism of renal origin: Secondary | ICD-10-CM | POA: Diagnosis not present

## 2015-08-03 DIAGNOSIS — N186 End stage renal disease: Secondary | ICD-10-CM | POA: Diagnosis not present

## 2015-08-06 ENCOUNTER — Telehealth: Payer: Self-pay

## 2015-08-06 ENCOUNTER — Other Ambulatory Visit: Payer: Self-pay | Admitting: *Deleted

## 2015-08-06 DIAGNOSIS — N186 End stage renal disease: Secondary | ICD-10-CM | POA: Diagnosis not present

## 2015-08-06 DIAGNOSIS — N2581 Secondary hyperparathyroidism of renal origin: Secondary | ICD-10-CM | POA: Diagnosis not present

## 2015-08-06 MED ORDER — DONEPEZIL HCL 5 MG PO TABS: 5.0000 mg | ORAL_TABLET | Freq: Every day | ORAL | Status: DC

## 2015-08-06 MED ORDER — CARVEDILOL 3.125 MG PO TABS: 3.1250 mg | ORAL_TABLET | Freq: Two times a day (BID) | ORAL | Status: DC

## 2015-08-06 NOTE — Telephone Encounter (Signed)
plz notify aricept sent to pharmacy - monitor for nausea.

## 2015-08-06 NOTE — Telephone Encounter (Signed)
[name] left v/m that Dr Reece AgarG had mentioned starting pt on aricept and [name] request [facility]pt [facility] [address]. [name] request cb.

## 2015-08-07 NOTE — Telephone Encounter (Signed)
Message left advising pt's daughter.

## 2015-08-08 DIAGNOSIS — N186 End stage renal disease: Secondary | ICD-10-CM | POA: Diagnosis not present

## 2015-08-08 DIAGNOSIS — N2581 Secondary hyperparathyroidism of renal origin: Secondary | ICD-10-CM | POA: Diagnosis not present

## 2015-08-10 DIAGNOSIS — N2581 Secondary hyperparathyroidism of renal origin: Secondary | ICD-10-CM | POA: Diagnosis not present

## 2015-08-10 DIAGNOSIS — N186 End stage renal disease: Secondary | ICD-10-CM | POA: Diagnosis not present

## 2015-08-13 ENCOUNTER — Telehealth: Payer: Self-pay | Admitting: Family Medicine

## 2015-08-13 DIAGNOSIS — N2581 Secondary hyperparathyroidism of renal origin: Secondary | ICD-10-CM | POA: Diagnosis not present

## 2015-08-13 DIAGNOSIS — N186 End stage renal disease: Secondary | ICD-10-CM | POA: Diagnosis not present

## 2015-08-13 NOTE — Telephone Encounter (Signed)
Jacki ConesLaurie from [facility] called- is requesting a cb regarding a form. Please call [phone] ext [phone] Thanks

## 2015-08-14 NOTE — Telephone Encounter (Signed)
Message left for laurie to return my call.

## 2015-08-15 DIAGNOSIS — N2581 Secondary hyperparathyroidism of renal origin: Secondary | ICD-10-CM | POA: Diagnosis not present

## 2015-08-15 DIAGNOSIS — N186 End stage renal disease: Secondary | ICD-10-CM | POA: Diagnosis not present

## 2015-08-17 DIAGNOSIS — N2581 Secondary hyperparathyroidism of renal origin: Secondary | ICD-10-CM | POA: Diagnosis not present

## 2015-08-17 DIAGNOSIS — N186 End stage renal disease: Secondary | ICD-10-CM | POA: Diagnosis not present

## 2015-08-19 NOTE — Telephone Encounter (Signed)
Message left for Laurie to return my call.

## 2015-08-20 DIAGNOSIS — N186 End stage renal disease: Secondary | ICD-10-CM | POA: Diagnosis not present

## 2015-08-20 DIAGNOSIS — N2581 Secondary hyperparathyroidism of renal origin: Secondary | ICD-10-CM | POA: Diagnosis not present

## 2015-08-21 DIAGNOSIS — H401130 Primary open-angle glaucoma, bilateral, stage unspecified: Secondary | ICD-10-CM | POA: Diagnosis not present

## 2015-08-21 DIAGNOSIS — H353113 Nonexudative age-related macular degeneration, right eye, advanced atrophic without subfoveal involvement: Secondary | ICD-10-CM | POA: Diagnosis not present

## 2015-08-21 DIAGNOSIS — H401131 Primary open-angle glaucoma, bilateral, mild stage: Secondary | ICD-10-CM | POA: Diagnosis not present

## 2015-08-21 DIAGNOSIS — H31009 Unspecified chorioretinal scars, unspecified eye: Secondary | ICD-10-CM | POA: Diagnosis not present

## 2015-08-21 DIAGNOSIS — E113553 Type 2 diabetes mellitus with stable proliferative diabetic retinopathy, bilateral: Secondary | ICD-10-CM | POA: Diagnosis not present

## 2015-08-21 DIAGNOSIS — H4720 Unspecified optic atrophy: Secondary | ICD-10-CM | POA: Diagnosis not present

## 2015-08-22 DIAGNOSIS — N186 End stage renal disease: Secondary | ICD-10-CM | POA: Diagnosis not present

## 2015-08-22 DIAGNOSIS — N2581 Secondary hyperparathyroidism of renal origin: Secondary | ICD-10-CM | POA: Diagnosis not present

## 2015-08-23 ENCOUNTER — Telehealth: Payer: Self-pay | Admitting: *Deleted

## 2015-08-23 NOTE — Telephone Encounter (Signed)
[facility] in [facility] for completion.

## 2015-08-24 DIAGNOSIS — N186 End stage renal disease: Secondary | ICD-10-CM | POA: Diagnosis not present

## 2015-08-24 DIAGNOSIS — N2581 Secondary hyperparathyroidism of renal origin: Secondary | ICD-10-CM | POA: Diagnosis not present

## 2015-08-25 NOTE — Telephone Encounter (Signed)
Form signed and in Kim[place]' box.

## 2015-08-26 NOTE — Telephone Encounter (Signed)
Faxed to [facility]  

## 2015-08-27 DIAGNOSIS — N186 End stage renal disease: Secondary | ICD-10-CM | POA: Diagnosis not present

## 2015-08-27 DIAGNOSIS — N2581 Secondary hyperparathyroidism of renal origin: Secondary | ICD-10-CM | POA: Diagnosis not present

## 2015-08-29 DIAGNOSIS — N2581 Secondary hyperparathyroidism of renal origin: Secondary | ICD-10-CM | POA: Diagnosis not present

## 2015-08-29 DIAGNOSIS — N186 End stage renal disease: Secondary | ICD-10-CM | POA: Diagnosis not present

## 2015-08-30 DIAGNOSIS — E1129 Type 2 diabetes mellitus with other diabetic kidney complication: Secondary | ICD-10-CM | POA: Diagnosis not present

## 2015-08-30 DIAGNOSIS — Z992 Dependence on renal dialysis: Secondary | ICD-10-CM | POA: Diagnosis not present

## 2015-08-30 DIAGNOSIS — N186 End stage renal disease: Secondary | ICD-10-CM | POA: Diagnosis not present

## 2015-08-31 DIAGNOSIS — N186 End stage renal disease: Secondary | ICD-10-CM | POA: Diagnosis not present

## 2015-09-03 DIAGNOSIS — N186 End stage renal disease: Secondary | ICD-10-CM | POA: Diagnosis not present

## 2015-09-05 DIAGNOSIS — N186 End stage renal disease: Secondary | ICD-10-CM | POA: Diagnosis not present

## 2015-09-07 DIAGNOSIS — N186 End stage renal disease: Secondary | ICD-10-CM | POA: Diagnosis not present

## 2015-09-10 DIAGNOSIS — N186 End stage renal disease: Secondary | ICD-10-CM | POA: Diagnosis not present

## 2015-09-12 DIAGNOSIS — N186 End stage renal disease: Secondary | ICD-10-CM | POA: Diagnosis not present

## 2015-09-14 DIAGNOSIS — N186 End stage renal disease: Secondary | ICD-10-CM | POA: Diagnosis not present

## 2015-09-17 DIAGNOSIS — N186 End stage renal disease: Secondary | ICD-10-CM | POA: Diagnosis not present

## 2015-09-19 DIAGNOSIS — N186 End stage renal disease: Secondary | ICD-10-CM | POA: Diagnosis not present

## 2015-09-21 DIAGNOSIS — N186 End stage renal disease: Secondary | ICD-10-CM | POA: Diagnosis not present

## 2015-09-24 DIAGNOSIS — N186 End stage renal disease: Secondary | ICD-10-CM | POA: Diagnosis not present

## 2015-09-26 DIAGNOSIS — N186 End stage renal disease: Secondary | ICD-10-CM | POA: Diagnosis not present

## 2015-09-28 DIAGNOSIS — N186 End stage renal disease: Secondary | ICD-10-CM | POA: Diagnosis not present

## 2015-09-29 DIAGNOSIS — N186 End stage renal disease: Secondary | ICD-10-CM | POA: Diagnosis not present

## 2015-09-29 DIAGNOSIS — Z992 Dependence on renal dialysis: Secondary | ICD-10-CM | POA: Diagnosis not present

## 2015-09-29 DIAGNOSIS — E1129 Type 2 diabetes mellitus with other diabetic kidney complication: Secondary | ICD-10-CM | POA: Diagnosis not present

## 2015-09-30 ENCOUNTER — Telehealth: Payer: Self-pay

## 2015-09-30 NOTE — Telephone Encounter (Signed)
[name] (DPR signed) left v/m requesting cb about memory loss medication. [name] said aricept has agitated the pt. [name] stopped the aricept for now. [facility] Namenda. [name] request cb. [facility] [address].

## 2015-10-01 DIAGNOSIS — D631 Anemia in chronic kidney disease: Secondary | ICD-10-CM | POA: Diagnosis not present

## 2015-10-01 DIAGNOSIS — E1129 Type 2 diabetes mellitus with other diabetic kidney complication: Secondary | ICD-10-CM | POA: Diagnosis not present

## 2015-10-01 DIAGNOSIS — N186 End stage renal disease: Secondary | ICD-10-CM | POA: Diagnosis not present

## 2015-10-01 MED ORDER — MEMANTINE HCL 5 MG PO TABS: 5.0000 mg | ORAL_TABLET | Freq: Every day | ORAL | Status: DC

## 2015-10-01 NOTE — Telephone Encounter (Signed)
Have removed aricept from med list.  Ok to trial namenda - sent to pharmacy. Trial 5mg  daily. If tolerated, may increase to twice daily.

## 2015-10-01 NOTE — Telephone Encounter (Signed)
Patient's daughter notified.

## 2015-10-03 DIAGNOSIS — N186 End stage renal disease: Secondary | ICD-10-CM | POA: Diagnosis not present

## 2015-10-03 DIAGNOSIS — E1129 Type 2 diabetes mellitus with other diabetic kidney complication: Secondary | ICD-10-CM | POA: Diagnosis not present

## 2015-10-03 DIAGNOSIS — D631 Anemia in chronic kidney disease: Secondary | ICD-10-CM | POA: Diagnosis not present

## 2015-10-05 DIAGNOSIS — N186 End stage renal disease: Secondary | ICD-10-CM | POA: Diagnosis not present

## 2015-10-05 DIAGNOSIS — E1129 Type 2 diabetes mellitus with other diabetic kidney complication: Secondary | ICD-10-CM | POA: Diagnosis not present

## 2015-10-05 DIAGNOSIS — D631 Anemia in chronic kidney disease: Secondary | ICD-10-CM | POA: Diagnosis not present

## 2015-10-08 DIAGNOSIS — E1129 Type 2 diabetes mellitus with other diabetic kidney complication: Secondary | ICD-10-CM | POA: Diagnosis not present

## 2015-10-08 DIAGNOSIS — D631 Anemia in chronic kidney disease: Secondary | ICD-10-CM | POA: Diagnosis not present

## 2015-10-08 DIAGNOSIS — N186 End stage renal disease: Secondary | ICD-10-CM | POA: Diagnosis not present

## 2015-10-10 DIAGNOSIS — D631 Anemia in chronic kidney disease: Secondary | ICD-10-CM | POA: Diagnosis not present

## 2015-10-10 DIAGNOSIS — N186 End stage renal disease: Secondary | ICD-10-CM | POA: Diagnosis not present

## 2015-10-10 DIAGNOSIS — E1129 Type 2 diabetes mellitus with other diabetic kidney complication: Secondary | ICD-10-CM | POA: Diagnosis not present

## 2015-10-11 ENCOUNTER — Other Ambulatory Visit: Payer: Self-pay | Admitting: *Deleted

## 2015-10-11 MED ORDER — AMLODIPINE BESYLATE 5 MG PO TABS: ORAL_TABLET | ORAL | Status: DC

## 2015-10-11 MED ORDER — AMLODIPINE BESYLATE 5 MG PO TABS: 2.5000 mg | ORAL_TABLET | ORAL | Status: DC

## 2015-10-12 DIAGNOSIS — D631 Anemia in chronic kidney disease: Secondary | ICD-10-CM | POA: Diagnosis not present

## 2015-10-12 DIAGNOSIS — N186 End stage renal disease: Secondary | ICD-10-CM | POA: Diagnosis not present

## 2015-10-12 DIAGNOSIS — E1129 Type 2 diabetes mellitus with other diabetic kidney complication: Secondary | ICD-10-CM | POA: Diagnosis not present

## 2015-10-14 DIAGNOSIS — N186 End stage renal disease: Secondary | ICD-10-CM | POA: Diagnosis not present

## 2015-10-14 DIAGNOSIS — I871 Compression of vein: Secondary | ICD-10-CM | POA: Diagnosis not present

## 2015-10-14 DIAGNOSIS — Z992 Dependence on renal dialysis: Secondary | ICD-10-CM | POA: Diagnosis not present

## 2015-10-14 DIAGNOSIS — T82868D Thrombosis of vascular prosthetic devices, implants and grafts, subsequent encounter: Secondary | ICD-10-CM | POA: Diagnosis not present

## 2015-10-15 DIAGNOSIS — N186 End stage renal disease: Secondary | ICD-10-CM | POA: Diagnosis not present

## 2015-10-15 DIAGNOSIS — D631 Anemia in chronic kidney disease: Secondary | ICD-10-CM | POA: Diagnosis not present

## 2015-10-15 DIAGNOSIS — E1129 Type 2 diabetes mellitus with other diabetic kidney complication: Secondary | ICD-10-CM | POA: Diagnosis not present

## 2015-10-17 ENCOUNTER — Other Ambulatory Visit: Payer: Self-pay | Admitting: Family Medicine

## 2015-10-17 DIAGNOSIS — E1129 Type 2 diabetes mellitus with other diabetic kidney complication: Secondary | ICD-10-CM | POA: Diagnosis not present

## 2015-10-17 DIAGNOSIS — D631 Anemia in chronic kidney disease: Secondary | ICD-10-CM | POA: Diagnosis not present

## 2015-10-17 DIAGNOSIS — N186 End stage renal disease: Secondary | ICD-10-CM | POA: Diagnosis not present

## 2015-10-19 DIAGNOSIS — D631 Anemia in chronic kidney disease: Secondary | ICD-10-CM | POA: Diagnosis not present

## 2015-10-19 DIAGNOSIS — E1129 Type 2 diabetes mellitus with other diabetic kidney complication: Secondary | ICD-10-CM | POA: Diagnosis not present

## 2015-10-19 DIAGNOSIS — N186 End stage renal disease: Secondary | ICD-10-CM | POA: Diagnosis not present

## 2015-10-22 DIAGNOSIS — N186 End stage renal disease: Secondary | ICD-10-CM | POA: Diagnosis not present

## 2015-10-22 DIAGNOSIS — D631 Anemia in chronic kidney disease: Secondary | ICD-10-CM | POA: Diagnosis not present

## 2015-10-22 DIAGNOSIS — E1129 Type 2 diabetes mellitus with other diabetic kidney complication: Secondary | ICD-10-CM | POA: Diagnosis not present

## 2015-10-24 ENCOUNTER — Other Ambulatory Visit: Payer: Self-pay | Admitting: *Deleted

## 2015-10-24 DIAGNOSIS — N186 End stage renal disease: Secondary | ICD-10-CM | POA: Diagnosis not present

## 2015-10-24 DIAGNOSIS — E1129 Type 2 diabetes mellitus with other diabetic kidney complication: Secondary | ICD-10-CM | POA: Diagnosis not present

## 2015-10-24 DIAGNOSIS — D631 Anemia in chronic kidney disease: Secondary | ICD-10-CM | POA: Diagnosis not present

## 2015-10-24 MED ORDER — SEVELAMER CARBONATE 800 MG PO TABS: 1600.0000 mg | ORAL_TABLET | Freq: Three times a day (TID) | ORAL | Status: DC

## 2015-10-25 DIAGNOSIS — S92315A Nondisplaced fracture of first metatarsal bone, left foot, initial encounter for closed fracture: Secondary | ICD-10-CM | POA: Diagnosis not present

## 2015-10-25 DIAGNOSIS — S8001XA Contusion of right knee, initial encounter: Secondary | ICD-10-CM | POA: Diagnosis not present

## 2015-10-26 DIAGNOSIS — N186 End stage renal disease: Secondary | ICD-10-CM | POA: Diagnosis not present

## 2015-10-26 DIAGNOSIS — E1129 Type 2 diabetes mellitus with other diabetic kidney complication: Secondary | ICD-10-CM | POA: Diagnosis not present

## 2015-10-26 DIAGNOSIS — D631 Anemia in chronic kidney disease: Secondary | ICD-10-CM | POA: Diagnosis not present

## 2015-10-29 DIAGNOSIS — E1129 Type 2 diabetes mellitus with other diabetic kidney complication: Secondary | ICD-10-CM | POA: Diagnosis not present

## 2015-10-29 DIAGNOSIS — N186 End stage renal disease: Secondary | ICD-10-CM | POA: Diagnosis not present

## 2015-10-29 DIAGNOSIS — D631 Anemia in chronic kidney disease: Secondary | ICD-10-CM | POA: Diagnosis not present

## 2015-10-30 DIAGNOSIS — E1129 Type 2 diabetes mellitus with other diabetic kidney complication: Secondary | ICD-10-CM | POA: Diagnosis not present

## 2015-10-30 DIAGNOSIS — N186 End stage renal disease: Secondary | ICD-10-CM | POA: Diagnosis not present

## 2015-10-30 DIAGNOSIS — Z992 Dependence on renal dialysis: Secondary | ICD-10-CM | POA: Diagnosis not present

## 2015-10-31 DIAGNOSIS — E1129 Type 2 diabetes mellitus with other diabetic kidney complication: Secondary | ICD-10-CM | POA: Diagnosis not present

## 2015-10-31 DIAGNOSIS — D631 Anemia in chronic kidney disease: Secondary | ICD-10-CM | POA: Diagnosis not present

## 2015-10-31 DIAGNOSIS — N186 End stage renal disease: Secondary | ICD-10-CM | POA: Diagnosis not present

## 2015-11-02 DIAGNOSIS — N186 End stage renal disease: Secondary | ICD-10-CM | POA: Diagnosis not present

## 2015-11-02 DIAGNOSIS — E1129 Type 2 diabetes mellitus with other diabetic kidney complication: Secondary | ICD-10-CM | POA: Diagnosis not present

## 2015-11-02 DIAGNOSIS — D631 Anemia in chronic kidney disease: Secondary | ICD-10-CM | POA: Diagnosis not present

## 2015-11-05 DIAGNOSIS — N186 End stage renal disease: Secondary | ICD-10-CM | POA: Diagnosis not present

## 2015-11-05 DIAGNOSIS — E1129 Type 2 diabetes mellitus with other diabetic kidney complication: Secondary | ICD-10-CM | POA: Diagnosis not present

## 2015-11-05 DIAGNOSIS — D631 Anemia in chronic kidney disease: Secondary | ICD-10-CM | POA: Diagnosis not present

## 2015-11-07 DIAGNOSIS — E1129 Type 2 diabetes mellitus with other diabetic kidney complication: Secondary | ICD-10-CM | POA: Diagnosis not present

## 2015-11-07 DIAGNOSIS — N186 End stage renal disease: Secondary | ICD-10-CM | POA: Diagnosis not present

## 2015-11-07 DIAGNOSIS — D631 Anemia in chronic kidney disease: Secondary | ICD-10-CM | POA: Diagnosis not present

## 2015-11-08 DIAGNOSIS — S8001XD Contusion of right knee, subsequent encounter: Secondary | ICD-10-CM | POA: Diagnosis not present

## 2015-11-08 DIAGNOSIS — S92315D Nondisplaced fracture of first metatarsal bone, left foot, subsequent encounter for fracture with routine healing: Secondary | ICD-10-CM | POA: Diagnosis not present

## 2015-11-09 DIAGNOSIS — E1129 Type 2 diabetes mellitus with other diabetic kidney complication: Secondary | ICD-10-CM | POA: Diagnosis not present

## 2015-11-09 DIAGNOSIS — N186 End stage renal disease: Secondary | ICD-10-CM | POA: Diagnosis not present

## 2015-11-09 DIAGNOSIS — D631 Anemia in chronic kidney disease: Secondary | ICD-10-CM | POA: Diagnosis not present

## 2015-11-12 DIAGNOSIS — N186 End stage renal disease: Secondary | ICD-10-CM | POA: Diagnosis not present

## 2015-11-12 DIAGNOSIS — D631 Anemia in chronic kidney disease: Secondary | ICD-10-CM | POA: Diagnosis not present

## 2015-11-12 DIAGNOSIS — E1129 Type 2 diabetes mellitus with other diabetic kidney complication: Secondary | ICD-10-CM | POA: Diagnosis not present

## 2015-11-14 DIAGNOSIS — N186 End stage renal disease: Secondary | ICD-10-CM | POA: Diagnosis not present

## 2015-11-14 DIAGNOSIS — E1129 Type 2 diabetes mellitus with other diabetic kidney complication: Secondary | ICD-10-CM | POA: Diagnosis not present

## 2015-11-14 DIAGNOSIS — D631 Anemia in chronic kidney disease: Secondary | ICD-10-CM | POA: Diagnosis not present

## 2015-11-16 DIAGNOSIS — D631 Anemia in chronic kidney disease: Secondary | ICD-10-CM | POA: Diagnosis not present

## 2015-11-16 DIAGNOSIS — N186 End stage renal disease: Secondary | ICD-10-CM | POA: Diagnosis not present

## 2015-11-16 DIAGNOSIS — E1129 Type 2 diabetes mellitus with other diabetic kidney complication: Secondary | ICD-10-CM | POA: Diagnosis not present

## 2015-11-19 DIAGNOSIS — N186 End stage renal disease: Secondary | ICD-10-CM | POA: Diagnosis not present

## 2015-11-19 DIAGNOSIS — D631 Anemia in chronic kidney disease: Secondary | ICD-10-CM | POA: Diagnosis not present

## 2015-11-19 DIAGNOSIS — E1129 Type 2 diabetes mellitus with other diabetic kidney complication: Secondary | ICD-10-CM | POA: Diagnosis not present

## 2015-11-20 MED ORDER — MEMANTINE HCL 5 MG PO TABS: 5.0000 mg | ORAL_TABLET | Freq: Two times a day (BID) | ORAL | Status: DC

## 2015-11-20 NOTE — Telephone Encounter (Signed)
[name] called back and notified refill done; pt doing better; not as confused.

## 2015-11-20 NOTE — Telephone Encounter (Signed)
[name] (DPR signed) left v/m; pt has been taking memantine 5 mg one tab bid. [name] request refill with new instructions.  10/01/15 note if tolerated 5 mg daily may increase to twice daily. Refill done as instructed per 10/01/15 note. [facility]; rings but no answer or v/m. Pt has f/u appt on 01/22/16.

## 2015-11-20 NOTE — Addendum Note (Signed)
Addended by: Patience MuscaISLEY, RENA M on: 11/20/2015 12:30 PM   Modules accepted: Orders

## 2015-11-21 DIAGNOSIS — E1129 Type 2 diabetes mellitus with other diabetic kidney complication: Secondary | ICD-10-CM | POA: Diagnosis not present

## 2015-11-21 DIAGNOSIS — N186 End stage renal disease: Secondary | ICD-10-CM | POA: Diagnosis not present

## 2015-11-21 DIAGNOSIS — D631 Anemia in chronic kidney disease: Secondary | ICD-10-CM | POA: Diagnosis not present

## 2015-11-23 DIAGNOSIS — E1129 Type 2 diabetes mellitus with other diabetic kidney complication: Secondary | ICD-10-CM | POA: Diagnosis not present

## 2015-11-23 DIAGNOSIS — N186 End stage renal disease: Secondary | ICD-10-CM | POA: Diagnosis not present

## 2015-11-23 DIAGNOSIS — D631 Anemia in chronic kidney disease: Secondary | ICD-10-CM | POA: Diagnosis not present

## 2015-11-25 ENCOUNTER — Telehealth: Payer: Self-pay

## 2015-11-25 ENCOUNTER — Ambulatory Visit: Payer: Medicare Other | Admitting: Internal Medicine

## 2015-11-25 NOTE — Telephone Encounter (Signed)
Spoke to [name]. She thinks the patient will be ok with getting on the table. She does not need to to [facility] right [facility]. I made her an appt with Ara KussmaulKate Calrk on Wednesday afternoon. If she gets worse, they will go to UC or ER.

## 2015-11-25 NOTE — Telephone Encounter (Signed)
Pt having memory issues and so not sure if increased confusion is related to memory issues or confusion due to possible UTI. Pt wears depends so incontinent of urine which is usual and now pt having loose stools incontinently also. No fever. Pt does not want to come in for appt due to our office not being able to do cath. Does not want to go to ED;[name] wants to know if order can be put in for [facility] to come out and do eval and cath pt; advanced contact # [phone]. [name] request cb.

## 2015-11-25 NOTE — Telephone Encounter (Signed)
[facility] completed management 07/2015. To return to home they require in office face to face eval within 30 days.  If [name] thinks patient would be able to get on exam table we could try catheterization in office for UA/UCx.  Does she still have c diff kit? If worsening stool incontinence/diarrhea would offer this.

## 2015-11-26 DIAGNOSIS — D631 Anemia in chronic kidney disease: Secondary | ICD-10-CM | POA: Diagnosis not present

## 2015-11-26 DIAGNOSIS — N186 End stage renal disease: Secondary | ICD-10-CM | POA: Diagnosis not present

## 2015-11-26 DIAGNOSIS — E1129 Type 2 diabetes mellitus with other diabetic kidney complication: Secondary | ICD-10-CM | POA: Diagnosis not present

## 2015-11-27 ENCOUNTER — Ambulatory Visit (INDEPENDENT_AMBULATORY_CARE_PROVIDER_SITE_OTHER): Payer: Medicare Other | Admitting: Primary Care

## 2015-11-27 ENCOUNTER — Encounter: Payer: Self-pay | Admitting: Primary Care

## 2015-11-27 VITALS — BP 138/68 | HR 68 | Temp 98.2°F | Ht 63.0 in | Wt 122.4 lb

## 2015-11-27 DIAGNOSIS — R319 Hematuria, unspecified: Secondary | ICD-10-CM

## 2015-11-27 DIAGNOSIS — N390 Urinary tract infection, site not specified: Secondary | ICD-10-CM | POA: Diagnosis not present

## 2015-11-27 LAB — POC URINALSYSI DIPSTICK (AUTOMATED)
BILIRUBIN UA: NEGATIVE
KETONES UA: NEGATIVE
Nitrite, UA: POSITIVE
SPEC GRAV UA: 1.02
Urobilinogen, UA: 0.2
pH, UA: 7

## 2015-11-27 MED ORDER — CEPHALEXIN 500 MG PO CAPS: 500.0000 mg | ORAL_CAPSULE | Freq: Two times a day (BID) | ORAL | Status: DC

## 2015-11-27 NOTE — Patient Instructions (Signed)
Start Cephalexin antibiotics. Take 1 capsule by mouth twice daily for 7 days.  Please notify me if no improvement in symptoms in 3-4 days.  It was a pleasure meeting you!  Urinary Tract Infection Urinary tract infections (UTIs) can develop anywhere along your urinary tract. Your urinary tract is your body's drainage system for removing wastes and extra water. Your urinary tract includes two kidneys, two ureters, a bladder, and a urethra. Your kidneys are a pair of bean-shaped organs. Each kidney is about the size of your fist. They are located below your ribs, one on each side of your spine. CAUSES Infections are caused by microbes, which are microscopic organisms, including fungi, viruses, and bacteria. These organisms are so small that they can only be seen through a microscope. Bacteria are the microbes that most commonly cause UTIs. SYMPTOMS  Symptoms of UTIs may vary by age and gender of the patient and by the location of the infection. Symptoms in young women typically include a frequent and intense urge to urinate and a painful, burning feeling in the bladder or urethra during urination. Older women and men are more likely to be tired, shaky, and weak and have muscle aches and abdominal pain. A fever may mean the infection is in your kidneys. Other symptoms of a kidney infection include pain in your back or sides below the ribs, nausea, and vomiting. DIAGNOSIS To diagnose a UTI, your caregiver will ask you about your symptoms. Your caregiver will also ask you to provide a urine sample. The urine sample will be tested for bacteria and white blood cells. White blood cells are made by your body to help fight infection. TREATMENT  Typically, UTIs can be treated with medication. Because most UTIs are caused by a bacterial infection, they usually can be treated with the use of antibiotics. The choice of antibiotic and length of treatment depend on your symptoms and the type of bacteria causing your  infection. HOME CARE INSTRUCTIONS  If you were prescribed antibiotics, take them exactly as your caregiver instructs you. Finish the medication even if you feel better after you have only taken some of the medication.  Drink enough water and fluids to keep your urine clear or pale yellow.  Avoid caffeine, tea, and carbonated beverages. They tend to irritate your bladder.  Empty your bladder often. Avoid holding urine for long periods of time.  Empty your bladder before and after sexual intercourse.  After a bowel movement, women should cleanse from front to back. Use each tissue only once. SEEK MEDICAL CARE IF:   You have back pain.  You develop a fever.  Your symptoms do not begin to resolve within 3 days. SEEK IMMEDIATE MEDICAL CARE IF:   You have severe back pain or lower abdominal pain.  You develop chills.  You have nausea or vomiting.  You have continued burning or discomfort with urination. MAKE SURE YOU:   Understand these instructions.  Will watch your condition.  Will get help right away if you are not doing well or get worse.   This information is not intended to replace advice given to you by your health care provider. Make sure you discuss any questions you have with your health care provider.   Document Released: 02/25/2005 Document Revised: 02/06/2015 Document Reviewed: 06/26/2011 Elsevier Interactive Patient Education [employer]2016 Elsevier Inc.

## 2015-11-27 NOTE — Progress Notes (Signed)
Subjective:    Patient ID: [name], female    DOB: 11/23/1928, 82 y.o.   MRN: [id number]  HPI  Ms. [name] is an 82 year old female who presents today with her daughter who reports acute confusion. She has a history of confusion is currently managed on memantine 5 mg twice daily.   Saturday afternoon her daughter noticed acute confusion, her mother moving more slowly, her mother being more quiet and not herself, and loose bowel movements. Her bowel movements have solidified. She is does not urinate as she is on hemodialysis, and does have a history of urinary tract infections. The last time her mother developed acute confusion like this she had a urinary tract infection. Denies pelvic pressure, vaginal discharge, nausea, vomiting.  Review of Systems  Constitutional: Positive for fatigue. Negative for fever.  Gastrointestinal: Positive for diarrhea. Negative for abdominal pain.  Genitourinary: Negative for pelvic pain.  Psychiatric/Behavioral: Positive for confusion.       Past Medical History  Diagnosis Date  . Cough   . Diabetes mellitus     type 2  . GERD (gastroesophageal reflux disease)   . Hypertension   . Bronchitis   . Osteoarthritis   . Dyslipidemia     remote hx/notes 10/06/2009  . ESRD (end stage renal disease) on dialysis [facility](HCC)     Dr Coladonato/Powell  . Depression     lexapro  . Staphylococcus aureus bacteremia with sepsis (HCC)     thought from HD catheter  . Anxiety   . UTI (urinary tract infection) 05/2015  . 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 C. difficile diarrhea 02/01/2015  . Thrombocytopenia (HCC) 11/16/2014  . Traumatic hematoma of left forearm 05/25/2015     Social History   Social History  . Marital Status: Married    Spouse Name: N/A  . Number of Children: N/A  . Years of Education: N/A   Occupational History  . Not on file.   Social History Main Topics  . Smoking status: Never Smoker   . Smokeless tobacco: Never  Used  . Alcohol Use: No  . Drug Use: No  . Sexual Activity: No   Other Topics Concern  . Not on file   Social History Narrative   Lives with husband and daughter   Occupation: retired, was Marine scientistinsurance underwriter   Activity: in wheelchair, no regular exercise   Diet: drinks water and gatorade, good fruits/vegetables, avoids salt in diet    Past Surgical History  Procedure Laterality Date  . Eye surgery      Cataract extraction  . Thrombectomy and revision of arterioventous (av) goretex  graft Left 12/2004; 01/2006; 03/12/2009    forearmnotes 10/13/2010; forearmnotes 10/13/2010; upper arm/notes 03/19/2009  . Abdominal hysterectomy      remote hx/notes 10/06/2009  . Vitrectomy Left 03/2001    with membrane peel/notes 10/13/2010  . Arteriovenous graft placement Left 07/2004    forearm/notes 10/13/2010  . Arteriovenous graft placement Left 03/2006    upper armnotes 10/13/2010  . Peripheral vascular catheterization Right 02/18/2015    Procedure: A/V Shuntogram/Fistulagram;  Surgeon: Annice NeedyJason S [name], MD;  Location: [facility] INVASIVE CV LAB;  Service: Cardiovascular;  Laterality: Right;  . Peripheral vascular catheterization N/A 02/18/2015    Procedure: A/V Shunt Intervention;  Surgeon: Annice NeedyJason S [name], MD;  Location: [facility] INVASIVE CV LAB;  Service: Cardiovascular;  Laterality: N/A;  . Peripheral vascular catheterization N/A 03/06/2015    Procedure: Dialysis/Perma Catheter Insertion;  Surgeon: Annice NeedyJason S [name],  MD;  Location: [facility] INVASIVE CV LAB;  Service: Cardiovascular;  Laterality: N/A;  . Revision of arteriovenous goretex graft Right 03/08/2015    Procedure: REVISION OF ARTERIOVENOUS GORETEX GRAFT;  Surgeon: Renford DillsGregory G Schnier, MD;  Location: [facility] ORS;  Service: Vascular;  Laterality: Right;  . Avgg removal Right 04/10/2015    Procedure: REMOVAL OF RIGHT ARM  ARTERIOVENOUS GORETEX GRAFT (AVGG);  Surgeon: Fransisco HertzBrian L Chen, MD;  [facility];  Service: Vascular;  Laterality: Right;  . Insertion of dialysis catheter  Left 04/10/2015    Procedure: INSERTION OF DIALYSIS CATHETER LEFT GROIN;  Surgeon: Fransisco HertzBrian L Chen, MD;  [facility];  Service: Vascular;  Laterality: Left;  . Removal of a dialysis catheter Right 04/10/2015    Procedure: REMOVAL OF RIGHT GROIN DIALYSIS CATHETER;  Surgeon: Fransisco HertzBrian L Chen, MD;  [facility];  Service: Vascular;  Laterality: Right;    Family History  Problem Relation Age of Onset  . Hypertension Mother     No Known Allergies  Current Outpatient Prescriptions on File Prior to Visit  Medication Sig Dispense Refill  . acetaminophen (TYLENOL) 500 MG tablet Take 500 mg by mouth 2 (two) times daily as needed for mild pain.     [name]. amLODipine (NORVASC) 5 MG tablet Take half tablet on Sundays mondays wednesdays and fridays. Whole tablet all other days of the week 30 tablet 3  . aspirin 81 MG tablet Take 81 mg by mouth daily.    . carvedilol (COREG) 3.125 MG tablet Take 1 tablet (3.125 mg total) by mouth 2 (two) times daily with a meal. 60 tablet 6  . cinacalcet (SENSIPAR) 30 MG tablet Take 30 mg by mouth daily.    . Dorzolamide HCl-Timolol Mal (COSOPT OP) Place 1 drop into both eyes daily.     [name]. escitalopram (LEXAPRO) 10 MG tablet TAKE ONE-HALF TABLET BY MOUTH ONCE DAILY 45 tablet 3  . famotidine (PEPCID) 20 MG tablet Take 1 tablet (20 mg total) by mouth 2 (two) times daily. (Patient taking differently: Take 20 mg by mouth daily. ) 60 tablet 0  . folic acid-vitamin b complex-vitamin c-selenium-zinc (DIALYVITE) 3 MG TABS Take 1 tablet by mouth daily.      . memantine (NAMENDA) 5 MG tablet Take 1 tablet (5 mg total) by mouth 2 (two) times daily. 60 tablet 3  . multivitamin (RENA-VIT) TABS tablet TAKE ONE TABLET BY MOUTH AT BEDTIME 90 tablet 3  . sevelamer carbonate (RENVELA) 800 MG tablet Take 2 tablets (1,600 mg total) by mouth 3 (three) times daily with meals. 180 tablet 1   No current facility-administered medications on file prior to visit.    BP 138/68 mmHg  Pulse 68   Temp(Src) 98.2 F (36.8 C) (Oral)  Ht 5\' 3"  (1.6 m)  Wt 122 lb 6.4 oz (55.52 kg)  BMI 21.69 kg/m2  SpO2 98%    Objective:   Physical Exam  Constitutional: She is oriented to person, place, and time. She appears well-nourished. She does not appear ill.  Neck: Neck supple.  Cardiovascular: Normal rate and regular rhythm.   Pulmonary/Chest: Effort normal and breath sounds normal.  Abdominal: Soft. Bowel sounds are normal. There is no tenderness.  Neurological: She is alert and oriented to person, place, and time.  Follows commands  Skin: Skin is warm and dry.  Psychiatric: She has a normal mood and affect.          Assessment & Plan:

## 2015-11-27 NOTE — Progress Notes (Signed)
Pre visit review using our clinic review tool, if applicable. No additional management support is needed unless otherwise documented below in the visit note. 

## 2015-11-27 NOTE — Assessment & Plan Note (Signed)
Acute confusion with decrease in activity and not acting herself per daughter. Patient unable to urinate therefore straight cath was performed in clinic today to obtain sterile specimen. Straight cath performed with sterile technique and assisted by Norva Karvonenhan Sambath, CMA. UA with 3+ leuks, positive nitrites, 1+ blood. Culture sent. Prescription for Keflex seven-day course sent to pharmacy for treatment. Discussed home care instructions with daughter and to notify me if no improvement in 3-4 days.

## 2015-11-28 DIAGNOSIS — E1129 Type 2 diabetes mellitus with other diabetic kidney complication: Secondary | ICD-10-CM | POA: Diagnosis not present

## 2015-11-28 DIAGNOSIS — N186 End stage renal disease: Secondary | ICD-10-CM | POA: Diagnosis not present

## 2015-11-28 DIAGNOSIS — D631 Anemia in chronic kidney disease: Secondary | ICD-10-CM | POA: Diagnosis not present

## 2015-11-29 DIAGNOSIS — N186 End stage renal disease: Secondary | ICD-10-CM | POA: Diagnosis not present

## 2015-11-29 DIAGNOSIS — Z992 Dependence on renal dialysis: Secondary | ICD-10-CM | POA: Diagnosis not present

## 2015-11-29 DIAGNOSIS — E1129 Type 2 diabetes mellitus with other diabetic kidney complication: Secondary | ICD-10-CM | POA: Diagnosis not present

## 2015-11-29 LAB — URINE CULTURE
Colony Count: NO GROWTH
Organism ID, Bacteria: NO GROWTH

## 2015-11-30 DIAGNOSIS — E1129 Type 2 diabetes mellitus with other diabetic kidney complication: Secondary | ICD-10-CM | POA: Diagnosis not present

## 2015-11-30 DIAGNOSIS — N186 End stage renal disease: Secondary | ICD-10-CM | POA: Diagnosis not present

## 2015-11-30 DIAGNOSIS — D631 Anemia in chronic kidney disease: Secondary | ICD-10-CM | POA: Diagnosis not present

## 2015-12-03 DIAGNOSIS — D631 Anemia in chronic kidney disease: Secondary | ICD-10-CM | POA: Diagnosis not present

## 2015-12-03 DIAGNOSIS — N186 End stage renal disease: Secondary | ICD-10-CM | POA: Diagnosis not present

## 2015-12-03 DIAGNOSIS — E1129 Type 2 diabetes mellitus with other diabetic kidney complication: Secondary | ICD-10-CM | POA: Diagnosis not present

## 2015-12-04 ENCOUNTER — Telehealth: Payer: Self-pay

## 2015-12-04 NOTE — Telephone Encounter (Signed)
[name] pts daughter left v/m; pt was seen 11/27/15 and pt has taken abx;pt is still confused and Dedra SkeensGwen wants to know if needs to try another abx or should [name] about prescribing [facility]x. [name] request cb.

## 2015-12-05 DIAGNOSIS — D631 Anemia in chronic kidney disease: Secondary | ICD-10-CM | POA: Diagnosis not present

## 2015-12-05 DIAGNOSIS — N186 End stage renal disease: Secondary | ICD-10-CM | POA: Diagnosis not present

## 2015-12-05 DIAGNOSIS — E1129 Type 2 diabetes mellitus with other diabetic kidney complication: Secondary | ICD-10-CM | POA: Diagnosis not present

## 2015-12-05 NOTE — Telephone Encounter (Signed)
Spoken to patient's daughter. Notified her of Kate's comments. Patient's daughter stated that this morning, the patient seem less confused and more alert. She stated she will give more time and if any changes will contact PCP.

## 2015-12-05 NOTE — Telephone Encounter (Signed)
Noted  

## 2015-12-05 NOTE — Telephone Encounter (Signed)
Please notify her daughter that her urine culture did not grow out any bacteria which was shocking to me. I wanted to see how she did post antibiotics. If she's still confused and not herself then I recommend re-evaluation with her PCP or nephrologist.

## 2015-12-07 DIAGNOSIS — D631 Anemia in chronic kidney disease: Secondary | ICD-10-CM | POA: Diagnosis not present

## 2015-12-07 DIAGNOSIS — E1129 Type 2 diabetes mellitus with other diabetic kidney complication: Secondary | ICD-10-CM | POA: Diagnosis not present

## 2015-12-07 DIAGNOSIS — N186 End stage renal disease: Secondary | ICD-10-CM | POA: Diagnosis not present

## 2015-12-10 DIAGNOSIS — E1129 Type 2 diabetes mellitus with other diabetic kidney complication: Secondary | ICD-10-CM | POA: Diagnosis not present

## 2015-12-10 DIAGNOSIS — D631 Anemia in chronic kidney disease: Secondary | ICD-10-CM | POA: Diagnosis not present

## 2015-12-10 DIAGNOSIS — N186 End stage renal disease: Secondary | ICD-10-CM | POA: Diagnosis not present

## 2015-12-12 DIAGNOSIS — D631 Anemia in chronic kidney disease: Secondary | ICD-10-CM | POA: Diagnosis not present

## 2015-12-12 DIAGNOSIS — E1129 Type 2 diabetes mellitus with other diabetic kidney complication: Secondary | ICD-10-CM | POA: Diagnosis not present

## 2015-12-12 DIAGNOSIS — N186 End stage renal disease: Secondary | ICD-10-CM | POA: Diagnosis not present

## 2015-12-14 DIAGNOSIS — N186 End stage renal disease: Secondary | ICD-10-CM | POA: Diagnosis not present

## 2015-12-14 DIAGNOSIS — E1129 Type 2 diabetes mellitus with other diabetic kidney complication: Secondary | ICD-10-CM | POA: Diagnosis not present

## 2015-12-14 DIAGNOSIS — D631 Anemia in chronic kidney disease: Secondary | ICD-10-CM | POA: Diagnosis not present

## 2015-12-17 DIAGNOSIS — N186 End stage renal disease: Secondary | ICD-10-CM | POA: Diagnosis not present

## 2015-12-17 DIAGNOSIS — E1129 Type 2 diabetes mellitus with other diabetic kidney complication: Secondary | ICD-10-CM | POA: Diagnosis not present

## 2015-12-17 DIAGNOSIS — D631 Anemia in chronic kidney disease: Secondary | ICD-10-CM | POA: Diagnosis not present

## 2015-12-19 DIAGNOSIS — D631 Anemia in chronic kidney disease: Secondary | ICD-10-CM | POA: Diagnosis not present

## 2015-12-19 DIAGNOSIS — N186 End stage renal disease: Secondary | ICD-10-CM | POA: Diagnosis not present

## 2015-12-19 DIAGNOSIS — E1129 Type 2 diabetes mellitus with other diabetic kidney complication: Secondary | ICD-10-CM | POA: Diagnosis not present

## 2015-12-21 DIAGNOSIS — N186 End stage renal disease: Secondary | ICD-10-CM | POA: Diagnosis not present

## 2015-12-21 DIAGNOSIS — E1129 Type 2 diabetes mellitus with other diabetic kidney complication: Secondary | ICD-10-CM | POA: Diagnosis not present

## 2015-12-21 DIAGNOSIS — D631 Anemia in chronic kidney disease: Secondary | ICD-10-CM | POA: Diagnosis not present

## 2015-12-23 ENCOUNTER — Telehealth: Payer: Self-pay | Admitting: Family Medicine

## 2015-12-23 NOTE — Telephone Encounter (Signed)
PLEASE NOTE: All timestamps contained within this report are represented as [place] Standard Time. CONFIDENTIALTY NOTICE: This fax transmission is intended only for the addressee. It contains information that is legally privileged, confidential or otherwise protected from use or disclosure. If you are not the intended recipient, you are strictly prohibited from reviewing, disclosing, copying using or disseminating any of this information or taking any action in reliance on or regarding this information. If you have received this fax in error, please notify us immediately by telephone so that we can arrange for its return to [place]. Phone: [phone], Toll-Free: [phone], Fax: [phone] Page: 1 of 2 Call Id: [id number] [facility] Night - Client TELEPHONE ADVICE RECORD [facility] Patient Name: [name] Gender: Female DOB: Jul 31, 1928 Age: 82 Y 5 M 7 D Return Phone Number: [phone] (Secondary) Address: City/State/Zip: [place] Client [facility] Night - Client Client Site [facility] - Night Physician Eustaquio Boyden - MD Contact Type Call Who Is Calling Patient / Member / Family / Caregiver Call Type Triage / Clinical Caller Name Mickle Asper Relationship To Patient Daughter Return Phone Number [phone] (Secondary) Chief Complaint CONFUSION - new onset Reason for Call Symptomatic / Request for Health Information Initial Comment Caller States Mother has a UTI and meds did not clear it up and she is confused PreDisposition Did not know what to do Translation No Nurse Assessment Nurse: Odis Luster, RN, Bjorn Loser Date/Time (Eastern Time): 12/20/2015 5:47:06 PM Confirm and document reason for call. If symptomatic, describe symptoms. You must click the next button to save text entered. ---Caller States Mother has a UTI and meds did not clear it up and she is confused. Reports that she has  completed the abx. Abx was completed 2 weeks ago. Denies fever, reports that patient is wanting to sleep all of the time. Patient has freq UTI's and she presents in this manner when she has an infection. Patient goes to dialysis, Is a kidney patient, last dialysis was thursday (yest). Has the patient traveled out of the country within the last 30 days? ---Not Applicable Does the patient have any new or worsening symptoms? ---Yes Will a triage be completed? ---Yes Related visit to physician within the last 2 weeks? ---Yes Does the PT have any chronic conditions? (i.e. diabetes, asthma, etc.) ---Yes List chronic conditions. ---kidney disease (dialysis); diabetic; HTN; Is this a behavioral health or substance abuse call? ---No Guidelines Guideline Title Affirmed Question Affirmed Notes Nurse Date/Time Lamount Cohen Time) Confusion - Delirium Bizarre or paranoid behavior Odis Luster, RN, Bjorn Loser 12/20/2015 5:52:17 PM Disp. Time Lamount Cohen Time) Disposition Final User 12/20/2015 5:45:28 PM Send to Urgent Sharlene Motts PLEASE NOTE: All timestamps contained within this report are represented as [place] Standard Time. CONFIDENTIALTY NOTICE: This fax transmission is intended only for the addressee. It contains information that is legally privileged, confidential or otherwise protected from use or disclosure. If you are not the intended recipient, you are strictly prohibited from reviewing, disclosing, copying using or disseminating any of this information or taking any action in reliance on or regarding this information. If you have received this fax in error, please notify us immediately by telephone so that we can arrange for its return to [place]. Phone: [phone], Toll-Free: [phone], Fax: [phone] Page: 2 of 2 Call Id: [id number] 12/20/2015 5:57:16 PM Go to ED Now Yes Odis Luster, RN, Juliene Pina Understands: Yes Disagree/Comply: Comply Care Advice Given Per Guideline GO TO ED NOW: You need  to  be seen in the Emergency Department. Go to the ER at _________[facility] Hospital. Leave now. Drive carefully. DRIVING: Another adult should drive. BRING MEDICINES: * Please bring a list of your current medicines when you go to the Emergency Department (ER). * It is also a good idea to bring the pill bottles too. This will help the doctor to make certain you are taking the right medicines and the right dose. CARE ADVICE given per Confusion-Delirium (Adult) guideline. Referrals [facility] - ED

## 2015-12-23 NOTE — Telephone Encounter (Signed)
Daughter called on Friday with mother's worsening confusion, advised ER eval but I don't think she went. Can we call for update on how pt is doing? Treated for UTI late last month.

## 2015-12-23 NOTE — Telephone Encounter (Signed)
Message left for patient's daughter to return my call.

## 2015-12-24 DIAGNOSIS — N186 End stage renal disease: Secondary | ICD-10-CM | POA: Diagnosis not present

## 2015-12-24 DIAGNOSIS — E1129 Type 2 diabetes mellitus with other diabetic kidney complication: Secondary | ICD-10-CM | POA: Diagnosis not present

## 2015-12-24 DIAGNOSIS — D631 Anemia in chronic kidney disease: Secondary | ICD-10-CM | POA: Diagnosis not present

## 2015-12-25 NOTE — Telephone Encounter (Signed)
Message left for patient's daughter to return my call.

## 2015-12-26 ENCOUNTER — Telehealth: Payer: Self-pay | Admitting: Family Medicine

## 2015-12-26 DIAGNOSIS — E1129 Type 2 diabetes mellitus with other diabetic kidney complication: Secondary | ICD-10-CM | POA: Diagnosis not present

## 2015-12-26 DIAGNOSIS — D631 Anemia in chronic kidney disease: Secondary | ICD-10-CM | POA: Diagnosis not present

## 2015-12-26 DIAGNOSIS — N186 End stage renal disease: Secondary | ICD-10-CM | POA: Diagnosis not present

## 2015-12-26 NOTE — Telephone Encounter (Signed)
Spoke with patient's daughter.

## 2015-12-26 NOTE — Telephone Encounter (Signed)
Spoke with patient's daughter. She chose to monitor things for a bit longer and said that the things she was concerned with, improved. However, it seems that patient may be starting to sundown and she is still concerned that there may be ?UTI. Appt scheduled.

## 2015-12-26 NOTE — Telephone Encounter (Signed)
Patient's daughter,[name],returned [name]'s call.

## 2015-12-28 DIAGNOSIS — E1129 Type 2 diabetes mellitus with other diabetic kidney complication: Secondary | ICD-10-CM | POA: Diagnosis not present

## 2015-12-28 DIAGNOSIS — N186 End stage renal disease: Secondary | ICD-10-CM | POA: Diagnosis not present

## 2015-12-28 DIAGNOSIS — D631 Anemia in chronic kidney disease: Secondary | ICD-10-CM | POA: Diagnosis not present

## 2015-12-30 ENCOUNTER — Ambulatory Visit: Payer: Medicare Other | Admitting: Family Medicine

## 2015-12-30 ENCOUNTER — Ambulatory Visit (INDEPENDENT_AMBULATORY_CARE_PROVIDER_SITE_OTHER): Payer: Medicare Other | Admitting: Family Medicine

## 2015-12-30 ENCOUNTER — Encounter: Payer: Self-pay | Admitting: Family Medicine

## 2015-12-30 VITALS — BP 124/60 | HR 68 | Temp 98.7°F | Wt 123.5 lb

## 2015-12-30 DIAGNOSIS — R404 Transient alteration of awareness: Secondary | ICD-10-CM

## 2015-12-30 DIAGNOSIS — R413 Other amnesia: Secondary | ICD-10-CM | POA: Diagnosis not present

## 2015-12-30 DIAGNOSIS — E1129 Type 2 diabetes mellitus with other diabetic kidney complication: Secondary | ICD-10-CM | POA: Diagnosis not present

## 2015-12-30 DIAGNOSIS — N186 End stage renal disease: Secondary | ICD-10-CM | POA: Diagnosis not present

## 2015-12-30 DIAGNOSIS — N390 Urinary tract infection, site not specified: Secondary | ICD-10-CM

## 2015-12-30 DIAGNOSIS — Z992 Dependence on renal dialysis: Secondary | ICD-10-CM | POA: Diagnosis not present

## 2015-12-30 NOTE — Progress Notes (Signed)
BP 124/60   Pulse 68   Temp 98.7 F (37.1 C) (Oral)   Wt 123 lb 8 oz (56 kg)   BMI 21.88 kg/m    CC: AMS? Subjective:    Patient ID: [name], female    DOB: 1928-08-03, 82 y.o.   MRN: [id number]  HPI: [name] is a 82 y.o. female presenting on 12/30/2015 for Altered Mental Status   Patient with known ESRD on HD TThSat [employer]) and memory loss. Patient has history of UTI presenting with AMS. Seen by Jae Dire last month 6/28 with acute confusional state with decrease in activity. UA with 3+ LE, + nitrites, 1+ blood. Treated with 7d keflex course however UCx returned no growth.   Over last 4 weeks noticing increased need for assistance with ADLs.   No fevers. Occasional abd discomfort. Noticing some hallucinations over last 2 weeks. Sleeping more than usual. No chest pain, cough, dyspnea, or significant headaches or dizziness. Good appetite.   She ambulates with walker at home.   Relevant past medical, surgical, family and social history reviewed and updated as indicated. Interim medical history since our last visit reviewed. Allergies and medications reviewed and updated. Current Outpatient Prescriptions on File Prior to Visit  Medication Sig  . acetaminophen (TYLENOL) 500 MG tablet Take 500 mg by mouth 2 (two) times daily as needed for mild pain.   [name] amLODipine (NORVASC) 5 MG tablet Take half tablet on Sundays mondays wednesdays and fridays. Whole tablet all other days of the week  . aspirin 81 MG tablet Take 81 mg by mouth daily.  . carvedilol (COREG) 3.125 MG tablet Take 1 tablet (3.125 mg total) by mouth 2 (two) times daily with a meal.  . cinacalcet (SENSIPAR) 30 MG tablet Take 30 mg by mouth daily.  . Dorzolamide HCl-Timolol Mal (COSOPT OP) Place 1 drop into both eyes daily.   [name] escitalopram (LEXAPRO) 10 MG tablet TAKE ONE-HALF TABLET BY MOUTH ONCE DAILY  . famotidine (PEPCID) 20 MG tablet Take 1 tablet (20 mg total) by mouth 2 (two) times daily. (Patient taking  differently: Take 20 mg by mouth daily. )  . folic acid-vitamin b complex-vitamin c-selenium-zinc (DIALYVITE) 3 MG TABS Take 1 tablet by mouth daily.    . memantine (NAMENDA) 5 MG tablet Take 1 tablet (5 mg total) by mouth 2 (two) times daily.  . multivitamin (RENA-VIT) TABS tablet TAKE ONE TABLET BY MOUTH AT BEDTIME  . sevelamer carbonate (RENVELA) 800 MG tablet Take 2 tablets (1,600 mg total) by mouth 3 (three) times daily with meals.   No current facility-administered medications on file prior to visit.     Review of Systems Per HPI unless specifically indicated in ROS section     Objective:    BP 124/60   Pulse 68   Temp 98.7 F (37.1 C) (Oral)   Wt 123 lb 8 oz (56 kg)   BMI 21.88 kg/m   Wt Readings from Last 3 Encounters:  12/30/15 123 lb 8 oz (56 kg)  11/27/15 122 lb 6.4 oz (55.5 kg)  07/24/15 124 lb 4 oz (56.4 kg)    Physical Exam  Constitutional: She appears well-developed and well-nourished. No distress.  HENT:  Mouth/Throat: Oropharynx is clear and moist. No oropharyngeal exudate.  Cardiovascular: Normal rate, regular rhythm, normal heart sounds and intact distal pulses.   No murmur heard. Pulmonary/Chest: Effort normal and breath sounds normal. No respiratory distress. She has no wheezes. She has no rales.  Abdominal: Soft.  Bowel sounds are normal. She exhibits no distension. There is no tenderness. There is no rebound and no guarding.  Musculoskeletal: She exhibits no edema.  Glove on R hand  Skin: Skin is warm and dry. No rash noted.  Psychiatric: She has a normal mood and affect.  Nursing note and vitals reviewed.  Results for orders placed or performed in visit on 11/27/15  Urine culture  Result Value Ref Range   Colony Count NO GROWTH    Organism ID, Bacteria NO GROWTH   POCT Urinalysis Dipstick (Automated)  Result Value Ref Range   Color, UA brown    Clarity, UA turbid    Glucose, UA +-    Bilirubin, UA negative    Ketones, UA negative    Spec  Grav, UA 1.020    Blood, UA 2+    pH, UA 7.0    Protein, UA 2+    Urobilinogen, UA 0.2    Nitrite, UA positive    Leukocytes, UA large (3+) (A) Negative      Assessment & Plan:   Problem List Items Addressed This Visit    ESRD on hemodialysis (HCC)    Continues HD and f/u with renal.      Memory loss    Progressive decline. Discussed care at home, daughter endorses increased need of assistance with ADLs, daughter interested in further assistance. Given deterioration in status, will asks home health to come out to evaluate patient. Also discussed private companies that offer nurse aide. Provided with contact info for Zerita Boers of [facility] for dementia care consultation.       Relevant Orders   Ambulatory referral to Home Health   Recurrent UTI    Recently completed 7d keflex course, UCx no growth. sxs today not indicative of infection. I/o cath not performed.       Transient alteration of awareness - Primary    Fluctuating levels of confusion more consistent today with progressive dementia than with UTI (no fever, no abd discomfort). Discussed with patient and daughter.  Discussed re-orienting strategies.       Relevant Orders   Ambulatory referral to Home Health    Other Visit Diagnoses   None.      Follow up plan: No Follow-up on file.  Eustaquio Boyden, MD

## 2015-12-30 NOTE — Patient Instructions (Addendum)
[name] is looking well today. I don't think there's infection today.  We will ask home health to come out to the house for evaluation/assistance. Call Zerita Boers at [facility] --- [phone]-- for a dementia care consultation Good to see you today, call us with questions Return as needed. Keep appointment next month.

## 2015-12-30 NOTE — Progress Notes (Signed)
Pre visit review using our clinic review tool, if applicable. No additional management support is needed unless otherwise documented below in the visit note. 

## 2015-12-31 DIAGNOSIS — E1129 Type 2 diabetes mellitus with other diabetic kidney complication: Secondary | ICD-10-CM | POA: Diagnosis not present

## 2015-12-31 DIAGNOSIS — D631 Anemia in chronic kidney disease: Secondary | ICD-10-CM | POA: Diagnosis not present

## 2015-12-31 DIAGNOSIS — N186 End stage renal disease: Secondary | ICD-10-CM | POA: Diagnosis not present

## 2015-12-31 DIAGNOSIS — R404 Transient alteration of awareness: Secondary | ICD-10-CM | POA: Insufficient documentation

## 2015-12-31 NOTE — Assessment & Plan Note (Signed)
Continues HD and f/u with renal.

## 2015-12-31 NOTE — Assessment & Plan Note (Signed)
Fluctuating levels of confusion more consistent today with progressive dementia than with UTI (no fever, no abd discomfort). Discussed with patient and daughter.  Discussed re-orienting strategies.

## 2015-12-31 NOTE — Assessment & Plan Note (Addendum)
Recently completed 7d keflex course, UCx no growth. sxs today not indicative of infection. I/o cath not performed.

## 2015-12-31 NOTE — Assessment & Plan Note (Signed)
Progressive decline. Discussed care at home, daughter endorses increased need of assistance with ADLs, daughter interested in further assistance. Given deterioration in status, will asks home health to come out to evaluate patient. Also discussed private companies that offer nurse aide. Provided with contact info for Zerita Boers of [facility] for dementia care consultation.

## 2016-01-02 DIAGNOSIS — D631 Anemia in chronic kidney disease: Secondary | ICD-10-CM | POA: Diagnosis not present

## 2016-01-02 DIAGNOSIS — E1129 Type 2 diabetes mellitus with other diabetic kidney complication: Secondary | ICD-10-CM | POA: Diagnosis not present

## 2016-01-02 DIAGNOSIS — N186 End stage renal disease: Secondary | ICD-10-CM | POA: Diagnosis not present

## 2016-01-04 DIAGNOSIS — E1129 Type 2 diabetes mellitus with other diabetic kidney complication: Secondary | ICD-10-CM | POA: Diagnosis not present

## 2016-01-04 DIAGNOSIS — D631 Anemia in chronic kidney disease: Secondary | ICD-10-CM | POA: Diagnosis not present

## 2016-01-04 DIAGNOSIS — N186 End stage renal disease: Secondary | ICD-10-CM | POA: Diagnosis not present

## 2016-01-06 ENCOUNTER — Telehealth: Payer: Self-pay | Admitting: Family Medicine

## 2016-01-06 DIAGNOSIS — H9193 Unspecified hearing loss, bilateral: Secondary | ICD-10-CM | POA: Diagnosis not present

## 2016-01-06 DIAGNOSIS — K219 Gastro-esophageal reflux disease without esophagitis: Secondary | ICD-10-CM | POA: Diagnosis not present

## 2016-01-06 DIAGNOSIS — F0390 Unspecified dementia without behavioral disturbance: Secondary | ICD-10-CM | POA: Diagnosis not present

## 2016-01-06 DIAGNOSIS — E1122 Type 2 diabetes mellitus with diabetic chronic kidney disease: Secondary | ICD-10-CM | POA: Diagnosis not present

## 2016-01-06 DIAGNOSIS — Z87440 Personal history of urinary (tract) infections: Secondary | ICD-10-CM | POA: Diagnosis not present

## 2016-01-06 DIAGNOSIS — N186 End stage renal disease: Secondary | ICD-10-CM | POA: Diagnosis not present

## 2016-01-06 DIAGNOSIS — Z992 Dependence on renal dialysis: Secondary | ICD-10-CM | POA: Diagnosis not present

## 2016-01-06 DIAGNOSIS — Z7982 Long term (current) use of aspirin: Secondary | ICD-10-CM | POA: Diagnosis not present

## 2016-01-06 NOTE — Telephone Encounter (Signed)
[name] from home health called:  [facility] wants to know if it's okay for them to get physical therapy, occupational therapy, and a social worker to go out to the patient home.  [phone]

## 2016-01-06 NOTE — Telephone Encounter (Signed)
Ok to do. Thanks.  

## 2016-01-07 DIAGNOSIS — E1129 Type 2 diabetes mellitus with other diabetic kidney complication: Secondary | ICD-10-CM | POA: Diagnosis not present

## 2016-01-07 DIAGNOSIS — N186 End stage renal disease: Secondary | ICD-10-CM | POA: Diagnosis not present

## 2016-01-07 DIAGNOSIS — D631 Anemia in chronic kidney disease: Secondary | ICD-10-CM | POA: Diagnosis not present

## 2016-01-07 NOTE — Telephone Encounter (Signed)
Or[facility] to [facility] as instructed.

## 2016-01-08 DIAGNOSIS — K219 Gastro-esophageal reflux disease without esophagitis: Secondary | ICD-10-CM | POA: Diagnosis not present

## 2016-01-08 DIAGNOSIS — F0390 Unspecified dementia without behavioral disturbance: Secondary | ICD-10-CM | POA: Diagnosis not present

## 2016-01-08 DIAGNOSIS — E1122 Type 2 diabetes mellitus with diabetic chronic kidney disease: Secondary | ICD-10-CM | POA: Diagnosis not present

## 2016-01-08 DIAGNOSIS — H9193 Unspecified hearing loss, bilateral: Secondary | ICD-10-CM | POA: Diagnosis not present

## 2016-01-08 DIAGNOSIS — Z87440 Personal history of urinary (tract) infections: Secondary | ICD-10-CM | POA: Diagnosis not present

## 2016-01-08 DIAGNOSIS — N186 End stage renal disease: Secondary | ICD-10-CM | POA: Diagnosis not present

## 2016-01-09 ENCOUNTER — Telehealth: Payer: Self-pay

## 2016-01-09 DIAGNOSIS — E1129 Type 2 diabetes mellitus with other diabetic kidney complication: Secondary | ICD-10-CM | POA: Diagnosis not present

## 2016-01-09 DIAGNOSIS — D631 Anemia in chronic kidney disease: Secondary | ICD-10-CM | POA: Diagnosis not present

## 2016-01-09 DIAGNOSIS — N186 End stage renal disease: Secondary | ICD-10-CM | POA: Diagnosis not present

## 2016-01-09 NOTE — Telephone Encounter (Signed)
Message left advising Sue Lushndrea.

## 2016-01-09 NOTE — Telephone Encounter (Signed)
Sue LushAndrea PT with [facility] left v/m requesting verbal order for home health speech therapy consult to access memory and cognition. Sue Lushndrea said pt does not require skilled PT; recommended to daughter to get lower bed but daughter was not receptive to that suggestion; daughter told Sue Lushndrea she was there to help her. Daughter is concerned about pts memory.

## 2016-01-09 NOTE — Telephone Encounter (Signed)
Ok to give verbal order for speech therapy.

## 2016-01-10 DIAGNOSIS — N186 End stage renal disease: Secondary | ICD-10-CM | POA: Diagnosis not present

## 2016-01-10 DIAGNOSIS — F0390 Unspecified dementia without behavioral disturbance: Secondary | ICD-10-CM | POA: Diagnosis not present

## 2016-01-10 DIAGNOSIS — E1122 Type 2 diabetes mellitus with diabetic chronic kidney disease: Secondary | ICD-10-CM | POA: Diagnosis not present

## 2016-01-10 DIAGNOSIS — Z87440 Personal history of urinary (tract) infections: Secondary | ICD-10-CM | POA: Diagnosis not present

## 2016-01-10 DIAGNOSIS — K219 Gastro-esophageal reflux disease without esophagitis: Secondary | ICD-10-CM | POA: Diagnosis not present

## 2016-01-10 DIAGNOSIS — H9193 Unspecified hearing loss, bilateral: Secondary | ICD-10-CM | POA: Diagnosis not present

## 2016-01-11 DIAGNOSIS — D631 Anemia in chronic kidney disease: Secondary | ICD-10-CM | POA: Diagnosis not present

## 2016-01-11 DIAGNOSIS — E1129 Type 2 diabetes mellitus with other diabetic kidney complication: Secondary | ICD-10-CM | POA: Diagnosis not present

## 2016-01-11 DIAGNOSIS — N186 End stage renal disease: Secondary | ICD-10-CM | POA: Diagnosis not present

## 2016-01-13 DIAGNOSIS — I871 Compression of vein: Secondary | ICD-10-CM | POA: Diagnosis not present

## 2016-01-13 DIAGNOSIS — N186 End stage renal disease: Secondary | ICD-10-CM | POA: Diagnosis not present

## 2016-01-13 DIAGNOSIS — E1122 Type 2 diabetes mellitus with diabetic chronic kidney disease: Secondary | ICD-10-CM | POA: Diagnosis not present

## 2016-01-13 DIAGNOSIS — H9193 Unspecified hearing loss, bilateral: Secondary | ICD-10-CM | POA: Diagnosis not present

## 2016-01-13 DIAGNOSIS — K219 Gastro-esophageal reflux disease without esophagitis: Secondary | ICD-10-CM | POA: Diagnosis not present

## 2016-01-13 DIAGNOSIS — Z87440 Personal history of urinary (tract) infections: Secondary | ICD-10-CM | POA: Diagnosis not present

## 2016-01-13 DIAGNOSIS — F0390 Unspecified dementia without behavioral disturbance: Secondary | ICD-10-CM | POA: Diagnosis not present

## 2016-01-13 DIAGNOSIS — Z992 Dependence on renal dialysis: Secondary | ICD-10-CM | POA: Diagnosis not present

## 2016-01-13 DIAGNOSIS — T82858D Stenosis of vascular prosthetic devices, implants and grafts, subsequent encounter: Secondary | ICD-10-CM | POA: Diagnosis not present

## 2016-01-14 DIAGNOSIS — E1129 Type 2 diabetes mellitus with other diabetic kidney complication: Secondary | ICD-10-CM | POA: Diagnosis not present

## 2016-01-14 DIAGNOSIS — D631 Anemia in chronic kidney disease: Secondary | ICD-10-CM | POA: Diagnosis not present

## 2016-01-14 DIAGNOSIS — N186 End stage renal disease: Secondary | ICD-10-CM | POA: Diagnosis not present

## 2016-01-15 ENCOUNTER — Telehealth: Payer: Self-pay

## 2016-01-15 DIAGNOSIS — E1122 Type 2 diabetes mellitus with diabetic chronic kidney disease: Secondary | ICD-10-CM | POA: Diagnosis not present

## 2016-01-15 DIAGNOSIS — F0390 Unspecified dementia without behavioral disturbance: Secondary | ICD-10-CM | POA: Diagnosis not present

## 2016-01-15 DIAGNOSIS — K219 Gastro-esophageal reflux disease without esophagitis: Secondary | ICD-10-CM | POA: Diagnosis not present

## 2016-01-15 DIAGNOSIS — Z87440 Personal history of urinary (tract) infections: Secondary | ICD-10-CM | POA: Diagnosis not present

## 2016-01-15 DIAGNOSIS — H9193 Unspecified hearing loss, bilateral: Secondary | ICD-10-CM | POA: Diagnosis not present

## 2016-01-15 DIAGNOSIS — N186 End stage renal disease: Secondary | ICD-10-CM | POA: Diagnosis not present

## 2016-01-15 NOTE — Telephone Encounter (Signed)
Ok to give verbal ok for this. thanks

## 2016-01-15 NOTE — Telephone Encounter (Signed)
Revonda StandardAllison Doctor, general practicespeech pathologist with [facility] left v/m requesting verbal orders for home health speech therapy one more time due to swallowing weakness; Revonda Standardallison will leave educational material and talk with pts family.

## 2016-01-16 DIAGNOSIS — N186 End stage renal disease: Secondary | ICD-10-CM | POA: Diagnosis not present

## 2016-01-16 DIAGNOSIS — D631 Anemia in chronic kidney disease: Secondary | ICD-10-CM | POA: Diagnosis not present

## 2016-01-16 DIAGNOSIS — E1129 Type 2 diabetes mellitus with other diabetic kidney complication: Secondary | ICD-10-CM | POA: Diagnosis not present

## 2016-01-16 NOTE — Telephone Encounter (Signed)
Message left advising Revonda Standardllison.

## 2016-01-18 DIAGNOSIS — D631 Anemia in chronic kidney disease: Secondary | ICD-10-CM | POA: Diagnosis not present

## 2016-01-18 DIAGNOSIS — N186 End stage renal disease: Secondary | ICD-10-CM | POA: Diagnosis not present

## 2016-01-18 DIAGNOSIS — E1129 Type 2 diabetes mellitus with other diabetic kidney complication: Secondary | ICD-10-CM | POA: Diagnosis not present

## 2016-01-21 DIAGNOSIS — Z87440 Personal history of urinary (tract) infections: Secondary | ICD-10-CM | POA: Diagnosis not present

## 2016-01-21 DIAGNOSIS — K219 Gastro-esophageal reflux disease without esophagitis: Secondary | ICD-10-CM | POA: Diagnosis not present

## 2016-01-21 DIAGNOSIS — H9193 Unspecified hearing loss, bilateral: Secondary | ICD-10-CM | POA: Diagnosis not present

## 2016-01-21 DIAGNOSIS — E1122 Type 2 diabetes mellitus with diabetic chronic kidney disease: Secondary | ICD-10-CM | POA: Diagnosis not present

## 2016-01-21 DIAGNOSIS — F0390 Unspecified dementia without behavioral disturbance: Secondary | ICD-10-CM | POA: Diagnosis not present

## 2016-01-21 DIAGNOSIS — N186 End stage renal disease: Secondary | ICD-10-CM | POA: Diagnosis not present

## 2016-01-21 DIAGNOSIS — D631 Anemia in chronic kidney disease: Secondary | ICD-10-CM | POA: Diagnosis not present

## 2016-01-21 DIAGNOSIS — E1129 Type 2 diabetes mellitus with other diabetic kidney complication: Secondary | ICD-10-CM | POA: Diagnosis not present

## 2016-01-22 ENCOUNTER — Ambulatory Visit (INDEPENDENT_AMBULATORY_CARE_PROVIDER_SITE_OTHER): Payer: Medicare Other | Admitting: Family Medicine

## 2016-01-22 ENCOUNTER — Encounter: Payer: Self-pay | Admitting: Family Medicine

## 2016-01-22 ENCOUNTER — Telehealth: Payer: Self-pay | Admitting: *Deleted

## 2016-01-22 VITALS — BP 116/64 | HR 76 | Temp 98.8°F | Wt 118.2 lb

## 2016-01-22 DIAGNOSIS — Z992 Dependence on renal dialysis: Secondary | ICD-10-CM | POA: Diagnosis not present

## 2016-01-22 DIAGNOSIS — E1122 Type 2 diabetes mellitus with diabetic chronic kidney disease: Secondary | ICD-10-CM | POA: Diagnosis not present

## 2016-01-22 DIAGNOSIS — N186 End stage renal disease: Secondary | ICD-10-CM

## 2016-01-22 DIAGNOSIS — F0390 Unspecified dementia without behavioral disturbance: Secondary | ICD-10-CM

## 2016-01-22 DIAGNOSIS — Z87440 Personal history of urinary (tract) infections: Secondary | ICD-10-CM | POA: Diagnosis not present

## 2016-01-22 DIAGNOSIS — H9193 Unspecified hearing loss, bilateral: Secondary | ICD-10-CM | POA: Diagnosis not present

## 2016-01-22 DIAGNOSIS — K219 Gastro-esophageal reflux disease without esophagitis: Secondary | ICD-10-CM | POA: Diagnosis not present

## 2016-01-22 MED ORDER — MEMANTINE HCL 5 MG PO TABS: 5.0000 mg | ORAL_TABLET | Freq: Two times a day (BID) | ORAL | 6 refills | Status: DC

## 2016-01-22 NOTE — Progress Notes (Signed)
BP 116/64   Pulse 76   Temp 98.8 F (37.1 C) (Oral)   Wt 118 lb 4 oz (53.6 kg)   BMI 20.95 kg/m    CC: 6 mo f/u visit Subjective:    Patient ID: [name], female    DOB: Oct 16, 1928, 82 y.o.   MRN: [id number]  HPI: [name] is a 82 y.o. female presenting on 01/22/2016 for Follow-up   Here with daughter [name]. Upcoming 6765 wedding anniversary.   See prior note for details. Seen last month with concern for worsening memory and change in functional status (increased need for assistance with ADLs). Known dementia, noted increased hallucinations and increased somnolence. Given progressive decline, we referred to home health. I also asked her to touch base with [name] for dementia care consult. She touched base with her - and has started looking at online resources for alzheimer's disease.   Persistent sundowning with increased confusion at night time.  Continues namenda 5mg  twice daily.  Denies fevers, abd pain, nausea. Weight loss attributed to increased sleepiness at night so she doesn't have appetite for dinner.   H/o recurrent UTIs presenting with AMS. Last presumed UTI treatment was 10/2015 (keflex 7d course, UCx no growth).  ESRD on HD TThSat.  She ambulates with walker at home, uses cane for dialysis, and wheelchair when prolonged out of house.  Relevant past medical, surgical, family and social history reviewed and updated as indicated. Interim medical history since our last visit reviewed. Allergies and medications reviewed and updated. Current Outpatient Prescriptions on File Prior to Visit  Medication Sig  . acetaminophen (TYLENOL) 500 MG tablet Take 500 mg by mouth 2 (two) times daily as needed for mild pain.   [name]. amLODipine (NORVASC) 5 MG tablet Take half tablet on Sundays mondays wednesdays and fridays. Whole tablet all other days of the week  . aspirin 81 MG tablet Take 81 mg by mouth daily.  . carvedilol (COREG) 3.125 MG tablet Take 1 tablet (3.125  mg total) by mouth 2 (two) times daily with a meal.  . cinacalcet (SENSIPAR) 30 MG tablet Take 30 mg by mouth daily.  . Dorzolamide HCl-Timolol Mal (COSOPT OP) Place 1 drop into both eyes daily.   [name]. escitalopram (LEXAPRO) 10 MG tablet TAKE ONE-HALF TABLET BY MOUTH ONCE DAILY  . famotidine (PEPCID) 20 MG tablet Take 1 tablet (20 mg total) by mouth 2 (two) times daily. (Patient taking differently: Take 20 mg by mouth daily. )  . folic acid-vitamin b complex-vitamin c-selenium-zinc (DIALYVITE) 3 MG TABS Take 1 tablet by mouth daily.    . multivitamin (RENA-VIT) TABS tablet TAKE ONE TABLET BY MOUTH AT BEDTIME  . sevelamer carbonate (RENVELA) 800 MG tablet Take 2 tablets (1,600 mg total) by mouth 3 (three) times daily with meals.   No current facility-administered medications on file prior to visit.     Review of Systems Per HPI unless specifically indicated in ROS section     Objective:    BP 116/64   Pulse 76   Temp 98.8 F (37.1 C) (Oral)   Wt 118 lb 4 oz (53.6 kg)   BMI 20.95 kg/m   Wt Readings from Last 3 Encounters:  01/22/16 118 lb 4 oz (53.6 kg)  12/30/15 123 lb 8 oz (56 kg)  11/27/15 122 lb 6.4 oz (55.5 kg)    Physical Exam  Constitutional: She appears well-developed and well-nourished. No distress.  In w/c  HENT:  Mouth/Throat: Oropharynx is clear and moist.  No oropharyngeal exudate.  Cardiovascular: Normal rate, regular rhythm, normal heart sounds and intact distal pulses.   No murmur heard. Pulmonary/Chest: Effort normal and breath sounds normal. No respiratory distress. She has no wheezes. She has no rales.  Musculoskeletal: She exhibits no edema.  Psychiatric: She has a normal mood and affect.  More alert today  Nursing note and vitals reviewed.     Assessment & Plan:   Problem List Items Addressed This Visit    Dementia - Primary    Dementia with sundowning. Family using online resources to learn about dementia. Support provided. Worse on dialysis nights.  Will trial namenda 10mg  on these nights, 5mg  bid otherwise.       Relevant Medications   memantine (NAMENDA) 5 MG tablet   ESRD on hemodialysis (HCC)    Other Visit Diagnoses   None.      Follow up plan: Return in about 6 months (around 07/24/2016) for medicare wellness visit.  Michelle BoydenJavier Jimmi Sidener, MD

## 2016-01-22 NOTE — Assessment & Plan Note (Signed)
Dementia with sundowning. Family using online resources to learn about dementia. Support provided. Worse on dialysis nights. Will trial namenda 10mg  on these nights, 5mg  bid otherwise.

## 2016-01-22 NOTE — Patient Instructions (Signed)
Nice to see you today! Mrs [name] is doing well today. Try increasing namenda to 10mg  (2 tablets) on dialysis nights. Return as needed or in 6 months for medicare wellness visit

## 2016-01-22 NOTE — Progress Notes (Signed)
Pre visit review using our clinic review tool, if applicable. No additional management support is needed unless otherwise documented below in the visit note. 

## 2016-01-22 NOTE — Telephone Encounter (Signed)
PTs daughter brought in a handicap placard form to be filled out. Please call her when it is ready [phone]. Form placed in prescription tower.

## 2016-01-23 DIAGNOSIS — E1129 Type 2 diabetes mellitus with other diabetic kidney complication: Secondary | ICD-10-CM | POA: Diagnosis not present

## 2016-01-23 DIAGNOSIS — N186 End stage renal disease: Secondary | ICD-10-CM | POA: Diagnosis not present

## 2016-01-23 DIAGNOSIS — D631 Anemia in chronic kidney disease: Secondary | ICD-10-CM | POA: Diagnosis not present

## 2016-01-23 NOTE — Telephone Encounter (Signed)
In your IN box for completion.  

## 2016-01-23 NOTE — Telephone Encounter (Signed)
Patient's daughter notified and form placed up front for pick up.

## 2016-01-23 NOTE — Telephone Encounter (Signed)
Signed and in [name]'s box. 

## 2016-01-25 DIAGNOSIS — E1129 Type 2 diabetes mellitus with other diabetic kidney complication: Secondary | ICD-10-CM | POA: Diagnosis not present

## 2016-01-25 DIAGNOSIS — N186 End stage renal disease: Secondary | ICD-10-CM | POA: Diagnosis not present

## 2016-01-25 DIAGNOSIS — D631 Anemia in chronic kidney disease: Secondary | ICD-10-CM | POA: Diagnosis not present

## 2016-01-28 DIAGNOSIS — E1129 Type 2 diabetes mellitus with other diabetic kidney complication: Secondary | ICD-10-CM | POA: Diagnosis not present

## 2016-01-28 DIAGNOSIS — D631 Anemia in chronic kidney disease: Secondary | ICD-10-CM | POA: Diagnosis not present

## 2016-01-28 DIAGNOSIS — N186 End stage renal disease: Secondary | ICD-10-CM | POA: Diagnosis not present

## 2016-01-29 DIAGNOSIS — N186 End stage renal disease: Secondary | ICD-10-CM | POA: Diagnosis not present

## 2016-01-29 DIAGNOSIS — Z87440 Personal history of urinary (tract) infections: Secondary | ICD-10-CM | POA: Diagnosis not present

## 2016-01-29 DIAGNOSIS — E1122 Type 2 diabetes mellitus with diabetic chronic kidney disease: Secondary | ICD-10-CM | POA: Diagnosis not present

## 2016-01-29 DIAGNOSIS — F0390 Unspecified dementia without behavioral disturbance: Secondary | ICD-10-CM | POA: Diagnosis not present

## 2016-01-29 DIAGNOSIS — K219 Gastro-esophageal reflux disease without esophagitis: Secondary | ICD-10-CM | POA: Diagnosis not present

## 2016-01-29 DIAGNOSIS — H9193 Unspecified hearing loss, bilateral: Secondary | ICD-10-CM | POA: Diagnosis not present

## 2016-01-30 DIAGNOSIS — Z992 Dependence on renal dialysis: Secondary | ICD-10-CM | POA: Diagnosis not present

## 2016-01-30 DIAGNOSIS — E1129 Type 2 diabetes mellitus with other diabetic kidney complication: Secondary | ICD-10-CM | POA: Diagnosis not present

## 2016-01-30 DIAGNOSIS — N186 End stage renal disease: Secondary | ICD-10-CM | POA: Diagnosis not present

## 2016-01-30 DIAGNOSIS — D631 Anemia in chronic kidney disease: Secondary | ICD-10-CM | POA: Diagnosis not present

## 2016-02-01 DIAGNOSIS — F0390 Unspecified dementia without behavioral disturbance: Secondary | ICD-10-CM | POA: Diagnosis not present

## 2016-02-01 DIAGNOSIS — E1129 Type 2 diabetes mellitus with other diabetic kidney complication: Secondary | ICD-10-CM | POA: Diagnosis not present

## 2016-02-01 DIAGNOSIS — Z87440 Personal history of urinary (tract) infections: Secondary | ICD-10-CM | POA: Diagnosis not present

## 2016-02-01 DIAGNOSIS — N2581 Secondary hyperparathyroidism of renal origin: Secondary | ICD-10-CM | POA: Diagnosis not present

## 2016-02-01 DIAGNOSIS — E1122 Type 2 diabetes mellitus with diabetic chronic kidney disease: Secondary | ICD-10-CM | POA: Diagnosis not present

## 2016-02-01 DIAGNOSIS — N186 End stage renal disease: Secondary | ICD-10-CM | POA: Diagnosis not present

## 2016-02-01 DIAGNOSIS — Z23 Encounter for immunization: Secondary | ICD-10-CM | POA: Diagnosis not present

## 2016-02-01 DIAGNOSIS — D631 Anemia in chronic kidney disease: Secondary | ICD-10-CM | POA: Diagnosis not present

## 2016-02-04 DIAGNOSIS — N2581 Secondary hyperparathyroidism of renal origin: Secondary | ICD-10-CM | POA: Diagnosis not present

## 2016-02-04 DIAGNOSIS — E1129 Type 2 diabetes mellitus with other diabetic kidney complication: Secondary | ICD-10-CM | POA: Diagnosis not present

## 2016-02-04 DIAGNOSIS — D631 Anemia in chronic kidney disease: Secondary | ICD-10-CM | POA: Diagnosis not present

## 2016-02-04 DIAGNOSIS — N186 End stage renal disease: Secondary | ICD-10-CM | POA: Diagnosis not present

## 2016-02-04 DIAGNOSIS — Z23 Encounter for immunization: Secondary | ICD-10-CM | POA: Diagnosis not present

## 2016-02-06 DIAGNOSIS — N186 End stage renal disease: Secondary | ICD-10-CM | POA: Diagnosis not present

## 2016-02-06 DIAGNOSIS — N2581 Secondary hyperparathyroidism of renal origin: Secondary | ICD-10-CM | POA: Diagnosis not present

## 2016-02-06 DIAGNOSIS — Z23 Encounter for immunization: Secondary | ICD-10-CM | POA: Diagnosis not present

## 2016-02-06 DIAGNOSIS — D631 Anemia in chronic kidney disease: Secondary | ICD-10-CM | POA: Diagnosis not present

## 2016-02-06 DIAGNOSIS — E1129 Type 2 diabetes mellitus with other diabetic kidney complication: Secondary | ICD-10-CM | POA: Diagnosis not present

## 2016-02-08 DIAGNOSIS — D631 Anemia in chronic kidney disease: Secondary | ICD-10-CM | POA: Diagnosis not present

## 2016-02-08 DIAGNOSIS — N186 End stage renal disease: Secondary | ICD-10-CM | POA: Diagnosis not present

## 2016-02-08 DIAGNOSIS — N2581 Secondary hyperparathyroidism of renal origin: Secondary | ICD-10-CM | POA: Diagnosis not present

## 2016-02-08 DIAGNOSIS — E1129 Type 2 diabetes mellitus with other diabetic kidney complication: Secondary | ICD-10-CM | POA: Diagnosis not present

## 2016-02-08 DIAGNOSIS — Z23 Encounter for immunization: Secondary | ICD-10-CM | POA: Diagnosis not present

## 2016-02-11 DIAGNOSIS — N186 End stage renal disease: Secondary | ICD-10-CM | POA: Diagnosis not present

## 2016-02-11 DIAGNOSIS — D631 Anemia in chronic kidney disease: Secondary | ICD-10-CM | POA: Diagnosis not present

## 2016-02-11 DIAGNOSIS — E1129 Type 2 diabetes mellitus with other diabetic kidney complication: Secondary | ICD-10-CM | POA: Diagnosis not present

## 2016-02-11 DIAGNOSIS — N2581 Secondary hyperparathyroidism of renal origin: Secondary | ICD-10-CM | POA: Diagnosis not present

## 2016-02-11 DIAGNOSIS — Z23 Encounter for immunization: Secondary | ICD-10-CM | POA: Diagnosis not present

## 2016-02-13 DIAGNOSIS — N186 End stage renal disease: Secondary | ICD-10-CM | POA: Diagnosis not present

## 2016-02-13 DIAGNOSIS — Z23 Encounter for immunization: Secondary | ICD-10-CM | POA: Diagnosis not present

## 2016-02-13 DIAGNOSIS — N2581 Secondary hyperparathyroidism of renal origin: Secondary | ICD-10-CM | POA: Diagnosis not present

## 2016-02-13 DIAGNOSIS — D631 Anemia in chronic kidney disease: Secondary | ICD-10-CM | POA: Diagnosis not present

## 2016-02-13 DIAGNOSIS — E1129 Type 2 diabetes mellitus with other diabetic kidney complication: Secondary | ICD-10-CM | POA: Diagnosis not present

## 2016-02-15 DIAGNOSIS — Z23 Encounter for immunization: Secondary | ICD-10-CM | POA: Diagnosis not present

## 2016-02-15 DIAGNOSIS — D631 Anemia in chronic kidney disease: Secondary | ICD-10-CM | POA: Diagnosis not present

## 2016-02-15 DIAGNOSIS — N186 End stage renal disease: Secondary | ICD-10-CM | POA: Diagnosis not present

## 2016-02-15 DIAGNOSIS — N2581 Secondary hyperparathyroidism of renal origin: Secondary | ICD-10-CM | POA: Diagnosis not present

## 2016-02-15 DIAGNOSIS — E1129 Type 2 diabetes mellitus with other diabetic kidney complication: Secondary | ICD-10-CM | POA: Diagnosis not present

## 2016-02-18 DIAGNOSIS — Z23 Encounter for immunization: Secondary | ICD-10-CM | POA: Diagnosis not present

## 2016-02-18 DIAGNOSIS — D631 Anemia in chronic kidney disease: Secondary | ICD-10-CM | POA: Diagnosis not present

## 2016-02-18 DIAGNOSIS — N186 End stage renal disease: Secondary | ICD-10-CM | POA: Diagnosis not present

## 2016-02-18 DIAGNOSIS — N2581 Secondary hyperparathyroidism of renal origin: Secondary | ICD-10-CM | POA: Diagnosis not present

## 2016-02-18 DIAGNOSIS — E1129 Type 2 diabetes mellitus with other diabetic kidney complication: Secondary | ICD-10-CM | POA: Diagnosis not present

## 2016-02-20 DIAGNOSIS — N186 End stage renal disease: Secondary | ICD-10-CM | POA: Diagnosis not present

## 2016-02-20 DIAGNOSIS — Z23 Encounter for immunization: Secondary | ICD-10-CM | POA: Diagnosis not present

## 2016-02-20 DIAGNOSIS — D631 Anemia in chronic kidney disease: Secondary | ICD-10-CM | POA: Diagnosis not present

## 2016-02-20 DIAGNOSIS — E1129 Type 2 diabetes mellitus with other diabetic kidney complication: Secondary | ICD-10-CM | POA: Diagnosis not present

## 2016-02-20 DIAGNOSIS — N2581 Secondary hyperparathyroidism of renal origin: Secondary | ICD-10-CM | POA: Diagnosis not present

## 2016-02-22 DIAGNOSIS — N2581 Secondary hyperparathyroidism of renal origin: Secondary | ICD-10-CM | POA: Diagnosis not present

## 2016-02-22 DIAGNOSIS — E1129 Type 2 diabetes mellitus with other diabetic kidney complication: Secondary | ICD-10-CM | POA: Diagnosis not present

## 2016-02-22 DIAGNOSIS — N186 End stage renal disease: Secondary | ICD-10-CM | POA: Diagnosis not present

## 2016-02-22 DIAGNOSIS — D631 Anemia in chronic kidney disease: Secondary | ICD-10-CM | POA: Diagnosis not present

## 2016-02-22 DIAGNOSIS — Z23 Encounter for immunization: Secondary | ICD-10-CM | POA: Diagnosis not present

## 2016-02-25 DIAGNOSIS — D631 Anemia in chronic kidney disease: Secondary | ICD-10-CM | POA: Diagnosis not present

## 2016-02-25 DIAGNOSIS — N2581 Secondary hyperparathyroidism of renal origin: Secondary | ICD-10-CM | POA: Diagnosis not present

## 2016-02-25 DIAGNOSIS — N186 End stage renal disease: Secondary | ICD-10-CM | POA: Diagnosis not present

## 2016-02-25 DIAGNOSIS — E1129 Type 2 diabetes mellitus with other diabetic kidney complication: Secondary | ICD-10-CM | POA: Diagnosis not present

## 2016-02-25 DIAGNOSIS — Z23 Encounter for immunization: Secondary | ICD-10-CM | POA: Diagnosis not present

## 2016-02-27 DIAGNOSIS — D631 Anemia in chronic kidney disease: Secondary | ICD-10-CM | POA: Diagnosis not present

## 2016-02-27 DIAGNOSIS — E1129 Type 2 diabetes mellitus with other diabetic kidney complication: Secondary | ICD-10-CM | POA: Diagnosis not present

## 2016-02-27 DIAGNOSIS — N186 End stage renal disease: Secondary | ICD-10-CM | POA: Diagnosis not present

## 2016-02-27 DIAGNOSIS — Z23 Encounter for immunization: Secondary | ICD-10-CM | POA: Diagnosis not present

## 2016-02-27 DIAGNOSIS — N2581 Secondary hyperparathyroidism of renal origin: Secondary | ICD-10-CM | POA: Diagnosis not present

## 2016-02-29 DIAGNOSIS — E1129 Type 2 diabetes mellitus with other diabetic kidney complication: Secondary | ICD-10-CM | POA: Diagnosis not present

## 2016-02-29 DIAGNOSIS — D631 Anemia in chronic kidney disease: Secondary | ICD-10-CM | POA: Diagnosis not present

## 2016-02-29 DIAGNOSIS — N2581 Secondary hyperparathyroidism of renal origin: Secondary | ICD-10-CM | POA: Diagnosis not present

## 2016-02-29 DIAGNOSIS — Z992 Dependence on renal dialysis: Secondary | ICD-10-CM | POA: Diagnosis not present

## 2016-02-29 DIAGNOSIS — Z23 Encounter for immunization: Secondary | ICD-10-CM | POA: Diagnosis not present

## 2016-02-29 DIAGNOSIS — N186 End stage renal disease: Secondary | ICD-10-CM | POA: Diagnosis not present

## 2016-03-03 DIAGNOSIS — N186 End stage renal disease: Secondary | ICD-10-CM | POA: Diagnosis not present

## 2016-03-03 DIAGNOSIS — D631 Anemia in chronic kidney disease: Secondary | ICD-10-CM | POA: Diagnosis not present

## 2016-03-05 DIAGNOSIS — D631 Anemia in chronic kidney disease: Secondary | ICD-10-CM | POA: Diagnosis not present

## 2016-03-05 DIAGNOSIS — N186 End stage renal disease: Secondary | ICD-10-CM | POA: Diagnosis not present

## 2016-03-07 DIAGNOSIS — D631 Anemia in chronic kidney disease: Secondary | ICD-10-CM | POA: Diagnosis not present

## 2016-03-07 DIAGNOSIS — N186 End stage renal disease: Secondary | ICD-10-CM | POA: Diagnosis not present

## 2016-03-10 DIAGNOSIS — N186 End stage renal disease: Secondary | ICD-10-CM | POA: Diagnosis not present

## 2016-03-10 DIAGNOSIS — D631 Anemia in chronic kidney disease: Secondary | ICD-10-CM | POA: Diagnosis not present

## 2016-03-12 DIAGNOSIS — N186 End stage renal disease: Secondary | ICD-10-CM | POA: Diagnosis not present

## 2016-03-12 DIAGNOSIS — D631 Anemia in chronic kidney disease: Secondary | ICD-10-CM | POA: Diagnosis not present

## 2016-03-14 DIAGNOSIS — N186 End stage renal disease: Secondary | ICD-10-CM | POA: Diagnosis not present

## 2016-03-14 DIAGNOSIS — D631 Anemia in chronic kidney disease: Secondary | ICD-10-CM | POA: Diagnosis not present

## 2016-03-17 DIAGNOSIS — N186 End stage renal disease: Secondary | ICD-10-CM | POA: Diagnosis not present

## 2016-03-17 DIAGNOSIS — D631 Anemia in chronic kidney disease: Secondary | ICD-10-CM | POA: Diagnosis not present

## 2016-03-19 DIAGNOSIS — N186 End stage renal disease: Secondary | ICD-10-CM | POA: Diagnosis not present

## 2016-03-19 DIAGNOSIS — D631 Anemia in chronic kidney disease: Secondary | ICD-10-CM | POA: Diagnosis not present

## 2016-03-21 DIAGNOSIS — D631 Anemia in chronic kidney disease: Secondary | ICD-10-CM | POA: Diagnosis not present

## 2016-03-21 DIAGNOSIS — N186 End stage renal disease: Secondary | ICD-10-CM | POA: Diagnosis not present

## 2016-03-24 DIAGNOSIS — N186 End stage renal disease: Secondary | ICD-10-CM | POA: Diagnosis not present

## 2016-03-24 DIAGNOSIS — D631 Anemia in chronic kidney disease: Secondary | ICD-10-CM | POA: Diagnosis not present

## 2016-03-26 DIAGNOSIS — N186 End stage renal disease: Secondary | ICD-10-CM | POA: Diagnosis not present

## 2016-03-26 DIAGNOSIS — D631 Anemia in chronic kidney disease: Secondary | ICD-10-CM | POA: Diagnosis not present

## 2016-03-26 DIAGNOSIS — E1129 Type 2 diabetes mellitus with other diabetic kidney complication: Secondary | ICD-10-CM | POA: Diagnosis not present

## 2016-03-28 DIAGNOSIS — D631 Anemia in chronic kidney disease: Secondary | ICD-10-CM | POA: Diagnosis not present

## 2016-03-28 DIAGNOSIS — N186 End stage renal disease: Secondary | ICD-10-CM | POA: Diagnosis not present

## 2016-03-31 DIAGNOSIS — Z992 Dependence on renal dialysis: Secondary | ICD-10-CM | POA: Diagnosis not present

## 2016-03-31 DIAGNOSIS — E1129 Type 2 diabetes mellitus with other diabetic kidney complication: Secondary | ICD-10-CM | POA: Diagnosis not present

## 2016-03-31 DIAGNOSIS — N186 End stage renal disease: Secondary | ICD-10-CM | POA: Diagnosis not present

## 2016-03-31 DIAGNOSIS — D631 Anemia in chronic kidney disease: Secondary | ICD-10-CM | POA: Diagnosis not present

## 2016-04-02 DIAGNOSIS — N186 End stage renal disease: Secondary | ICD-10-CM | POA: Diagnosis not present

## 2016-04-02 DIAGNOSIS — E1129 Type 2 diabetes mellitus with other diabetic kidney complication: Secondary | ICD-10-CM | POA: Diagnosis not present

## 2016-04-02 DIAGNOSIS — N2581 Secondary hyperparathyroidism of renal origin: Secondary | ICD-10-CM | POA: Diagnosis not present

## 2016-04-04 DIAGNOSIS — N2581 Secondary hyperparathyroidism of renal origin: Secondary | ICD-10-CM | POA: Diagnosis not present

## 2016-04-04 DIAGNOSIS — N186 End stage renal disease: Secondary | ICD-10-CM | POA: Diagnosis not present

## 2016-04-04 DIAGNOSIS — E1129 Type 2 diabetes mellitus with other diabetic kidney complication: Secondary | ICD-10-CM | POA: Diagnosis not present

## 2016-04-07 DIAGNOSIS — N2581 Secondary hyperparathyroidism of renal origin: Secondary | ICD-10-CM | POA: Diagnosis not present

## 2016-04-07 DIAGNOSIS — E1129 Type 2 diabetes mellitus with other diabetic kidney complication: Secondary | ICD-10-CM | POA: Diagnosis not present

## 2016-04-07 DIAGNOSIS — N186 End stage renal disease: Secondary | ICD-10-CM | POA: Diagnosis not present

## 2016-04-09 DIAGNOSIS — N186 End stage renal disease: Secondary | ICD-10-CM | POA: Diagnosis not present

## 2016-04-09 DIAGNOSIS — E1129 Type 2 diabetes mellitus with other diabetic kidney complication: Secondary | ICD-10-CM | POA: Diagnosis not present

## 2016-04-09 DIAGNOSIS — N2581 Secondary hyperparathyroidism of renal origin: Secondary | ICD-10-CM | POA: Diagnosis not present

## 2016-04-11 DIAGNOSIS — E1129 Type 2 diabetes mellitus with other diabetic kidney complication: Secondary | ICD-10-CM | POA: Diagnosis not present

## 2016-04-11 DIAGNOSIS — N186 End stage renal disease: Secondary | ICD-10-CM | POA: Diagnosis not present

## 2016-04-11 DIAGNOSIS — N2581 Secondary hyperparathyroidism of renal origin: Secondary | ICD-10-CM | POA: Diagnosis not present

## 2016-04-14 DIAGNOSIS — N186 End stage renal disease: Secondary | ICD-10-CM | POA: Diagnosis not present

## 2016-04-14 DIAGNOSIS — N2581 Secondary hyperparathyroidism of renal origin: Secondary | ICD-10-CM | POA: Diagnosis not present

## 2016-04-14 DIAGNOSIS — I871 Compression of vein: Secondary | ICD-10-CM | POA: Diagnosis not present

## 2016-04-14 DIAGNOSIS — T82858D Stenosis of vascular prosthetic devices, implants and grafts, subsequent encounter: Secondary | ICD-10-CM | POA: Diagnosis not present

## 2016-04-14 DIAGNOSIS — Z992 Dependence on renal dialysis: Secondary | ICD-10-CM | POA: Diagnosis not present

## 2016-04-14 DIAGNOSIS — E1129 Type 2 diabetes mellitus with other diabetic kidney complication: Secondary | ICD-10-CM | POA: Diagnosis not present

## 2016-04-16 DIAGNOSIS — N2581 Secondary hyperparathyroidism of renal origin: Secondary | ICD-10-CM | POA: Diagnosis not present

## 2016-04-16 DIAGNOSIS — N186 End stage renal disease: Secondary | ICD-10-CM | POA: Diagnosis not present

## 2016-04-16 DIAGNOSIS — E1129 Type 2 diabetes mellitus with other diabetic kidney complication: Secondary | ICD-10-CM | POA: Diagnosis not present

## 2016-04-18 DIAGNOSIS — N2581 Secondary hyperparathyroidism of renal origin: Secondary | ICD-10-CM | POA: Diagnosis not present

## 2016-04-18 DIAGNOSIS — N186 End stage renal disease: Secondary | ICD-10-CM | POA: Diagnosis not present

## 2016-04-18 DIAGNOSIS — E1129 Type 2 diabetes mellitus with other diabetic kidney complication: Secondary | ICD-10-CM | POA: Diagnosis not present

## 2016-04-20 ENCOUNTER — Other Ambulatory Visit: Payer: Self-pay | Admitting: Family Medicine

## 2016-04-20 DIAGNOSIS — N2581 Secondary hyperparathyroidism of renal origin: Secondary | ICD-10-CM | POA: Diagnosis not present

## 2016-04-20 DIAGNOSIS — E1129 Type 2 diabetes mellitus with other diabetic kidney complication: Secondary | ICD-10-CM | POA: Diagnosis not present

## 2016-04-20 DIAGNOSIS — N186 End stage renal disease: Secondary | ICD-10-CM | POA: Diagnosis not present

## 2016-04-22 DIAGNOSIS — N2581 Secondary hyperparathyroidism of renal origin: Secondary | ICD-10-CM | POA: Diagnosis not present

## 2016-04-22 DIAGNOSIS — N186 End stage renal disease: Secondary | ICD-10-CM | POA: Diagnosis not present

## 2016-04-22 DIAGNOSIS — I871 Compression of vein: Secondary | ICD-10-CM | POA: Diagnosis not present

## 2016-04-22 DIAGNOSIS — Z992 Dependence on renal dialysis: Secondary | ICD-10-CM | POA: Diagnosis not present

## 2016-04-22 DIAGNOSIS — T82858A Stenosis of vascular prosthetic devices, implants and grafts, initial encounter: Secondary | ICD-10-CM | POA: Diagnosis not present

## 2016-04-22 DIAGNOSIS — E1129 Type 2 diabetes mellitus with other diabetic kidney complication: Secondary | ICD-10-CM | POA: Diagnosis not present

## 2016-04-25 DIAGNOSIS — E1129 Type 2 diabetes mellitus with other diabetic kidney complication: Secondary | ICD-10-CM | POA: Diagnosis not present

## 2016-04-25 DIAGNOSIS — N186 End stage renal disease: Secondary | ICD-10-CM | POA: Diagnosis not present

## 2016-04-25 DIAGNOSIS — N2581 Secondary hyperparathyroidism of renal origin: Secondary | ICD-10-CM | POA: Diagnosis not present

## 2016-04-28 DIAGNOSIS — N186 End stage renal disease: Secondary | ICD-10-CM | POA: Diagnosis not present

## 2016-04-28 DIAGNOSIS — N2581 Secondary hyperparathyroidism of renal origin: Secondary | ICD-10-CM | POA: Diagnosis not present

## 2016-04-28 DIAGNOSIS — E1129 Type 2 diabetes mellitus with other diabetic kidney complication: Secondary | ICD-10-CM | POA: Diagnosis not present

## 2016-04-30 DIAGNOSIS — Z992 Dependence on renal dialysis: Secondary | ICD-10-CM | POA: Diagnosis not present

## 2016-04-30 DIAGNOSIS — N186 End stage renal disease: Secondary | ICD-10-CM | POA: Diagnosis not present

## 2016-04-30 DIAGNOSIS — N2581 Secondary hyperparathyroidism of renal origin: Secondary | ICD-10-CM | POA: Diagnosis not present

## 2016-04-30 DIAGNOSIS — E1129 Type 2 diabetes mellitus with other diabetic kidney complication: Secondary | ICD-10-CM | POA: Diagnosis not present

## 2016-05-02 DIAGNOSIS — N186 End stage renal disease: Secondary | ICD-10-CM | POA: Diagnosis not present

## 2016-05-02 DIAGNOSIS — D631 Anemia in chronic kidney disease: Secondary | ICD-10-CM | POA: Diagnosis not present

## 2016-05-02 DIAGNOSIS — E1129 Type 2 diabetes mellitus with other diabetic kidney complication: Secondary | ICD-10-CM | POA: Diagnosis not present

## 2016-05-02 DIAGNOSIS — N2581 Secondary hyperparathyroidism of renal origin: Secondary | ICD-10-CM | POA: Diagnosis not present

## 2016-05-05 DIAGNOSIS — N186 End stage renal disease: Secondary | ICD-10-CM | POA: Diagnosis not present

## 2016-05-05 DIAGNOSIS — N2581 Secondary hyperparathyroidism of renal origin: Secondary | ICD-10-CM | POA: Diagnosis not present

## 2016-05-05 DIAGNOSIS — E1129 Type 2 diabetes mellitus with other diabetic kidney complication: Secondary | ICD-10-CM | POA: Diagnosis not present

## 2016-05-05 DIAGNOSIS — D631 Anemia in chronic kidney disease: Secondary | ICD-10-CM | POA: Diagnosis not present

## 2016-05-07 DIAGNOSIS — D631 Anemia in chronic kidney disease: Secondary | ICD-10-CM | POA: Diagnosis not present

## 2016-05-07 DIAGNOSIS — N186 End stage renal disease: Secondary | ICD-10-CM | POA: Diagnosis not present

## 2016-05-07 DIAGNOSIS — E1129 Type 2 diabetes mellitus with other diabetic kidney complication: Secondary | ICD-10-CM | POA: Diagnosis not present

## 2016-05-07 DIAGNOSIS — N2581 Secondary hyperparathyroidism of renal origin: Secondary | ICD-10-CM | POA: Diagnosis not present

## 2016-05-09 DIAGNOSIS — N2581 Secondary hyperparathyroidism of renal origin: Secondary | ICD-10-CM | POA: Diagnosis not present

## 2016-05-09 DIAGNOSIS — N186 End stage renal disease: Secondary | ICD-10-CM | POA: Diagnosis not present

## 2016-05-09 DIAGNOSIS — D631 Anemia in chronic kidney disease: Secondary | ICD-10-CM | POA: Diagnosis not present

## 2016-05-09 DIAGNOSIS — E1129 Type 2 diabetes mellitus with other diabetic kidney complication: Secondary | ICD-10-CM | POA: Diagnosis not present

## 2016-05-12 DIAGNOSIS — N2581 Secondary hyperparathyroidism of renal origin: Secondary | ICD-10-CM | POA: Diagnosis not present

## 2016-05-12 DIAGNOSIS — E1129 Type 2 diabetes mellitus with other diabetic kidney complication: Secondary | ICD-10-CM | POA: Diagnosis not present

## 2016-05-12 DIAGNOSIS — N186 End stage renal disease: Secondary | ICD-10-CM | POA: Diagnosis not present

## 2016-05-12 DIAGNOSIS — D631 Anemia in chronic kidney disease: Secondary | ICD-10-CM | POA: Diagnosis not present

## 2016-05-14 DIAGNOSIS — N2581 Secondary hyperparathyroidism of renal origin: Secondary | ICD-10-CM | POA: Diagnosis not present

## 2016-05-14 DIAGNOSIS — N186 End stage renal disease: Secondary | ICD-10-CM | POA: Diagnosis not present

## 2016-05-14 DIAGNOSIS — E1129 Type 2 diabetes mellitus with other diabetic kidney complication: Secondary | ICD-10-CM | POA: Diagnosis not present

## 2016-05-14 DIAGNOSIS — D631 Anemia in chronic kidney disease: Secondary | ICD-10-CM | POA: Diagnosis not present

## 2016-05-16 ENCOUNTER — Other Ambulatory Visit: Payer: Self-pay | Admitting: Family Medicine

## 2016-05-16 DIAGNOSIS — E1129 Type 2 diabetes mellitus with other diabetic kidney complication: Secondary | ICD-10-CM | POA: Diagnosis not present

## 2016-05-16 DIAGNOSIS — N186 End stage renal disease: Secondary | ICD-10-CM | POA: Diagnosis not present

## 2016-05-16 DIAGNOSIS — N2581 Secondary hyperparathyroidism of renal origin: Secondary | ICD-10-CM | POA: Diagnosis not present

## 2016-05-16 DIAGNOSIS — D631 Anemia in chronic kidney disease: Secondary | ICD-10-CM | POA: Diagnosis not present

## 2016-05-19 DIAGNOSIS — N186 End stage renal disease: Secondary | ICD-10-CM | POA: Diagnosis not present

## 2016-05-19 DIAGNOSIS — N2581 Secondary hyperparathyroidism of renal origin: Secondary | ICD-10-CM | POA: Diagnosis not present

## 2016-05-19 DIAGNOSIS — D631 Anemia in chronic kidney disease: Secondary | ICD-10-CM | POA: Diagnosis not present

## 2016-05-19 DIAGNOSIS — E1129 Type 2 diabetes mellitus with other diabetic kidney complication: Secondary | ICD-10-CM | POA: Diagnosis not present

## 2016-05-20 DIAGNOSIS — H353113 Nonexudative age-related macular degeneration, right eye, advanced atrophic without subfoveal involvement: Secondary | ICD-10-CM | POA: Diagnosis not present

## 2016-05-20 DIAGNOSIS — H31009 Unspecified chorioretinal scars, unspecified eye: Secondary | ICD-10-CM | POA: Diagnosis not present

## 2016-05-20 DIAGNOSIS — E113553 Type 2 diabetes mellitus with stable proliferative diabetic retinopathy, bilateral: Secondary | ICD-10-CM | POA: Diagnosis not present

## 2016-05-20 DIAGNOSIS — H35043 Retinal micro-aneurysms, unspecified, bilateral: Secondary | ICD-10-CM | POA: Diagnosis not present

## 2016-05-21 DIAGNOSIS — N186 End stage renal disease: Secondary | ICD-10-CM | POA: Diagnosis not present

## 2016-05-21 DIAGNOSIS — D631 Anemia in chronic kidney disease: Secondary | ICD-10-CM | POA: Diagnosis not present

## 2016-05-21 DIAGNOSIS — N2581 Secondary hyperparathyroidism of renal origin: Secondary | ICD-10-CM | POA: Diagnosis not present

## 2016-05-21 DIAGNOSIS — E1129 Type 2 diabetes mellitus with other diabetic kidney complication: Secondary | ICD-10-CM | POA: Diagnosis not present

## 2016-05-23 DIAGNOSIS — D631 Anemia in chronic kidney disease: Secondary | ICD-10-CM | POA: Diagnosis not present

## 2016-05-23 DIAGNOSIS — E1129 Type 2 diabetes mellitus with other diabetic kidney complication: Secondary | ICD-10-CM | POA: Diagnosis not present

## 2016-05-23 DIAGNOSIS — N2581 Secondary hyperparathyroidism of renal origin: Secondary | ICD-10-CM | POA: Diagnosis not present

## 2016-05-23 DIAGNOSIS — N186 End stage renal disease: Secondary | ICD-10-CM | POA: Diagnosis not present

## 2016-05-26 DIAGNOSIS — E1129 Type 2 diabetes mellitus with other diabetic kidney complication: Secondary | ICD-10-CM | POA: Diagnosis not present

## 2016-05-26 DIAGNOSIS — N2581 Secondary hyperparathyroidism of renal origin: Secondary | ICD-10-CM | POA: Diagnosis not present

## 2016-05-26 DIAGNOSIS — D631 Anemia in chronic kidney disease: Secondary | ICD-10-CM | POA: Diagnosis not present

## 2016-05-26 DIAGNOSIS — N186 End stage renal disease: Secondary | ICD-10-CM | POA: Diagnosis not present

## 2016-05-28 DIAGNOSIS — N2581 Secondary hyperparathyroidism of renal origin: Secondary | ICD-10-CM | POA: Diagnosis not present

## 2016-05-28 DIAGNOSIS — E1129 Type 2 diabetes mellitus with other diabetic kidney complication: Secondary | ICD-10-CM | POA: Diagnosis not present

## 2016-05-28 DIAGNOSIS — N186 End stage renal disease: Secondary | ICD-10-CM | POA: Diagnosis not present

## 2016-05-28 DIAGNOSIS — D631 Anemia in chronic kidney disease: Secondary | ICD-10-CM | POA: Diagnosis not present

## 2016-05-30 DIAGNOSIS — D631 Anemia in chronic kidney disease: Secondary | ICD-10-CM | POA: Diagnosis not present

## 2016-05-30 DIAGNOSIS — N186 End stage renal disease: Secondary | ICD-10-CM | POA: Diagnosis not present

## 2016-05-30 DIAGNOSIS — E1129 Type 2 diabetes mellitus with other diabetic kidney complication: Secondary | ICD-10-CM | POA: Diagnosis not present

## 2016-05-30 DIAGNOSIS — N2581 Secondary hyperparathyroidism of renal origin: Secondary | ICD-10-CM | POA: Diagnosis not present

## 2016-05-31 DIAGNOSIS — E1129 Type 2 diabetes mellitus with other diabetic kidney complication: Secondary | ICD-10-CM | POA: Diagnosis not present

## 2016-05-31 DIAGNOSIS — N186 End stage renal disease: Secondary | ICD-10-CM | POA: Diagnosis not present

## 2016-05-31 DIAGNOSIS — Z992 Dependence on renal dialysis: Secondary | ICD-10-CM | POA: Diagnosis not present

## 2016-06-02 DIAGNOSIS — N186 End stage renal disease: Secondary | ICD-10-CM | POA: Diagnosis not present

## 2016-06-02 DIAGNOSIS — E1129 Type 2 diabetes mellitus with other diabetic kidney complication: Secondary | ICD-10-CM | POA: Diagnosis not present

## 2016-06-02 DIAGNOSIS — N2581 Secondary hyperparathyroidism of renal origin: Secondary | ICD-10-CM | POA: Diagnosis not present

## 2016-06-02 DIAGNOSIS — D509 Iron deficiency anemia, unspecified: Secondary | ICD-10-CM | POA: Diagnosis not present

## 2016-06-02 DIAGNOSIS — D631 Anemia in chronic kidney disease: Secondary | ICD-10-CM | POA: Diagnosis not present

## 2016-06-04 DIAGNOSIS — N2581 Secondary hyperparathyroidism of renal origin: Secondary | ICD-10-CM | POA: Diagnosis not present

## 2016-06-04 DIAGNOSIS — N186 End stage renal disease: Secondary | ICD-10-CM | POA: Diagnosis not present

## 2016-06-04 DIAGNOSIS — D509 Iron deficiency anemia, unspecified: Secondary | ICD-10-CM | POA: Diagnosis not present

## 2016-06-04 DIAGNOSIS — E1129 Type 2 diabetes mellitus with other diabetic kidney complication: Secondary | ICD-10-CM | POA: Diagnosis not present

## 2016-06-04 DIAGNOSIS — D631 Anemia in chronic kidney disease: Secondary | ICD-10-CM | POA: Diagnosis not present

## 2016-06-06 DIAGNOSIS — N186 End stage renal disease: Secondary | ICD-10-CM | POA: Diagnosis not present

## 2016-06-06 DIAGNOSIS — N2581 Secondary hyperparathyroidism of renal origin: Secondary | ICD-10-CM | POA: Diagnosis not present

## 2016-06-06 DIAGNOSIS — D509 Iron deficiency anemia, unspecified: Secondary | ICD-10-CM | POA: Diagnosis not present

## 2016-06-06 DIAGNOSIS — D631 Anemia in chronic kidney disease: Secondary | ICD-10-CM | POA: Diagnosis not present

## 2016-06-06 DIAGNOSIS — E1129 Type 2 diabetes mellitus with other diabetic kidney complication: Secondary | ICD-10-CM | POA: Diagnosis not present

## 2016-06-09 DIAGNOSIS — D631 Anemia in chronic kidney disease: Secondary | ICD-10-CM | POA: Diagnosis not present

## 2016-06-09 DIAGNOSIS — N2581 Secondary hyperparathyroidism of renal origin: Secondary | ICD-10-CM | POA: Diagnosis not present

## 2016-06-09 DIAGNOSIS — E1129 Type 2 diabetes mellitus with other diabetic kidney complication: Secondary | ICD-10-CM | POA: Diagnosis not present

## 2016-06-09 DIAGNOSIS — N186 End stage renal disease: Secondary | ICD-10-CM | POA: Diagnosis not present

## 2016-06-09 DIAGNOSIS — D509 Iron deficiency anemia, unspecified: Secondary | ICD-10-CM | POA: Diagnosis not present

## 2016-06-11 DIAGNOSIS — N2581 Secondary hyperparathyroidism of renal origin: Secondary | ICD-10-CM | POA: Diagnosis not present

## 2016-06-11 DIAGNOSIS — N186 End stage renal disease: Secondary | ICD-10-CM | POA: Diagnosis not present

## 2016-06-11 DIAGNOSIS — E1129 Type 2 diabetes mellitus with other diabetic kidney complication: Secondary | ICD-10-CM | POA: Diagnosis not present

## 2016-06-11 DIAGNOSIS — D631 Anemia in chronic kidney disease: Secondary | ICD-10-CM | POA: Diagnosis not present

## 2016-06-11 DIAGNOSIS — D509 Iron deficiency anemia, unspecified: Secondary | ICD-10-CM | POA: Diagnosis not present

## 2016-06-13 DIAGNOSIS — E1129 Type 2 diabetes mellitus with other diabetic kidney complication: Secondary | ICD-10-CM | POA: Diagnosis not present

## 2016-06-13 DIAGNOSIS — N2581 Secondary hyperparathyroidism of renal origin: Secondary | ICD-10-CM | POA: Diagnosis not present

## 2016-06-13 DIAGNOSIS — D631 Anemia in chronic kidney disease: Secondary | ICD-10-CM | POA: Diagnosis not present

## 2016-06-13 DIAGNOSIS — D509 Iron deficiency anemia, unspecified: Secondary | ICD-10-CM | POA: Diagnosis not present

## 2016-06-13 DIAGNOSIS — N186 End stage renal disease: Secondary | ICD-10-CM | POA: Diagnosis not present

## 2016-06-16 DIAGNOSIS — N186 End stage renal disease: Secondary | ICD-10-CM | POA: Diagnosis not present

## 2016-06-16 DIAGNOSIS — N2581 Secondary hyperparathyroidism of renal origin: Secondary | ICD-10-CM | POA: Diagnosis not present

## 2016-06-16 DIAGNOSIS — D509 Iron deficiency anemia, unspecified: Secondary | ICD-10-CM | POA: Diagnosis not present

## 2016-06-16 DIAGNOSIS — E1129 Type 2 diabetes mellitus with other diabetic kidney complication: Secondary | ICD-10-CM | POA: Diagnosis not present

## 2016-06-16 DIAGNOSIS — D631 Anemia in chronic kidney disease: Secondary | ICD-10-CM | POA: Diagnosis not present

## 2016-06-20 DIAGNOSIS — N186 End stage renal disease: Secondary | ICD-10-CM | POA: Diagnosis not present

## 2016-06-20 DIAGNOSIS — D631 Anemia in chronic kidney disease: Secondary | ICD-10-CM | POA: Diagnosis not present

## 2016-06-20 DIAGNOSIS — D509 Iron deficiency anemia, unspecified: Secondary | ICD-10-CM | POA: Diagnosis not present

## 2016-06-20 DIAGNOSIS — N2581 Secondary hyperparathyroidism of renal origin: Secondary | ICD-10-CM | POA: Diagnosis not present

## 2016-06-20 DIAGNOSIS — E1129 Type 2 diabetes mellitus with other diabetic kidney complication: Secondary | ICD-10-CM | POA: Diagnosis not present

## 2016-06-23 DIAGNOSIS — N2581 Secondary hyperparathyroidism of renal origin: Secondary | ICD-10-CM | POA: Diagnosis not present

## 2016-06-23 DIAGNOSIS — N186 End stage renal disease: Secondary | ICD-10-CM | POA: Diagnosis not present

## 2016-06-23 DIAGNOSIS — D509 Iron deficiency anemia, unspecified: Secondary | ICD-10-CM | POA: Diagnosis not present

## 2016-06-23 DIAGNOSIS — D631 Anemia in chronic kidney disease: Secondary | ICD-10-CM | POA: Diagnosis not present

## 2016-06-23 DIAGNOSIS — E1129 Type 2 diabetes mellitus with other diabetic kidney complication: Secondary | ICD-10-CM | POA: Diagnosis not present

## 2016-06-25 DIAGNOSIS — D509 Iron deficiency anemia, unspecified: Secondary | ICD-10-CM | POA: Diagnosis not present

## 2016-06-25 DIAGNOSIS — D631 Anemia in chronic kidney disease: Secondary | ICD-10-CM | POA: Diagnosis not present

## 2016-06-25 DIAGNOSIS — N186 End stage renal disease: Secondary | ICD-10-CM | POA: Diagnosis not present

## 2016-06-25 DIAGNOSIS — E1129 Type 2 diabetes mellitus with other diabetic kidney complication: Secondary | ICD-10-CM | POA: Diagnosis not present

## 2016-06-25 DIAGNOSIS — N2581 Secondary hyperparathyroidism of renal origin: Secondary | ICD-10-CM | POA: Diagnosis not present

## 2016-06-27 DIAGNOSIS — E1129 Type 2 diabetes mellitus with other diabetic kidney complication: Secondary | ICD-10-CM | POA: Diagnosis not present

## 2016-06-27 DIAGNOSIS — N2581 Secondary hyperparathyroidism of renal origin: Secondary | ICD-10-CM | POA: Diagnosis not present

## 2016-06-27 DIAGNOSIS — N186 End stage renal disease: Secondary | ICD-10-CM | POA: Diagnosis not present

## 2016-06-27 DIAGNOSIS — D631 Anemia in chronic kidney disease: Secondary | ICD-10-CM | POA: Diagnosis not present

## 2016-06-27 DIAGNOSIS — D509 Iron deficiency anemia, unspecified: Secondary | ICD-10-CM | POA: Diagnosis not present

## 2016-06-30 DIAGNOSIS — N2581 Secondary hyperparathyroidism of renal origin: Secondary | ICD-10-CM | POA: Diagnosis not present

## 2016-06-30 DIAGNOSIS — E1129 Type 2 diabetes mellitus with other diabetic kidney complication: Secondary | ICD-10-CM | POA: Diagnosis not present

## 2016-06-30 DIAGNOSIS — D509 Iron deficiency anemia, unspecified: Secondary | ICD-10-CM | POA: Diagnosis not present

## 2016-06-30 DIAGNOSIS — N186 End stage renal disease: Secondary | ICD-10-CM | POA: Diagnosis not present

## 2016-06-30 DIAGNOSIS — D631 Anemia in chronic kidney disease: Secondary | ICD-10-CM | POA: Diagnosis not present

## 2016-07-01 DIAGNOSIS — E1129 Type 2 diabetes mellitus with other diabetic kidney complication: Secondary | ICD-10-CM | POA: Diagnosis not present

## 2016-07-01 DIAGNOSIS — Z992 Dependence on renal dialysis: Secondary | ICD-10-CM | POA: Diagnosis not present

## 2016-07-01 DIAGNOSIS — N186 End stage renal disease: Secondary | ICD-10-CM | POA: Diagnosis not present

## 2016-07-02 DIAGNOSIS — Z283 Underimmunization status: Secondary | ICD-10-CM | POA: Diagnosis not present

## 2016-07-02 DIAGNOSIS — E1129 Type 2 diabetes mellitus with other diabetic kidney complication: Secondary | ICD-10-CM | POA: Diagnosis not present

## 2016-07-02 DIAGNOSIS — N186 End stage renal disease: Secondary | ICD-10-CM | POA: Diagnosis not present

## 2016-07-02 DIAGNOSIS — D631 Anemia in chronic kidney disease: Secondary | ICD-10-CM | POA: Diagnosis not present

## 2016-07-02 DIAGNOSIS — N2581 Secondary hyperparathyroidism of renal origin: Secondary | ICD-10-CM | POA: Diagnosis not present

## 2016-07-04 DIAGNOSIS — E1129 Type 2 diabetes mellitus with other diabetic kidney complication: Secondary | ICD-10-CM | POA: Diagnosis not present

## 2016-07-04 DIAGNOSIS — N2581 Secondary hyperparathyroidism of renal origin: Secondary | ICD-10-CM | POA: Diagnosis not present

## 2016-07-04 DIAGNOSIS — N186 End stage renal disease: Secondary | ICD-10-CM | POA: Diagnosis not present

## 2016-07-04 DIAGNOSIS — Z283 Underimmunization status: Secondary | ICD-10-CM | POA: Diagnosis not present

## 2016-07-04 DIAGNOSIS — D631 Anemia in chronic kidney disease: Secondary | ICD-10-CM | POA: Diagnosis not present

## 2016-07-07 DIAGNOSIS — N2581 Secondary hyperparathyroidism of renal origin: Secondary | ICD-10-CM | POA: Diagnosis not present

## 2016-07-07 DIAGNOSIS — D631 Anemia in chronic kidney disease: Secondary | ICD-10-CM | POA: Diagnosis not present

## 2016-07-07 DIAGNOSIS — E1129 Type 2 diabetes mellitus with other diabetic kidney complication: Secondary | ICD-10-CM | POA: Diagnosis not present

## 2016-07-07 DIAGNOSIS — Z283 Underimmunization status: Secondary | ICD-10-CM | POA: Diagnosis not present

## 2016-07-07 DIAGNOSIS — N186 End stage renal disease: Secondary | ICD-10-CM | POA: Diagnosis not present

## 2016-07-09 DIAGNOSIS — N186 End stage renal disease: Secondary | ICD-10-CM | POA: Diagnosis not present

## 2016-07-09 DIAGNOSIS — Z283 Underimmunization status: Secondary | ICD-10-CM | POA: Diagnosis not present

## 2016-07-09 DIAGNOSIS — E1129 Type 2 diabetes mellitus with other diabetic kidney complication: Secondary | ICD-10-CM | POA: Diagnosis not present

## 2016-07-09 DIAGNOSIS — I871 Compression of vein: Secondary | ICD-10-CM | POA: Diagnosis not present

## 2016-07-09 DIAGNOSIS — Z992 Dependence on renal dialysis: Secondary | ICD-10-CM | POA: Diagnosis not present

## 2016-07-09 DIAGNOSIS — N2581 Secondary hyperparathyroidism of renal origin: Secondary | ICD-10-CM | POA: Diagnosis not present

## 2016-07-09 DIAGNOSIS — D631 Anemia in chronic kidney disease: Secondary | ICD-10-CM | POA: Diagnosis not present

## 2016-07-09 DIAGNOSIS — T82868A Thrombosis of vascular prosthetic devices, implants and grafts, initial encounter: Secondary | ICD-10-CM | POA: Diagnosis not present

## 2016-07-11 DIAGNOSIS — N2581 Secondary hyperparathyroidism of renal origin: Secondary | ICD-10-CM | POA: Diagnosis not present

## 2016-07-11 DIAGNOSIS — E1129 Type 2 diabetes mellitus with other diabetic kidney complication: Secondary | ICD-10-CM | POA: Diagnosis not present

## 2016-07-11 DIAGNOSIS — N186 End stage renal disease: Secondary | ICD-10-CM | POA: Diagnosis not present

## 2016-07-11 DIAGNOSIS — D631 Anemia in chronic kidney disease: Secondary | ICD-10-CM | POA: Diagnosis not present

## 2016-07-11 DIAGNOSIS — Z283 Underimmunization status: Secondary | ICD-10-CM | POA: Diagnosis not present

## 2016-07-14 DIAGNOSIS — E1129 Type 2 diabetes mellitus with other diabetic kidney complication: Secondary | ICD-10-CM | POA: Diagnosis not present

## 2016-07-14 DIAGNOSIS — N186 End stage renal disease: Secondary | ICD-10-CM | POA: Diagnosis not present

## 2016-07-14 DIAGNOSIS — N2581 Secondary hyperparathyroidism of renal origin: Secondary | ICD-10-CM | POA: Diagnosis not present

## 2016-07-14 DIAGNOSIS — D631 Anemia in chronic kidney disease: Secondary | ICD-10-CM | POA: Diagnosis not present

## 2016-07-14 DIAGNOSIS — Z283 Underimmunization status: Secondary | ICD-10-CM | POA: Diagnosis not present

## 2016-07-16 DIAGNOSIS — N2581 Secondary hyperparathyroidism of renal origin: Secondary | ICD-10-CM | POA: Diagnosis not present

## 2016-07-16 DIAGNOSIS — E1129 Type 2 diabetes mellitus with other diabetic kidney complication: Secondary | ICD-10-CM | POA: Diagnosis not present

## 2016-07-16 DIAGNOSIS — D631 Anemia in chronic kidney disease: Secondary | ICD-10-CM | POA: Diagnosis not present

## 2016-07-16 DIAGNOSIS — N186 End stage renal disease: Secondary | ICD-10-CM | POA: Diagnosis not present

## 2016-07-16 DIAGNOSIS — Z283 Underimmunization status: Secondary | ICD-10-CM | POA: Diagnosis not present

## 2016-07-18 DIAGNOSIS — N2581 Secondary hyperparathyroidism of renal origin: Secondary | ICD-10-CM | POA: Diagnosis not present

## 2016-07-18 DIAGNOSIS — Z283 Underimmunization status: Secondary | ICD-10-CM | POA: Diagnosis not present

## 2016-07-18 DIAGNOSIS — D631 Anemia in chronic kidney disease: Secondary | ICD-10-CM | POA: Diagnosis not present

## 2016-07-18 DIAGNOSIS — E1129 Type 2 diabetes mellitus with other diabetic kidney complication: Secondary | ICD-10-CM | POA: Diagnosis not present

## 2016-07-18 DIAGNOSIS — N186 End stage renal disease: Secondary | ICD-10-CM | POA: Diagnosis not present

## 2016-07-21 DIAGNOSIS — E1129 Type 2 diabetes mellitus with other diabetic kidney complication: Secondary | ICD-10-CM | POA: Diagnosis not present

## 2016-07-21 DIAGNOSIS — N2581 Secondary hyperparathyroidism of renal origin: Secondary | ICD-10-CM | POA: Diagnosis not present

## 2016-07-21 DIAGNOSIS — N186 End stage renal disease: Secondary | ICD-10-CM | POA: Diagnosis not present

## 2016-07-21 DIAGNOSIS — D631 Anemia in chronic kidney disease: Secondary | ICD-10-CM | POA: Diagnosis not present

## 2016-07-21 DIAGNOSIS — Z283 Underimmunization status: Secondary | ICD-10-CM | POA: Diagnosis not present

## 2016-07-23 DIAGNOSIS — N2581 Secondary hyperparathyroidism of renal origin: Secondary | ICD-10-CM | POA: Diagnosis not present

## 2016-07-23 DIAGNOSIS — D631 Anemia in chronic kidney disease: Secondary | ICD-10-CM | POA: Diagnosis not present

## 2016-07-23 DIAGNOSIS — Z283 Underimmunization status: Secondary | ICD-10-CM | POA: Diagnosis not present

## 2016-07-23 DIAGNOSIS — E1129 Type 2 diabetes mellitus with other diabetic kidney complication: Secondary | ICD-10-CM | POA: Diagnosis not present

## 2016-07-23 DIAGNOSIS — N186 End stage renal disease: Secondary | ICD-10-CM | POA: Diagnosis not present

## 2016-07-25 DIAGNOSIS — D631 Anemia in chronic kidney disease: Secondary | ICD-10-CM | POA: Diagnosis not present

## 2016-07-25 DIAGNOSIS — Z283 Underimmunization status: Secondary | ICD-10-CM | POA: Diagnosis not present

## 2016-07-25 DIAGNOSIS — E1129 Type 2 diabetes mellitus with other diabetic kidney complication: Secondary | ICD-10-CM | POA: Diagnosis not present

## 2016-07-25 DIAGNOSIS — N186 End stage renal disease: Secondary | ICD-10-CM | POA: Diagnosis not present

## 2016-07-25 DIAGNOSIS — N2581 Secondary hyperparathyroidism of renal origin: Secondary | ICD-10-CM | POA: Diagnosis not present

## 2016-07-27 ENCOUNTER — Ambulatory Visit (INDEPENDENT_AMBULATORY_CARE_PROVIDER_SITE_OTHER): Payer: Medicare Other | Admitting: Family Medicine

## 2016-07-27 ENCOUNTER — Encounter: Payer: Self-pay | Admitting: Family Medicine

## 2016-07-27 ENCOUNTER — Encounter (INDEPENDENT_AMBULATORY_CARE_PROVIDER_SITE_OTHER): Payer: Self-pay

## 2016-07-27 VITALS — BP 110/66 | HR 72 | Temp 98.2°F | Ht 63.0 in | Wt 122.0 lb

## 2016-07-27 DIAGNOSIS — K219 Gastro-esophageal reflux disease without esophagitis: Secondary | ICD-10-CM

## 2016-07-27 DIAGNOSIS — E785 Hyperlipidemia, unspecified: Secondary | ICD-10-CM | POA: Diagnosis not present

## 2016-07-27 DIAGNOSIS — I10 Essential (primary) hypertension: Secondary | ICD-10-CM | POA: Diagnosis not present

## 2016-07-27 DIAGNOSIS — Z992 Dependence on renal dialysis: Secondary | ICD-10-CM | POA: Diagnosis not present

## 2016-07-27 DIAGNOSIS — N186 End stage renal disease: Secondary | ICD-10-CM

## 2016-07-27 DIAGNOSIS — Z8639 Personal history of other endocrine, nutritional and metabolic disease: Secondary | ICD-10-CM

## 2016-07-27 DIAGNOSIS — H9193 Unspecified hearing loss, bilateral: Secondary | ICD-10-CM | POA: Diagnosis not present

## 2016-07-27 DIAGNOSIS — F324 Major depressive disorder, single episode, in partial remission: Secondary | ICD-10-CM

## 2016-07-27 DIAGNOSIS — F0390 Unspecified dementia without behavioral disturbance: Secondary | ICD-10-CM

## 2016-07-27 DIAGNOSIS — Z0000 Encounter for general adult medical examination without abnormal findings: Secondary | ICD-10-CM

## 2016-07-27 DIAGNOSIS — Z7189 Other specified counseling: Secondary | ICD-10-CM | POA: Diagnosis not present

## 2016-07-27 MED ORDER — FAMOTIDINE 20 MG PO TABS: 20.0000 mg | ORAL_TABLET | Freq: Every day | ORAL | Status: AC

## 2016-07-27 NOTE — Assessment & Plan Note (Signed)
Stable on low dose lexapro.

## 2016-07-27 NOTE — Assessment & Plan Note (Signed)
Chronic, stable. Continue current regimen. 

## 2016-07-27 NOTE — Assessment & Plan Note (Addendum)
lipitor stopped by renal. Pt will bring me latest labs.

## 2016-07-27 NOTE — Assessment & Plan Note (Addendum)
Advanced directive discussion - discussed. Does not have set up. Would want daughter [name] and husband to be HCPOA. Handout provided today.

## 2016-07-27 NOTE — Assessment & Plan Note (Signed)
Appreciate renal care of patient. Daughter will bring me records of latest labs from dialysis.

## 2016-07-27 NOTE — Assessment & Plan Note (Signed)

## 2016-07-27 NOTE — Progress Notes (Signed)
Pre visit review using our clinic review tool, if applicable. No additional management support is needed unless otherwise documented below in the visit note. 

## 2016-07-27 NOTE — Progress Notes (Addendum)
BP 110/66 (BP Location: Left Arm, Patient Position: Sitting, Cuff Size: Normal)   Pulse 72   Temp 98.2 F (36.8 C) (Oral)   Ht 5\' 3"  (1.6 m)   Wt 122 lb (55.3 kg)   BMI 21.61 kg/m    CC: medicare wellness visit Subjective:    Patient ID: [name], female    DOB: February 06, 1929, 83 y.o.   MRN: [id number]  HPI: Michelle CorinJosie W Mehler is a 83 y.o. female presenting on 07/27/2016 for Medicare Wellness   Dementia with sundowning - family used online resources to learn about dementia. Worse sundowning on dialysis nights. Suggested namenda 10mg  at night, 5mg  BID otherwise. She has done much better with this dosing. Caregiver now at home to help with patient and her husband - caregiver at home M-F 8:30-5pm.   Appetite good - likes breakfast foods.  Mild intermittent constipation treated with dulcolax or prune juice.  [facility] Lab through dialysis. Anuric for years.   Hearing screen - has hearing aide but doesn't regularly use Vision screen - saw eye doctor 06/2016 Fall risk screen - passed Depression screen - passed  Preventative: Colon cancer screening - aged out Lung cancer screening - not indicated Breast cancer screening - aged out Well woman exam - aged out DEXA scan - unsure Flu shot yearly Tetanus shot ~2014 Pneumovax 2016, prevnar 2014 Shingles shot - not at this time. Advanced directive discussion - discussed. Does not have set up. Would want daughter Dorice LamasGwen Townsend and husband to be HCPOA. Handout provided today. Seat belt use discussed. No changing moles on skin. Non smoker Alcohol - none  Lives with husband and daughter Occupation: retired, was Marine scientistinsurance underwriter Activity: in wheelchair, no regular exercise Diet: drinks water and gatorade, good fruits/vegetables, avoids salt in diet  Relevant past medical, surgical, family and social history reviewed and updated as indicated. Interim medical history since our last visit  reviewed. Allergies and medications reviewed and updated. Outpatient Medications Prior to Visit  Medication Sig Dispense Refill  . acetaminophen (TYLENOL) 500 MG tablet Take 500 mg by mouth 2 (two) times daily as needed for mild pain.     [name]. amLODipine (NORVASC) 5 MG tablet TAKE ONE-HALF TABLET BY MOUTH ON SUNDAY, MONDAY, WEDNESDAY, AND FRIDAY. TAKE ONE TABLET BY MOUTH ALL OTHER DAYS OF THE WEEK 30 tablet 3  . aspirin 81 MG tablet Take 81 mg by mouth daily.    . carvedilol (COREG) 3.125 MG tablet TAKE ONE TABLET BY MOUTH TWICE DAILY WITH MEALS 180 tablet 1  . cinacalcet (SENSIPAR) 30 MG tablet Take 30 mg by mouth daily.    . Dorzolamide HCl-Timolol Mal (COSOPT OP) Place 1 drop into both eyes daily.     [name]. escitalopram (LEXAPRO) 10 MG tablet TAKE ONE-HALF TABLET BY MOUTH ONCE DAILY 45 tablet 3  . folic acid-vitamin b complex-vitamin c-selenium-zinc (DIALYVITE) 3 MG TABS Take 1 tablet by mouth daily.      . memantine (NAMENDA) 5 MG tablet Take 1 tablet (5 mg total) by mouth 2 (two) times daily. On evenings of dialysis, take 2 namenda 75 tablet 6  . multivitamin (RENA-VIT) TABS tablet TAKE ONE TABLET BY MOUTH AT BEDTIME 90 tablet 3  . sevelamer carbonate (RENVELA) 800 MG tablet Take 2 tablets (1,600 mg total) by mouth 3 (three) times daily with meals. 180 tablet 1  . famotidine (PEPCID) 20 MG tablet Take 1 tablet (20 mg total) by mouth 2 (two) times daily. (Patient  taking differently: Take 20 mg by mouth daily. ) 60 tablet 0   No facility-administered medications prior to visit.      Per HPI unless specifically indicated in ROS section below Review of Systems     Objective:    BP 110/66 (BP Location: Left Arm, Patient Position: Sitting, Cuff Size: Normal)   Pulse 72   Temp 98.2 F (36.8 C) (Oral)   Ht 5\' 3"  (1.6 m)   Wt 122 lb (55.3 kg)   BMI 21.61 kg/m   Wt Readings from Last 3 Encounters:  07/27/16 122 lb (55.3 kg)  01/22/16 118 lb 4 oz (53.6 kg)  12/30/15 123 lb 8 oz (56 kg)     Physical Exam  Constitutional: She is oriented to person, place, and time. She appears well-developed and well-nourished. No distress.  HENT:  Head: Normocephalic and atraumatic.  Right Ear: Hearing, tympanic membrane, external ear and ear canal normal.  Left Ear: Hearing, tympanic membrane, external ear and ear canal normal.  Nose: Nose normal.  Mouth/Throat: Uvula is midline, oropharynx is clear and moist and mucous membranes are normal. No oropharyngeal exudate, posterior oropharyngeal edema or posterior oropharyngeal erythema.  Eyes: Conjunctivae and EOM are normal. Pupils are equal, round, and reactive to light. No scleral icterus.  Neck: Normal range of motion. Neck supple. No thyromegaly present.  Cardiovascular: Normal rate, regular rhythm and intact distal pulses.   Murmur (3/6 systolic) heard. Pulses:      Radial pulses are 2+ on the right side, and 2+ on the left side.  Pulmonary/Chest: Effort normal and breath sounds normal. No respiratory distress. She has no wheezes. She has no rales.  Abdominal: Soft. Bowel sounds are normal. She exhibits no distension and no mass. There is no tenderness. There is no rebound and no guarding.  Musculoskeletal: Normal range of motion. She exhibits no edema.  Lymphadenopathy:    She has no cervical adenopathy.  Neurological: She is alert and oriented to person, place, and time.  In wheelchair  Skin: Skin is warm and dry. No rash noted.  Psychiatric: She has a normal mood and affect. Her behavior is normal. Judgment and thought content normal.  Nursing note and vitals reviewed.  Lab Results  Component Value Date   HGBA1C 5.2 07/19/2015    Lab Results  Component Value Date   CHOL 200 07/19/2015   HDL 69.40 07/19/2015   LDLCALC 115 (H) 07/19/2015   LDLDIRECT 97.3 01/11/2009   TRIG 79.0 07/19/2015   CHOLHDL 3 07/19/2015       Assessment & Plan:   Problem List Items Addressed This Visit    Advanced care planning/counseling  discussion    Advanced directive discussion - discussed. Does not have set up. Would want daughter Dorice Lamas and husband to be HCPOA. Handout provided today.      Dementia    Chronic, stable on namenda.       Depression    Stable on low dose lexapro.      ESRD on hemodialysis [facility]    Appreciate renal care of patient. Daughter will bring me records of latest labs from dialysis.      Essential hypertension    Chronic, stable. Continue current regimen.       GERD    Stable on once daily pepcid.       Relevant Medications   famotidine (PEPCID) 20 MG tablet   Hearing loss    L>R hearing loss. Doesn't regularly use hearing aides.  History of diabetes mellitus, type II    Stable off antihyperglycemics.       HLD (hyperlipidemia)    lipitor stopped by renal. Pt will bring me latest labs.       Medicare annual wellness visit, subsequent - Primary    I have personally reviewed the Medicare Annual Wellness questionnaire and have noted 1. The patient's medical and social history 2. Their use of alcohol, tobacco or illicit drugs 3. Their current medications and supplements 4. The patient's functional ability including ADL's, fall risks, home safety risks and hearing or visual impairment. Cognitive function has been assessed and addressed as indicated.  5. Diet and physical activity 6. Evidence for depression or mood disorders The patients weight, height, BMI have been recorded in the chart. I have made referrals, counseling and provided education to the patient based on review of the above and I have provided the pt with a written personalized care plan for preventive services. Provider list updated.. See scanned questionairre as needed for further documentation. Reviewed preventative protocols and updated unless pt declined.           Follow up plan: Return in about 1 year (around 07/27/2017) for medicare wellness visit.  Eustaquio Boyden, MD

## 2016-07-27 NOTE — Assessment & Plan Note (Signed)
L>R hearing loss. Doesn't regularly use hearing aides.

## 2016-07-27 NOTE — Patient Instructions (Addendum)
Health care power of attorney form provided today.  Bring me copy of your latest labs to update your chart. We will prepare letter for you and call you when ready.  Good to see you today, call us with questions. Return as needed or in 1 year for next medicare wellness visit.

## 2016-07-27 NOTE — Assessment & Plan Note (Signed)
Chronic, stable on namenda.

## 2016-07-27 NOTE — Assessment & Plan Note (Addendum)
Stable on once daily pepcid.

## 2016-07-27 NOTE — Assessment & Plan Note (Signed)
Stable off antihyperglycemics.

## 2016-07-28 DIAGNOSIS — Z283 Underimmunization status: Secondary | ICD-10-CM | POA: Diagnosis not present

## 2016-07-28 DIAGNOSIS — N186 End stage renal disease: Secondary | ICD-10-CM | POA: Diagnosis not present

## 2016-07-28 DIAGNOSIS — E1129 Type 2 diabetes mellitus with other diabetic kidney complication: Secondary | ICD-10-CM | POA: Diagnosis not present

## 2016-07-28 DIAGNOSIS — N2581 Secondary hyperparathyroidism of renal origin: Secondary | ICD-10-CM | POA: Diagnosis not present

## 2016-07-28 DIAGNOSIS — D631 Anemia in chronic kidney disease: Secondary | ICD-10-CM | POA: Diagnosis not present

## 2016-07-29 DIAGNOSIS — Z992 Dependence on renal dialysis: Secondary | ICD-10-CM | POA: Diagnosis not present

## 2016-07-29 DIAGNOSIS — N186 End stage renal disease: Secondary | ICD-10-CM | POA: Diagnosis not present

## 2016-07-29 DIAGNOSIS — E1129 Type 2 diabetes mellitus with other diabetic kidney complication: Secondary | ICD-10-CM | POA: Diagnosis not present

## 2016-07-30 DIAGNOSIS — D631 Anemia in chronic kidney disease: Secondary | ICD-10-CM | POA: Diagnosis not present

## 2016-07-30 DIAGNOSIS — N2581 Secondary hyperparathyroidism of renal origin: Secondary | ICD-10-CM | POA: Diagnosis not present

## 2016-07-30 DIAGNOSIS — E1129 Type 2 diabetes mellitus with other diabetic kidney complication: Secondary | ICD-10-CM | POA: Diagnosis not present

## 2016-07-30 DIAGNOSIS — N186 End stage renal disease: Secondary | ICD-10-CM | POA: Diagnosis not present

## 2016-08-01 DIAGNOSIS — N186 End stage renal disease: Secondary | ICD-10-CM | POA: Diagnosis not present

## 2016-08-01 DIAGNOSIS — N2581 Secondary hyperparathyroidism of renal origin: Secondary | ICD-10-CM | POA: Diagnosis not present

## 2016-08-01 DIAGNOSIS — E1129 Type 2 diabetes mellitus with other diabetic kidney complication: Secondary | ICD-10-CM | POA: Diagnosis not present

## 2016-08-01 DIAGNOSIS — D631 Anemia in chronic kidney disease: Secondary | ICD-10-CM | POA: Diagnosis not present

## 2016-08-04 DIAGNOSIS — D631 Anemia in chronic kidney disease: Secondary | ICD-10-CM | POA: Diagnosis not present

## 2016-08-04 DIAGNOSIS — N186 End stage renal disease: Secondary | ICD-10-CM | POA: Diagnosis not present

## 2016-08-04 DIAGNOSIS — N2581 Secondary hyperparathyroidism of renal origin: Secondary | ICD-10-CM | POA: Diagnosis not present

## 2016-08-04 DIAGNOSIS — E1129 Type 2 diabetes mellitus with other diabetic kidney complication: Secondary | ICD-10-CM | POA: Diagnosis not present

## 2016-08-06 DIAGNOSIS — D631 Anemia in chronic kidney disease: Secondary | ICD-10-CM | POA: Diagnosis not present

## 2016-08-06 DIAGNOSIS — N2581 Secondary hyperparathyroidism of renal origin: Secondary | ICD-10-CM | POA: Diagnosis not present

## 2016-08-06 DIAGNOSIS — E1129 Type 2 diabetes mellitus with other diabetic kidney complication: Secondary | ICD-10-CM | POA: Diagnosis not present

## 2016-08-06 DIAGNOSIS — N186 End stage renal disease: Secondary | ICD-10-CM | POA: Diagnosis not present

## 2016-08-08 DIAGNOSIS — E1129 Type 2 diabetes mellitus with other diabetic kidney complication: Secondary | ICD-10-CM | POA: Diagnosis not present

## 2016-08-08 DIAGNOSIS — D631 Anemia in chronic kidney disease: Secondary | ICD-10-CM | POA: Diagnosis not present

## 2016-08-08 DIAGNOSIS — N2581 Secondary hyperparathyroidism of renal origin: Secondary | ICD-10-CM | POA: Diagnosis not present

## 2016-08-08 DIAGNOSIS — N186 End stage renal disease: Secondary | ICD-10-CM | POA: Diagnosis not present

## 2016-08-11 DIAGNOSIS — D631 Anemia in chronic kidney disease: Secondary | ICD-10-CM | POA: Diagnosis not present

## 2016-08-11 DIAGNOSIS — N2581 Secondary hyperparathyroidism of renal origin: Secondary | ICD-10-CM | POA: Diagnosis not present

## 2016-08-11 DIAGNOSIS — E1129 Type 2 diabetes mellitus with other diabetic kidney complication: Secondary | ICD-10-CM | POA: Diagnosis not present

## 2016-08-11 DIAGNOSIS — N186 End stage renal disease: Secondary | ICD-10-CM | POA: Diagnosis not present

## 2016-08-13 DIAGNOSIS — N186 End stage renal disease: Secondary | ICD-10-CM | POA: Diagnosis not present

## 2016-08-13 DIAGNOSIS — E1129 Type 2 diabetes mellitus with other diabetic kidney complication: Secondary | ICD-10-CM | POA: Diagnosis not present

## 2016-08-13 DIAGNOSIS — N2581 Secondary hyperparathyroidism of renal origin: Secondary | ICD-10-CM | POA: Diagnosis not present

## 2016-08-13 DIAGNOSIS — D631 Anemia in chronic kidney disease: Secondary | ICD-10-CM | POA: Diagnosis not present

## 2016-08-15 DIAGNOSIS — N2581 Secondary hyperparathyroidism of renal origin: Secondary | ICD-10-CM | POA: Diagnosis not present

## 2016-08-15 DIAGNOSIS — E1129 Type 2 diabetes mellitus with other diabetic kidney complication: Secondary | ICD-10-CM | POA: Diagnosis not present

## 2016-08-15 DIAGNOSIS — D631 Anemia in chronic kidney disease: Secondary | ICD-10-CM | POA: Diagnosis not present

## 2016-08-15 DIAGNOSIS — N186 End stage renal disease: Secondary | ICD-10-CM | POA: Diagnosis not present

## 2016-08-18 DIAGNOSIS — D631 Anemia in chronic kidney disease: Secondary | ICD-10-CM | POA: Diagnosis not present

## 2016-08-18 DIAGNOSIS — E1129 Type 2 diabetes mellitus with other diabetic kidney complication: Secondary | ICD-10-CM | POA: Diagnosis not present

## 2016-08-18 DIAGNOSIS — N186 End stage renal disease: Secondary | ICD-10-CM | POA: Diagnosis not present

## 2016-08-18 DIAGNOSIS — N2581 Secondary hyperparathyroidism of renal origin: Secondary | ICD-10-CM | POA: Diagnosis not present

## 2016-08-20 DIAGNOSIS — N186 End stage renal disease: Secondary | ICD-10-CM | POA: Diagnosis not present

## 2016-08-20 DIAGNOSIS — E1129 Type 2 diabetes mellitus with other diabetic kidney complication: Secondary | ICD-10-CM | POA: Diagnosis not present

## 2016-08-20 DIAGNOSIS — D631 Anemia in chronic kidney disease: Secondary | ICD-10-CM | POA: Diagnosis not present

## 2016-08-20 DIAGNOSIS — N2581 Secondary hyperparathyroidism of renal origin: Secondary | ICD-10-CM | POA: Diagnosis not present

## 2016-08-22 DIAGNOSIS — N186 End stage renal disease: Secondary | ICD-10-CM | POA: Diagnosis not present

## 2016-08-22 DIAGNOSIS — E1129 Type 2 diabetes mellitus with other diabetic kidney complication: Secondary | ICD-10-CM | POA: Diagnosis not present

## 2016-08-22 DIAGNOSIS — D631 Anemia in chronic kidney disease: Secondary | ICD-10-CM | POA: Diagnosis not present

## 2016-08-22 DIAGNOSIS — N2581 Secondary hyperparathyroidism of renal origin: Secondary | ICD-10-CM | POA: Diagnosis not present

## 2016-08-25 DIAGNOSIS — N2581 Secondary hyperparathyroidism of renal origin: Secondary | ICD-10-CM | POA: Diagnosis not present

## 2016-08-25 DIAGNOSIS — E1129 Type 2 diabetes mellitus with other diabetic kidney complication: Secondary | ICD-10-CM | POA: Diagnosis not present

## 2016-08-25 DIAGNOSIS — D631 Anemia in chronic kidney disease: Secondary | ICD-10-CM | POA: Diagnosis not present

## 2016-08-25 DIAGNOSIS — N186 End stage renal disease: Secondary | ICD-10-CM | POA: Diagnosis not present

## 2016-08-27 DIAGNOSIS — E1129 Type 2 diabetes mellitus with other diabetic kidney complication: Secondary | ICD-10-CM | POA: Diagnosis not present

## 2016-08-27 DIAGNOSIS — N186 End stage renal disease: Secondary | ICD-10-CM | POA: Diagnosis not present

## 2016-08-27 DIAGNOSIS — N2581 Secondary hyperparathyroidism of renal origin: Secondary | ICD-10-CM | POA: Diagnosis not present

## 2016-08-27 DIAGNOSIS — D631 Anemia in chronic kidney disease: Secondary | ICD-10-CM | POA: Diagnosis not present

## 2016-08-29 DIAGNOSIS — D631 Anemia in chronic kidney disease: Secondary | ICD-10-CM | POA: Diagnosis not present

## 2016-08-29 DIAGNOSIS — E1129 Type 2 diabetes mellitus with other diabetic kidney complication: Secondary | ICD-10-CM | POA: Diagnosis not present

## 2016-08-29 DIAGNOSIS — N186 End stage renal disease: Secondary | ICD-10-CM | POA: Diagnosis not present

## 2016-08-29 DIAGNOSIS — N2581 Secondary hyperparathyroidism of renal origin: Secondary | ICD-10-CM | POA: Diagnosis not present

## 2016-08-29 DIAGNOSIS — Z992 Dependence on renal dialysis: Secondary | ICD-10-CM | POA: Diagnosis not present

## 2016-09-01 DIAGNOSIS — N2581 Secondary hyperparathyroidism of renal origin: Secondary | ICD-10-CM | POA: Diagnosis not present

## 2016-09-01 DIAGNOSIS — D631 Anemia in chronic kidney disease: Secondary | ICD-10-CM | POA: Diagnosis not present

## 2016-09-01 DIAGNOSIS — N186 End stage renal disease: Secondary | ICD-10-CM | POA: Diagnosis not present

## 2016-09-01 DIAGNOSIS — E1129 Type 2 diabetes mellitus with other diabetic kidney complication: Secondary | ICD-10-CM | POA: Diagnosis not present

## 2016-09-03 ENCOUNTER — Other Ambulatory Visit: Payer: Self-pay | Admitting: Family Medicine

## 2016-09-03 DIAGNOSIS — N2581 Secondary hyperparathyroidism of renal origin: Secondary | ICD-10-CM | POA: Diagnosis not present

## 2016-09-03 DIAGNOSIS — N186 End stage renal disease: Secondary | ICD-10-CM | POA: Diagnosis not present

## 2016-09-03 DIAGNOSIS — E1129 Type 2 diabetes mellitus with other diabetic kidney complication: Secondary | ICD-10-CM | POA: Diagnosis not present

## 2016-09-03 DIAGNOSIS — D631 Anemia in chronic kidney disease: Secondary | ICD-10-CM | POA: Diagnosis not present

## 2016-09-05 DIAGNOSIS — D631 Anemia in chronic kidney disease: Secondary | ICD-10-CM | POA: Diagnosis not present

## 2016-09-05 DIAGNOSIS — N186 End stage renal disease: Secondary | ICD-10-CM | POA: Diagnosis not present

## 2016-09-05 DIAGNOSIS — N2581 Secondary hyperparathyroidism of renal origin: Secondary | ICD-10-CM | POA: Diagnosis not present

## 2016-09-05 DIAGNOSIS — E1129 Type 2 diabetes mellitus with other diabetic kidney complication: Secondary | ICD-10-CM | POA: Diagnosis not present

## 2016-09-07 ENCOUNTER — Telehealth: Payer: Self-pay | Admitting: Family Medicine

## 2016-09-07 NOTE — Telephone Encounter (Signed)
Pt daughter dropped off A1c numbers for your review. I placed on cart for dropoff.

## 2016-09-08 DIAGNOSIS — N2581 Secondary hyperparathyroidism of renal origin: Secondary | ICD-10-CM | POA: Diagnosis not present

## 2016-09-08 DIAGNOSIS — E1129 Type 2 diabetes mellitus with other diabetic kidney complication: Secondary | ICD-10-CM | POA: Diagnosis not present

## 2016-09-08 DIAGNOSIS — N186 End stage renal disease: Secondary | ICD-10-CM | POA: Diagnosis not present

## 2016-09-08 DIAGNOSIS — D631 Anemia in chronic kidney disease: Secondary | ICD-10-CM | POA: Diagnosis not present

## 2016-09-09 NOTE — Telephone Encounter (Signed)
Noted  

## 2016-09-10 DIAGNOSIS — E1129 Type 2 diabetes mellitus with other diabetic kidney complication: Secondary | ICD-10-CM | POA: Diagnosis not present

## 2016-09-10 DIAGNOSIS — N186 End stage renal disease: Secondary | ICD-10-CM | POA: Diagnosis not present

## 2016-09-10 DIAGNOSIS — D631 Anemia in chronic kidney disease: Secondary | ICD-10-CM | POA: Diagnosis not present

## 2016-09-10 DIAGNOSIS — N2581 Secondary hyperparathyroidism of renal origin: Secondary | ICD-10-CM | POA: Diagnosis not present

## 2016-09-12 DIAGNOSIS — D631 Anemia in chronic kidney disease: Secondary | ICD-10-CM | POA: Diagnosis not present

## 2016-09-12 DIAGNOSIS — N186 End stage renal disease: Secondary | ICD-10-CM | POA: Diagnosis not present

## 2016-09-12 DIAGNOSIS — N2581 Secondary hyperparathyroidism of renal origin: Secondary | ICD-10-CM | POA: Diagnosis not present

## 2016-09-12 DIAGNOSIS — E1129 Type 2 diabetes mellitus with other diabetic kidney complication: Secondary | ICD-10-CM | POA: Diagnosis not present

## 2016-09-15 DIAGNOSIS — N186 End stage renal disease: Secondary | ICD-10-CM | POA: Diagnosis not present

## 2016-09-15 DIAGNOSIS — N2581 Secondary hyperparathyroidism of renal origin: Secondary | ICD-10-CM | POA: Diagnosis not present

## 2016-09-15 DIAGNOSIS — D631 Anemia in chronic kidney disease: Secondary | ICD-10-CM | POA: Diagnosis not present

## 2016-09-15 DIAGNOSIS — E1129 Type 2 diabetes mellitus with other diabetic kidney complication: Secondary | ICD-10-CM | POA: Diagnosis not present

## 2016-09-17 DIAGNOSIS — E1129 Type 2 diabetes mellitus with other diabetic kidney complication: Secondary | ICD-10-CM | POA: Diagnosis not present

## 2016-09-17 DIAGNOSIS — N186 End stage renal disease: Secondary | ICD-10-CM | POA: Diagnosis not present

## 2016-09-17 DIAGNOSIS — N2581 Secondary hyperparathyroidism of renal origin: Secondary | ICD-10-CM | POA: Diagnosis not present

## 2016-09-17 DIAGNOSIS — D631 Anemia in chronic kidney disease: Secondary | ICD-10-CM | POA: Diagnosis not present

## 2016-09-19 DIAGNOSIS — E1129 Type 2 diabetes mellitus with other diabetic kidney complication: Secondary | ICD-10-CM | POA: Diagnosis not present

## 2016-09-19 DIAGNOSIS — N2581 Secondary hyperparathyroidism of renal origin: Secondary | ICD-10-CM | POA: Diagnosis not present

## 2016-09-19 DIAGNOSIS — N186 End stage renal disease: Secondary | ICD-10-CM | POA: Diagnosis not present

## 2016-09-19 DIAGNOSIS — D631 Anemia in chronic kidney disease: Secondary | ICD-10-CM | POA: Diagnosis not present

## 2016-09-22 DIAGNOSIS — E1129 Type 2 diabetes mellitus with other diabetic kidney complication: Secondary | ICD-10-CM | POA: Diagnosis not present

## 2016-09-22 DIAGNOSIS — E1151 Type 2 diabetes mellitus with diabetic peripheral angiopathy without gangrene: Secondary | ICD-10-CM | POA: Diagnosis not present

## 2016-09-22 DIAGNOSIS — N186 End stage renal disease: Secondary | ICD-10-CM | POA: Diagnosis not present

## 2016-09-22 DIAGNOSIS — M2011 Hallux valgus (acquired), right foot: Secondary | ICD-10-CM | POA: Diagnosis not present

## 2016-09-22 DIAGNOSIS — L603 Nail dystrophy: Secondary | ICD-10-CM | POA: Diagnosis not present

## 2016-09-22 DIAGNOSIS — M2012 Hallux valgus (acquired), left foot: Secondary | ICD-10-CM | POA: Diagnosis not present

## 2016-09-22 DIAGNOSIS — I739 Peripheral vascular disease, unspecified: Secondary | ICD-10-CM | POA: Diagnosis not present

## 2016-09-22 DIAGNOSIS — N2581 Secondary hyperparathyroidism of renal origin: Secondary | ICD-10-CM | POA: Diagnosis not present

## 2016-09-22 DIAGNOSIS — D631 Anemia in chronic kidney disease: Secondary | ICD-10-CM | POA: Diagnosis not present

## 2016-09-24 DIAGNOSIS — D631 Anemia in chronic kidney disease: Secondary | ICD-10-CM | POA: Diagnosis not present

## 2016-09-24 DIAGNOSIS — N2581 Secondary hyperparathyroidism of renal origin: Secondary | ICD-10-CM | POA: Diagnosis not present

## 2016-09-24 DIAGNOSIS — E1129 Type 2 diabetes mellitus with other diabetic kidney complication: Secondary | ICD-10-CM | POA: Diagnosis not present

## 2016-09-24 DIAGNOSIS — N186 End stage renal disease: Secondary | ICD-10-CM | POA: Diagnosis not present

## 2016-09-26 ENCOUNTER — Telehealth: Payer: Self-pay | Admitting: Family Medicine

## 2016-09-26 ENCOUNTER — Emergency Department (HOSPITAL_COMMUNITY)
Admission: EM | Admit: 2016-09-26 | Discharge: 2016-09-26 | Disposition: A | Discharge status: Home / Self Care | Payer: Medicare Other | Attending: Emergency Medicine | Admitting: Emergency Medicine

## 2016-09-26 ENCOUNTER — Encounter (HOSPITAL_COMMUNITY): Payer: Self-pay | Admitting: Emergency Medicine

## 2016-09-26 ENCOUNTER — Other Ambulatory Visit: Payer: Self-pay | Admitting: Family Medicine

## 2016-09-26 DIAGNOSIS — N186 End stage renal disease: Secondary | ICD-10-CM | POA: Insufficient documentation

## 2016-09-26 DIAGNOSIS — I120 Hypertensive chronic kidney disease with stage 5 chronic kidney disease or end stage renal disease: Secondary | ICD-10-CM | POA: Insufficient documentation

## 2016-09-26 DIAGNOSIS — Z79899 Other long term (current) drug therapy: Secondary | ICD-10-CM | POA: Insufficient documentation

## 2016-09-26 DIAGNOSIS — E1122 Type 2 diabetes mellitus with diabetic chronic kidney disease: Secondary | ICD-10-CM | POA: Diagnosis not present

## 2016-09-26 DIAGNOSIS — N2581 Secondary hyperparathyroidism of renal origin: Secondary | ICD-10-CM | POA: Diagnosis not present

## 2016-09-26 DIAGNOSIS — R197 Diarrhea, unspecified: Secondary | ICD-10-CM | POA: Diagnosis not present

## 2016-09-26 DIAGNOSIS — Z7982 Long term (current) use of aspirin: Secondary | ICD-10-CM | POA: Diagnosis not present

## 2016-09-26 DIAGNOSIS — E1129 Type 2 diabetes mellitus with other diabetic kidney complication: Secondary | ICD-10-CM | POA: Diagnosis not present

## 2016-09-26 DIAGNOSIS — R112 Nausea with vomiting, unspecified: Secondary | ICD-10-CM

## 2016-09-26 DIAGNOSIS — N3000 Acute cystitis without hematuria: Secondary | ICD-10-CM | POA: Insufficient documentation

## 2016-09-26 DIAGNOSIS — D631 Anemia in chronic kidney disease: Secondary | ICD-10-CM | POA: Diagnosis not present

## 2016-09-26 LAB — CBC WITH DIFFERENTIAL/PLATELET
BASOS ABS: 0 10*3/uL (ref 0.0–0.1)
BASOS PCT: 1 %
EOS ABS: 0.2 10*3/uL (ref 0.0–0.7)
EOS PCT: 4 %
HCT: 37.6 % (ref 36.0–46.0)
Hemoglobin: 12.4 g/dL (ref 12.0–15.0)
Lymphocytes Relative: 36 %
Lymphs Abs: 1.4 10*3/uL (ref 0.7–4.0)
MCH: 30 pg (ref 26.0–34.0)
MCHC: 33 g/dL (ref 30.0–36.0)
MCV: 90.8 fL (ref 78.0–100.0)
MONO ABS: 0.3 10*3/uL (ref 0.1–1.0)
MONOS PCT: 7 %
NEUTROS ABS: 2 10*3/uL (ref 1.7–7.7)
Neutrophils Relative %: 53 %
PLATELETS: 92 10*3/uL — AB (ref 150–400)
RBC: 4.14 MIL/uL (ref 3.87–5.11)
RDW: 17.9 % — AB (ref 11.5–15.5)
WBC: 3.8 10*3/uL — ABNORMAL LOW (ref 4.0–10.5)

## 2016-09-26 LAB — URINALYSIS, MICROSCOPIC (REFLEX)

## 2016-09-26 LAB — URINALYSIS, ROUTINE W REFLEX MICROSCOPIC
Glucose, UA: 100 mg/dL — AB
KETONES UR: 15 mg/dL — AB
NITRITE: POSITIVE — AB
PH: 8 (ref 5.0–8.0)
Specific Gravity, Urine: 1.02 (ref 1.005–1.030)

## 2016-09-26 LAB — COMPREHENSIVE METABOLIC PANEL
ALBUMIN: 3.8 g/dL (ref 3.5–5.0)
ALK PHOS: 150 U/L — AB (ref 38–126)
ALT: 12 U/L — AB (ref 14–54)
ANION GAP: 10 (ref 5–15)
AST: 22 U/L (ref 15–41)
BILIRUBIN TOTAL: 0.5 mg/dL (ref 0.3–1.2)
BUN: 20 mg/dL (ref 6–20)
CO2: 31 mmol/L (ref 22–32)
CREATININE: 3.78 mg/dL — AB (ref 0.44–1.00)
Calcium: 9 mg/dL (ref 8.9–10.3)
Chloride: 95 mmol/L — ABNORMAL LOW (ref 101–111)
GFR calc Af Amer: 12 mL/min — ABNORMAL LOW (ref >60)
GFR calc non Af Amer: 10 mL/min — ABNORMAL LOW (ref >60)
GLUCOSE: 116 mg/dL — AB (ref 65–99)
Potassium: 5.1 mmol/L (ref 3.5–5.1)
Sodium: 136 mmol/L (ref 135–145)
TOTAL PROTEIN: 7.9 g/dL (ref 6.5–8.1)

## 2016-09-26 LAB — I-STAT CG4 LACTIC ACID, ED: Lactic Acid, Venous: 1.56 mmol/L (ref 0.5–1.9)

## 2016-09-26 MED ORDER — CEPHALEXIN 250 MG PO CAPS
500.0000 mg | ORAL_CAPSULE | Freq: Once | ORAL | Status: AC
  Administered 2016-09-26: 500 mg via ORAL
  Filled 2016-09-26: qty 2

## 2016-09-26 MED ORDER — ONDANSETRON 4 MG PO TBDP: 4.0000 mg | ORAL_TABLET | Freq: Three times a day (TID) | ORAL | 0 refills | Status: DC | PRN

## 2016-09-26 MED ORDER — CEPHALEXIN 500 MG PO CAPS: 500.0000 mg | ORAL_CAPSULE | Freq: Two times a day (BID) | ORAL | 0 refills | Status: AC

## 2016-09-26 MED ORDER — SULFAMETHOXAZOLE-TRIMETHOPRIM 800-160 MG PO TABS: 1.0000 | ORAL_TABLET | Freq: Once | ORAL | Status: DC

## 2016-09-26 NOTE — ED Provider Notes (Signed)
[facility] DEPT Provider Note   CSN: [id number] Arrival date & time: 09/26/16  1428     History   Chief Complaint Chief Complaint  Patient presents with  . Recurrent UTI  . Diarrhea  . Emesis    HPI [name] is a 83 y.o. female. Chief complaint is vomiting and diarrhea  HPI this is an 83 year old female who presents with daughter from home. History of end-stage renal disease. She is on Tuesday Thursday Saturday dialysis. She was dialyzed today without difficulty.  Upon returning home, daughter states that she complained of nausea and vomited once. She had a loose stool. Daughter is concerned stating that she noticed that her mother had "an odor about her when she is in the restroom". Daughter states that in the past she has had urinary tract infections although she voids minimally frequently. Symptoms have included diarrhea and vomiting with her previous UTIs  Past Medical History:  Diagnosis Date  . Anxiety   . Bronchitis   . C. difficile diarrhea 02/01/2015  . Cough   . Depression    lexapro  . Diabetes mellitus    type 2  . Dyslipidemia    remote hx/notes 10/06/2009  . ESRD (end stage renal disease) on dialysis [facility])    Dr Coladonato/[place]  . GERD (gastroesophageal reflux disease)   . Hypertension   . 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 Osteoarthritis   . Staphylococcus aureus bacteremia with sepsis (HCC)    thought from HD catheter  . Thrombocytopenia (HCC) 11/16/2014  . Traumatic hematoma of left forearm 05/25/2015  . UTI (urinary tract infection) 05/2015    Patient Active Problem List   Diagnosis Date Noted  . Transient alteration of awareness 12/31/2015  . Medicare annual wellness visit, subsequent 07/24/2015  . Advanced care planning/counseling discussion 07/24/2015  . Hearing loss 07/24/2015  . Recurrent UTI 04/09/2015  . C. difficile diarrhea 02/01/2015  . Rash and nonspecific skin eruption 01/18/2015  . Depression   .  Pressure ulcer 11/20/2014  . Thrombocytopenia (HCC) 11/16/2014  . ANEMIA, IRON DEFICIENCY, CHRONIC 07/01/2010  . UNSPECIFIED VISUAL LOSS 04/16/2010  . HYPOTENSION, ORTHOSTATIC 11/13/2009  . Dementia 03/08/2009  . HLD (hyperlipidemia) 01/11/2009  . ESRD on hemodialysis (HCC) 06/08/2007  . Osteoarthrosis, unspecified whether generalized or localized, unspecified site 02/25/2007  . History of diabetes mellitus, type II 01/12/2007  . Essential hypertension 01/12/2007  . GERD 01/12/2007    Past Surgical History:  Procedure Laterality Date  . ABDOMINAL HYSTERECTOMY     remote hx/notes 10/06/2009  . ARTERIOVENOUS GRAFT PLACEMENT Left 07/2004   forearm/notes 10/13/2010  . ARTERIOVENOUS GRAFT PLACEMENT Left 03/2006   upper armnotes 10/13/2010  . AVGG REMOVAL Right 04/10/2015   Procedure: REMOVAL OF RIGHT ARM  ARTERIOVENOUS GORETEX GRAFT (AVGG);  Surgeon: Fransisco Hertz, MD;  Location: [facility] OR;  Service: Vascular;  Laterality: Right;  . EYE SURGERY     Cataract extraction  . INSERTION OF DIALYSIS CATHETER Left 04/10/2015   Procedure: INSERTION OF DIALYSIS CATHETER LEFT GROIN;  Surgeon: Fransisco Hertz, MD;  Location: [facility] OR;  Service: Vascular;  Laterality: Left;  . PERIPHERAL VASCULAR CATHETERIZATION Right 02/18/2015   Procedure: A/V Shuntogram/Fistulagram;  Surgeon: Annice Needy, MD;  Location: [facility] INVASIVE CV LAB;  Service: Cardiovascular;  Laterality: Right;  . PERIPHERAL VASCULAR CATHETERIZATION N/A 02/18/2015   Procedure: A/V Shunt Intervention;  Surgeon: Annice Needy, MD;  Location: [facility] INVASIVE CV LAB;  Service: Cardiovascular;  Laterality: N/A;  . PERIPHERAL  VASCULAR CATHETERIZATION N/A 03/06/2015   Procedure: Dialysis/Perma Catheter Insertion;  Surgeon: Annice Needy, MD;  Location: [facility] INVASIVE CV LAB;  Service: Cardiovascular;  Laterality: N/A;  . REMOVAL OF A DIALYSIS CATHETER Right 04/10/2015   Procedure: REMOVAL OF RIGHT GROIN DIALYSIS CATHETER;  Surgeon: Fransisco Hertz, MD;  Location: [facility] OR;   Service: Vascular;  Laterality: Right;  . REVISION OF ARTERIOVENOUS GORETEX GRAFT Right 03/08/2015   Procedure: REVISION OF ARTERIOVENOUS GORETEX GRAFT;  Surgeon: Renford Dills, MD;  Location: [facility] ORS;  Service: Vascular;  Laterality: Right;  . THROMBECTOMY AND REVISION OF ARTERIOVENTOUS (AV) GORETEX  GRAFT Left 12/2004; 01/2006; 03/12/2009   forearmnotes 10/13/2010; forearmnotes 10/13/2010; upper arm/notes 03/19/2009  . VITRECTOMY Left 03/2001   with membrane peel/notes 10/13/2010    OB History    No data available       Home Medications    Prior to Admission medications   Medication Sig Start Date End Date Taking? Authorizing Provider  acetaminophen (TYLENOL) 500 MG tablet Take 500 mg by mouth 2 (two) times daily as needed for mild pain.     Historical Provider, MD  amLODipine (NORVASC) 5 MG tablet TAKE ONE-HALF TABLET BY MOUTH ON SUNDAY, MONDAY, WEDNESDAY, AND FRIDAY. TAKE ONE TABLET BY MOUTH ALL OTHER DAYS OF THE WEEK 05/18/16   Eustaquio Boyden, MD  aspirin 81 MG tablet Take 81 mg by mouth daily.    Historical Provider, MD  carvedilol (COREG) 3.125 MG tablet TAKE ONE TABLET BY MOUTH TWICE DAILY WITH MEALS 04/20/16   Eustaquio Boyden, MD  cephALEXin (KEFLEX) 500 MG capsule Take 1 capsule (500 mg total) by mouth 2 (two) times daily. 09/26/16 10/03/16  Rolland Porter, MD  cinacalcet (SENSIPAR) 30 MG tablet Take 30 mg by mouth daily.    Historical Provider, MD  Dorzolamide HCl-Timolol Mal (COSOPT OP) Place 1 drop into both eyes daily.     Historical Provider, MD  escitalopram (LEXAPRO) 10 MG tablet TAKE ONE-HALF TABLET BY MOUTH ONCE DAILY 09/04/16   Eustaquio Boyden, MD  famotidine (PEPCID) 20 MG tablet Take 1 tablet (20 mg total) by mouth daily. 07/27/16   Eustaquio Boyden, MD  folic acid-vitamin b complex-vitamin c-selenium-zinc (DIALYVITE) 3 MG TABS Take 1 tablet by mouth daily.      Historical Provider, MD  memantine (NAMENDA) 5 MG tablet Take 1 tablet (5 mg total) by mouth 2 (two) times  daily. On evenings of dialysis, take 2 namenda 01/22/16   Eustaquio Boyden, MD  multivitamin (RENA-VIT) TABS tablet TAKE ONE TABLET BY MOUTH AT BEDTIME 05/14/15   Eustaquio Boyden, MD  ondansetron (ZOFRAN ODT) 4 MG disintegrating tablet Take 1 tablet (4 mg total) by mouth every 8 (eight) hours as needed for nausea. 09/26/16   Rolland Porter, MD  sevelamer carbonate (RENVELA) 800 MG tablet Take 2 tablets (1,600 mg total) by mouth 3 (three) times daily with meals. 10/24/15   Eustaquio Boyden, MD    Family History Family History  Problem Relation Age of Onset  . Hypertension Mother     Social History Social History  Substance Use Topics  . Smoking status: Never Smoker  . Smokeless tobacco: Never Used  . Alcohol use No     Allergies   Aricept [donepezil hcl]   Review of Systems Review of Systems  Constitutional: Negative for appetite change, chills, diaphoresis, fatigue and fever.  HENT: Negative for mouth sores, sore throat and trouble swallowing.   Eyes: Negative for visual disturbance.  Respiratory: Negative for cough, chest tightness,  shortness of breath and wheezing.   Cardiovascular: Negative for chest pain.  Gastrointestinal: Positive for diarrhea, nausea and vomiting. Negative for abdominal distention and abdominal pain.  Endocrine: Negative for polydipsia, polyphagia and polyuria.  Genitourinary: Negative for dysuria, frequency and hematuria.  Musculoskeletal: Negative for gait problem.  Skin: Negative for color change, pallor and rash.  Neurological: Negative for dizziness, syncope, light-headedness and headaches.  Hematological: Does not bruise/bleed easily.  Psychiatric/Behavioral: Negative for behavioral problems and confusion.     Physical Exam Updated Vital Signs BP (!) 113/53 (BP Location: Left Arm)   Pulse 78   Temp 98.9 F (37.2 C) (Oral)   Resp 16   SpO2 100%   Physical Exam  Constitutional: She is oriented to person, place, and time. She appears  well-developed and well-nourished. No distress.  Thin Elderly-appearing. Awake. Lucid.  HENT:  Head: Normocephalic.  Eyes: Conjunctivae are normal. Pupils are equal, round, and reactive to light. No scleral icterus.  Neck: Normal range of motion. Neck supple. No thyromegaly present.  Cardiovascular: Normal rate and regular rhythm.  Exam reveals no gallop and no friction rub.   No murmur heard. Pulmonary/Chest: Effort normal and breath sounds normal. No respiratory distress. She has no wheezes. She has no rales.  Abdominal: Soft. Bowel sounds are normal. She exhibits no distension. There is no tenderness. There is no rebound.  Soft. Nondistended. Regular active bowel sounds.  Musculoskeletal: Normal range of motion.  Neurological: She is alert and oriented to person, place, and time.  Skin: Skin is warm and dry. No rash noted.  Psychiatric: She has a normal mood and affect. Her behavior is normal.     ED Treatments / Results  Labs (all labs ordered are listed, but only abnormal results are displayed) Labs Reviewed  COMPREHENSIVE METABOLIC PANEL - Abnormal; Notable for the following:       Result Value   Chloride 95 (*)    Glucose, Bld 116 (*)    Creatinine, Ser 3.78 (*)    ALT 12 (*)    Alkaline Phosphatase 150 (*)    GFR calc non Af Amer 10 (*)    GFR calc Af Amer 12 (*)    All other components within normal limits  CBC WITH DIFFERENTIAL/PLATELET - Abnormal; Notable for the following:    WBC 3.8 (*)    RDW 17.9 (*)    Platelets 92 (*)    All other components within normal limits  URINALYSIS, ROUTINE W REFLEX MICROSCOPIC - Abnormal; Notable for the following:    APPearance TURBID (*)    Glucose, UA 100 (*)    Hgb urine dipstick MODERATE (*)    Bilirubin Urine MODERATE (*)    Ketones, ur 15 (*)    Protein, ur >300 (*)    Nitrite POSITIVE (*)    Leukocytes, UA MODERATE (*)    All other components within normal limits  URINALYSIS, MICROSCOPIC (REFLEX) - Abnormal; Notable  for the following:    Bacteria, UA MANY (*)    Squamous Epithelial / LPF 0-5 (*)    All other components within normal limits  I-STAT CG4 LACTIC ACID, ED    EKG  EKG Interpretation None       Radiology No results found.  Procedures Procedures (including critical care time)  Medications Ordered in ED Medications  cephALEXin (KEFLEX) capsule 500 mg (not administered)     Initial Impression / Assessment and Plan / ED Course  I have reviewed the triage vital signs and the  nursing notes.  Pertinent labs & imaging results that were available during my care of the patient were reviewed by me and considered in my medical decision making (see chart for details).   patient's daughter states that this has been UTI in the past with similar symptoms. Patient makes minimal urine. Will straight cath and reassess.  Patient's blood pressure at bedside 116 systolic. She does not have orthostatic symptoms standing.  Final Clinical Impressions(s) / ED Diagnoses   Final diagnoses:  Nausea vomiting and diarrhea  Acute cystitis without hematuria    We will renal dose Keflex for her. Zofran for nausea. She is not tachycardic or hypotensive here. One low reading post dialysis but 1:15 or better in the emergency room. Normal lactate. Appropriate for discharge. Again, we will renal dose her Keflex  New Prescriptions New Prescriptions   CEPHALEXIN (KEFLEX) 500 MG CAPSULE    Take 1 capsule (500 mg total) by mouth 2 (two) times daily.   ONDANSETRON (ZOFRAN ODT) 4 MG DISINTEGRATING TABLET    Take 1 tablet (4 mg total) by mouth every 8 (eight) hours as needed for nausea.     Rolland Porter, MD 09/26/16 [phone]

## 2016-09-26 NOTE — ED Notes (Signed)
Pt states she is not nauseated at this time.  Patient does not make any urine at this time.  Patient on dialysis.

## 2016-09-26 NOTE — Discharge Instructions (Signed)
Keflex antibiotic prescription for infection. Morning and evening for 7 days. This is a dosage adjustment because of her hemodialysis.  Zofran dissolving tablet as needed for nausea at home.

## 2016-09-26 NOTE — ED Triage Notes (Signed)
Pt had loose BM yesterday and one episode of vomiting, pt family member states last time she had that episode, she had a UTI. Pt does dialysis, T TH Sat, went today, without issue. Pt does not make urine. Pt denies any symptoms at this time, denies pain.

## 2016-09-28 ENCOUNTER — Telehealth: Payer: Self-pay

## 2016-09-28 DIAGNOSIS — Z992 Dependence on renal dialysis: Secondary | ICD-10-CM | POA: Diagnosis not present

## 2016-09-28 DIAGNOSIS — E1129 Type 2 diabetes mellitus with other diabetic kidney complication: Secondary | ICD-10-CM | POA: Diagnosis not present

## 2016-09-28 DIAGNOSIS — N186 End stage renal disease: Secondary | ICD-10-CM | POA: Diagnosis not present

## 2016-09-28 NOTE — Telephone Encounter (Signed)
uanble to reach pt or pts daughter for update.

## 2016-09-28 NOTE — Telephone Encounter (Signed)
PLEASE NOTE: All timestamps contained within this report are represented as [place] Standard Time. CONFIDENTIALTY NOTICE: This fax transmission is intended only for the addressee. It contains information that is legally privileged, confidential or otherwise protected from use or disclosure. If you are not the intended recipient, you are strictly prohibited from reviewing, disclosing, copying using or disseminating any of this information or taking any action in reliance on or regarding this information. If you have received this fax in error, please notify us immediately by telephone so that we can arrange for its return to [place]. Phone: [phone], Toll-Free: [phone], Fax: [phone] Page: 1 of 2 Call Id: [id number] [facility] Day - Client TELEPHONE ADVICE RECORD [facility] Medical Call Center Patient Name: [name] Gender: Female DOB: 28-Jun-1928 Age: 83 Y 2 M 14 D Return Phone Number: [phone] (Primary), [phone] (Secondary) City/State/Zip: [place] [address] Client [facility] Day - Client Client Site [facility] - Day Physician Eustaquio Boyden - MD Who Is Calling Patient / Member / Family / Caregiver Call Type Triage / Clinical Caller Name [name] Relationship To Patient Daughter Return Phone Number [phone] (Primary) Chief Complaint Vomiting Reason for Call Symptomatic / Request for Health Information Initial Comment Caller states mother has diarrhea and vomiting, thinks she has a UTI. Appointment Disposition EMR Appointment Not Necessary Info pasted into Epic Yes Nurse Assessment Nurse: Debera Lat, RN, Tinnie Gens Date/Time Lamount Cohen Time): 09/26/2016 1:09:38 PM Confirm and document reason for call. If symptomatic, describe symptoms. ---Caller states mother has diarrhea and vomiting, thinks she has a UTI. Symptoms started yesterday. Vomited once today. One bout of diarrhea today. No fever. Dialysis  3 days a week (T, TH, Saturday). Does the PT have any chronic conditions? (i.e. diabetes, asthma, etc.) ---Yes List chronic conditions. ---CKD, diabetes type 2, HTN Guidelines Guideline Title Affirmed Question Urinary Symptoms All other urine symptoms Disp. Time Lamount Cohen Time) Disposition Final User 09/26/2016 1:18:07 PM See Physician within 24 Hours Yes Debera Lat, RN, Tinnie Gens Referrals GO TO FACILITY UNDECIDED Care Advice Given Per Guideline SEE PCP WITHIN 2 WEEKS: SEE PHYSICIAN WITHIN 24 HOURS: * IF OFFICE WILL BE CLOSED AND NO PCP TRIAGE: You need to be seen within the next 24 hours. An urgent care center is often a good source of care if your doctor's office is closed. CALL BACK IF: * Fever occurs * Unable to urinate and bladder feels full * You become worse. CARE ADVICE given per Urinary Symptoms (Adult) guideline. PLEASE NOTE: All timestamps contained within this report are represented as [place] Standard Time. CONFIDENTIALTY NOTICE: This fax transmission is intended only for the addressee. It contains information that is legally privileged, confidential or otherwise protected from use or disclosure. If you are not the intended recipient, you are strictly prohibited from reviewing, disclosing, copying using or disseminating any of this information or taking any action in reliance on or regarding this information. If you have received this fax in error, please notify us immediately by telephone so that we can arrange for its return to [place]. Phone: [phone], Toll-Free: [phone], Fax: [phone] Page: 2 of 2 Call Id: [id number] Comments User: Ed Blalock, RN Date/Time Lamount Cohen Time): 09/26/2016 1:19:28 PM Caller reports that patient normally has vomiting and diarrhea as symptom if she begins to get a UTI. Patient is unable to urinate and has to be straight cathed due to chronic kidney disease.

## 2016-09-28 NOTE — Telephone Encounter (Signed)
Last OV 07/2016. pls advise

## 2016-09-28 NOTE — Telephone Encounter (Signed)
Seen at ER

## 2016-09-28 NOTE — Telephone Encounter (Signed)
PLEASE NOTE: All timestamps contained within this report are represented as [place] Standard Time. CONFIDENTIALTY NOTICE: This fax transmission is intended only for the addressee. It contains information that is legally privileged, confidential or otherwise protected from use or disclosure. If you are not the intended recipient, you are strictly prohibited from reviewing, disclosing, copying using or disseminating any of this information or taking any action in reliance on or regarding this information. If you have received this fax in error, please notify us immediately by telephone so that we can arrange for its return to [place]. Phone: [phone], Toll-Free: [phone], Fax: [phone] Page: 1 of 2 Call Id: [id number] [facility] Night - Client TELEPHONE ADVICE RECORD [facility] Medical Call Center Patient Name: [name] Gender: Female DOB: [birth year] Age: 83 Y 2 M 14 D Return Phone Number: [phone] (Secondary) City/State/Zip: [place] [employer] Client [facility] Night - Client Client Site [facility] [place] - Night Physician Eustaquio Boyden - MD Who Is Calling Patient / Member / Family / Caregiver Call Type Triage / Clinical Caller Name [name] Relationship To Patient Daughter Return Phone Number [phone] (Secondary) Chief Complaint Vomiting Reason for Call Symptomatic / Request for Health Information Initial Comment Caller states mother has diarrhea and vomiting, thinks she has a UTI Nurse Assessment Nurse: Debera Lat, RN, Tinnie Gens Date/Time Lamount Cohen Time): 09/26/2016 1:50:08 PM Confirm and document reason for call. If symptomatic, describe symptoms. ---Caller states mother has diarrhea and vomiting, thinks she has a UTI. Symptoms started yesterday. Vomited once today. One bout of diarrhea today. No fever. Dialysis 3 days a week (T, TH, Saturday). Does the PT have any chronic conditions? (i.e. diabetes, asthma,  etc.) ---Yes List chronic conditions. ---CKD, diabetes type 2, HTN Guidelines Guideline Title Affirmed Question Urinary Symptoms All other urine symptoms Disp. Time Lamount Cohen Time) Disposition Final User 09/26/2016 1:51:53 PM See Physician within 24 Hours Yes Debera Lat, RN, Tinnie Gens Referrals GO TO FACILITY UNDECIDED Care Advice Given Per Guideline SEE PHYSICIAN WITHIN 24 HOURS: * IF OFFICE WILL BE CLOSED AND NO PCP TRIAGE: You need to be seen within the next 24 hours. An urgent care center is often a good source of care if your doctor's office is closed. CALL BACK IF: * Fever occurs * Unable to urinate and bladder feels full * You become worse. CARE ADVICE given per Urinary Symptoms (Adult) guideline. Comments User: Ed Blalock, RN Date/Time Lamount Cohen Time): 09/26/2016 1:52:38 PM Caller reports that patient normally has vomiting and diarrhea as symptom if she begins to get a UTI. Patient is unable to urinate and has to be straight cathed due to chronic kidney disease. PLEASE NOTE: All timestamps contained within this report are represented as [place] Standard Time. CONFIDENTIALTY NOTICE: This fax transmission is intended only for the addressee. It contains information that is legally privileged, confidential or otherwise protected from use or disclosure. If you are not the intended recipient, you are strictly prohibited from reviewing, disclosing, copying using or disseminating any of this information or taking any action in reliance on or regarding this information. If you have received this fax in error, please notify us immediately by telephone so that we can arrange for its return to [place]. Phone: [phone], Toll-Free: [phone], Fax: [phone] Page: 2 of 2 Call Id: [id number]

## 2016-09-29 DIAGNOSIS — D631 Anemia in chronic kidney disease: Secondary | ICD-10-CM | POA: Diagnosis not present

## 2016-09-29 DIAGNOSIS — E1129 Type 2 diabetes mellitus with other diabetic kidney complication: Secondary | ICD-10-CM | POA: Diagnosis not present

## 2016-09-29 DIAGNOSIS — N2581 Secondary hyperparathyroidism of renal origin: Secondary | ICD-10-CM | POA: Diagnosis not present

## 2016-09-29 DIAGNOSIS — N186 End stage renal disease: Secondary | ICD-10-CM | POA: Diagnosis not present

## 2016-10-01 DIAGNOSIS — E1129 Type 2 diabetes mellitus with other diabetic kidney complication: Secondary | ICD-10-CM | POA: Diagnosis not present

## 2016-10-01 DIAGNOSIS — N2581 Secondary hyperparathyroidism of renal origin: Secondary | ICD-10-CM | POA: Diagnosis not present

## 2016-10-01 DIAGNOSIS — N186 End stage renal disease: Secondary | ICD-10-CM | POA: Diagnosis not present

## 2016-10-01 DIAGNOSIS — D631 Anemia in chronic kidney disease: Secondary | ICD-10-CM | POA: Diagnosis not present

## 2016-10-03 ENCOUNTER — Telehealth: Payer: Self-pay | Admitting: Family Medicine

## 2016-10-03 DIAGNOSIS — N2581 Secondary hyperparathyroidism of renal origin: Secondary | ICD-10-CM | POA: Diagnosis not present

## 2016-10-03 DIAGNOSIS — D631 Anemia in chronic kidney disease: Secondary | ICD-10-CM | POA: Diagnosis not present

## 2016-10-03 DIAGNOSIS — N186 End stage renal disease: Secondary | ICD-10-CM | POA: Diagnosis not present

## 2016-10-03 DIAGNOSIS — E1129 Type 2 diabetes mellitus with other diabetic kidney complication: Secondary | ICD-10-CM | POA: Diagnosis not present

## 2016-10-03 NOTE — Telephone Encounter (Signed)
plz notify daughter - I received request for diabetic foot ware.  Will need OV for foot exam - and actually may not qualify for diabetic shoes pending exam.

## 2016-10-05 NOTE — Telephone Encounter (Signed)
Daughter notified of Dr. Timoteo ExposeG's comments and OV scheduled for this Friday 10/09/16

## 2016-10-06 DIAGNOSIS — N2581 Secondary hyperparathyroidism of renal origin: Secondary | ICD-10-CM | POA: Diagnosis not present

## 2016-10-06 DIAGNOSIS — E1129 Type 2 diabetes mellitus with other diabetic kidney complication: Secondary | ICD-10-CM | POA: Diagnosis not present

## 2016-10-06 DIAGNOSIS — D631 Anemia in chronic kidney disease: Secondary | ICD-10-CM | POA: Diagnosis not present

## 2016-10-06 DIAGNOSIS — N186 End stage renal disease: Secondary | ICD-10-CM | POA: Diagnosis not present

## 2016-10-07 DIAGNOSIS — Z992 Dependence on renal dialysis: Secondary | ICD-10-CM | POA: Diagnosis not present

## 2016-10-07 DIAGNOSIS — N186 End stage renal disease: Secondary | ICD-10-CM | POA: Diagnosis not present

## 2016-10-07 DIAGNOSIS — T82858A Stenosis of vascular prosthetic devices, implants and grafts, initial encounter: Secondary | ICD-10-CM | POA: Diagnosis not present

## 2016-10-08 DIAGNOSIS — N2581 Secondary hyperparathyroidism of renal origin: Secondary | ICD-10-CM | POA: Diagnosis not present

## 2016-10-08 DIAGNOSIS — N186 End stage renal disease: Secondary | ICD-10-CM | POA: Diagnosis not present

## 2016-10-08 DIAGNOSIS — D631 Anemia in chronic kidney disease: Secondary | ICD-10-CM | POA: Diagnosis not present

## 2016-10-08 DIAGNOSIS — E1129 Type 2 diabetes mellitus with other diabetic kidney complication: Secondary | ICD-10-CM | POA: Diagnosis not present

## 2016-10-09 ENCOUNTER — Encounter: Payer: Self-pay | Admitting: Family Medicine

## 2016-10-09 ENCOUNTER — Ambulatory Visit (INDEPENDENT_AMBULATORY_CARE_PROVIDER_SITE_OTHER): Payer: Medicare Other | Admitting: Family Medicine

## 2016-10-09 VITALS — BP 124/60 | HR 62 | Temp 98.6°F

## 2016-10-09 DIAGNOSIS — E1121 Type 2 diabetes mellitus with diabetic nephropathy: Secondary | ICD-10-CM

## 2016-10-09 LAB — HEMOGLOBIN A1C: HEMOGLOBIN A1C: 5.9 % (ref 4.6–6.5)

## 2016-10-09 NOTE — Patient Instructions (Signed)
A1c today Repeat foot exam today We wil lfax forms today.

## 2016-10-09 NOTE — Assessment & Plan Note (Addendum)
Check A1c today.  Off diabetic medication. Foot exam today. I do think patient qualifies and needs diabetic shoes. Recently saw podiatrist who agrees. Forms filled out today.

## 2016-10-09 NOTE — Progress Notes (Signed)
BP 124/60 (BP Location: Left Arm, Patient Position: Sitting, Cuff Size: Normal)   Pulse 62   Temp 98.6 F (37 C) (Oral)   SpO2 94%    CC: diabetic foot exam Subjective:    Patient ID: [name], female    DOB: [birth year], 83 y.o.   MRN: [id number]  HPI: [name] is a 83 y.o. female presenting on 10/09/2016 for foot exam (Dr. Reece Agar has form)   Would like diabetic shoe forms filled out today.   DM - regularly does not check sugars. Compliant with antihyperglycemic regimen which includes: diet controlled. h/o ESRD and diabetic nephropathy. She no longer makes urine. Denies low sugars or hypoglycemic symptoms. Denies paresthesias. Last diabetic eye exam 06/2016. Pneumovax: 2016. Prevnar: 2014.  Lab Results  Component Value Date   HGBA1C 5.2 07/19/2015   Diabetic Foot Exam - Simple   Simple Foot Form Diabetic Foot exam was performed with the following findings:  Yes 10/09/2016 11:45 AM  Visual Inspection No deformities, no ulcerations, no other skin breakdown bilaterally:  Yes Sensation Testing See comments:  Yes Pulse Check See comments:  Yes Comments Diminished DP bilaterally Decreased sensation to monofilament testing Collapse of longitudinal arch bilaterally with over pronation      Relevant past medical, surgical, family and social history reviewed and updated as indicated. Interim medical history since our last visit reviewed. Allergies and medications reviewed and updated. Outpatient Medications Prior to Visit  Medication Sig Dispense Refill  . acetaminophen (TYLENOL) 500 MG tablet Take 500 mg by mouth 2 (two) times daily as needed for mild pain.     [name] amLODipine (NORVASC) 5 MG tablet TAKE ONE-HALF TABLET BY MOUTH ON SUNDAY, MONDAY, WEDNESDAY, AND FRIDAY. TAKE ONE TABLET BY MOUTH ALL OTHER DAYS OF THE WEEK 30 tablet 3  . aspirin 81 MG tablet Take 81 mg by mouth daily.    . carvedilol (COREG) 3.125 MG tablet TAKE ONE TABLET BY MOUTH TWICE DAILY WITH MEALS 180  tablet 1  . cinacalcet (SENSIPAR) 30 MG tablet Take 30 mg by mouth daily.    . Dorzolamide HCl-Timolol Mal (COSOPT OP) Place 1 drop into both eyes daily.     [name] escitalopram (LEXAPRO) 10 MG tablet TAKE ONE-HALF TABLET BY MOUTH ONCE DAILY 45 tablet 3  . famotidine (PEPCID) 20 MG tablet Take 1 tablet (20 mg total) by mouth daily.    . folic acid-vitamin b complex-vitamin c-selenium-zinc (DIALYVITE) 3 MG TABS Take 1 tablet by mouth daily.      . memantine (NAMENDA) 5 MG tablet Take one tablet twice daily. Only take 2 tablets (10mg ) in evening on dialysis days 180 tablet 1  . multivitamin (RENA-VIT) TABS tablet TAKE ONE TABLET BY MOUTH AT BEDTIME 90 tablet 3  . ondansetron (ZOFRAN ODT) 4 MG disintegrating tablet Take 1 tablet (4 mg total) by mouth every 8 (eight) hours as needed for nausea. 6 tablet 0  . sevelamer carbonate (RENVELA) 800 MG tablet Take 2 tablets (1,600 mg total) by mouth 3 (three) times daily with meals. 180 tablet 1   No facility-administered medications prior to visit.      Per HPI unless specifically indicated in ROS section below Review of Systems     Objective:    BP 124/60 (BP Location: Left Arm, Patient Position: Sitting, Cuff Size: Normal)   Pulse 62   Temp 98.6 F (37 C) (Oral)   SpO2 94%   Wt Readings from Last 3 Encounters:  07/27/16 122 lb (55.3  kg)  01/22/16 118 lb 4 oz (53.6 kg)  12/30/15 123 lb 8 oz (56 kg)    Physical Exam  Constitutional: She appears well-developed and well-nourished. No distress.  In wheelchair  Musculoskeletal:  See HPI  Nursing note and vitals reviewed.  Results for orders placed or performed during the hospital encounter of 09/26/16  Comprehensive metabolic panel  Result Value Ref Range   Sodium 136 135 - 145 mmol/L   Potassium 5.1 3.5 - 5.1 mmol/L   Chloride 95 (L) 101 - 111 mmol/L   CO2 31 22 - 32 mmol/L   Glucose, Bld 116 (H) 65 - 99 mg/dL   BUN 20 6 - 20 mg/dL   Creatinine, Ser 1.613.78 (H) 0.44 - 1.00 mg/dL   Calcium  9.0 8.9 - 09.610.3 mg/dL   Total Protein 7.9 6.5 - 8.1 g/dL   Albumin 3.8 3.5 - 5.0 g/dL   AST 22 15 - 41 U/L   ALT 12 (L) 14 - 54 U/L   Alkaline Phosphatase 150 (H) 38 - 126 U/L   Total Bilirubin 0.5 0.3 - 1.2 mg/dL   GFR calc non Af Amer 10 (L) >60 mL/min   GFR calc Af Amer 12 (L) >60 mL/min   Anion gap 10 5 - 15  CBC with Differential  Result Value Ref Range   WBC 3.8 (L) 4.0 - 10.5 K/uL   RBC 4.14 3.87 - 5.11 MIL/uL   Hemoglobin 12.4 12.0 - 15.0 g/dL   HCT 04.537.6 40.936.0 - 81.146.0 %   MCV 90.8 78.0 - 100.0 fL   MCH 30.0 26.0 - 34.0 pg   MCHC 33.0 30.0 - 36.0 g/dL   RDW 91.417.9 (H) 78.211.5 - 95.615.5 %   Platelets 92 (L) 150 - 400 K/uL   Neutrophils Relative % 53 %   Neutro Abs 2.0 1.7 - 7.7 K/uL   Lymphocytes Relative 36 %   Lymphs Abs 1.4 0.7 - 4.0 K/uL   Monocytes Relative 7 %   Monocytes Absolute 0.3 0.1 - 1.0 K/uL   Eosinophils Relative 4 %   Eosinophils Absolute 0.2 0.0 - 0.7 K/uL   Basophils Relative 1 %   Basophils Absolute 0.0 0.0 - 0.1 K/uL  Urinalysis, Routine w reflex microscopic  Result Value Ref Range   Color, Urine YELLOW YELLOW   APPearance TURBID (A) CLEAR   Specific Gravity, Urine 1.020 1.005 - 1.030   pH 8.0 5.0 - 8.0   Glucose, UA 100 (A) NEGATIVE mg/dL   Hgb urine dipstick MODERATE (A) NEGATIVE   Bilirubin Urine MODERATE (A) NEGATIVE   Ketones, ur 15 (A) NEGATIVE mg/dL   Protein, ur >213>300 (A) NEGATIVE mg/dL   Nitrite POSITIVE (A) NEGATIVE   Leukocytes, UA MODERATE (A) NEGATIVE  Urinalysis, Microscopic (reflex)  Result Value Ref Range   RBC / HPF TOO NUMEROUS TO COUNT 0 - 5 RBC/hpf   WBC, UA TOO NUMEROUS TO COUNT 0 - 5 WBC/hpf   Bacteria, UA MANY (A) NONE SEEN   Squamous Epithelial / LPF 0-5 (A) NONE SEEN  I-Stat CG4 Lactic Acid, ED  Result Value Ref Range   Lactic Acid, Venous 1.56 0.5 - 1.9 mmol/L      Assessment & Plan:   Problem List Items Addressed This Visit    Controlled type 2 diabetes mellitus with diabetic nephropathy, without long-term current use  of insulin (HCC) - Primary    Check A1c today.  Off diabetic medication. Foot exam today. I do think patient qualifies and needs  diabetic shoes. Recently saw podiatrist who agrees. Forms filled out today.       Relevant Orders   Hemoglobin A1c       Follow up plan: No Follow-up on file.  Eustaquio Boyden, MD

## 2016-10-10 DIAGNOSIS — D631 Anemia in chronic kidney disease: Secondary | ICD-10-CM | POA: Diagnosis not present

## 2016-10-10 DIAGNOSIS — E1129 Type 2 diabetes mellitus with other diabetic kidney complication: Secondary | ICD-10-CM | POA: Diagnosis not present

## 2016-10-10 DIAGNOSIS — N2581 Secondary hyperparathyroidism of renal origin: Secondary | ICD-10-CM | POA: Diagnosis not present

## 2016-10-10 DIAGNOSIS — N186 End stage renal disease: Secondary | ICD-10-CM | POA: Diagnosis not present

## 2016-10-12 ENCOUNTER — Encounter: Payer: Self-pay | Admitting: *Deleted

## 2016-10-13 DIAGNOSIS — N186 End stage renal disease: Secondary | ICD-10-CM | POA: Diagnosis not present

## 2016-10-13 DIAGNOSIS — N2581 Secondary hyperparathyroidism of renal origin: Secondary | ICD-10-CM | POA: Diagnosis not present

## 2016-10-13 DIAGNOSIS — D631 Anemia in chronic kidney disease: Secondary | ICD-10-CM | POA: Diagnosis not present

## 2016-10-13 DIAGNOSIS — E1129 Type 2 diabetes mellitus with other diabetic kidney complication: Secondary | ICD-10-CM | POA: Diagnosis not present

## 2016-10-15 DIAGNOSIS — D631 Anemia in chronic kidney disease: Secondary | ICD-10-CM | POA: Diagnosis not present

## 2016-10-15 DIAGNOSIS — E1129 Type 2 diabetes mellitus with other diabetic kidney complication: Secondary | ICD-10-CM | POA: Diagnosis not present

## 2016-10-15 DIAGNOSIS — N186 End stage renal disease: Secondary | ICD-10-CM | POA: Diagnosis not present

## 2016-10-15 DIAGNOSIS — N2581 Secondary hyperparathyroidism of renal origin: Secondary | ICD-10-CM | POA: Diagnosis not present

## 2016-10-17 DIAGNOSIS — E1129 Type 2 diabetes mellitus with other diabetic kidney complication: Secondary | ICD-10-CM | POA: Diagnosis not present

## 2016-10-17 DIAGNOSIS — N2581 Secondary hyperparathyroidism of renal origin: Secondary | ICD-10-CM | POA: Diagnosis not present

## 2016-10-17 DIAGNOSIS — N186 End stage renal disease: Secondary | ICD-10-CM | POA: Diagnosis not present

## 2016-10-17 DIAGNOSIS — D631 Anemia in chronic kidney disease: Secondary | ICD-10-CM | POA: Diagnosis not present

## 2016-10-20 ENCOUNTER — Other Ambulatory Visit: Payer: Self-pay | Admitting: Family Medicine

## 2016-10-20 DIAGNOSIS — N186 End stage renal disease: Secondary | ICD-10-CM | POA: Diagnosis not present

## 2016-10-20 DIAGNOSIS — E1129 Type 2 diabetes mellitus with other diabetic kidney complication: Secondary | ICD-10-CM | POA: Diagnosis not present

## 2016-10-20 DIAGNOSIS — D631 Anemia in chronic kidney disease: Secondary | ICD-10-CM | POA: Diagnosis not present

## 2016-10-20 DIAGNOSIS — N2581 Secondary hyperparathyroidism of renal origin: Secondary | ICD-10-CM | POA: Diagnosis not present

## 2016-10-22 DIAGNOSIS — E1129 Type 2 diabetes mellitus with other diabetic kidney complication: Secondary | ICD-10-CM | POA: Diagnosis not present

## 2016-10-22 DIAGNOSIS — N186 End stage renal disease: Secondary | ICD-10-CM | POA: Diagnosis not present

## 2016-10-22 DIAGNOSIS — N2581 Secondary hyperparathyroidism of renal origin: Secondary | ICD-10-CM | POA: Diagnosis not present

## 2016-10-22 DIAGNOSIS — D631 Anemia in chronic kidney disease: Secondary | ICD-10-CM | POA: Diagnosis not present

## 2016-10-24 DIAGNOSIS — N186 End stage renal disease: Secondary | ICD-10-CM | POA: Diagnosis not present

## 2016-10-24 DIAGNOSIS — D631 Anemia in chronic kidney disease: Secondary | ICD-10-CM | POA: Diagnosis not present

## 2016-10-24 DIAGNOSIS — E1129 Type 2 diabetes mellitus with other diabetic kidney complication: Secondary | ICD-10-CM | POA: Diagnosis not present

## 2016-10-24 DIAGNOSIS — N2581 Secondary hyperparathyroidism of renal origin: Secondary | ICD-10-CM | POA: Diagnosis not present

## 2016-10-27 DIAGNOSIS — N186 End stage renal disease: Secondary | ICD-10-CM | POA: Diagnosis not present

## 2016-10-27 DIAGNOSIS — E1129 Type 2 diabetes mellitus with other diabetic kidney complication: Secondary | ICD-10-CM | POA: Diagnosis not present

## 2016-10-27 DIAGNOSIS — N2581 Secondary hyperparathyroidism of renal origin: Secondary | ICD-10-CM | POA: Diagnosis not present

## 2016-10-27 DIAGNOSIS — D631 Anemia in chronic kidney disease: Secondary | ICD-10-CM | POA: Diagnosis not present

## 2016-10-29 DIAGNOSIS — Z992 Dependence on renal dialysis: Secondary | ICD-10-CM | POA: Diagnosis not present

## 2016-10-29 DIAGNOSIS — N186 End stage renal disease: Secondary | ICD-10-CM | POA: Diagnosis not present

## 2016-10-29 DIAGNOSIS — E1129 Type 2 diabetes mellitus with other diabetic kidney complication: Secondary | ICD-10-CM | POA: Diagnosis not present

## 2016-10-29 DIAGNOSIS — D631 Anemia in chronic kidney disease: Secondary | ICD-10-CM | POA: Diagnosis not present

## 2016-10-29 DIAGNOSIS — N2581 Secondary hyperparathyroidism of renal origin: Secondary | ICD-10-CM | POA: Diagnosis not present

## 2016-10-30 DIAGNOSIS — Z7689 Persons encountering health services in other specified circumstances: Secondary | ICD-10-CM

## 2016-10-31 DIAGNOSIS — N2581 Secondary hyperparathyroidism of renal origin: Secondary | ICD-10-CM | POA: Diagnosis not present

## 2016-10-31 DIAGNOSIS — E1129 Type 2 diabetes mellitus with other diabetic kidney complication: Secondary | ICD-10-CM | POA: Diagnosis not present

## 2016-10-31 DIAGNOSIS — N186 End stage renal disease: Secondary | ICD-10-CM | POA: Diagnosis not present

## 2016-10-31 DIAGNOSIS — D631 Anemia in chronic kidney disease: Secondary | ICD-10-CM | POA: Diagnosis not present

## 2016-11-03 DIAGNOSIS — N2581 Secondary hyperparathyroidism of renal origin: Secondary | ICD-10-CM | POA: Diagnosis not present

## 2016-11-03 DIAGNOSIS — D631 Anemia in chronic kidney disease: Secondary | ICD-10-CM | POA: Diagnosis not present

## 2016-11-03 DIAGNOSIS — E1129 Type 2 diabetes mellitus with other diabetic kidney complication: Secondary | ICD-10-CM | POA: Diagnosis not present

## 2016-11-03 DIAGNOSIS — N186 End stage renal disease: Secondary | ICD-10-CM | POA: Diagnosis not present

## 2016-11-05 DIAGNOSIS — D631 Anemia in chronic kidney disease: Secondary | ICD-10-CM | POA: Diagnosis not present

## 2016-11-05 DIAGNOSIS — E1129 Type 2 diabetes mellitus with other diabetic kidney complication: Secondary | ICD-10-CM | POA: Diagnosis not present

## 2016-11-05 DIAGNOSIS — N186 End stage renal disease: Secondary | ICD-10-CM | POA: Diagnosis not present

## 2016-11-05 DIAGNOSIS — N2581 Secondary hyperparathyroidism of renal origin: Secondary | ICD-10-CM | POA: Diagnosis not present

## 2016-11-07 DIAGNOSIS — N186 End stage renal disease: Secondary | ICD-10-CM | POA: Diagnosis not present

## 2016-11-07 DIAGNOSIS — E1129 Type 2 diabetes mellitus with other diabetic kidney complication: Secondary | ICD-10-CM | POA: Diagnosis not present

## 2016-11-07 DIAGNOSIS — D631 Anemia in chronic kidney disease: Secondary | ICD-10-CM | POA: Diagnosis not present

## 2016-11-07 DIAGNOSIS — N2581 Secondary hyperparathyroidism of renal origin: Secondary | ICD-10-CM | POA: Diagnosis not present

## 2016-11-10 DIAGNOSIS — D631 Anemia in chronic kidney disease: Secondary | ICD-10-CM | POA: Diagnosis not present

## 2016-11-10 DIAGNOSIS — E1129 Type 2 diabetes mellitus with other diabetic kidney complication: Secondary | ICD-10-CM | POA: Diagnosis not present

## 2016-11-10 DIAGNOSIS — N186 End stage renal disease: Secondary | ICD-10-CM | POA: Diagnosis not present

## 2016-11-10 DIAGNOSIS — N2581 Secondary hyperparathyroidism of renal origin: Secondary | ICD-10-CM | POA: Diagnosis not present

## 2016-11-12 DIAGNOSIS — D631 Anemia in chronic kidney disease: Secondary | ICD-10-CM | POA: Diagnosis not present

## 2016-11-12 DIAGNOSIS — E1129 Type 2 diabetes mellitus with other diabetic kidney complication: Secondary | ICD-10-CM | POA: Diagnosis not present

## 2016-11-12 DIAGNOSIS — N2581 Secondary hyperparathyroidism of renal origin: Secondary | ICD-10-CM | POA: Diagnosis not present

## 2016-11-12 DIAGNOSIS — N186 End stage renal disease: Secondary | ICD-10-CM | POA: Diagnosis not present

## 2016-11-14 DIAGNOSIS — N186 End stage renal disease: Secondary | ICD-10-CM | POA: Diagnosis not present

## 2016-11-14 DIAGNOSIS — N2581 Secondary hyperparathyroidism of renal origin: Secondary | ICD-10-CM | POA: Diagnosis not present

## 2016-11-14 DIAGNOSIS — D631 Anemia in chronic kidney disease: Secondary | ICD-10-CM | POA: Diagnosis not present

## 2016-11-14 DIAGNOSIS — E1129 Type 2 diabetes mellitus with other diabetic kidney complication: Secondary | ICD-10-CM | POA: Diagnosis not present

## 2016-11-17 DIAGNOSIS — E1129 Type 2 diabetes mellitus with other diabetic kidney complication: Secondary | ICD-10-CM | POA: Diagnosis not present

## 2016-11-17 DIAGNOSIS — D631 Anemia in chronic kidney disease: Secondary | ICD-10-CM | POA: Diagnosis not present

## 2016-11-17 DIAGNOSIS — N186 End stage renal disease: Secondary | ICD-10-CM | POA: Diagnosis not present

## 2016-11-17 DIAGNOSIS — N2581 Secondary hyperparathyroidism of renal origin: Secondary | ICD-10-CM | POA: Diagnosis not present

## 2016-11-19 ENCOUNTER — Telehealth: Payer: Self-pay | Admitting: Family Medicine

## 2016-11-19 DIAGNOSIS — E1129 Type 2 diabetes mellitus with other diabetic kidney complication: Secondary | ICD-10-CM | POA: Diagnosis not present

## 2016-11-19 DIAGNOSIS — N2581 Secondary hyperparathyroidism of renal origin: Secondary | ICD-10-CM | POA: Diagnosis not present

## 2016-11-19 DIAGNOSIS — N186 End stage renal disease: Secondary | ICD-10-CM | POA: Diagnosis not present

## 2016-11-19 DIAGNOSIS — D631 Anemia in chronic kidney disease: Secondary | ICD-10-CM | POA: Diagnosis not present

## 2016-11-19 NOTE — Telephone Encounter (Signed)
[name] stopped by to drop off papers. States she had purchased airline tickets but due to having to stay with [name], wasn't able to make flight. She is trying to get a refund and the airline gave her forms to get filled out stating why she wasn't able to make flight. [name] is aware of possible charge. Forms placed in RX tower.

## 2016-11-19 NOTE — Telephone Encounter (Signed)
[name] stopped by to drop off papers. States she had purchased airline tickets but due to having to stay with [place], wasn't able to make flight. She is trying to get a refund and the airline gave her forms to get filled out stating why she wasn't able to make flight. [name] is aware of possible charge. Forms placed in RX tower. 

## 2016-11-20 NOTE — Telephone Encounter (Signed)
Gwendolyn said it was just for the routine care of her mother

## 2016-11-20 NOTE — Telephone Encounter (Signed)
Is there a specific event that happened making her unable to make flight? Or just routine care of mother? Forms ask about this.

## 2016-11-20 NOTE — Telephone Encounter (Signed)
Filled and in my out box. Pt will need to fill out rest of form.

## 2016-11-21 IMAGING — DX DG CHEST 2V
2 series · 2 of 2 positions shown · non-contrast
Comparison: 11/16/2014

CLINICAL DATA: Altered mental status.

EXAM:
CHEST  2 VIEW

[chest lat]
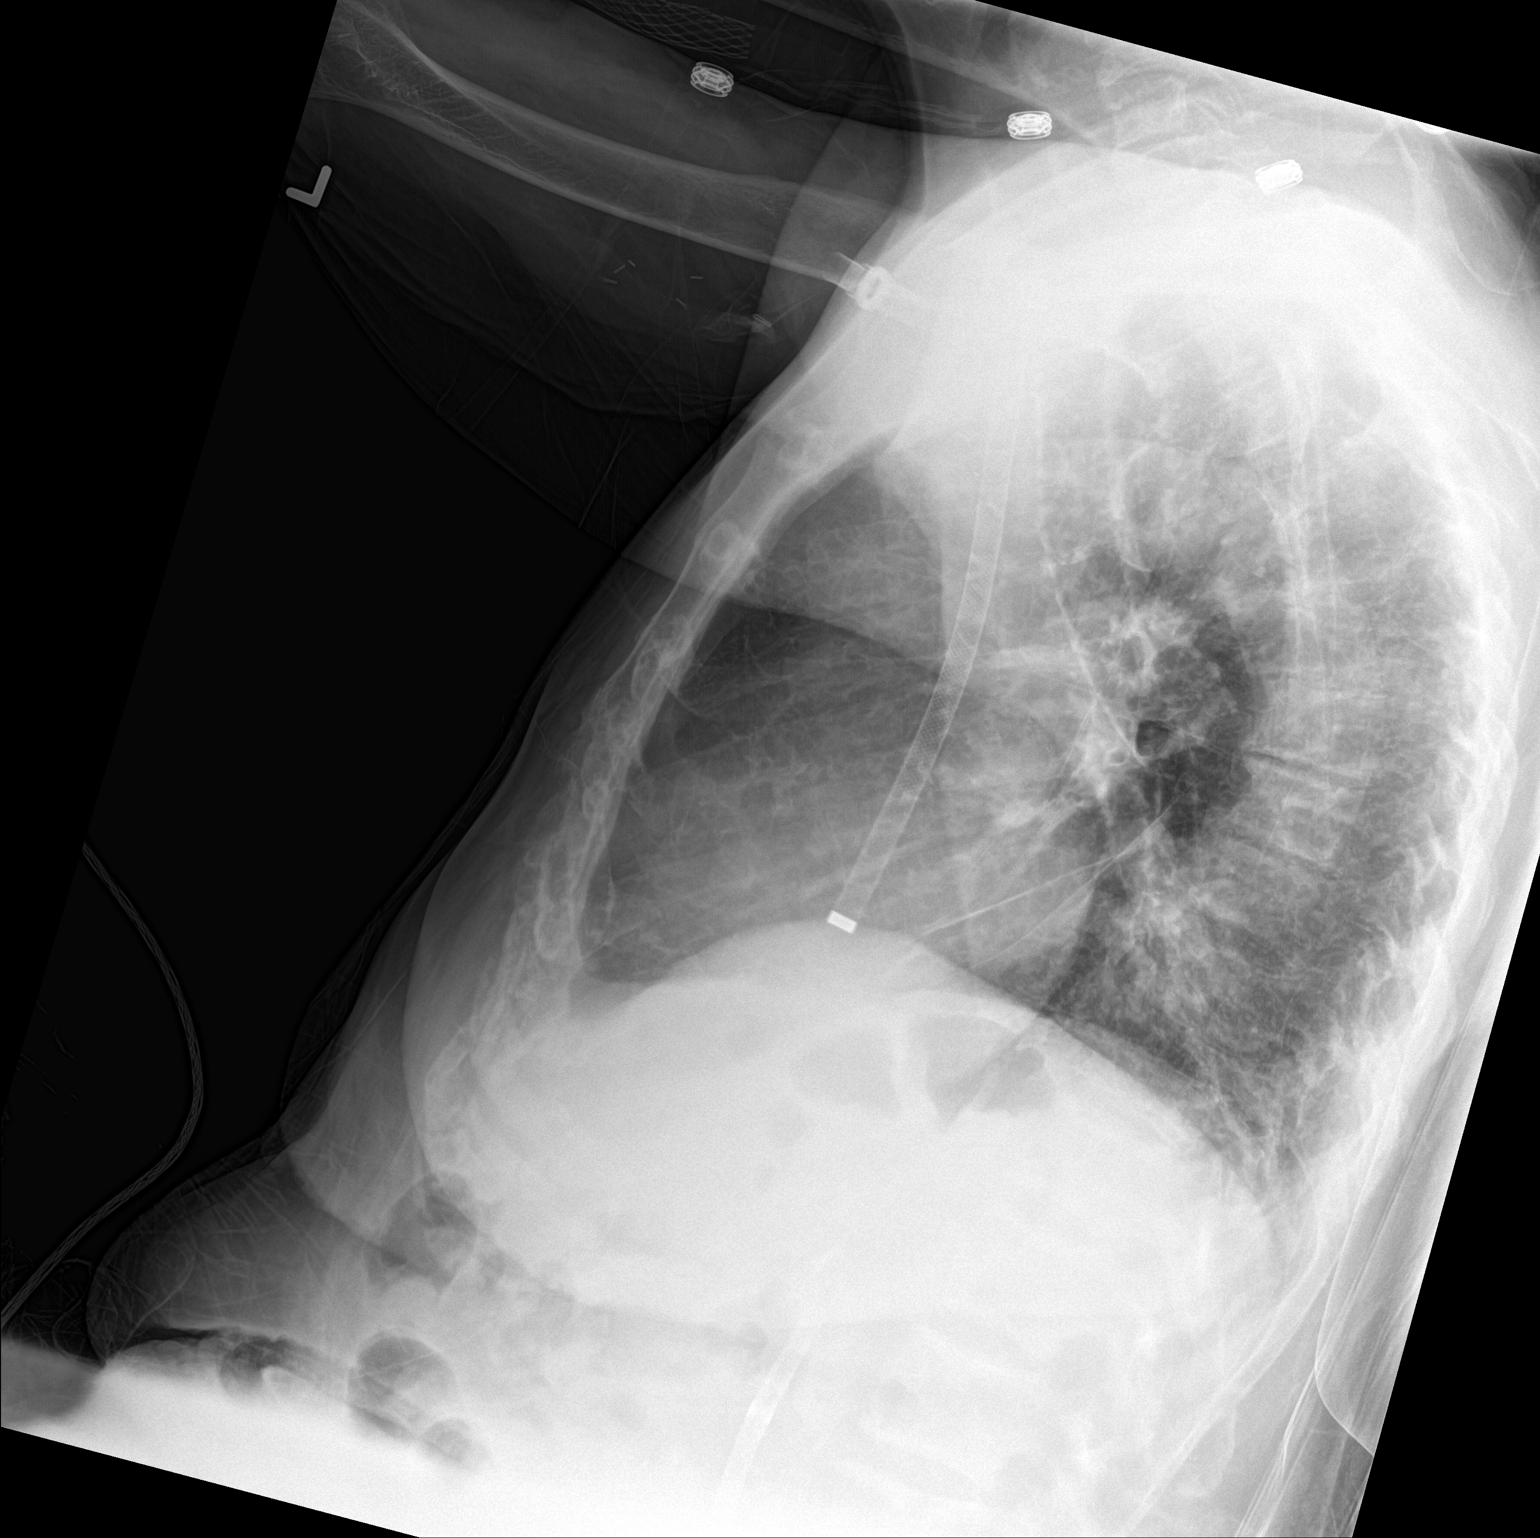

[chest ap]
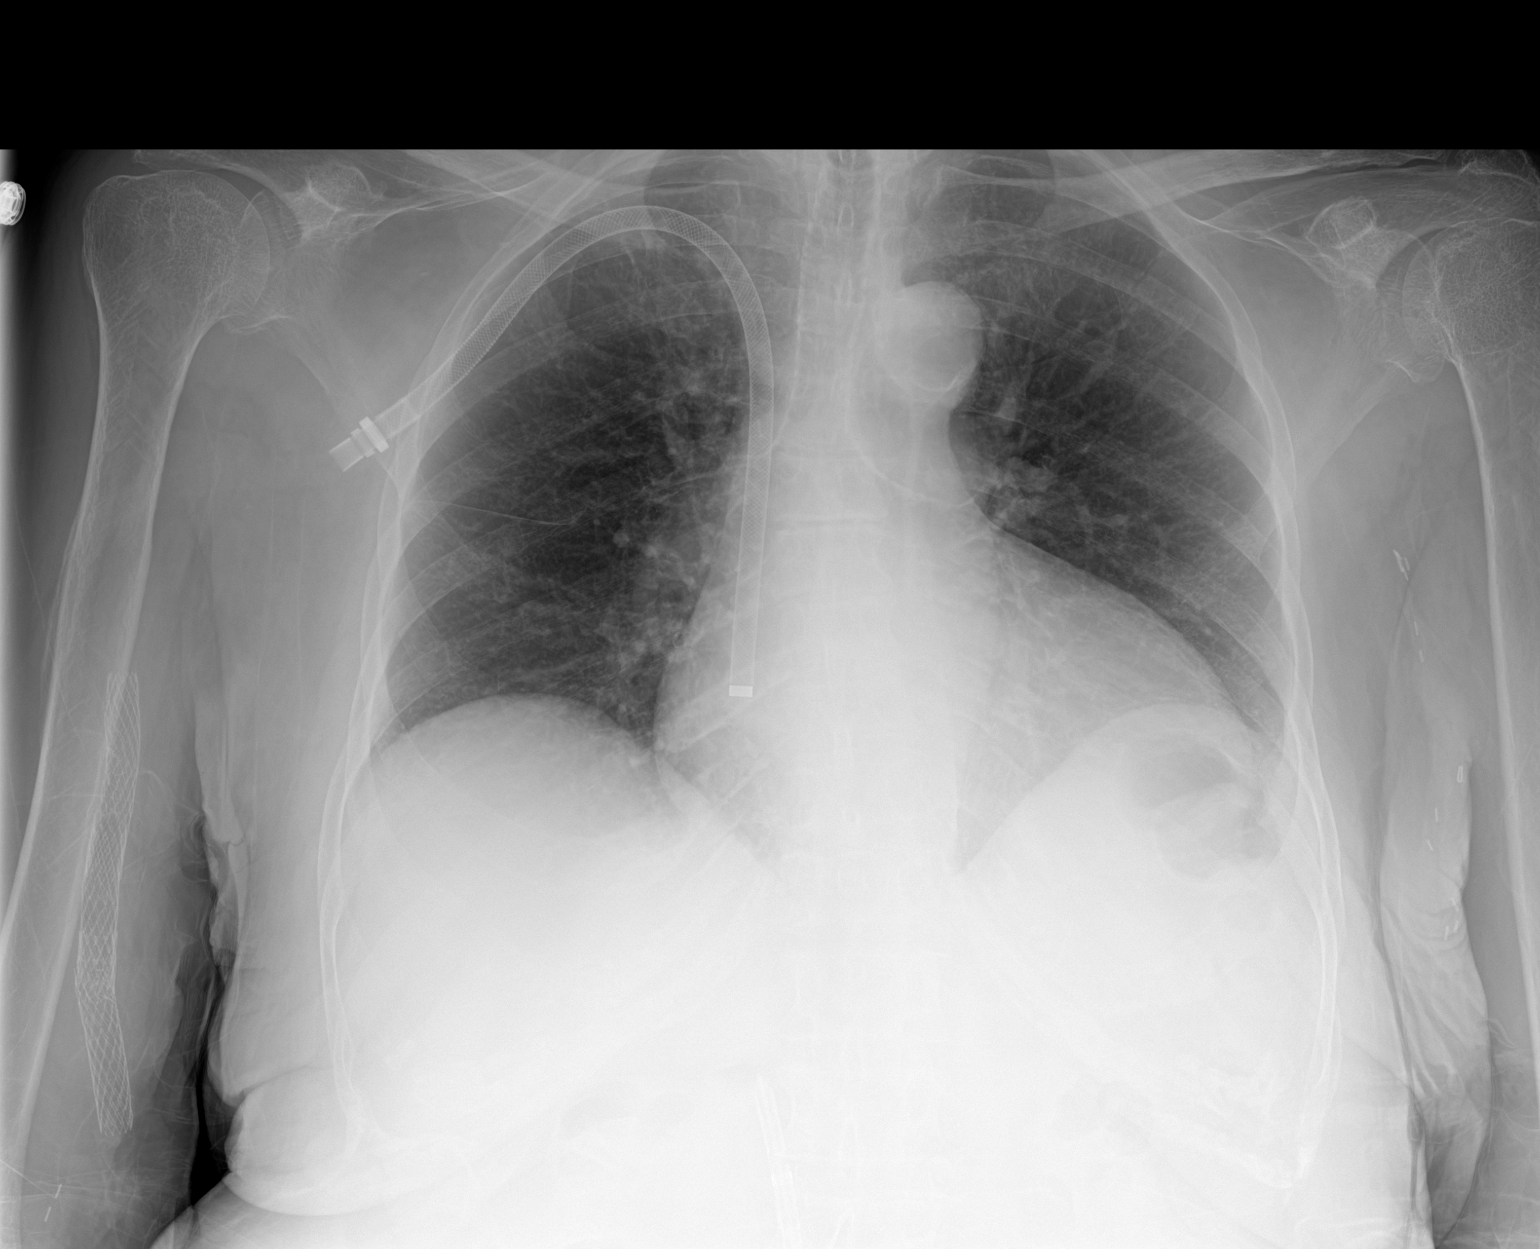

[2 of 2 positions shown; findings below may reference images not displayed]

FINDINGS: Unchanged large-bore right central venous catheter, tip at the
atrial caval junction. Cardiomegaly is unchanged. No consolidation,
pleural effusion or pneumothorax. No pulmonary edema. Vascular stent
seen in the right arm. The bones are under mineralized.
IMPRESSION: Stable cardiomegaly.  No acute pulmonary process.

## 2016-11-21 IMAGING — CT CT HEAD W/O CM
1 series · 16 of 30 positions shown, 20 images · non-contrast
Comparison: 11/16/2014

CLINICAL DATA: Confusion and disorientation today.

EXAM:
CT HEAD WITHOUT CONTRAST
TECHNIQUE: Contiguous axial images were obtained from the base of the skull
through the vertex without intravenous contrast.

[Series 2: head 5.0 h30s · axial · 0.39mm/px · z∈[-148,+7]mm · 16 of 35 slices shown, 20 images]
[im 2/35  brain]
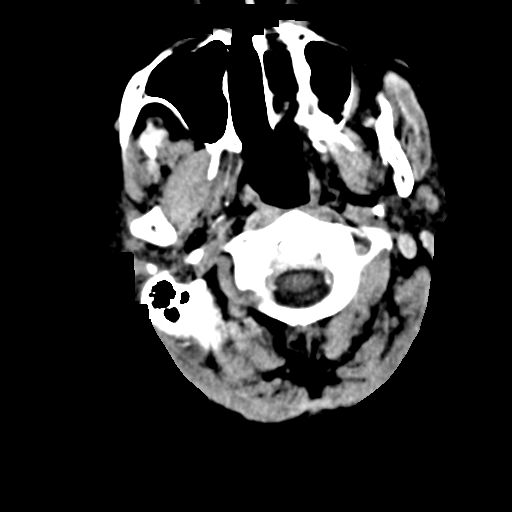
[im 2/35  bone]
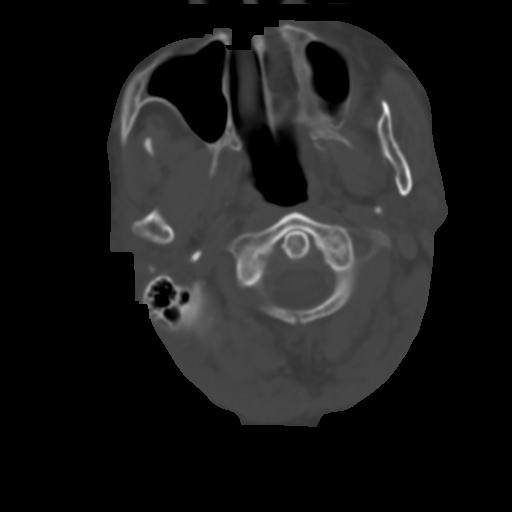
[im 4/35  brain]
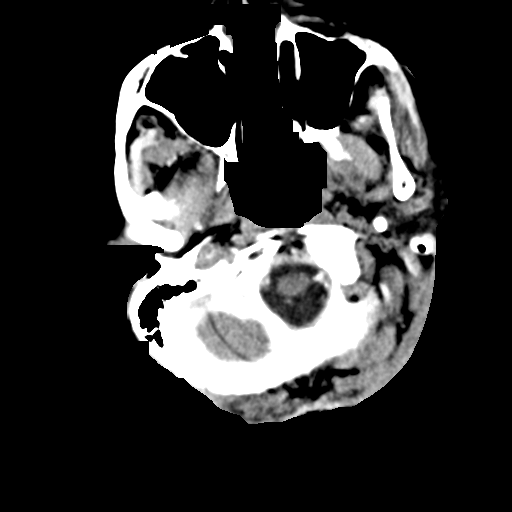
[im 6/35  brain]
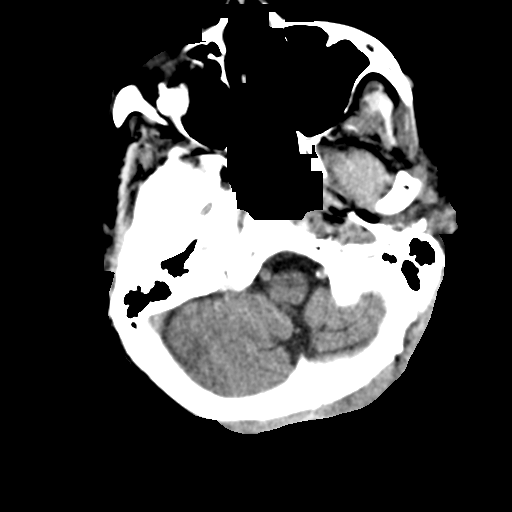
[im 9/35  brain]
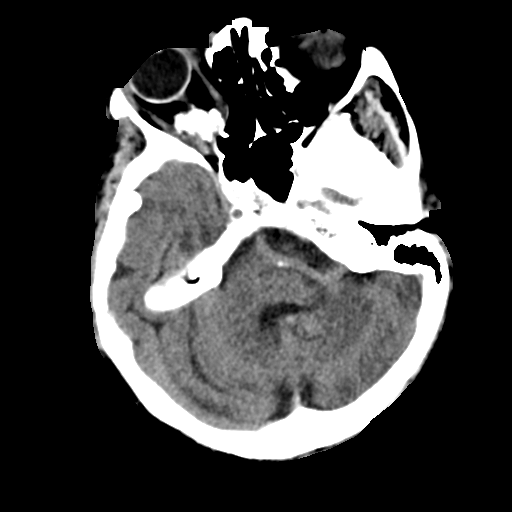
[im 10/35  brain]
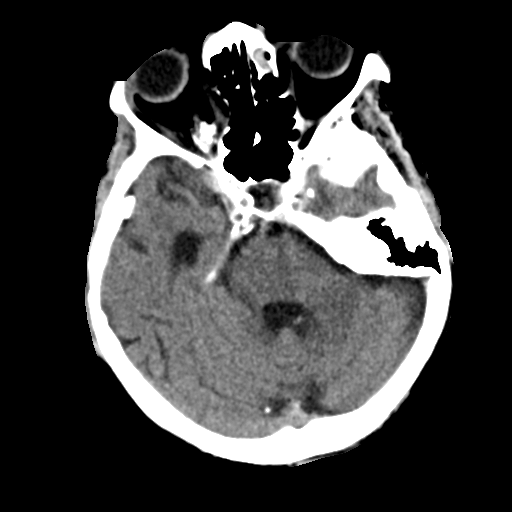
[im 10/35  bone]
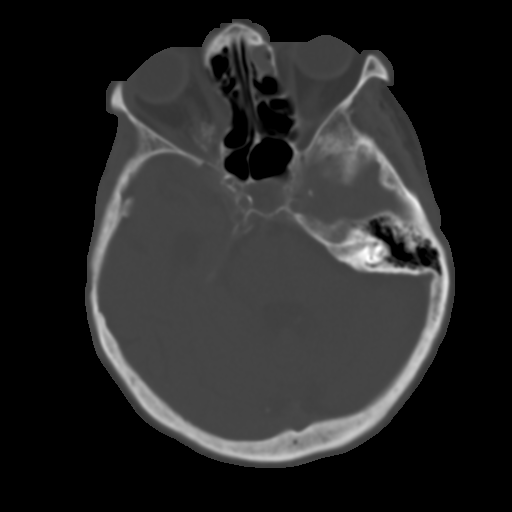
[im 12/35  brain]
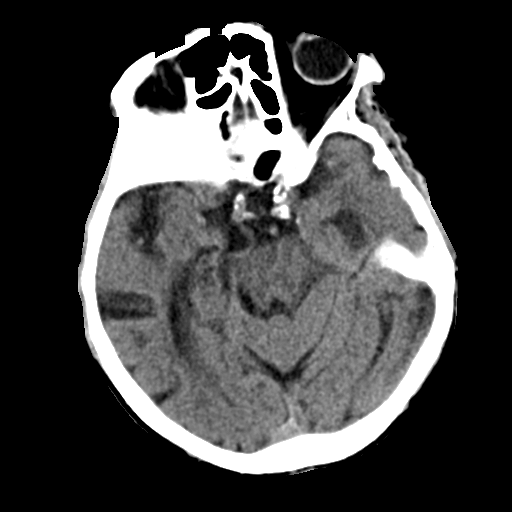
[im 15/35  brain]
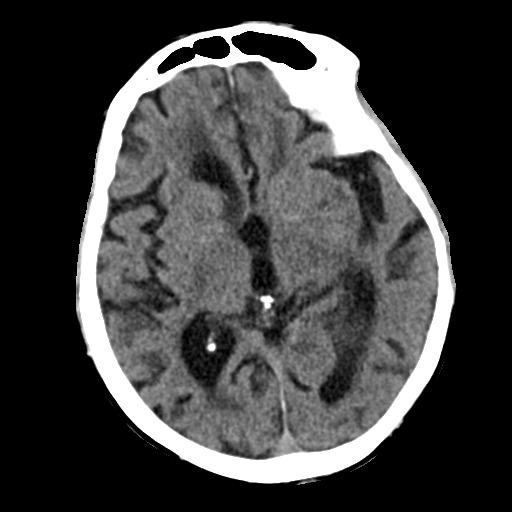
[im 17/35  brain]
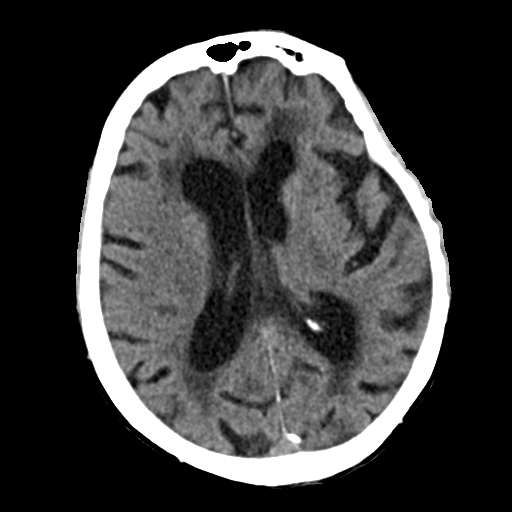
[im 18/35  brain]
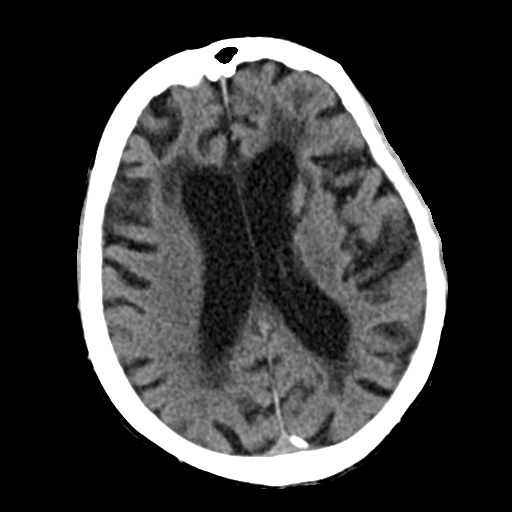
[im 18/35  bone]
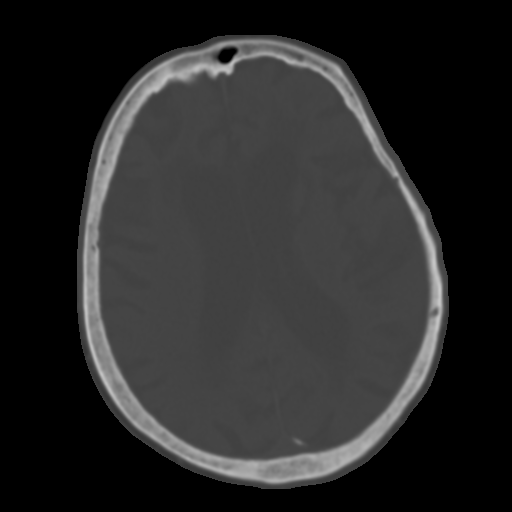
[im 20/35  brain]
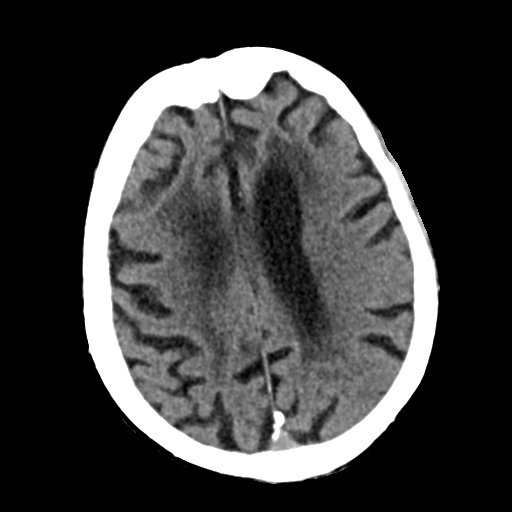
[im 23/35  brain]
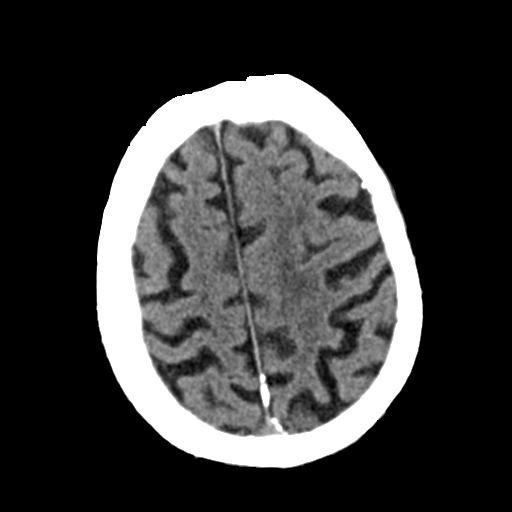
[im 25/35  brain]
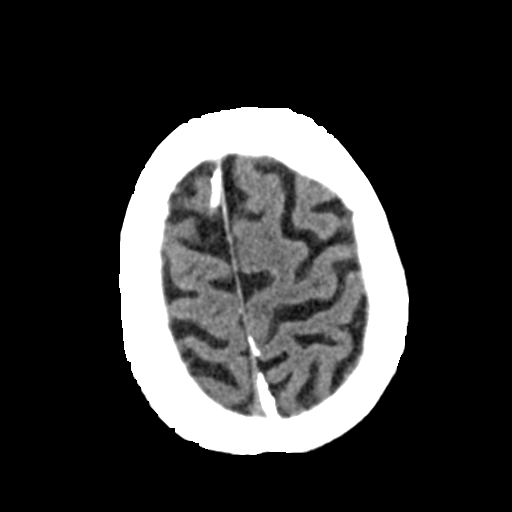
[im 26/35  brain]
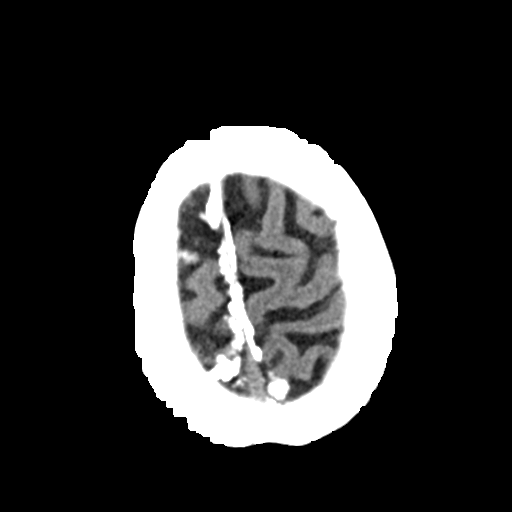
[im 26/35  bone]
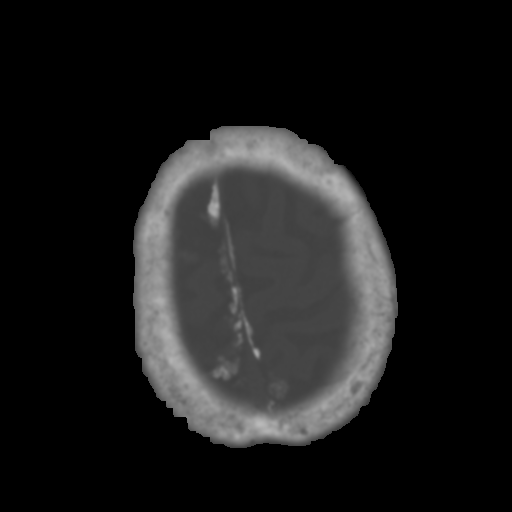
[im 29/35  brain]
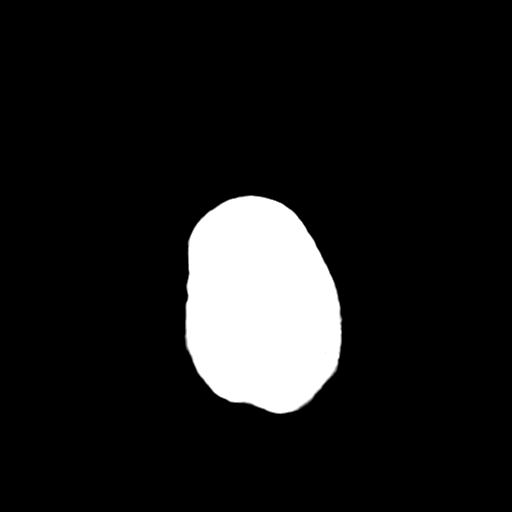
[im 31/35  brain]
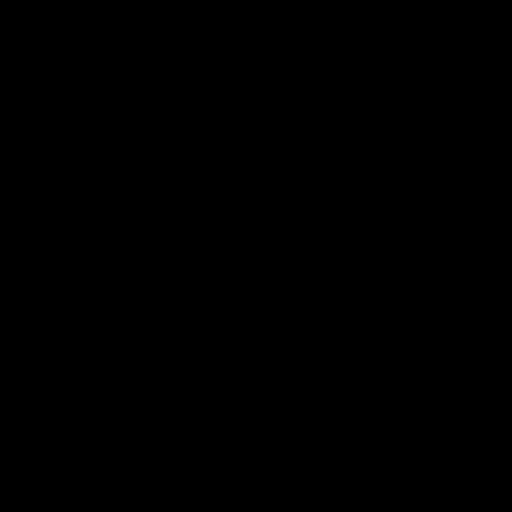
[im 33/35  brain]
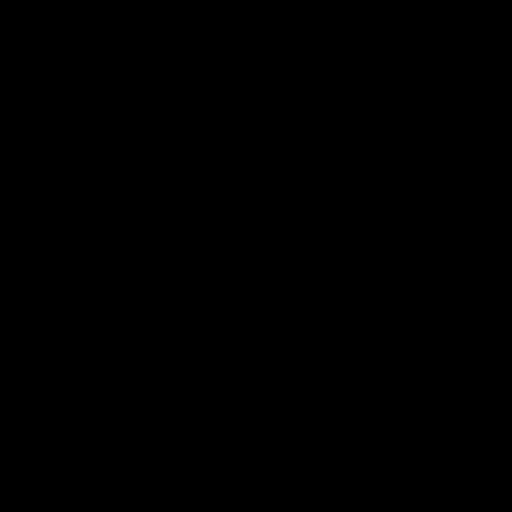

[16 of 30 positions shown; findings below may reference images not displayed]

FINDINGS: Stable age related cerebral atrophy, ventriculomegaly and
periventricular white matter disease. No extra-axial fluid
collections are identified. No CT findings for acute hemispheric
infarction or intracranial hemorrhage. No mass lesions. The
brainstem and cerebellum are normal. Stable calcified right orbital
mass.

No skull fracture or bone lesion. The paranasal sinuses are grossly
clear. Mild Stable left-sided ethmoid disease. Remote blow in type
fracture of the right orbit is noted.
IMPRESSION: 1. Stable age related cerebral atrophy, ventriculomegaly and
periventricular white matter disease. No acute intracranial
findings.
2. Stable calcified right orbital mass.
3. No acute bony findings.

## 2016-11-23 ENCOUNTER — Ambulatory Visit (INDEPENDENT_AMBULATORY_CARE_PROVIDER_SITE_OTHER): Payer: Medicare Other | Admitting: Internal Medicine

## 2016-11-23 ENCOUNTER — Encounter: Payer: Self-pay | Admitting: Internal Medicine

## 2016-11-23 ENCOUNTER — Ambulatory Visit: Payer: Medicare Other | Admitting: Internal Medicine

## 2016-11-23 VITALS — BP 116/68 | HR 63 | Temp 97.6°F | Wt 122.8 lb

## 2016-11-23 DIAGNOSIS — R195 Other fecal abnormalities: Secondary | ICD-10-CM | POA: Diagnosis not present

## 2016-11-23 DIAGNOSIS — R8290 Unspecified abnormal findings in urine: Secondary | ICD-10-CM

## 2016-11-23 MED ORDER — CIPROFLOXACIN HCL 250 MG PO TABS: 250.0000 mg | ORAL_TABLET | Freq: Two times a day (BID) | ORAL | 0 refills | Status: DC

## 2016-11-23 NOTE — Progress Notes (Signed)
Subjective:    Patient ID: [name], female    DOB: [birth year], 83 y.o.   MRN: [id number]  HPI  Pt presents to the clinic today with c/o urine odor. Her daughter reports she noticed this 1 week ago. She does not urinate independently, and has to be cathed to get a urine sample. She really does not make a lot of urine and is on dialysis. Her daughter denies fever, chills or low back pain. She has been having some loose stools, but this is not uncommon for her. She has a history of IBS. She has been taking Imodium with good relief.  Review of Systems      Past Medical History:  Diagnosis Date  . Anxiety   . Bronchitis   . C. difficile diarrhea 02/01/2015  . Cough   . Depression    lexapro  . Diabetes mellitus    type 2  . Dyslipidemia    remote hx/notes 10/06/2009  . ESRD (end stage renal disease) on dialysis [facility](HCC)    Dr Coladonato/[place]ell  . GERD (gastroesophageal reflux disease)   . Hypertension   . 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 Osteoarthritis   . Staphylococcus aureus bacteremia with sepsis (HCC)    thought from HD catheter  . Thrombocytopenia (HCC) 11/16/2014  . Traumatic hematoma of left forearm 05/25/2015  . UTI (urinary tract infection) 05/2015    Current Outpatient Prescriptions  Medication Sig Dispense Refill  . acetaminophen (TYLENOL) 500 MG tablet Take 500 mg by mouth 2 (two) times daily as needed for mild pain.     [name]. amLODipine (NORVASC) 5 MG tablet TAKE ONE-HALF TABLET BY MOUTH ON SUNDAY, MONDAY, WEDNESDAY, AND FRIDAY. TAKE ONE TABLET BY MOUTH ALL OTHER DAYS OF THE WEEK 30 tablet 3  . aspirin 81 MG tablet Take 81 mg by mouth daily.    . carvedilol (COREG) 3.125 MG tablet TAKE ONE TABLET BY MOUTH TWICE DAILY WITH MEALS 180 tablet 1  . cinacalcet (SENSIPAR) 30 MG tablet Take 30 mg by mouth daily.    . Dorzolamide HCl-Timolol Mal (COSOPT OP) Place 1 drop into both eyes daily.     [name]. escitalopram (LEXAPRO) 10 MG tablet TAKE ONE-HALF TABLET  BY MOUTH ONCE DAILY 45 tablet 3  . famotidine (PEPCID) 20 MG tablet Take 1 tablet (20 mg total) by mouth daily.    . folic acid-vitamin b complex-vitamin c-selenium-zinc (DIALYVITE) 3 MG TABS Take 1 tablet by mouth daily.      . memantine (NAMENDA) 5 MG tablet Take one tablet twice daily. Only take 2 tablets (10mg ) in evening on dialysis days 180 tablet 1  . multivitamin (RENA-VIT) TABS tablet TAKE ONE TABLET BY MOUTH AT BEDTIME 90 tablet 3  . ondansetron (ZOFRAN ODT) 4 MG disintegrating tablet Take 1 tablet (4 mg total) by mouth every 8 (eight) hours as needed for nausea. 6 tablet 0  . sevelamer carbonate (RENVELA) 800 MG tablet Take 2 tablets (1,600 mg total) by mouth 3 (three) times daily with meals. 180 tablet 1  . ciprofloxacin (CIPRO) 250 MG tablet Take 1 tablet (250 mg total) by mouth 2 (two) times daily. 10 tablet 0   No current facility-administered medications for this visit.     Allergies  Allergen Reactions  . Aricept [Donepezil Hcl] Other (See Comments)    Increased aggression    Family History  Problem Relation Age of Onset  . Hypertension Mother     Social History   Social History  .  Marital status: Married    Spouse name: N/A  . Number of children: N/A  . Years of education: N/A   Occupational History  . Not on file.   Social History Main Topics  . Smoking status: Never Smoker  . Smokeless tobacco: Never Used  . Alcohol use No  . Drug use: No  . Sexual activity: No   Other Topics Concern  . Not on file   Social History Narrative   Lives with husband and daughter [name] Shalayne Leach)   Occupation: retired, was Marine scientist   Activity: in wheelchair, no regular exercise   Diet: drinks water and gatorade, good fruits/vegetables, avoids salt in diet     Constitutional: Denies fever, malaise, fatigue, headache or abrupt weight changes.  Gastrointestinal: Pt reports diarrhea. Denies abdominal pain, bloating, constipation, or blood in the  stool.  GU: Pt reports urine odor. Denies urgency, frequency, pain with urination, burning sensation, blood in urine, discharge.   No other specific complaints in a complete review of systems (except as listed in HPI above).  Objective:   Physical Exam    BP 116/68   Pulse 63   Temp 97.6 F (36.4 C) (Tympanic)   Wt 122 lb 12 oz (55.7 kg)   SpO2 100%   BMI 21.74 kg/m  Wt Readings from Last 3 Encounters:  11/23/16 122 lb 12 oz (55.7 kg)  07/27/16 122 lb (55.3 kg)  01/22/16 118 lb 4 oz (53.6 kg)    General: Appears her stated age, chronically ill appearing, in NAD. Abdomen: Soft and nontender. Normal bowel sounds. No CVA tenderness.   BMET    Component Value Date/Time   NA 136 09/26/2016 1430   K 5.1 09/26/2016 1430   CL 95 (L) 09/26/2016 1430   CO2 31 09/26/2016 1430   GLUCOSE 116 (H) 09/26/2016 1430   GLUCOSE 111 02/07/2009   GLUCOSE 246 (H) 05/28/2006 1229   BUN 20 09/26/2016 1430   CREATININE 3.78 (H) 09/26/2016 1430   CALCIUM 9.0 09/26/2016 1430   CALCIUM 9.6 07/18/2012 1051   GFRNONAA 10 (L) 09/26/2016 1430   GFRAA 12 (L) 09/26/2016 1430    Lipid Panel     Component Value Date/Time   CHOL 200 07/19/2015 0856   TRIG 79.0 07/19/2015 0856   HDL 69.40 07/19/2015 0856   CHOLHDL 3 07/19/2015 0856   VLDL 15.8 07/19/2015 0856   LDLCALC 115 (H) 07/19/2015 0856    CBC    Component Value Date/Time   WBC 3.8 (L) 09/26/2016 1430   RBC 4.14 09/26/2016 1430   HGB 12.4 09/26/2016 1430   HCT 37.6 09/26/2016 1430   PLT 92 (L) 09/26/2016 1430   MCV 90.8 09/26/2016 1430   MCH 30.0 09/26/2016 1430   MCHC 33.0 09/26/2016 1430   RDW 17.9 (H) 09/26/2016 1430   LYMPHSABS 1.4 09/26/2016 1430   MONOABS 0.3 09/26/2016 1430   EOSABS 0.2 09/26/2016 1430   BASOSABS 0.0 09/26/2016 1430    Hgb A1C Lab Results  Component Value Date   HGBA1C 5.9 10/09/2016          Assessment & Plan:   Urine Odor, Loose Stool:  Unable to obtain urine specimen, she can not  urinate and can not get on the table to obtain a clean catch Will try to treat presumptive UTI per daughters report eRx for Cipro 250 mg BID x 5 days If no improvement, follow up with PCP  Return precautions discussed Nicki Reaper, NP

## 2016-11-23 NOTE — Patient Instructions (Signed)
Acute Urinary Retention, Female Urinary retention means you are unable to pee completely or at all (empty your bladder). Follow these instructions at home:  Drink enough fluids to keep your pee (urine) clear or pale yellow.  If you are sent home with a tube that drains the bladder (catheter), there will be a drainage bag attached to it. There are two types of bags. One is big that you can wear at night without having to empty it. One is smaller and needs to be emptied more often.  Keep the drainage bag emptied.  Keep the drainage bag lower than the tube.  Only take medicine as told by your doctor. Contact a doctor if:  You have a low-grade fever.  You have spasms or you are leaking pee when you have spasms. Get help right away if:  You have chills or a fever.  Your catheter stops draining pee.  Your catheter falls out.  You have increased bleeding that does not stop after you have rested and increased the amount of fluids you had been drinking. This information is not intended to replace advice given to you by your health care provider. Make sure you discuss any questions you have with your health care provider. Document Released: 11/04/2007 Document Revised: 10/24/2015 Document Reviewed: 10/27/2012 Elsevier Interactive Patient Education  2017 Elsevier Inc.  

## 2016-11-23 NOTE — Telephone Encounter (Signed)
Called Mickle AsperGwendolyn Townsend to let her know forms are ready for pick up. Unable to leave message, will try back later.

## 2016-11-24 DIAGNOSIS — D631 Anemia in chronic kidney disease: Secondary | ICD-10-CM | POA: Diagnosis not present

## 2016-11-24 DIAGNOSIS — N2581 Secondary hyperparathyroidism of renal origin: Secondary | ICD-10-CM | POA: Diagnosis not present

## 2016-11-24 DIAGNOSIS — E1129 Type 2 diabetes mellitus with other diabetic kidney complication: Secondary | ICD-10-CM | POA: Diagnosis not present

## 2016-11-24 DIAGNOSIS — N186 End stage renal disease: Secondary | ICD-10-CM | POA: Diagnosis not present

## 2016-11-25 NOTE — Telephone Encounter (Signed)
Spoke to pts daughter who states she has picked up forms

## 2016-11-26 DIAGNOSIS — E1129 Type 2 diabetes mellitus with other diabetic kidney complication: Secondary | ICD-10-CM | POA: Diagnosis not present

## 2016-11-26 DIAGNOSIS — N186 End stage renal disease: Secondary | ICD-10-CM | POA: Diagnosis not present

## 2016-11-26 DIAGNOSIS — N2581 Secondary hyperparathyroidism of renal origin: Secondary | ICD-10-CM | POA: Diagnosis not present

## 2016-11-26 DIAGNOSIS — D631 Anemia in chronic kidney disease: Secondary | ICD-10-CM | POA: Diagnosis not present

## 2016-11-28 DIAGNOSIS — E1129 Type 2 diabetes mellitus with other diabetic kidney complication: Secondary | ICD-10-CM | POA: Diagnosis not present

## 2016-11-28 DIAGNOSIS — N186 End stage renal disease: Secondary | ICD-10-CM | POA: Diagnosis not present

## 2016-11-28 DIAGNOSIS — D631 Anemia in chronic kidney disease: Secondary | ICD-10-CM | POA: Diagnosis not present

## 2016-11-28 DIAGNOSIS — Z992 Dependence on renal dialysis: Secondary | ICD-10-CM | POA: Diagnosis not present

## 2016-11-28 DIAGNOSIS — N2581 Secondary hyperparathyroidism of renal origin: Secondary | ICD-10-CM | POA: Diagnosis not present

## 2016-12-01 DIAGNOSIS — N2581 Secondary hyperparathyroidism of renal origin: Secondary | ICD-10-CM | POA: Diagnosis not present

## 2016-12-01 DIAGNOSIS — D631 Anemia in chronic kidney disease: Secondary | ICD-10-CM | POA: Diagnosis not present

## 2016-12-01 DIAGNOSIS — E1129 Type 2 diabetes mellitus with other diabetic kidney complication: Secondary | ICD-10-CM | POA: Diagnosis not present

## 2016-12-01 DIAGNOSIS — N186 End stage renal disease: Secondary | ICD-10-CM | POA: Diagnosis not present

## 2016-12-03 DIAGNOSIS — N2581 Secondary hyperparathyroidism of renal origin: Secondary | ICD-10-CM | POA: Diagnosis not present

## 2016-12-03 DIAGNOSIS — D631 Anemia in chronic kidney disease: Secondary | ICD-10-CM | POA: Diagnosis not present

## 2016-12-03 DIAGNOSIS — N186 End stage renal disease: Secondary | ICD-10-CM | POA: Diagnosis not present

## 2016-12-03 DIAGNOSIS — E1129 Type 2 diabetes mellitus with other diabetic kidney complication: Secondary | ICD-10-CM | POA: Diagnosis not present

## 2016-12-05 DIAGNOSIS — D631 Anemia in chronic kidney disease: Secondary | ICD-10-CM | POA: Diagnosis not present

## 2016-12-05 DIAGNOSIS — N2581 Secondary hyperparathyroidism of renal origin: Secondary | ICD-10-CM | POA: Diagnosis not present

## 2016-12-05 DIAGNOSIS — N186 End stage renal disease: Secondary | ICD-10-CM | POA: Diagnosis not present

## 2016-12-05 DIAGNOSIS — E1129 Type 2 diabetes mellitus with other diabetic kidney complication: Secondary | ICD-10-CM | POA: Diagnosis not present

## 2016-12-08 DIAGNOSIS — N2581 Secondary hyperparathyroidism of renal origin: Secondary | ICD-10-CM | POA: Diagnosis not present

## 2016-12-08 DIAGNOSIS — E1129 Type 2 diabetes mellitus with other diabetic kidney complication: Secondary | ICD-10-CM | POA: Diagnosis not present

## 2016-12-08 DIAGNOSIS — D631 Anemia in chronic kidney disease: Secondary | ICD-10-CM | POA: Diagnosis not present

## 2016-12-08 DIAGNOSIS — N186 End stage renal disease: Secondary | ICD-10-CM | POA: Diagnosis not present

## 2016-12-10 DIAGNOSIS — D631 Anemia in chronic kidney disease: Secondary | ICD-10-CM | POA: Diagnosis not present

## 2016-12-10 DIAGNOSIS — N186 End stage renal disease: Secondary | ICD-10-CM | POA: Diagnosis not present

## 2016-12-10 DIAGNOSIS — E1129 Type 2 diabetes mellitus with other diabetic kidney complication: Secondary | ICD-10-CM | POA: Diagnosis not present

## 2016-12-10 DIAGNOSIS — N2581 Secondary hyperparathyroidism of renal origin: Secondary | ICD-10-CM | POA: Diagnosis not present

## 2016-12-12 DIAGNOSIS — E1129 Type 2 diabetes mellitus with other diabetic kidney complication: Secondary | ICD-10-CM | POA: Diagnosis not present

## 2016-12-12 DIAGNOSIS — N2581 Secondary hyperparathyroidism of renal origin: Secondary | ICD-10-CM | POA: Diagnosis not present

## 2016-12-12 DIAGNOSIS — N186 End stage renal disease: Secondary | ICD-10-CM | POA: Diagnosis not present

## 2016-12-12 DIAGNOSIS — D631 Anemia in chronic kidney disease: Secondary | ICD-10-CM | POA: Diagnosis not present

## 2016-12-15 DIAGNOSIS — N186 End stage renal disease: Secondary | ICD-10-CM | POA: Diagnosis not present

## 2016-12-15 DIAGNOSIS — N2581 Secondary hyperparathyroidism of renal origin: Secondary | ICD-10-CM | POA: Diagnosis not present

## 2016-12-15 DIAGNOSIS — E1129 Type 2 diabetes mellitus with other diabetic kidney complication: Secondary | ICD-10-CM | POA: Diagnosis not present

## 2016-12-15 DIAGNOSIS — D631 Anemia in chronic kidney disease: Secondary | ICD-10-CM | POA: Diagnosis not present

## 2016-12-17 DIAGNOSIS — N2581 Secondary hyperparathyroidism of renal origin: Secondary | ICD-10-CM | POA: Diagnosis not present

## 2016-12-17 DIAGNOSIS — E1129 Type 2 diabetes mellitus with other diabetic kidney complication: Secondary | ICD-10-CM | POA: Diagnosis not present

## 2016-12-17 DIAGNOSIS — D631 Anemia in chronic kidney disease: Secondary | ICD-10-CM | POA: Diagnosis not present

## 2016-12-17 DIAGNOSIS — N186 End stage renal disease: Secondary | ICD-10-CM | POA: Diagnosis not present

## 2016-12-19 DIAGNOSIS — D631 Anemia in chronic kidney disease: Secondary | ICD-10-CM | POA: Diagnosis not present

## 2016-12-19 DIAGNOSIS — N186 End stage renal disease: Secondary | ICD-10-CM | POA: Diagnosis not present

## 2016-12-19 DIAGNOSIS — E1129 Type 2 diabetes mellitus with other diabetic kidney complication: Secondary | ICD-10-CM | POA: Diagnosis not present

## 2016-12-19 DIAGNOSIS — N2581 Secondary hyperparathyroidism of renal origin: Secondary | ICD-10-CM | POA: Diagnosis not present

## 2016-12-22 DIAGNOSIS — D631 Anemia in chronic kidney disease: Secondary | ICD-10-CM | POA: Diagnosis not present

## 2016-12-22 DIAGNOSIS — E1129 Type 2 diabetes mellitus with other diabetic kidney complication: Secondary | ICD-10-CM | POA: Diagnosis not present

## 2016-12-22 DIAGNOSIS — N2581 Secondary hyperparathyroidism of renal origin: Secondary | ICD-10-CM | POA: Diagnosis not present

## 2016-12-22 DIAGNOSIS — N186 End stage renal disease: Secondary | ICD-10-CM | POA: Diagnosis not present

## 2016-12-24 DIAGNOSIS — E1129 Type 2 diabetes mellitus with other diabetic kidney complication: Secondary | ICD-10-CM | POA: Diagnosis not present

## 2016-12-24 DIAGNOSIS — N2581 Secondary hyperparathyroidism of renal origin: Secondary | ICD-10-CM | POA: Diagnosis not present

## 2016-12-24 DIAGNOSIS — N186 End stage renal disease: Secondary | ICD-10-CM | POA: Diagnosis not present

## 2016-12-24 DIAGNOSIS — D631 Anemia in chronic kidney disease: Secondary | ICD-10-CM | POA: Diagnosis not present

## 2016-12-26 DIAGNOSIS — N2581 Secondary hyperparathyroidism of renal origin: Secondary | ICD-10-CM | POA: Diagnosis not present

## 2016-12-26 DIAGNOSIS — E1129 Type 2 diabetes mellitus with other diabetic kidney complication: Secondary | ICD-10-CM | POA: Diagnosis not present

## 2016-12-26 DIAGNOSIS — N186 End stage renal disease: Secondary | ICD-10-CM | POA: Diagnosis not present

## 2016-12-26 DIAGNOSIS — D631 Anemia in chronic kidney disease: Secondary | ICD-10-CM | POA: Diagnosis not present

## 2016-12-29 DIAGNOSIS — D631 Anemia in chronic kidney disease: Secondary | ICD-10-CM | POA: Diagnosis not present

## 2016-12-29 DIAGNOSIS — N2581 Secondary hyperparathyroidism of renal origin: Secondary | ICD-10-CM | POA: Diagnosis not present

## 2016-12-29 DIAGNOSIS — E1129 Type 2 diabetes mellitus with other diabetic kidney complication: Secondary | ICD-10-CM | POA: Diagnosis not present

## 2016-12-29 DIAGNOSIS — Z992 Dependence on renal dialysis: Secondary | ICD-10-CM | POA: Diagnosis not present

## 2016-12-29 DIAGNOSIS — N186 End stage renal disease: Secondary | ICD-10-CM | POA: Diagnosis not present

## 2016-12-31 DIAGNOSIS — D631 Anemia in chronic kidney disease: Secondary | ICD-10-CM | POA: Diagnosis not present

## 2016-12-31 DIAGNOSIS — N2581 Secondary hyperparathyroidism of renal origin: Secondary | ICD-10-CM | POA: Diagnosis not present

## 2016-12-31 DIAGNOSIS — E1129 Type 2 diabetes mellitus with other diabetic kidney complication: Secondary | ICD-10-CM | POA: Diagnosis not present

## 2016-12-31 DIAGNOSIS — N186 End stage renal disease: Secondary | ICD-10-CM | POA: Diagnosis not present

## 2017-01-02 DIAGNOSIS — D631 Anemia in chronic kidney disease: Secondary | ICD-10-CM | POA: Diagnosis not present

## 2017-01-02 DIAGNOSIS — E1129 Type 2 diabetes mellitus with other diabetic kidney complication: Secondary | ICD-10-CM | POA: Diagnosis not present

## 2017-01-02 DIAGNOSIS — N2581 Secondary hyperparathyroidism of renal origin: Secondary | ICD-10-CM | POA: Diagnosis not present

## 2017-01-02 DIAGNOSIS — N186 End stage renal disease: Secondary | ICD-10-CM | POA: Diagnosis not present

## 2017-01-05 DIAGNOSIS — Z992 Dependence on renal dialysis: Secondary | ICD-10-CM | POA: Diagnosis not present

## 2017-01-05 DIAGNOSIS — I871 Compression of vein: Secondary | ICD-10-CM | POA: Diagnosis not present

## 2017-01-05 DIAGNOSIS — N186 End stage renal disease: Secondary | ICD-10-CM | POA: Diagnosis not present

## 2017-01-05 DIAGNOSIS — T82868A Thrombosis of vascular prosthetic devices, implants and grafts, initial encounter: Secondary | ICD-10-CM | POA: Diagnosis not present

## 2017-01-07 DIAGNOSIS — E1129 Type 2 diabetes mellitus with other diabetic kidney complication: Secondary | ICD-10-CM | POA: Diagnosis not present

## 2017-01-07 DIAGNOSIS — D631 Anemia in chronic kidney disease: Secondary | ICD-10-CM | POA: Diagnosis not present

## 2017-01-07 DIAGNOSIS — N2581 Secondary hyperparathyroidism of renal origin: Secondary | ICD-10-CM | POA: Diagnosis not present

## 2017-01-07 DIAGNOSIS — N186 End stage renal disease: Secondary | ICD-10-CM | POA: Diagnosis not present

## 2017-01-09 DIAGNOSIS — N186 End stage renal disease: Secondary | ICD-10-CM | POA: Diagnosis not present

## 2017-01-09 DIAGNOSIS — E1129 Type 2 diabetes mellitus with other diabetic kidney complication: Secondary | ICD-10-CM | POA: Diagnosis not present

## 2017-01-09 DIAGNOSIS — D631 Anemia in chronic kidney disease: Secondary | ICD-10-CM | POA: Diagnosis not present

## 2017-01-09 DIAGNOSIS — N2581 Secondary hyperparathyroidism of renal origin: Secondary | ICD-10-CM | POA: Diagnosis not present

## 2017-01-12 DIAGNOSIS — E1129 Type 2 diabetes mellitus with other diabetic kidney complication: Secondary | ICD-10-CM | POA: Diagnosis not present

## 2017-01-12 DIAGNOSIS — N186 End stage renal disease: Secondary | ICD-10-CM | POA: Diagnosis not present

## 2017-01-12 DIAGNOSIS — N2581 Secondary hyperparathyroidism of renal origin: Secondary | ICD-10-CM | POA: Diagnosis not present

## 2017-01-12 DIAGNOSIS — D631 Anemia in chronic kidney disease: Secondary | ICD-10-CM | POA: Diagnosis not present

## 2017-01-14 DIAGNOSIS — D631 Anemia in chronic kidney disease: Secondary | ICD-10-CM | POA: Diagnosis not present

## 2017-01-14 DIAGNOSIS — N2581 Secondary hyperparathyroidism of renal origin: Secondary | ICD-10-CM | POA: Diagnosis not present

## 2017-01-14 DIAGNOSIS — E1129 Type 2 diabetes mellitus with other diabetic kidney complication: Secondary | ICD-10-CM | POA: Diagnosis not present

## 2017-01-14 DIAGNOSIS — N186 End stage renal disease: Secondary | ICD-10-CM | POA: Diagnosis not present

## 2017-01-16 DIAGNOSIS — E1129 Type 2 diabetes mellitus with other diabetic kidney complication: Secondary | ICD-10-CM | POA: Diagnosis not present

## 2017-01-16 DIAGNOSIS — D631 Anemia in chronic kidney disease: Secondary | ICD-10-CM | POA: Diagnosis not present

## 2017-01-16 DIAGNOSIS — N186 End stage renal disease: Secondary | ICD-10-CM | POA: Diagnosis not present

## 2017-01-16 DIAGNOSIS — N2581 Secondary hyperparathyroidism of renal origin: Secondary | ICD-10-CM | POA: Diagnosis not present

## 2017-01-18 ENCOUNTER — Other Ambulatory Visit: Payer: Self-pay | Admitting: Family Medicine

## 2017-01-19 DIAGNOSIS — N2581 Secondary hyperparathyroidism of renal origin: Secondary | ICD-10-CM | POA: Diagnosis not present

## 2017-01-19 DIAGNOSIS — N186 End stage renal disease: Secondary | ICD-10-CM | POA: Diagnosis not present

## 2017-01-19 DIAGNOSIS — E1129 Type 2 diabetes mellitus with other diabetic kidney complication: Secondary | ICD-10-CM | POA: Diagnosis not present

## 2017-01-19 DIAGNOSIS — D631 Anemia in chronic kidney disease: Secondary | ICD-10-CM | POA: Diagnosis not present

## 2017-01-21 DIAGNOSIS — N186 End stage renal disease: Secondary | ICD-10-CM | POA: Diagnosis not present

## 2017-01-21 DIAGNOSIS — E1129 Type 2 diabetes mellitus with other diabetic kidney complication: Secondary | ICD-10-CM | POA: Diagnosis not present

## 2017-01-21 DIAGNOSIS — D631 Anemia in chronic kidney disease: Secondary | ICD-10-CM | POA: Diagnosis not present

## 2017-01-21 DIAGNOSIS — N2581 Secondary hyperparathyroidism of renal origin: Secondary | ICD-10-CM | POA: Diagnosis not present

## 2017-01-23 DIAGNOSIS — D631 Anemia in chronic kidney disease: Secondary | ICD-10-CM | POA: Diagnosis not present

## 2017-01-23 DIAGNOSIS — E1129 Type 2 diabetes mellitus with other diabetic kidney complication: Secondary | ICD-10-CM | POA: Diagnosis not present

## 2017-01-23 DIAGNOSIS — N2581 Secondary hyperparathyroidism of renal origin: Secondary | ICD-10-CM | POA: Diagnosis not present

## 2017-01-23 DIAGNOSIS — N186 End stage renal disease: Secondary | ICD-10-CM | POA: Diagnosis not present

## 2017-01-26 DIAGNOSIS — N186 End stage renal disease: Secondary | ICD-10-CM | POA: Diagnosis not present

## 2017-01-26 DIAGNOSIS — N2581 Secondary hyperparathyroidism of renal origin: Secondary | ICD-10-CM | POA: Diagnosis not present

## 2017-01-26 DIAGNOSIS — D631 Anemia in chronic kidney disease: Secondary | ICD-10-CM | POA: Diagnosis not present

## 2017-01-26 DIAGNOSIS — E1129 Type 2 diabetes mellitus with other diabetic kidney complication: Secondary | ICD-10-CM | POA: Diagnosis not present

## 2017-01-28 DIAGNOSIS — D631 Anemia in chronic kidney disease: Secondary | ICD-10-CM | POA: Diagnosis not present

## 2017-01-28 DIAGNOSIS — N2581 Secondary hyperparathyroidism of renal origin: Secondary | ICD-10-CM | POA: Diagnosis not present

## 2017-01-28 DIAGNOSIS — N186 End stage renal disease: Secondary | ICD-10-CM | POA: Diagnosis not present

## 2017-01-28 DIAGNOSIS — E1129 Type 2 diabetes mellitus with other diabetic kidney complication: Secondary | ICD-10-CM | POA: Diagnosis not present

## 2017-01-29 DIAGNOSIS — Z992 Dependence on renal dialysis: Secondary | ICD-10-CM | POA: Diagnosis not present

## 2017-01-29 DIAGNOSIS — N186 End stage renal disease: Secondary | ICD-10-CM | POA: Diagnosis not present

## 2017-01-29 DIAGNOSIS — E1129 Type 2 diabetes mellitus with other diabetic kidney complication: Secondary | ICD-10-CM | POA: Diagnosis not present

## 2017-01-30 DIAGNOSIS — N2581 Secondary hyperparathyroidism of renal origin: Secondary | ICD-10-CM | POA: Diagnosis not present

## 2017-01-30 DIAGNOSIS — N186 End stage renal disease: Secondary | ICD-10-CM | POA: Diagnosis not present

## 2017-01-30 DIAGNOSIS — D631 Anemia in chronic kidney disease: Secondary | ICD-10-CM | POA: Diagnosis not present

## 2017-01-30 DIAGNOSIS — E1129 Type 2 diabetes mellitus with other diabetic kidney complication: Secondary | ICD-10-CM | POA: Diagnosis not present

## 2017-02-02 DIAGNOSIS — N186 End stage renal disease: Secondary | ICD-10-CM | POA: Diagnosis not present

## 2017-02-02 DIAGNOSIS — E1129 Type 2 diabetes mellitus with other diabetic kidney complication: Secondary | ICD-10-CM | POA: Diagnosis not present

## 2017-02-02 DIAGNOSIS — N2581 Secondary hyperparathyroidism of renal origin: Secondary | ICD-10-CM | POA: Diagnosis not present

## 2017-02-02 DIAGNOSIS — D631 Anemia in chronic kidney disease: Secondary | ICD-10-CM | POA: Diagnosis not present

## 2017-02-04 DIAGNOSIS — N186 End stage renal disease: Secondary | ICD-10-CM | POA: Diagnosis not present

## 2017-02-04 DIAGNOSIS — E1129 Type 2 diabetes mellitus with other diabetic kidney complication: Secondary | ICD-10-CM | POA: Diagnosis not present

## 2017-02-04 DIAGNOSIS — D631 Anemia in chronic kidney disease: Secondary | ICD-10-CM | POA: Diagnosis not present

## 2017-02-04 DIAGNOSIS — N2581 Secondary hyperparathyroidism of renal origin: Secondary | ICD-10-CM | POA: Diagnosis not present

## 2017-02-06 DIAGNOSIS — N2581 Secondary hyperparathyroidism of renal origin: Secondary | ICD-10-CM | POA: Diagnosis not present

## 2017-02-06 DIAGNOSIS — E1129 Type 2 diabetes mellitus with other diabetic kidney complication: Secondary | ICD-10-CM | POA: Diagnosis not present

## 2017-02-06 DIAGNOSIS — N186 End stage renal disease: Secondary | ICD-10-CM | POA: Diagnosis not present

## 2017-02-06 DIAGNOSIS — D631 Anemia in chronic kidney disease: Secondary | ICD-10-CM | POA: Diagnosis not present

## 2017-02-09 DIAGNOSIS — N186 End stage renal disease: Secondary | ICD-10-CM | POA: Diagnosis not present

## 2017-02-09 DIAGNOSIS — N2581 Secondary hyperparathyroidism of renal origin: Secondary | ICD-10-CM | POA: Diagnosis not present

## 2017-02-09 DIAGNOSIS — D631 Anemia in chronic kidney disease: Secondary | ICD-10-CM | POA: Diagnosis not present

## 2017-02-09 DIAGNOSIS — E1129 Type 2 diabetes mellitus with other diabetic kidney complication: Secondary | ICD-10-CM | POA: Diagnosis not present

## 2017-02-11 DIAGNOSIS — N186 End stage renal disease: Secondary | ICD-10-CM | POA: Diagnosis not present

## 2017-02-11 DIAGNOSIS — D631 Anemia in chronic kidney disease: Secondary | ICD-10-CM | POA: Diagnosis not present

## 2017-02-11 DIAGNOSIS — E1129 Type 2 diabetes mellitus with other diabetic kidney complication: Secondary | ICD-10-CM | POA: Diagnosis not present

## 2017-02-11 DIAGNOSIS — N2581 Secondary hyperparathyroidism of renal origin: Secondary | ICD-10-CM | POA: Diagnosis not present

## 2017-02-13 DIAGNOSIS — E1129 Type 2 diabetes mellitus with other diabetic kidney complication: Secondary | ICD-10-CM | POA: Diagnosis not present

## 2017-02-13 DIAGNOSIS — N186 End stage renal disease: Secondary | ICD-10-CM | POA: Diagnosis not present

## 2017-02-13 DIAGNOSIS — D631 Anemia in chronic kidney disease: Secondary | ICD-10-CM | POA: Diagnosis not present

## 2017-02-13 DIAGNOSIS — N2581 Secondary hyperparathyroidism of renal origin: Secondary | ICD-10-CM | POA: Diagnosis not present

## 2017-02-16 DIAGNOSIS — N186 End stage renal disease: Secondary | ICD-10-CM | POA: Diagnosis not present

## 2017-02-16 DIAGNOSIS — E1129 Type 2 diabetes mellitus with other diabetic kidney complication: Secondary | ICD-10-CM | POA: Diagnosis not present

## 2017-02-16 DIAGNOSIS — D631 Anemia in chronic kidney disease: Secondary | ICD-10-CM | POA: Diagnosis not present

## 2017-02-16 DIAGNOSIS — N2581 Secondary hyperparathyroidism of renal origin: Secondary | ICD-10-CM | POA: Diagnosis not present

## 2017-02-17 DIAGNOSIS — H353133 Nonexudative age-related macular degeneration, bilateral, advanced atrophic without subfoveal involvement: Secondary | ICD-10-CM | POA: Diagnosis not present

## 2017-02-17 DIAGNOSIS — E113553 Type 2 diabetes mellitus with stable proliferative diabetic retinopathy, bilateral: Secondary | ICD-10-CM | POA: Diagnosis not present

## 2017-02-17 DIAGNOSIS — H31009 Unspecified chorioretinal scars, unspecified eye: Secondary | ICD-10-CM | POA: Diagnosis not present

## 2017-02-17 DIAGNOSIS — H401131 Primary open-angle glaucoma, bilateral, mild stage: Secondary | ICD-10-CM | POA: Diagnosis not present

## 2017-02-18 DIAGNOSIS — N186 End stage renal disease: Secondary | ICD-10-CM | POA: Diagnosis not present

## 2017-02-18 DIAGNOSIS — N2581 Secondary hyperparathyroidism of renal origin: Secondary | ICD-10-CM | POA: Diagnosis not present

## 2017-02-18 DIAGNOSIS — E1129 Type 2 diabetes mellitus with other diabetic kidney complication: Secondary | ICD-10-CM | POA: Diagnosis not present

## 2017-02-18 DIAGNOSIS — D631 Anemia in chronic kidney disease: Secondary | ICD-10-CM | POA: Diagnosis not present

## 2017-02-20 DIAGNOSIS — E1129 Type 2 diabetes mellitus with other diabetic kidney complication: Secondary | ICD-10-CM | POA: Diagnosis not present

## 2017-02-20 DIAGNOSIS — N2581 Secondary hyperparathyroidism of renal origin: Secondary | ICD-10-CM | POA: Diagnosis not present

## 2017-02-20 DIAGNOSIS — D631 Anemia in chronic kidney disease: Secondary | ICD-10-CM | POA: Diagnosis not present

## 2017-02-20 DIAGNOSIS — N186 End stage renal disease: Secondary | ICD-10-CM | POA: Diagnosis not present

## 2017-02-23 DIAGNOSIS — E1129 Type 2 diabetes mellitus with other diabetic kidney complication: Secondary | ICD-10-CM | POA: Diagnosis not present

## 2017-02-23 DIAGNOSIS — N186 End stage renal disease: Secondary | ICD-10-CM | POA: Diagnosis not present

## 2017-02-23 DIAGNOSIS — D631 Anemia in chronic kidney disease: Secondary | ICD-10-CM | POA: Diagnosis not present

## 2017-02-23 DIAGNOSIS — N2581 Secondary hyperparathyroidism of renal origin: Secondary | ICD-10-CM | POA: Diagnosis not present

## 2017-02-25 DIAGNOSIS — N186 End stage renal disease: Secondary | ICD-10-CM | POA: Diagnosis not present

## 2017-02-25 DIAGNOSIS — E1129 Type 2 diabetes mellitus with other diabetic kidney complication: Secondary | ICD-10-CM | POA: Diagnosis not present

## 2017-02-25 DIAGNOSIS — D631 Anemia in chronic kidney disease: Secondary | ICD-10-CM | POA: Diagnosis not present

## 2017-02-25 DIAGNOSIS — N2581 Secondary hyperparathyroidism of renal origin: Secondary | ICD-10-CM | POA: Diagnosis not present

## 2017-02-27 DIAGNOSIS — N2581 Secondary hyperparathyroidism of renal origin: Secondary | ICD-10-CM | POA: Diagnosis not present

## 2017-02-27 DIAGNOSIS — D631 Anemia in chronic kidney disease: Secondary | ICD-10-CM | POA: Diagnosis not present

## 2017-02-27 DIAGNOSIS — E1129 Type 2 diabetes mellitus with other diabetic kidney complication: Secondary | ICD-10-CM | POA: Diagnosis not present

## 2017-02-27 DIAGNOSIS — N186 End stage renal disease: Secondary | ICD-10-CM | POA: Diagnosis not present

## 2017-03-01 ENCOUNTER — Other Ambulatory Visit: Payer: Self-pay

## 2017-03-01 NOTE — Telephone Encounter (Signed)
[name] request refill Namenda and she wants to verify that pt takes 1 tab twice a day and on days for dialysis gives pt 2 tabs in AM and 1 tab in PM and [name] wants to verify that is correct. [facility] [address] [name] request cb.

## 2017-03-01 NOTE — Telephone Encounter (Signed)
PLEASE NOTE: All timestamps contained within this report are represented as [place] Standard Time. CONFIDENTIALTY NOTICE: This fax transmission is intended only for the addressee. It contains information that is legally privileged, confidential or otherwise protected from use or disclosure. If you are not the intended recipient, you are strictly prohibited from reviewing, disclosing, copying using or disseminating any of this information or taking any action in reliance on or regarding this information. If you have received this fax in error, please notify us immediately by telephone so that we can arrange for its return to [place]. Phone: [phone], Toll-Free: [phone], Fax: [phone] Page: 1 of 3 Call Id: [id number] [facility] Night - Client >>>Contains Verbal Order - Signature Required<<< TELEPHONE ADVICE RECORD [facility] Patient Name: MARGUERITTE GUTHRIDGE Gender: Female DOB: Jan 12, 1929 Age: 83 Y 7 M 14 D Return Phone Number: [phone] (Secondary) [place] [address] Client [facility] Night - Client Client Site [facility] - Night Physician Eustaquio Boyden - MD Contact Type Call Who Is Calling Patient / Member / Family / Caregiver Call Type Triage / Clinical Caller Name Dorice Lamas Relationship To Patient Daughter Return Phone Number [phone] (Secondary) Chief Complaint Prescription Refill or Medication Request (non symptomatic) Reason for Call Medication Question / Request Initial Comment Caller states that her mother needs a prescription refill Translation No Nurse Assessment Nurse: Raylene Miyamoto, RN, Santina Evans Date/Time (Eastern Time): 02/26/2017 5:53:38 PM Please select the assessment type ---Refill Additional Documentation ---Caller states that pt is needing a refill on her namenda . She is completely out of the medication. She takes it twice a day. Does the  patient have enough medication to last until the office opens? ---Unable to obtain loaner dose from Pharmacy Does the client directives allow for assistance with medications after hours? ---Yes Was the medication filled within the last 6 months? ---Yes What is the name of the medication, dose and instructions as listed on the bottle? ---Namenda  BID Name of the physician as listed on the bottle. ---Eustaquio Boyden Pharmacy name and phone number where most recently filled. ---[phone] [facility] Guidelines Guideline Title Affirmed Question Affirmed Notes Nurse Date/Time Lamount Cohen Time) Disp. Time Lamount Cohen Time) Disposition Final User 02/26/2017 6:05:46 PM Pharmacy Call Raylene Miyamoto, RN, Santina Evans Reason: Confirmed with pharmacy that medication was last filled with them. Gave permission to refill enough for this weekend until office is open again on Monday. PLEASE NOTE: All timestamps contained within this report are represented as [place] Standard Time. CONFIDENTIALTY NOTICE: This fax transmission is intended only for the addressee. It contains information that is legally privileged, confidential or otherwise protected from use or disclosure. If you are not the intended recipient, you are strictly prohibited from reviewing, disclosing, copying using or disseminating any of this information or taking any action in reliance on or regarding this information. If you have received this fax in error, please notify us immediately by telephone so that we can arrange for its return to [place]. Phone: [phone], Toll-Free: [phone], Fax: [phone] Page: 2 of 3 Call Id: [id number] 02/26/2017 6:06:40 PM Clinical Call Yes Raylene Miyamoto, RN, Santina Evans Verbal Orders/Maintenance Medications Medication Refill Route Dosage Regime Duration Admin Instructions User Name Namenda  Yes Oral BID 3 Days Raylene Miyamoto, RN, Santina Evans PLEASE NOTE: All timestamps contained within this report are represented as  [place] Standard Time. CONFIDENTIALTY NOTICE: This fax transmission is intended only for the addressee. It contains information that is legally privileged, confidential or otherwise protected  from use or disclosure. If you are not the intended recipient, you are strictly prohibited from reviewing, disclosing, copying using or disseminating any of this information or taking any action in reliance on or regarding this information. If you have received this fax in error, please notify us immediately by telephone so that we can arrange for its return to [place]. Phone: [phone], Toll-Free: [phone], Fax: [phone] Page: 3 of 3 Call Id: [id number] [facility] >>>Contains Verbal Order - Signature Required<<< [address] [place], [place] [address] [phone] Fax: [phone] MEDICATION ORDER [facility] Night - Client [facility] - Night Date: 02/26/2017 From: QI Department To: Eustaquio Boyden - MD Please sign the order for the approved drug(s) given by our call center nurse on your behalf. Fax to [phone] within 5 business days. Thank you. Date Lamount Cohen Time): 02/26/2017 5:31:19 PM Triage RN: Cordelia Pen, RN NAME: [name] PHONE NUMBER: [phone] (Secondary) BIRTHDATE: 1928-08-11 ADDRESS: CITY/STATE/ZIP: [place] [address] CALLER: Daughter NAME: Dorice Lamas Rx Given Medication Refill Route Dosage Regime Duration Admin Instructions User Name Namenda  Yes Oral BID 3 Days Raylene Miyamoto, RN, Santina Evans MD Signature Date

## 2017-03-01 NOTE — Telephone Encounter (Signed)
Unable to reach [facility] signed) or pt; no answer and no v/m set up; will wait for refill request; not sure what pharmacy to send refill to. Last CPX 07/27/16.

## 2017-03-02 DIAGNOSIS — Z23 Encounter for immunization: Secondary | ICD-10-CM | POA: Diagnosis not present

## 2017-03-02 DIAGNOSIS — N186 End stage renal disease: Secondary | ICD-10-CM | POA: Diagnosis not present

## 2017-03-02 DIAGNOSIS — E1129 Type 2 diabetes mellitus with other diabetic kidney complication: Secondary | ICD-10-CM | POA: Diagnosis not present

## 2017-03-02 DIAGNOSIS — Z992 Dependence on renal dialysis: Secondary | ICD-10-CM | POA: Diagnosis not present

## 2017-03-02 DIAGNOSIS — N2581 Secondary hyperparathyroidism of renal origin: Secondary | ICD-10-CM | POA: Diagnosis not present

## 2017-03-02 DIAGNOSIS — D631 Anemia in chronic kidney disease: Secondary | ICD-10-CM | POA: Diagnosis not present

## 2017-03-02 MED ORDER — MEMANTINE HCL 5 MG PO TABS: ORAL_TABLET | ORAL | 1 refills | Status: DC

## 2017-03-02 NOTE — Telephone Encounter (Signed)
PLEASE NOTE: All timestamps contained within this report are represented as [place] Standard Time. CONFIDENTIALTY NOTICE: This fax transmission is intended only for the addressee. It contains information that is legally privileged, confidential or otherwise protected from use or disclosure. If you are not the intended recipient, you are strictly prohibited from reviewing, disclosing, copying using or disseminating any of this information or taking any action in reliance on or regarding this information. If you have received this fax in error, please notify us immediately by telephone so that we can arrange for its return to [place]. Phone: [phone], Toll-Free: [phone], Fax: [phone] Page: 1 of 1 Call Id: [id number] [facility] Night - Client TELEPHONE ADVICE RECORD [facility] Call Center Patient Name: [name] Gender: Female DOB: 1928/08/02 Age: 60 Y 7 M 18 D Return Phone Number: [phone] (Secondary) Address: City/State/Zip: [place] [address] Client [facility] Night - Client Client Site [facility] - Night Physician Eustaquio Boyden - MD Contact Type Call Who Is Calling Patient / Member / Family / Caregiver Call Type Triage / Clinical Caller Name Mickle Asper Relationship To Patient Daughter Return Phone Number [phone] (Secondary) Chief Complaint Prescription Refill or Medication Request (non symptomatic) Reason for Call Symptomatic / Request for Health Information Initial Comment Caller states her mother medication is out and needs a refill. Translation No Nurse Assessment Nurse: Cox, RN, [employer] Date/Time (Eastern Time): 03/01/2017 8:45:01 PM Confirm and document reason for call. If symptomatic, describe symptoms. ---Caller states mother is out of medication, took last dose tonight. Has dialysis tomorrow. Talked to triage nurse earlier. Got an emergency refill. Needs memantine  5 mg refilled. [facility] [address]. No symptoms. Does the patient have any new or worsening symptoms? ---No Guidelines Guideline Title Affirmed Question Affirmed Notes Nurse Date/Time (Eastern Time) Disp. Time Lamount Cohen Time) Disposition Final User 03/01/2017 8:29:55 PM Attempt made - message left Cox, RN, [employer]icon 03/01/2017 8:48:51 PM Clinical Call Yes Cox, RN, [employer] Comments User: Fayette Pho, RN Date/Time (Eastern Time): 03/01/2017 8:29:44 PM unable to leave message, voice mail box not set up

## 2017-03-02 NOTE — Telephone Encounter (Signed)
Refilled

## 2017-03-03 NOTE — Telephone Encounter (Signed)
Noted  

## 2017-03-04 DIAGNOSIS — N186 End stage renal disease: Secondary | ICD-10-CM | POA: Diagnosis not present

## 2017-03-04 DIAGNOSIS — E1129 Type 2 diabetes mellitus with other diabetic kidney complication: Secondary | ICD-10-CM | POA: Diagnosis not present

## 2017-03-04 DIAGNOSIS — Z992 Dependence on renal dialysis: Secondary | ICD-10-CM | POA: Diagnosis not present

## 2017-03-04 DIAGNOSIS — D631 Anemia in chronic kidney disease: Secondary | ICD-10-CM | POA: Diagnosis not present

## 2017-03-04 DIAGNOSIS — N2581 Secondary hyperparathyroidism of renal origin: Secondary | ICD-10-CM | POA: Diagnosis not present

## 2017-03-06 DIAGNOSIS — D631 Anemia in chronic kidney disease: Secondary | ICD-10-CM | POA: Diagnosis not present

## 2017-03-06 DIAGNOSIS — Z992 Dependence on renal dialysis: Secondary | ICD-10-CM | POA: Diagnosis not present

## 2017-03-06 DIAGNOSIS — E1129 Type 2 diabetes mellitus with other diabetic kidney complication: Secondary | ICD-10-CM | POA: Diagnosis not present

## 2017-03-06 DIAGNOSIS — N2581 Secondary hyperparathyroidism of renal origin: Secondary | ICD-10-CM | POA: Diagnosis not present

## 2017-03-06 DIAGNOSIS — N186 End stage renal disease: Secondary | ICD-10-CM | POA: Diagnosis not present

## 2017-03-09 DIAGNOSIS — N2581 Secondary hyperparathyroidism of renal origin: Secondary | ICD-10-CM | POA: Diagnosis not present

## 2017-03-09 DIAGNOSIS — D631 Anemia in chronic kidney disease: Secondary | ICD-10-CM | POA: Diagnosis not present

## 2017-03-09 DIAGNOSIS — E1129 Type 2 diabetes mellitus with other diabetic kidney complication: Secondary | ICD-10-CM | POA: Diagnosis not present

## 2017-03-09 DIAGNOSIS — N186 End stage renal disease: Secondary | ICD-10-CM | POA: Diagnosis not present

## 2017-03-09 DIAGNOSIS — Z992 Dependence on renal dialysis: Secondary | ICD-10-CM | POA: Diagnosis not present

## 2017-03-11 DIAGNOSIS — N186 End stage renal disease: Secondary | ICD-10-CM | POA: Diagnosis not present

## 2017-03-11 DIAGNOSIS — D631 Anemia in chronic kidney disease: Secondary | ICD-10-CM | POA: Diagnosis not present

## 2017-03-11 DIAGNOSIS — N2581 Secondary hyperparathyroidism of renal origin: Secondary | ICD-10-CM | POA: Diagnosis not present

## 2017-03-11 DIAGNOSIS — Z992 Dependence on renal dialysis: Secondary | ICD-10-CM | POA: Diagnosis not present

## 2017-03-11 DIAGNOSIS — E1129 Type 2 diabetes mellitus with other diabetic kidney complication: Secondary | ICD-10-CM | POA: Diagnosis not present

## 2017-03-16 DIAGNOSIS — E1129 Type 2 diabetes mellitus with other diabetic kidney complication: Secondary | ICD-10-CM | POA: Diagnosis not present

## 2017-03-16 DIAGNOSIS — Z992 Dependence on renal dialysis: Secondary | ICD-10-CM | POA: Diagnosis not present

## 2017-03-16 DIAGNOSIS — D631 Anemia in chronic kidney disease: Secondary | ICD-10-CM | POA: Diagnosis not present

## 2017-03-16 DIAGNOSIS — N186 End stage renal disease: Secondary | ICD-10-CM | POA: Diagnosis not present

## 2017-03-16 DIAGNOSIS — N2581 Secondary hyperparathyroidism of renal origin: Secondary | ICD-10-CM | POA: Diagnosis not present

## 2017-03-18 ENCOUNTER — Telehealth: Payer: Self-pay

## 2017-03-18 DIAGNOSIS — D631 Anemia in chronic kidney disease: Secondary | ICD-10-CM | POA: Diagnosis not present

## 2017-03-18 DIAGNOSIS — Z992 Dependence on renal dialysis: Secondary | ICD-10-CM | POA: Diagnosis not present

## 2017-03-18 DIAGNOSIS — E1129 Type 2 diabetes mellitus with other diabetic kidney complication: Secondary | ICD-10-CM | POA: Diagnosis not present

## 2017-03-18 DIAGNOSIS — N186 End stage renal disease: Secondary | ICD-10-CM | POA: Diagnosis not present

## 2017-03-18 DIAGNOSIS — N2581 Secondary hyperparathyroidism of renal origin: Secondary | ICD-10-CM | POA: Diagnosis not present

## 2017-03-18 NOTE — Telephone Encounter (Signed)
plz schedule OV for tomorrow. Thanks.

## 2017-03-18 NOTE — Telephone Encounter (Signed)
Unable to reach pts daughter Dedra SkeensGwen; no answer and v/m not set up.

## 2017-03-18 NOTE — Telephone Encounter (Signed)
PLEASE NOTE: All timestamps contained within this report are represented as [place] Standard Time. CONFIDENTIALTY NOTICE: This fax transmission is intended only for the addressee. It contains information that is legally privileged, confidential or otherwise protected from use or disclosure. If you are not the intended recipient, you are strictly prohibited from reviewing, disclosing, copying using or disseminating any of this information or taking any action in reliance on or regarding this information. If you have received this fax in error, please notify us immediately by telephone so that we can arrange for its return to us. Phone: [phone], Toll-Free: [phone], Fax: [phone] Page: 1 of 2 Call Id: [id number] [facility] Night - Client TELEPHONE ADVICE RECORD [facility] Patient Name: Michelle HatchetJOSIE Angello Gender: Female DOB: 03/07/1929 Age: [age] Y 8 M 4 D Return Phone Number: [phone] (Primary) Address: City[place]State/Zip: [place] [id number] Client [facility] Night - Client Client Site [facility] - Night Physician Eustaquio BoydenGutierrez, Javier - MD Contact Type Call Who Is Calling Patient / Member / Family / Caregiver Call Type Triage / Clinical Caller Name Gwendolyn Relationship To Patient Daughter Return Phone Number [phone](336) [phone] (Primary) Chief Complaint Diarrhea Reason for Call Symptomatic / Request for Health Information Initial Comment Callers mom has diarrhea and not feeling good she may also have a UTI as well Translation No Nurse Assessment Nurse: Lelon PerlaSaunders, RN, Misty StanleyLisa Date/Time (Eastern Time): 03/17/2017 8:13:43 PM Confirm and document reason for call. If symptomatic, describe symptoms. ---caller states her mother has been having loose bowel movements and has been giving her Immodium, she does not urinate due to renal failure. complains of burning and pain on skin. goes to dialysis at 6:30, no  fever, appetite on and off, has dementia, diabetes, htn . no vomiting, Diarrhea for a week Does the patient have any new or worsening symptoms? ---Yes Will a triage be completed? ---Yes Related visit to physician within the last 2 weeks? ---No Does the PT have any chronic conditions? (i.e. diabetes, asthma, etc.) ---Yes List chronic conditions. ---esrd, on dialysis, dm , dementia, htn Is this a behavioral health or substance abuse call? ---No Guidelines Guideline Title Affirmed Question Affirmed Notes Nurse Date/Time (Eastern Time) Diarrhea Weak immune system (e.g., HIV positive, cancer chemo, splenectomy, organ transplant, chronic steroids) Lelon PerlaSaunders, RN, Misty StanleyLisa 03/17/2017 8:18:17 PM Disp. Time Lamount Cohen(Eastern Time) Disposition Final User 03/17/2017 8:24:12 PM See Physician within 24 Hours Yes Lelon PerlaSaunders, RN, Misty StanleyLisa PLEASE NOTE: All timestamps contained within this report are represented as [place] Standard Time. CONFIDENTIALTY NOTICE: This fax transmission is intended only for the addressee. It contains information that is legally privileged, confidential or otherwise protected from use or disclosure. If you are not the intended recipient, you are strictly prohibited from reviewing, disclosing, copying using or disseminating any of this information or taking any action in reliance on or regarding this information. If you have received this fax in error, please notify us immediately by telephone so that we can arrange for its return to us. Phone: [phone], Toll-Free: [phone], Fax: [phone] Page: 2 of 2 Call Id: [id number] Caller Disagree/Comply Comply Caller Understands Yes PreDisposition Go to ED Care Advice Given Per Guideline SEE PHYSICIAN WITHIN 24 HOURS: * IF OFFICE WILL BE OPEN: You need to be seen within the next 24 hours. Call your doctor when the office opens, and make an appointment. * IF OFFICE WILL BE CLOSED AND NO PCP TRIAGE: You need to be seen within the next 24 hours.  An urgent  care center is often a good source of care if your doctor's office is closed. * Drink more fluids, at least 8-10 cups daily. One cup equals 8 oz (240 ml). FLUID THERAPY DURING MILD-MODERATE DIARRHEA: * WATER: For mild to moderate diarrhea, water is often the best liquid to drink. You should also eat some salty foods (e.g., potato chips, pretzels, saltine crackers). This is important to make sure you are getting enough salt, sugars, and fluids to meet your body's needs. * SPORTS DRINKS: You can also drink a sports drinks (e.g., Gatorade, Powerade) to help treat and prevent dehydration. For it to work best, mix it half and half with water. * Begin with boiled starches / cereals (e.g., potatoes, rice, noodles, wheat, oats) with a small amount of salt to taste. FOOD AND NUTRITION DURING MILD-MODERATE DIARRHEA CALL BACK IF: * Signs of dehydration occur (e.g., no urine over 12 hours, very dry mouth, lightheaded, etc.) * Bloody stools * Constant or severe abdominal pain * You become worse. CARE ADVICE given per Diarrhea (Adult) guideline. Comments User: Ileene Hutchinson, RN Date/Time Lamount Cohen Time): 03/17/2017 8:26:44 PM caller states the patient has dementia, so difficult to assess, has esrd on dialysis, pt wit diarrhea 1 time today, however been going on for over a week. Caller also states that she feels pt has a UTI, but does not urinate due to her renal status, no fever, caller verbalized and understanding to see PCP within 24 hours Referrals REFERRED TO PCP OFFICE

## 2017-03-18 NOTE — Telephone Encounter (Signed)
[name] scheduled 30' appt 03/19/17 at 11:30 with Harlin Heys Gessner FNP.

## 2017-03-18 NOTE — Telephone Encounter (Signed)
Noted  

## 2017-03-19 ENCOUNTER — Ambulatory Visit: Payer: Medicare Other | Admitting: Family Medicine

## 2017-03-19 DIAGNOSIS — Z0289 Encounter for other administrative examinations: Secondary | ICD-10-CM

## 2017-03-19 NOTE — Telephone Encounter (Signed)
Pt came in 30 min late [name] could not see her per Michelle Lemmingsawn S.  I offered caregiver another location she stated she didn't have time to drive around.  offered sat clinic.  told caregiver they open schedule after 1 to call back here so we could put pt on schedule for sat clinic

## 2017-03-19 NOTE — Telephone Encounter (Signed)
Noted. Fwd to Dr. G. 

## 2017-03-20 ENCOUNTER — Encounter (HOSPITAL_COMMUNITY): Payer: Self-pay | Admitting: *Deleted

## 2017-03-20 ENCOUNTER — Emergency Department (HOSPITAL_COMMUNITY)
Admission: EM | Admit: 2017-03-20 | Discharge: 2017-03-20 | Disposition: A | Discharge status: Home / Self Care | Payer: Medicare Other | Attending: Emergency Medicine | Admitting: Emergency Medicine

## 2017-03-20 DIAGNOSIS — Z992 Dependence on renal dialysis: Secondary | ICD-10-CM | POA: Insufficient documentation

## 2017-03-20 DIAGNOSIS — Z7982 Long term (current) use of aspirin: Secondary | ICD-10-CM | POA: Diagnosis not present

## 2017-03-20 DIAGNOSIS — E1122 Type 2 diabetes mellitus with diabetic chronic kidney disease: Secondary | ICD-10-CM | POA: Diagnosis not present

## 2017-03-20 DIAGNOSIS — R197 Diarrhea, unspecified: Secondary | ICD-10-CM | POA: Insufficient documentation

## 2017-03-20 DIAGNOSIS — D631 Anemia in chronic kidney disease: Secondary | ICD-10-CM | POA: Diagnosis not present

## 2017-03-20 DIAGNOSIS — N186 End stage renal disease: Secondary | ICD-10-CM | POA: Insufficient documentation

## 2017-03-20 DIAGNOSIS — I120 Hypertensive chronic kidney disease with stage 5 chronic kidney disease or end stage renal disease: Secondary | ICD-10-CM | POA: Insufficient documentation

## 2017-03-20 DIAGNOSIS — Z79899 Other long term (current) drug therapy: Secondary | ICD-10-CM | POA: Insufficient documentation

## 2017-03-20 DIAGNOSIS — F0390 Unspecified dementia without behavioral disturbance: Secondary | ICD-10-CM | POA: Diagnosis not present

## 2017-03-20 DIAGNOSIS — E1129 Type 2 diabetes mellitus with other diabetic kidney complication: Secondary | ICD-10-CM | POA: Diagnosis not present

## 2017-03-20 DIAGNOSIS — N2581 Secondary hyperparathyroidism of renal origin: Secondary | ICD-10-CM | POA: Diagnosis not present

## 2017-03-20 LAB — CBC WITH DIFFERENTIAL/PLATELET
BASOS ABS: 0 10*3/uL (ref 0.0–0.1)
BASOS PCT: 1 %
EOS ABS: 0.2 10*3/uL (ref 0.0–0.7)
Eosinophils Relative: 5 %
HCT: 31.6 % — ABNORMAL LOW (ref 36.0–46.0)
HEMOGLOBIN: 10.2 g/dL — AB (ref 12.0–15.0)
LYMPHS ABS: 1.3 10*3/uL (ref 0.7–4.0)
Lymphocytes Relative: 34 %
MCH: 29.7 pg (ref 26.0–34.0)
MCHC: 32.3 g/dL (ref 30.0–36.0)
MCV: 92.1 fL (ref 78.0–100.0)
Monocytes Absolute: 0.5 10*3/uL (ref 0.1–1.0)
Monocytes Relative: 12 %
NEUTROS PCT: 48 %
Neutro Abs: 1.8 10*3/uL (ref 1.7–7.7)
Platelets: 90 10*3/uL — ABNORMAL LOW (ref 150–400)
RBC: 3.43 MIL/uL — AB (ref 3.87–5.11)
RDW: 19 % — ABNORMAL HIGH (ref 11.5–15.5)
WBC: 3.8 10*3/uL — AB (ref 4.0–10.5)

## 2017-03-20 LAB — COMPREHENSIVE METABOLIC PANEL
ALBUMIN: 3.4 g/dL — AB (ref 3.5–5.0)
ALK PHOS: 68 U/L (ref 38–126)
ALT: 11 U/L — ABNORMAL LOW (ref 14–54)
AST: 21 U/L (ref 15–41)
Anion gap: 11 (ref 5–15)
BUN: 14 mg/dL (ref 6–20)
CHLORIDE: 95 mmol/L — AB (ref 101–111)
CO2: 31 mmol/L (ref 22–32)
CREATININE: 3.84 mg/dL — AB (ref 0.44–1.00)
Calcium: 8.7 mg/dL — ABNORMAL LOW (ref 8.9–10.3)
GFR calc Af Amer: 11 mL/min — ABNORMAL LOW (ref >60)
GFR calc non Af Amer: 10 mL/min — ABNORMAL LOW (ref >60)
Glucose, Bld: 125 mg/dL — ABNORMAL HIGH (ref 65–99)
Potassium: 3.5 mmol/L (ref 3.5–5.1)
Sodium: 137 mmol/L (ref 135–145)
TOTAL PROTEIN: 6.7 g/dL (ref 6.5–8.1)
Total Bilirubin: 0.6 mg/dL (ref 0.3–1.2)

## 2017-03-20 NOTE — ED Provider Notes (Signed)
[facility] EMERGENCY DEPARTMENT Provider Note  CSN: [id number] Arrival date & time: 03/20/17 1647  Chief Complaint(s) Diarrhea  HPI [name] is a 83 y.o. female with a history of ESRD on dialysis, prior history of C. difficile who presents to the emergency department with 2 weeks of diarrhea.  Daughter reports that the patient's has not had any antibiotic regimen in 3 months.  The bowel movements are watery, loose.  They have been given the patient Imodium which seems to improve the diarrhea however when they stop the Imodium the diarrhea returns.  They deny any recent travels, suspicious food intake, known sick contacts. Patient has been having some intermittent abdominal discomfort but is currently asymptomatic.  They deny any fevers, chills, nausea, vomiting.  No other associated symptoms.  No other alleviating or aggravating factors.  Remainder of history, ROS, and physical exam limited due to patient's condition (dementia). Additional information was obtained from daughter.   Level V Caveat.    HPI  Past Medical History Past Medical History:  Diagnosis Date  . Anxiety   . Bronchitis   . C. difficile diarrhea 02/01/2015  . Cough   . Depression    lexapro  . Diabetes mellitus    type 2  . Dyslipidemia    remote hx/notes 10/06/2009  . ESRD (end stage renal disease) on dialysis [facility](HCC)    Dr Coladonato/Powell  . GERD (gastroesophageal reflux disease)   . Hypertension   . 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 Osteoarthritis   . Staphylococcus aureus bacteremia with sepsis (HCC)    thought from HD catheter  . Thrombocytopenia (HCC) 11/16/2014  . Traumatic hematoma of left forearm 05/25/2015  . UTI (urinary tract infection) 05/2015   Patient Active Problem List   Diagnosis Date Noted  . Transient alteration of awareness 12/31/2015  . Medicare annual wellness visit, subsequent 07/24/2015  . Advanced care planning/counseling discussion  07/24/2015  . Hearing loss 07/24/2015  . Recurrent UTI 04/09/2015  . C. difficile diarrhea 02/01/2015  . Rash and nonspecific skin eruption 01/18/2015  . Depression   . Pressure ulcer 11/20/2014  . Thrombocytopenia (HCC) 11/16/2014  . ANEMIA, IRON DEFICIENCY, CHRONIC 07/01/2010  . UNSPECIFIED VISUAL LOSS 04/16/2010  . HYPOTENSION, ORTHOSTATIC 11/13/2009  . Dementia 03/08/2009  . HLD (hyperlipidemia) 01/11/2009  . ESRD on hemodialysis (HCC) 06/08/2007  . Osteoarthrosis, unspecified whether generalized or localized, unspecified site 02/25/2007  . Controlled type 2 diabetes mellitus with diabetic nephropathy, without long-term current use of insulin (HCC) 01/12/2007  . Essential hypertension 01/12/2007  . GERD 01/12/2007   Home Medication(s) Prior to Admission medications   Medication Sig Start Date End Date Taking? Authorizing Provider  acetaminophen (TYLENOL) 500 MG tablet Take 500 mg by mouth 2 (two) times daily as needed for mild pain.    Yes [provider]  amLODipine (NORVASC) 5 MG tablet TAKE ONE-HALF TABLET BY MOUTH ON SUNDAY, MONDAY, WEDNESDAY, AND FRIDAY. TAKE ONE TABLET BY MOUTH ALL OTHER DAYS OF THE WEEK Patient taking differently: TAKE 5 mg TABLET BY MOUTH ON SUNDAY, MONDAY, WEDNESDAY, AND FRIDAY. 01/18/17  Yes Eustaquio BoydenGutierrez, Javier, MD  aspirin 81 MG tablet Take 81 mg by mouth daily.   Yes [provider]  carvedilol (COREG) 3.125 MG tablet TAKE ONE TABLET BY MOUTH TWICE DAILY WITH MEALS Patient taking differently: TAKE 3.125 mg BY MOUTH TWICE DAILY WITH MEALS 10/21/16  Yes Eustaquio BoydenGutierrez, Javier, MD  cinacalcet (SENSIPAR) 30 MG tablet Take 30 mg by mouth daily.  Yes [provider]  Dorzolamide HCl-Timolol Mal (COSOPT OP) Place 1 drop into both eyes 2 (two) times daily.    Yes [provider]  escitalopram (LEXAPRO) 10 MG tablet TAKE ONE-HALF TABLET BY MOUTH ONCE DAILY Patient taking differently: TAKE 5 mg TABLET BY MOUTH ONCE DAILY 09/04/16  Yes  Eustaquio Boyden, MD  famotidine (PEPCID) 20 MG tablet Take 1 tablet (20 mg total) by mouth daily. 07/27/16  Yes Eustaquio Boyden, MD  folic acid-vitamin b complex-vitamin c-selenium-zinc (DIALYVITE) 3 MG TABS Take 1 tablet by mouth daily.     Yes [provider]  memantine (NAMENDA) 5 MG tablet Take one tablet twice daily. Only take 2 tablets (10mg ) in evening on dialysis days Patient taking differently: Take 5 mg by mouth See admin instructions. Take 5 mg  in the morning  And 5 mg in the evening on all the other days. Only take 2 tablets (10mg ) tues.  thurs. and sat. daily 03/02/17  Yes Eustaquio Boyden, MD  multivitamin (RENA-VIT) TABS tablet TAKE ONE TABLET BY MOUTH AT BEDTIME 05/14/15  Yes Eustaquio Boyden, MD  ondansetron (ZOFRAN ODT) 4 MG disintegrating tablet Take 1 tablet (4 mg total) by mouth every 8 (eight) hours as needed for nausea. 09/26/16  Yes Rolland Porter, MD  sevelamer carbonate (RENVELA) 0.8 g PACK packet Take 0.8 g by mouth 3 (three) times daily with meals.   Yes [provider]  ciprofloxacin (CIPRO) 250 MG tablet Take 1 tablet (250 mg total) by mouth 2 (two) times daily. Patient not taking: Reported on 03/20/2017 11/23/16   Lorre Munroe, NP  sevelamer carbonate (RENVELA) 800 MG tablet Take 2 tablets (1,600 mg total) by mouth 3 (three) times daily with meals. Patient not taking: Reported on 03/20/2017 10/24/15   Eustaquio Boyden, MD                                                                                                                                    Past Surgical History Past Surgical History:  Procedure Laterality Date  . ABDOMINAL HYSTERECTOMY     remote hx/notes 10/06/2009  . ARTERIOVENOUS GRAFT PLACEMENT Left 07/2004   forearm/notes 10/13/2010  . ARTERIOVENOUS GRAFT PLACEMENT Left 03/2006   upper armnotes 10/13/2010  . AVGG REMOVAL Right 04/10/2015   Procedure: REMOVAL OF RIGHT ARM  ARTERIOVENOUS GORETEX GRAFT (AVGG);  Surgeon: Fransisco Hertz,  MD;  Location: [facility] OR;  Service: Vascular;  Laterality: Right;  . EYE SURGERY     Cataract extraction  . INSERTION OF DIALYSIS CATHETER Left 04/10/2015   Procedure: INSERTION OF DIALYSIS CATHETER LEFT GROIN;  Surgeon: Fransisco Hertz, MD;  Location: [facility] OR;  Service: Vascular;  Laterality: Left;  . PERIPHERAL VASCULAR CATHETERIZATION Right 02/18/2015   Procedure: A/V Shuntogram/Fistulagram;  Surgeon: Annice Needy, MD;  Location: [facility] INVASIVE CV LAB;  Service: Cardiovascular;  Laterality: Right;  . PERIPHERAL VASCULAR CATHETERIZATION N/A 02/18/2015  Procedure: A/V Shunt Intervention;  Surgeon: Annice Needy, MD;  Location: [facility] INVASIVE CV LAB;  Service: Cardiovascular;  Laterality: N/A;  . PERIPHERAL VASCULAR CATHETERIZATION N/A 03/06/2015   Procedure: Dialysis/Perma Catheter Insertion;  Surgeon: Annice Needy, MD;  Location: [facility] INVASIVE CV LAB;  Service: Cardiovascular;  Laterality: N/A;  . REMOVAL OF A DIALYSIS CATHETER Right 04/10/2015   Procedure: REMOVAL OF RIGHT GROIN DIALYSIS CATHETER;  Surgeon: Fransisco Hertz, MD;  Location[facility] OR;  Service: Vascular;  Laterality: Right;  . REVISION OF ARTERIOVENOUS GORETEX GRAFT Right 03/08/2015   Procedure: REVISION OF ARTERIOVENOUS GORETEX GRAFT;  Surgeon: Renford Dills, MD;  Location: [facility] ORS;  Service: Vascular;  Laterality: Right;  . THROMBECTOMY AND REVISION OF ARTERIOVENTOUS (AV) GORETEX  GRAFT Left 12/2004; 01/2006; 03/12/2009   forearmnotes 10/13/2010; forearmnotes 10/13/2010; upper arm/notes 03/19/2009  . VITRECTOMY Left 03/2001   with membrane peel/notes 10/13/2010   Family History Family History  Problem Relation Age of Onset  . Hypertension Mother     Social History Social History  Substance Use Topics  . Smoking status: Never Smoker  . Smokeless tobacco: Never Used  . Alcohol use No   Allergies Aricept [donepezil hcl]  Review of Systems Review of Systems  Unable to perform ROS: Dementia    Physical Exam Vital Signs  I have reviewed  the triage vital signs BP 126/60   Pulse 66   Temp 99.8 F (37.7 C) (Oral)   Resp 16   Ht 5\' 1"  (1.549 m)   Wt 56.7 kg (125 lb)   SpO2 97%   BMI 23.62 kg/m   Physical Exam  Constitutional: She is oriented to person, place, and time. She appears well-developed and well-nourished. No distress.  HENT:  Head: Normocephalic and atraumatic.  Nose: Nose normal.  Eyes: Pupils are equal, round, and reactive to light. Conjunctivae and EOM are normal. Right eye exhibits no discharge. Left eye exhibits no discharge. No scleral icterus.  Neck: Normal range of motion. Neck supple.  Cardiovascular: Normal rate and regular rhythm.  Exam reveals no gallop and no friction rub.   No murmur heard. Pulmonary/Chest: Effort normal and breath sounds normal. No stridor. No respiratory distress. She has no rales.  Abdominal: Soft. She exhibits no distension. There is no tenderness. There is no rigidity, no rebound, no guarding and no CVA tenderness.  Musculoskeletal: She exhibits no edema or tenderness.       Arms: Neurological: She is alert and oriented to person, place, and time.  Skin: Skin is warm and dry. No rash noted. She is not diaphoretic. No erythema.  Psychiatric: She has a normal mood and affect.  Vitals reviewed.   ED Results and Treatments Labs (all labs ordered are listed, but only abnormal results are displayed) Labs Reviewed  COMPREHENSIVE METABOLIC PANEL - Abnormal; Notable for the following:       Result Value   Chloride 95 (*)    Glucose, Bld 125 (*)    Creatinine, Ser 3.84 (*)    Calcium 8.7 (*)    Albumin 3.4 (*)    ALT 11 (*)    GFR calc non Af Amer 10 (*)    GFR calc Af Amer 11 (*)    All other components within normal limits  CBC WITH DIFFERENTIAL/PLATELET - Abnormal; Notable for the following:    WBC 3.8 (*)    RBC 3.43 (*)    Hemoglobin 10.2 (*)    HCT 31.6 (*)    RDW 19.0 (*)  All other components within normal limits  OVA + PARASITE EXAM  GASTROINTESTINAL  PANEL BY PCR, STOOL (REPLACES STOOL CULTURE)  C DIFFICILE QUICK SCREEN W PCR REFLEX                                                                                                                         EKG  EKG Interpretation  Date/Time:    Ventricular Rate:    PR Interval:    QRS Duration:   QT Interval:    QTC Calculation:   R Axis:     Text Interpretation:        Radiology No results found. Pertinent labs & imaging results that were available during my care of the patient were reviewed by me and considered in my medical decision making (see chart for details).  Medications Ordered in ED Medications - No data to display                                                                                                                                  Procedures Procedures  (including critical care time)  Medical Decision Making / ED Course I have reviewed the nursing notes for this encounter and the patient's prior records (if available in EHR or on provided paperwork).    Diarrhea of unknown etiology.  Patient is on several medications with side effects including diarrhea, most prominent Renvela.  However she does have a history of prior C. difficile but has not had any recent antibiotics and is currently not having any abdominal pain.  Screening labs obtain without leukocytosis.  Rest of the labs are grossly reassuring and at her baseline.  Stool studies were ordered however patient did not have any bowel movement in the emergency department.  We will provide the patient with collection cup and recommend they follow-up with primary care provider for further evaluation and management.  The patient is safe for discharge with strict return precautions.   Final Clinical Impression(s) / ED Diagnoses Final diagnoses:  Diarrhea, unspecified type   Disposition: Discharge  Condition: Good  I have discussed the results, Dx and Tx plan with the patient and daughter who expressed  understanding and agree(s) with the plan. Discharge instructions discussed at great length. The patient and daughter were given strict return precautions who verbalized understanding of the instructions. No further questions at time of discharge.    New Prescriptions  No medications on file    Follow Up: Eustaquio Boyden, MD [address] [place] [address] [phone]   in 2-3 days for further evaluation of the diarrhea    This chart was dictated using voice recognition software.  Despite best efforts to proofread,  errors can occur which can change the documentation meaning.   Nira Conn, MD 03/20/17 2137

## 2017-03-20 NOTE — Discharge Instructions (Signed)
Please take a stool sample to your primary care provider for further evaluation and diagnostic studies.

## 2017-03-20 NOTE — ED Notes (Signed)
Pt and caregiver aware of need for stool specimen.

## 2017-03-20 NOTE — ED Triage Notes (Signed)
Pt is dialysis pt, last treatment was this am. Family reports pt having diarrhea for several days. Denies n/v or fever. Pt has hx of dementia but they state she may be more confused than normal. Hypotensive at triage but may be norm considering dialysis was this am. Pt does not make urine but pt had similar symptoms in past and it was related to UTI.

## 2017-03-22 ENCOUNTER — Ambulatory Visit (INDEPENDENT_AMBULATORY_CARE_PROVIDER_SITE_OTHER): Payer: Medicare Other | Admitting: Family Medicine

## 2017-03-22 ENCOUNTER — Encounter: Payer: Self-pay | Admitting: Family Medicine

## 2017-03-22 ENCOUNTER — Telehealth: Payer: Self-pay

## 2017-03-22 VITALS — BP 116/58 | HR 76 | Temp 98.3°F | Wt 118.0 lb

## 2017-03-22 DIAGNOSIS — Z992 Dependence on renal dialysis: Secondary | ICD-10-CM | POA: Diagnosis not present

## 2017-03-22 DIAGNOSIS — N186 End stage renal disease: Secondary | ICD-10-CM | POA: Diagnosis not present

## 2017-03-22 DIAGNOSIS — R197 Diarrhea, unspecified: Secondary | ICD-10-CM

## 2017-03-22 DIAGNOSIS — Z23 Encounter for immunization: Secondary | ICD-10-CM

## 2017-03-22 NOTE — Assessment & Plan Note (Addendum)
She was sent home from ER with stool kit - unable to collect while there. Pt does not know anything about stool collection kit. I will touch base with daughter about this and if ongoing diarrhea will recommend we try to collect.

## 2017-03-22 NOTE — Progress Notes (Signed)
BP (!) 116/58 (BP Location: Left Arm, Patient Position: Sitting, Cuff Size: Normal)   Pulse 76   Temp 98.3 F (36.8 C) (Oral)   Wt 118 lb (53.5 kg)   SpO2 98%   BMI 22.30 kg/m    CC: diarrhea Subjective:    Patient ID: [name], female    DOB: December 17, 1928, 83 y.o.   MRN: [id number]  HPI: [name] is a 83 y.o. female presenting on 03/22/2017 for Diarrhea (Not sure how long. Sometimes has itching )   Here alone today. Husband in waiting room. Daughter Nicki Reaper at work. H/o dementia complicates history, h/o sundowning. They do have caregiver at home to help as well.   Seen at ER over the weekend. Note and labs reviewed. 2 wks of diarrhea - watery and loose - with intermittent abdominal discomfort. Imodium helps. Sent home with stool collection kit to bring in today (for C diff testing given hx).   She states she is having some diarrhea but "not lately". Some abdominal pain yesterday, not today. Denies fevers/chills, nausea/vomiting, denies increased fatigue. She does feel more sleepy.   H/o recurrent UTIs, latest 10/2016 treated with cipro 21m BID 5d course.  H/o C iff diarrhea 2016.  ESRD on HD TThSat. Anuric. H/o infected vascular graft (05/2015). H/o staph aureus bacteremia in the past.   Relevant past medical, surgical, family and social history reviewed and updated as indicated. Interim medical history since our last visit reviewed. Allergies and medications reviewed and updated. Outpatient Medications Prior to Visit  Medication Sig Dispense Refill  . acetaminophen (TYLENOL) 500 MG tablet Take 500 mg by mouth 2 (two) times daily as needed for mild pain.     .Marland KitchenamLODipine (NORVASC) 5 MG tablet TAKE ONE-HALF TABLET BY MOUTH ON SUNDAY, [place] WEDNESDAY, AND FRIDAY. TAKE ONE TABLET BY MOUTH ALL OTHER DAYS OF THE WEEK (Patient taking differently: TAKE 5 mg TABLET BY MOUTH ON SUNDAY, MONDAY, WEDNESDAY, AND FRIDAY.) 90 tablet 1  . aspirin 81 MG tablet Take 81 mg by mouth  daily.    . carvedilol (COREG) 3.125 MG tablet TAKE ONE TABLET BY MOUTH TWICE DAILY WITH MEALS (Patient taking differently: TAKE 3.125 mg BY MOUTH TWICE DAILY WITH MEALS) 180 tablet 1  . cinacalcet (SENSIPAR) 30 MG tablet Take 30 mg by mouth daily.    . ciprofloxacin (CIPRO) 250 MG tablet Take 1 tablet (250 mg total) by mouth 2 (two) times daily. 10 tablet 0  . Dorzolamide HCl-Timolol Mal (COSOPT OP) Place 1 drop into both eyes 2 (two) times daily.     .Marland Kitchenescitalopram (LEXAPRO) 10 MG tablet TAKE ONE-HALF TABLET BY MOUTH ONCE DAILY (Patient taking differently: TAKE 5 mg TABLET BY MOUTH ONCE DAILY) 45 tablet 3  . famotidine (PEPCID) 20 MG tablet Take 1 tablet (20 mg total) by mouth daily.    . folic acid-vitamin b complex-vitamin c-selenium-zinc (DIALYVITE) 3 MG TABS Take 1 tablet by mouth daily.      . memantine (NAMENDA) 5 MG tablet Take one tablet twice daily. Only take 2 tablets (115m in evening on dialysis days (Patient taking differently: Take 5 mg by mouth See admin instructions. Take 5 mg  in the morning  And 5 mg in the evening on all the other days. Only take 2 tablets (1066mtues.  thurs. and sat. daily) 180 tablet 1  . multivitamin (RENA-VIT) TABS tablet TAKE ONE TABLET BY MOUTH AT BEDTIME 90 tablet 3  . ondansetron (ZOFRAN ODT) 4 MG disintegrating  tablet Take 1 tablet (4 mg total) by mouth every 8 (eight) hours as needed for nausea. 6 tablet 0  . sevelamer carbonate (RENVELA) 0.8 g PACK packet Take 0.8 g by mouth 3 (three) times daily with meals.    . sevelamer carbonate (RENVELA) 800 MG tablet Take 2 tablets (1,600 mg total) by mouth 3 (three) times daily with meals. 180 tablet 1   No facility-administered medications prior to visit.      Per HPI unless specifically indicated in ROS section below Review of Systems     Objective:    BP (!) 116/58 (BP Location: Left Arm, Patient Position: Sitting, Cuff Size: Normal)   Pulse 76   Temp 98.3 F (36.8 C) (Oral)   Wt 118 lb (53.5 kg)    SpO2 98%   BMI 22.30 kg/m   Wt Readings from Last 3 Encounters:  03/22/17 118 lb (53.5 kg)  03/20/17 125 lb (56.7 kg)  11/23/16 122 lb 12 oz (55.7 kg)    Physical Exam  Constitutional: She appears well-developed and well-nourished. No distress.  In wheelchair Overall well appearing today, lucid, responds to questions appropriately Remembers her dialysis days and that tomorrow is one  HENT:  Head: Normocephalic and atraumatic.  Right Ear: Decreased hearing is noted.  Left Ear: Decreased hearing is noted.  Mouth/Throat: Oropharynx is clear and moist. No oropharyngeal exudate.  Eyes: Pupils are equal, round, and reactive to light. Conjunctivae and EOM are normal.  Cardiovascular: Normal rate, regular rhythm, normal heart sounds and intact distal pulses.   No murmur heard. Pulmonary/Chest: Effort normal and breath sounds normal. No respiratory distress. She has no wheezes. She has no rales.  Abdominal: Soft. Bowel sounds are normal. She exhibits no distension and no mass. There is no tenderness. There is no rebound and no guarding.  Musculoskeletal: She exhibits no edema.  Skin: Skin is warm and dry. No rash noted.  Psychiatric: She has a normal mood and affect.  Nursing note and vitals reviewed.  Results for orders placed or performed during the hospital encounter of 03/20/17  Comprehensive metabolic panel  Result Value Ref Range   Sodium 137 135 - 145 mmol/L   Potassium 3.5 3.5 - 5.1 mmol/L   Chloride 95 (L) 101 - 111 mmol/L   CO2 31 22 - 32 mmol/L   Glucose, Bld 125 (H) 65 - 99 mg/dL   BUN 14 6 - 20 mg/dL   Creatinine, Ser 3.84 (H) 0.44 - 1.00 mg/dL   Calcium 8.7 (L) 8.9 - 10.3 mg/dL   Total Protein 6.7 6.5 - 8.1 g/dL   Albumin 3.4 (L) 3.5 - 5.0 g/dL   AST 21 15 - 41 U/L   ALT 11 (L) 14 - 54 U/L   Alkaline Phosphatase 68 38 - 126 U/L   Total Bilirubin 0.6 0.3 - 1.2 mg/dL   GFR calc non Af Amer 10 (L) >60 mL/min   GFR calc Af Amer 11 (L) >60 mL/min   Anion gap 11 5 - 15   CBC with Differential  Result Value Ref Range   WBC 3.8 (L) 4.0 - 10.5 K/uL   RBC 3.43 (L) 3.87 - 5.11 MIL/uL   Hemoglobin 10.2 (L) 12.0 - 15.0 g/dL   HCT 31.6 (L) 36.0 - 46.0 %   MCV 92.1 78.0 - 100.0 fL   MCH 29.7 26.0 - 34.0 pg   MCHC 32.3 30.0 - 36.0 g/dL   RDW 19.0 (H) 11.5 - 15.5 %   Platelets 90 (  L) 150 - 400 K/uL   Neutrophils Relative % 48 %   Neutro Abs 1.8 1.7 - 7.7 K/uL   Lymphocytes Relative 34 %   Lymphs Abs 1.3 0.7 - 4.0 K/uL   Monocytes Relative 12 %   Monocytes Absolute 0.5 0.1 - 1.0 K/uL   Eosinophils Relative 5 %   Eosinophils Absolute 0.2 0.0 - 0.7 K/uL   Basophils Relative 1 %   Basophils Absolute 0.0 0.0 - 0.1 K/uL      Assessment & Plan:   Problem List Items Addressed This Visit    Diarrhea - Primary    She was sent home from ER with stool kit - unable to collect while there. Pt does not know anything about stool collection kit. I will touch base with daughter about this and if ongoing diarrhea will recommend we try to collect.       ESRD on hemodialysis [facility])    Will try and touch base with daughter - she is at work. Did not answer when I called cell today.  Remains anuric, however no obvious signs of UTI today - so no need to treat with abx.  Lab work showing worsening pancytopenia namely anemia. Albumin levels lower as well. Will want to ensure appetite and nutrition are optimized.           Follow up plan: Return if symptoms worsen or fail to improve.  Ria Bush, MD

## 2017-03-22 NOTE — Telephone Encounter (Signed)
[  03/22/2017 4:14 PM] Lewanda RifeIsley, Netanya Yazdani:  Dr Reece AgarG said to get pts daughters contact # and he will return her call in about 30 mins. [03/22/2017 4:16 PM] Viviann SpareWhite, SelinaDorice Lamas:  [name] Townsend [phone] Thank you for your help.

## 2017-03-22 NOTE — Assessment & Plan Note (Signed)
Will try and touch base with daughter - she is at work. Did not answer when I called cell today.  Remains anuric, however no obvious signs of UTI today - so no need to treat with abx.  Lab work showing worsening pancytopenia namely anemia. Albumin levels lower as well. Will want to ensure appetite and nutrition are optimized.

## 2017-03-22 NOTE — Addendum Note (Signed)
Addended by: Nanci PinaGOINS, Ardelle Haliburton on: 03/22/2017 03:32 PM   Modules accepted: Orders

## 2017-03-22 NOTE — Telephone Encounter (Signed)
Spoke with daughter

## 2017-03-22 NOTE — Patient Instructions (Addendum)
Thank you for coming in today.  Good to see you today. I think you are doing well.  If ongoing diarrhea, collect stool sample in containers provided at ER and bring in.  Call us with any questions or concerns.  Flu shot today.

## 2017-03-23 DIAGNOSIS — Z992 Dependence on renal dialysis: Secondary | ICD-10-CM | POA: Diagnosis not present

## 2017-03-23 DIAGNOSIS — N186 End stage renal disease: Secondary | ICD-10-CM | POA: Diagnosis not present

## 2017-03-23 DIAGNOSIS — E1129 Type 2 diabetes mellitus with other diabetic kidney complication: Secondary | ICD-10-CM | POA: Diagnosis not present

## 2017-03-23 DIAGNOSIS — D631 Anemia in chronic kidney disease: Secondary | ICD-10-CM | POA: Diagnosis not present

## 2017-03-23 DIAGNOSIS — N2581 Secondary hyperparathyroidism of renal origin: Secondary | ICD-10-CM | POA: Diagnosis not present

## 2017-03-24 ENCOUNTER — Telehealth: Payer: Self-pay | Admitting: Radiology

## 2017-03-24 NOTE — Telephone Encounter (Signed)
Patients daughter dropped off a stool sample which  wasn't in the correct containers for the tests that the hospital ordered. The stool was also firm. Per Dr Sharen HonesGutierrez I contacted the patients daughter to tell her that the sample was not usable and that if her diarrhea comes back to come to the lab for the correct containers and we would do the tests at that time. She agreed

## 2017-03-24 NOTE — Telephone Encounter (Signed)
error 

## 2017-03-25 DIAGNOSIS — E1129 Type 2 diabetes mellitus with other diabetic kidney complication: Secondary | ICD-10-CM | POA: Diagnosis not present

## 2017-03-25 DIAGNOSIS — D631 Anemia in chronic kidney disease: Secondary | ICD-10-CM | POA: Diagnosis not present

## 2017-03-25 DIAGNOSIS — Z992 Dependence on renal dialysis: Secondary | ICD-10-CM | POA: Diagnosis not present

## 2017-03-25 DIAGNOSIS — N186 End stage renal disease: Secondary | ICD-10-CM | POA: Diagnosis not present

## 2017-03-25 DIAGNOSIS — N2581 Secondary hyperparathyroidism of renal origin: Secondary | ICD-10-CM | POA: Diagnosis not present

## 2017-03-27 DIAGNOSIS — D631 Anemia in chronic kidney disease: Secondary | ICD-10-CM | POA: Diagnosis not present

## 2017-03-27 DIAGNOSIS — Z992 Dependence on renal dialysis: Secondary | ICD-10-CM | POA: Diagnosis not present

## 2017-03-27 DIAGNOSIS — N2581 Secondary hyperparathyroidism of renal origin: Secondary | ICD-10-CM | POA: Diagnosis not present

## 2017-03-27 DIAGNOSIS — E1129 Type 2 diabetes mellitus with other diabetic kidney complication: Secondary | ICD-10-CM | POA: Diagnosis not present

## 2017-03-27 DIAGNOSIS — N186 End stage renal disease: Secondary | ICD-10-CM | POA: Diagnosis not present

## 2017-03-30 DIAGNOSIS — E1129 Type 2 diabetes mellitus with other diabetic kidney complication: Secondary | ICD-10-CM | POA: Diagnosis not present

## 2017-03-30 DIAGNOSIS — D631 Anemia in chronic kidney disease: Secondary | ICD-10-CM | POA: Diagnosis not present

## 2017-03-30 DIAGNOSIS — N186 End stage renal disease: Secondary | ICD-10-CM | POA: Diagnosis not present

## 2017-03-30 DIAGNOSIS — Z992 Dependence on renal dialysis: Secondary | ICD-10-CM | POA: Diagnosis not present

## 2017-03-30 DIAGNOSIS — N2581 Secondary hyperparathyroidism of renal origin: Secondary | ICD-10-CM | POA: Diagnosis not present

## 2017-03-31 DIAGNOSIS — Z992 Dependence on renal dialysis: Secondary | ICD-10-CM | POA: Diagnosis not present

## 2017-03-31 DIAGNOSIS — N186 End stage renal disease: Secondary | ICD-10-CM | POA: Diagnosis not present

## 2017-03-31 DIAGNOSIS — E1129 Type 2 diabetes mellitus with other diabetic kidney complication: Secondary | ICD-10-CM | POA: Diagnosis not present

## 2017-04-01 DIAGNOSIS — D631 Anemia in chronic kidney disease: Secondary | ICD-10-CM | POA: Diagnosis not present

## 2017-04-01 DIAGNOSIS — E1129 Type 2 diabetes mellitus with other diabetic kidney complication: Secondary | ICD-10-CM | POA: Diagnosis not present

## 2017-04-01 DIAGNOSIS — N186 End stage renal disease: Secondary | ICD-10-CM | POA: Diagnosis not present

## 2017-04-01 DIAGNOSIS — N2581 Secondary hyperparathyroidism of renal origin: Secondary | ICD-10-CM | POA: Diagnosis not present

## 2017-04-01 DIAGNOSIS — D509 Iron deficiency anemia, unspecified: Secondary | ICD-10-CM | POA: Diagnosis not present

## 2017-04-03 DIAGNOSIS — D509 Iron deficiency anemia, unspecified: Secondary | ICD-10-CM | POA: Diagnosis not present

## 2017-04-03 DIAGNOSIS — E1129 Type 2 diabetes mellitus with other diabetic kidney complication: Secondary | ICD-10-CM | POA: Diagnosis not present

## 2017-04-03 DIAGNOSIS — N2581 Secondary hyperparathyroidism of renal origin: Secondary | ICD-10-CM | POA: Diagnosis not present

## 2017-04-03 DIAGNOSIS — N186 End stage renal disease: Secondary | ICD-10-CM | POA: Diagnosis not present

## 2017-04-03 DIAGNOSIS — D631 Anemia in chronic kidney disease: Secondary | ICD-10-CM | POA: Diagnosis not present

## 2017-04-06 DIAGNOSIS — N186 End stage renal disease: Secondary | ICD-10-CM | POA: Diagnosis not present

## 2017-04-06 DIAGNOSIS — N2581 Secondary hyperparathyroidism of renal origin: Secondary | ICD-10-CM | POA: Diagnosis not present

## 2017-04-06 DIAGNOSIS — D509 Iron deficiency anemia, unspecified: Secondary | ICD-10-CM | POA: Diagnosis not present

## 2017-04-06 DIAGNOSIS — D631 Anemia in chronic kidney disease: Secondary | ICD-10-CM | POA: Diagnosis not present

## 2017-04-06 DIAGNOSIS — E1129 Type 2 diabetes mellitus with other diabetic kidney complication: Secondary | ICD-10-CM | POA: Diagnosis not present

## 2017-04-08 DIAGNOSIS — D631 Anemia in chronic kidney disease: Secondary | ICD-10-CM | POA: Diagnosis not present

## 2017-04-08 DIAGNOSIS — N186 End stage renal disease: Secondary | ICD-10-CM | POA: Diagnosis not present

## 2017-04-08 DIAGNOSIS — E1129 Type 2 diabetes mellitus with other diabetic kidney complication: Secondary | ICD-10-CM | POA: Diagnosis not present

## 2017-04-08 DIAGNOSIS — D509 Iron deficiency anemia, unspecified: Secondary | ICD-10-CM | POA: Diagnosis not present

## 2017-04-08 DIAGNOSIS — N2581 Secondary hyperparathyroidism of renal origin: Secondary | ICD-10-CM | POA: Diagnosis not present

## 2017-04-10 DIAGNOSIS — N186 End stage renal disease: Secondary | ICD-10-CM | POA: Diagnosis not present

## 2017-04-10 DIAGNOSIS — D631 Anemia in chronic kidney disease: Secondary | ICD-10-CM | POA: Diagnosis not present

## 2017-04-10 DIAGNOSIS — D509 Iron deficiency anemia, unspecified: Secondary | ICD-10-CM | POA: Diagnosis not present

## 2017-04-10 DIAGNOSIS — E1129 Type 2 diabetes mellitus with other diabetic kidney complication: Secondary | ICD-10-CM | POA: Diagnosis not present

## 2017-04-10 DIAGNOSIS — N2581 Secondary hyperparathyroidism of renal origin: Secondary | ICD-10-CM | POA: Diagnosis not present

## 2017-04-13 DIAGNOSIS — D509 Iron deficiency anemia, unspecified: Secondary | ICD-10-CM | POA: Diagnosis not present

## 2017-04-13 DIAGNOSIS — N2581 Secondary hyperparathyroidism of renal origin: Secondary | ICD-10-CM | POA: Diagnosis not present

## 2017-04-13 DIAGNOSIS — E1129 Type 2 diabetes mellitus with other diabetic kidney complication: Secondary | ICD-10-CM | POA: Diagnosis not present

## 2017-04-13 DIAGNOSIS — N186 End stage renal disease: Secondary | ICD-10-CM | POA: Diagnosis not present

## 2017-04-13 DIAGNOSIS — D631 Anemia in chronic kidney disease: Secondary | ICD-10-CM | POA: Diagnosis not present

## 2017-04-15 DIAGNOSIS — N186 End stage renal disease: Secondary | ICD-10-CM | POA: Diagnosis not present

## 2017-04-15 DIAGNOSIS — N2581 Secondary hyperparathyroidism of renal origin: Secondary | ICD-10-CM | POA: Diagnosis not present

## 2017-04-15 DIAGNOSIS — D509 Iron deficiency anemia, unspecified: Secondary | ICD-10-CM | POA: Diagnosis not present

## 2017-04-15 DIAGNOSIS — D631 Anemia in chronic kidney disease: Secondary | ICD-10-CM | POA: Diagnosis not present

## 2017-04-15 DIAGNOSIS — E1129 Type 2 diabetes mellitus with other diabetic kidney complication: Secondary | ICD-10-CM | POA: Diagnosis not present

## 2017-04-17 DIAGNOSIS — N186 End stage renal disease: Secondary | ICD-10-CM | POA: Diagnosis not present

## 2017-04-17 DIAGNOSIS — D631 Anemia in chronic kidney disease: Secondary | ICD-10-CM | POA: Diagnosis not present

## 2017-04-17 DIAGNOSIS — D509 Iron deficiency anemia, unspecified: Secondary | ICD-10-CM | POA: Diagnosis not present

## 2017-04-17 DIAGNOSIS — E1129 Type 2 diabetes mellitus with other diabetic kidney complication: Secondary | ICD-10-CM | POA: Diagnosis not present

## 2017-04-17 DIAGNOSIS — N2581 Secondary hyperparathyroidism of renal origin: Secondary | ICD-10-CM | POA: Diagnosis not present

## 2017-04-21 DIAGNOSIS — E1129 Type 2 diabetes mellitus with other diabetic kidney complication: Secondary | ICD-10-CM | POA: Diagnosis not present

## 2017-04-21 DIAGNOSIS — D631 Anemia in chronic kidney disease: Secondary | ICD-10-CM | POA: Diagnosis not present

## 2017-04-21 DIAGNOSIS — N186 End stage renal disease: Secondary | ICD-10-CM | POA: Diagnosis not present

## 2017-04-21 DIAGNOSIS — N2581 Secondary hyperparathyroidism of renal origin: Secondary | ICD-10-CM | POA: Diagnosis not present

## 2017-04-21 DIAGNOSIS — D509 Iron deficiency anemia, unspecified: Secondary | ICD-10-CM | POA: Diagnosis not present

## 2017-04-24 DIAGNOSIS — D509 Iron deficiency anemia, unspecified: Secondary | ICD-10-CM | POA: Diagnosis not present

## 2017-04-24 DIAGNOSIS — N186 End stage renal disease: Secondary | ICD-10-CM | POA: Diagnosis not present

## 2017-04-24 DIAGNOSIS — N2581 Secondary hyperparathyroidism of renal origin: Secondary | ICD-10-CM | POA: Diagnosis not present

## 2017-04-24 DIAGNOSIS — D631 Anemia in chronic kidney disease: Secondary | ICD-10-CM | POA: Diagnosis not present

## 2017-04-24 DIAGNOSIS — E1129 Type 2 diabetes mellitus with other diabetic kidney complication: Secondary | ICD-10-CM | POA: Diagnosis not present

## 2017-04-27 DIAGNOSIS — N186 End stage renal disease: Secondary | ICD-10-CM | POA: Diagnosis not present

## 2017-04-27 DIAGNOSIS — N2581 Secondary hyperparathyroidism of renal origin: Secondary | ICD-10-CM | POA: Diagnosis not present

## 2017-04-27 DIAGNOSIS — D509 Iron deficiency anemia, unspecified: Secondary | ICD-10-CM | POA: Diagnosis not present

## 2017-04-27 DIAGNOSIS — E1129 Type 2 diabetes mellitus with other diabetic kidney complication: Secondary | ICD-10-CM | POA: Diagnosis not present

## 2017-04-27 DIAGNOSIS — D631 Anemia in chronic kidney disease: Secondary | ICD-10-CM | POA: Diagnosis not present

## 2017-04-29 DIAGNOSIS — D509 Iron deficiency anemia, unspecified: Secondary | ICD-10-CM | POA: Diagnosis not present

## 2017-04-29 DIAGNOSIS — D631 Anemia in chronic kidney disease: Secondary | ICD-10-CM | POA: Diagnosis not present

## 2017-04-29 DIAGNOSIS — N186 End stage renal disease: Secondary | ICD-10-CM | POA: Diagnosis not present

## 2017-04-29 DIAGNOSIS — E1129 Type 2 diabetes mellitus with other diabetic kidney complication: Secondary | ICD-10-CM | POA: Diagnosis not present

## 2017-04-29 DIAGNOSIS — N2581 Secondary hyperparathyroidism of renal origin: Secondary | ICD-10-CM | POA: Diagnosis not present

## 2017-04-30 DIAGNOSIS — N186 End stage renal disease: Secondary | ICD-10-CM | POA: Diagnosis not present

## 2017-04-30 DIAGNOSIS — E1129 Type 2 diabetes mellitus with other diabetic kidney complication: Secondary | ICD-10-CM | POA: Diagnosis not present

## 2017-04-30 DIAGNOSIS — Z992 Dependence on renal dialysis: Secondary | ICD-10-CM | POA: Diagnosis not present

## 2017-05-01 DIAGNOSIS — D631 Anemia in chronic kidney disease: Secondary | ICD-10-CM | POA: Diagnosis not present

## 2017-05-01 DIAGNOSIS — D509 Iron deficiency anemia, unspecified: Secondary | ICD-10-CM | POA: Diagnosis not present

## 2017-05-01 DIAGNOSIS — E1129 Type 2 diabetes mellitus with other diabetic kidney complication: Secondary | ICD-10-CM | POA: Diagnosis not present

## 2017-05-01 DIAGNOSIS — N2581 Secondary hyperparathyroidism of renal origin: Secondary | ICD-10-CM | POA: Diagnosis not present

## 2017-05-01 DIAGNOSIS — N186 End stage renal disease: Secondary | ICD-10-CM | POA: Diagnosis not present

## 2017-05-04 DIAGNOSIS — D631 Anemia in chronic kidney disease: Secondary | ICD-10-CM | POA: Diagnosis not present

## 2017-05-04 DIAGNOSIS — E1129 Type 2 diabetes mellitus with other diabetic kidney complication: Secondary | ICD-10-CM | POA: Diagnosis not present

## 2017-05-04 DIAGNOSIS — N2581 Secondary hyperparathyroidism of renal origin: Secondary | ICD-10-CM | POA: Diagnosis not present

## 2017-05-04 DIAGNOSIS — D509 Iron deficiency anemia, unspecified: Secondary | ICD-10-CM | POA: Diagnosis not present

## 2017-05-04 DIAGNOSIS — N186 End stage renal disease: Secondary | ICD-10-CM | POA: Diagnosis not present

## 2017-05-06 DIAGNOSIS — D631 Anemia in chronic kidney disease: Secondary | ICD-10-CM | POA: Diagnosis not present

## 2017-05-06 DIAGNOSIS — E1129 Type 2 diabetes mellitus with other diabetic kidney complication: Secondary | ICD-10-CM | POA: Diagnosis not present

## 2017-05-06 DIAGNOSIS — N2581 Secondary hyperparathyroidism of renal origin: Secondary | ICD-10-CM | POA: Diagnosis not present

## 2017-05-06 DIAGNOSIS — N186 End stage renal disease: Secondary | ICD-10-CM | POA: Diagnosis not present

## 2017-05-06 DIAGNOSIS — D509 Iron deficiency anemia, unspecified: Secondary | ICD-10-CM | POA: Diagnosis not present

## 2017-05-08 DIAGNOSIS — E1129 Type 2 diabetes mellitus with other diabetic kidney complication: Secondary | ICD-10-CM | POA: Diagnosis not present

## 2017-05-08 DIAGNOSIS — D631 Anemia in chronic kidney disease: Secondary | ICD-10-CM | POA: Diagnosis not present

## 2017-05-08 DIAGNOSIS — N186 End stage renal disease: Secondary | ICD-10-CM | POA: Diagnosis not present

## 2017-05-08 DIAGNOSIS — N2581 Secondary hyperparathyroidism of renal origin: Secondary | ICD-10-CM | POA: Diagnosis not present

## 2017-05-08 DIAGNOSIS — D509 Iron deficiency anemia, unspecified: Secondary | ICD-10-CM | POA: Diagnosis not present

## 2017-05-12 DIAGNOSIS — N186 End stage renal disease: Secondary | ICD-10-CM | POA: Diagnosis not present

## 2017-05-12 DIAGNOSIS — E1129 Type 2 diabetes mellitus with other diabetic kidney complication: Secondary | ICD-10-CM | POA: Diagnosis not present

## 2017-05-12 DIAGNOSIS — D631 Anemia in chronic kidney disease: Secondary | ICD-10-CM | POA: Diagnosis not present

## 2017-05-12 DIAGNOSIS — N2581 Secondary hyperparathyroidism of renal origin: Secondary | ICD-10-CM | POA: Diagnosis not present

## 2017-05-12 DIAGNOSIS — D509 Iron deficiency anemia, unspecified: Secondary | ICD-10-CM | POA: Diagnosis not present

## 2017-05-13 DIAGNOSIS — D509 Iron deficiency anemia, unspecified: Secondary | ICD-10-CM | POA: Diagnosis not present

## 2017-05-13 DIAGNOSIS — E1129 Type 2 diabetes mellitus with other diabetic kidney complication: Secondary | ICD-10-CM | POA: Diagnosis not present

## 2017-05-13 DIAGNOSIS — D631 Anemia in chronic kidney disease: Secondary | ICD-10-CM | POA: Diagnosis not present

## 2017-05-13 DIAGNOSIS — N2581 Secondary hyperparathyroidism of renal origin: Secondary | ICD-10-CM | POA: Diagnosis not present

## 2017-05-13 DIAGNOSIS — N186 End stage renal disease: Secondary | ICD-10-CM | POA: Diagnosis not present

## 2017-05-15 DIAGNOSIS — N186 End stage renal disease: Secondary | ICD-10-CM | POA: Diagnosis not present

## 2017-05-15 DIAGNOSIS — E1129 Type 2 diabetes mellitus with other diabetic kidney complication: Secondary | ICD-10-CM | POA: Diagnosis not present

## 2017-05-15 DIAGNOSIS — D631 Anemia in chronic kidney disease: Secondary | ICD-10-CM | POA: Diagnosis not present

## 2017-05-15 DIAGNOSIS — D509 Iron deficiency anemia, unspecified: Secondary | ICD-10-CM | POA: Diagnosis not present

## 2017-05-15 DIAGNOSIS — N2581 Secondary hyperparathyroidism of renal origin: Secondary | ICD-10-CM | POA: Diagnosis not present

## 2017-05-18 DIAGNOSIS — E1129 Type 2 diabetes mellitus with other diabetic kidney complication: Secondary | ICD-10-CM | POA: Diagnosis not present

## 2017-05-18 DIAGNOSIS — D631 Anemia in chronic kidney disease: Secondary | ICD-10-CM | POA: Diagnosis not present

## 2017-05-18 DIAGNOSIS — N186 End stage renal disease: Secondary | ICD-10-CM | POA: Diagnosis not present

## 2017-05-18 DIAGNOSIS — D509 Iron deficiency anemia, unspecified: Secondary | ICD-10-CM | POA: Diagnosis not present

## 2017-05-18 DIAGNOSIS — N2581 Secondary hyperparathyroidism of renal origin: Secondary | ICD-10-CM | POA: Diagnosis not present

## 2017-05-20 DIAGNOSIS — D631 Anemia in chronic kidney disease: Secondary | ICD-10-CM | POA: Diagnosis not present

## 2017-05-20 DIAGNOSIS — D509 Iron deficiency anemia, unspecified: Secondary | ICD-10-CM | POA: Diagnosis not present

## 2017-05-20 DIAGNOSIS — E1129 Type 2 diabetes mellitus with other diabetic kidney complication: Secondary | ICD-10-CM | POA: Diagnosis not present

## 2017-05-20 DIAGNOSIS — N186 End stage renal disease: Secondary | ICD-10-CM | POA: Diagnosis not present

## 2017-05-20 DIAGNOSIS — N2581 Secondary hyperparathyroidism of renal origin: Secondary | ICD-10-CM | POA: Diagnosis not present

## 2017-05-22 DIAGNOSIS — E1129 Type 2 diabetes mellitus with other diabetic kidney complication: Secondary | ICD-10-CM | POA: Diagnosis not present

## 2017-05-22 DIAGNOSIS — N2581 Secondary hyperparathyroidism of renal origin: Secondary | ICD-10-CM | POA: Diagnosis not present

## 2017-05-22 DIAGNOSIS — D509 Iron deficiency anemia, unspecified: Secondary | ICD-10-CM | POA: Diagnosis not present

## 2017-05-22 DIAGNOSIS — D631 Anemia in chronic kidney disease: Secondary | ICD-10-CM | POA: Diagnosis not present

## 2017-05-22 DIAGNOSIS — N186 End stage renal disease: Secondary | ICD-10-CM | POA: Diagnosis not present

## 2017-05-24 DIAGNOSIS — D509 Iron deficiency anemia, unspecified: Secondary | ICD-10-CM | POA: Diagnosis not present

## 2017-05-24 DIAGNOSIS — E1129 Type 2 diabetes mellitus with other diabetic kidney complication: Secondary | ICD-10-CM | POA: Diagnosis not present

## 2017-05-24 DIAGNOSIS — N186 End stage renal disease: Secondary | ICD-10-CM | POA: Diagnosis not present

## 2017-05-24 DIAGNOSIS — D631 Anemia in chronic kidney disease: Secondary | ICD-10-CM | POA: Diagnosis not present

## 2017-05-24 DIAGNOSIS — N2581 Secondary hyperparathyroidism of renal origin: Secondary | ICD-10-CM | POA: Diagnosis not present

## 2017-05-26 ENCOUNTER — Other Ambulatory Visit: Payer: Self-pay | Admitting: Family Medicine

## 2017-05-27 DIAGNOSIS — N186 End stage renal disease: Secondary | ICD-10-CM | POA: Diagnosis not present

## 2017-05-27 DIAGNOSIS — D631 Anemia in chronic kidney disease: Secondary | ICD-10-CM | POA: Diagnosis not present

## 2017-05-27 DIAGNOSIS — D509 Iron deficiency anemia, unspecified: Secondary | ICD-10-CM | POA: Diagnosis not present

## 2017-05-27 DIAGNOSIS — N2581 Secondary hyperparathyroidism of renal origin: Secondary | ICD-10-CM | POA: Diagnosis not present

## 2017-05-27 DIAGNOSIS — E1129 Type 2 diabetes mellitus with other diabetic kidney complication: Secondary | ICD-10-CM | POA: Diagnosis not present

## 2017-05-29 DIAGNOSIS — N186 End stage renal disease: Secondary | ICD-10-CM | POA: Diagnosis not present

## 2017-05-29 DIAGNOSIS — N2581 Secondary hyperparathyroidism of renal origin: Secondary | ICD-10-CM | POA: Diagnosis not present

## 2017-05-29 DIAGNOSIS — D631 Anemia in chronic kidney disease: Secondary | ICD-10-CM | POA: Diagnosis not present

## 2017-05-29 DIAGNOSIS — E1129 Type 2 diabetes mellitus with other diabetic kidney complication: Secondary | ICD-10-CM | POA: Diagnosis not present

## 2017-05-29 DIAGNOSIS — D509 Iron deficiency anemia, unspecified: Secondary | ICD-10-CM | POA: Diagnosis not present

## 2017-05-31 DIAGNOSIS — Z992 Dependence on renal dialysis: Secondary | ICD-10-CM | POA: Diagnosis not present

## 2017-05-31 DIAGNOSIS — E1129 Type 2 diabetes mellitus with other diabetic kidney complication: Secondary | ICD-10-CM | POA: Diagnosis not present

## 2017-05-31 DIAGNOSIS — N2581 Secondary hyperparathyroidism of renal origin: Secondary | ICD-10-CM | POA: Diagnosis not present

## 2017-05-31 DIAGNOSIS — D631 Anemia in chronic kidney disease: Secondary | ICD-10-CM | POA: Diagnosis not present

## 2017-05-31 DIAGNOSIS — N186 End stage renal disease: Secondary | ICD-10-CM | POA: Diagnosis not present

## 2017-05-31 DIAGNOSIS — D509 Iron deficiency anemia, unspecified: Secondary | ICD-10-CM | POA: Diagnosis not present

## 2017-06-03 DIAGNOSIS — Z283 Underimmunization status: Secondary | ICD-10-CM | POA: Diagnosis not present

## 2017-06-03 DIAGNOSIS — N186 End stage renal disease: Secondary | ICD-10-CM | POA: Diagnosis not present

## 2017-06-03 DIAGNOSIS — N2581 Secondary hyperparathyroidism of renal origin: Secondary | ICD-10-CM | POA: Diagnosis not present

## 2017-06-03 DIAGNOSIS — Z23 Encounter for immunization: Secondary | ICD-10-CM | POA: Diagnosis not present

## 2017-06-03 DIAGNOSIS — E1129 Type 2 diabetes mellitus with other diabetic kidney complication: Secondary | ICD-10-CM | POA: Diagnosis not present

## 2017-06-03 DIAGNOSIS — D509 Iron deficiency anemia, unspecified: Secondary | ICD-10-CM | POA: Diagnosis not present

## 2017-06-05 DIAGNOSIS — E1129 Type 2 diabetes mellitus with other diabetic kidney complication: Secondary | ICD-10-CM | POA: Diagnosis not present

## 2017-06-05 DIAGNOSIS — N186 End stage renal disease: Secondary | ICD-10-CM | POA: Diagnosis not present

## 2017-06-05 DIAGNOSIS — D509 Iron deficiency anemia, unspecified: Secondary | ICD-10-CM | POA: Diagnosis not present

## 2017-06-05 DIAGNOSIS — N2581 Secondary hyperparathyroidism of renal origin: Secondary | ICD-10-CM | POA: Diagnosis not present

## 2017-06-05 DIAGNOSIS — Z23 Encounter for immunization: Secondary | ICD-10-CM | POA: Diagnosis not present

## 2017-06-05 DIAGNOSIS — Z283 Underimmunization status: Secondary | ICD-10-CM | POA: Diagnosis not present

## 2017-06-08 DIAGNOSIS — Z283 Underimmunization status: Secondary | ICD-10-CM | POA: Diagnosis not present

## 2017-06-08 DIAGNOSIS — D509 Iron deficiency anemia, unspecified: Secondary | ICD-10-CM | POA: Diagnosis not present

## 2017-06-08 DIAGNOSIS — N2581 Secondary hyperparathyroidism of renal origin: Secondary | ICD-10-CM | POA: Diagnosis not present

## 2017-06-08 DIAGNOSIS — Z23 Encounter for immunization: Secondary | ICD-10-CM | POA: Diagnosis not present

## 2017-06-08 DIAGNOSIS — N186 End stage renal disease: Secondary | ICD-10-CM | POA: Diagnosis not present

## 2017-06-08 DIAGNOSIS — E1129 Type 2 diabetes mellitus with other diabetic kidney complication: Secondary | ICD-10-CM | POA: Diagnosis not present

## 2017-06-10 DIAGNOSIS — E1129 Type 2 diabetes mellitus with other diabetic kidney complication: Secondary | ICD-10-CM | POA: Diagnosis not present

## 2017-06-10 DIAGNOSIS — N2581 Secondary hyperparathyroidism of renal origin: Secondary | ICD-10-CM | POA: Diagnosis not present

## 2017-06-10 DIAGNOSIS — Z283 Underimmunization status: Secondary | ICD-10-CM | POA: Diagnosis not present

## 2017-06-10 DIAGNOSIS — N186 End stage renal disease: Secondary | ICD-10-CM | POA: Diagnosis not present

## 2017-06-10 DIAGNOSIS — Z23 Encounter for immunization: Secondary | ICD-10-CM | POA: Diagnosis not present

## 2017-06-10 DIAGNOSIS — D509 Iron deficiency anemia, unspecified: Secondary | ICD-10-CM | POA: Diagnosis not present

## 2017-06-14 DIAGNOSIS — I871 Compression of vein: Secondary | ICD-10-CM | POA: Diagnosis not present

## 2017-06-14 DIAGNOSIS — Z992 Dependence on renal dialysis: Secondary | ICD-10-CM | POA: Diagnosis not present

## 2017-06-14 DIAGNOSIS — N186 End stage renal disease: Secondary | ICD-10-CM | POA: Diagnosis not present

## 2017-06-14 DIAGNOSIS — T82868A Thrombosis of vascular prosthetic devices, implants and grafts, initial encounter: Secondary | ICD-10-CM | POA: Diagnosis not present

## 2017-06-15 DIAGNOSIS — N186 End stage renal disease: Secondary | ICD-10-CM | POA: Diagnosis not present

## 2017-06-15 DIAGNOSIS — D509 Iron deficiency anemia, unspecified: Secondary | ICD-10-CM | POA: Diagnosis not present

## 2017-06-15 DIAGNOSIS — Z283 Underimmunization status: Secondary | ICD-10-CM | POA: Diagnosis not present

## 2017-06-15 DIAGNOSIS — Z23 Encounter for immunization: Secondary | ICD-10-CM | POA: Diagnosis not present

## 2017-06-15 DIAGNOSIS — N2581 Secondary hyperparathyroidism of renal origin: Secondary | ICD-10-CM | POA: Diagnosis not present

## 2017-06-15 DIAGNOSIS — E1129 Type 2 diabetes mellitus with other diabetic kidney complication: Secondary | ICD-10-CM | POA: Diagnosis not present

## 2017-06-17 DIAGNOSIS — D509 Iron deficiency anemia, unspecified: Secondary | ICD-10-CM | POA: Diagnosis not present

## 2017-06-17 DIAGNOSIS — N186 End stage renal disease: Secondary | ICD-10-CM | POA: Diagnosis not present

## 2017-06-17 DIAGNOSIS — N2581 Secondary hyperparathyroidism of renal origin: Secondary | ICD-10-CM | POA: Diagnosis not present

## 2017-06-17 DIAGNOSIS — Z283 Underimmunization status: Secondary | ICD-10-CM | POA: Diagnosis not present

## 2017-06-17 DIAGNOSIS — Z23 Encounter for immunization: Secondary | ICD-10-CM | POA: Diagnosis not present

## 2017-06-17 DIAGNOSIS — E1129 Type 2 diabetes mellitus with other diabetic kidney complication: Secondary | ICD-10-CM | POA: Diagnosis not present

## 2017-06-19 DIAGNOSIS — N2581 Secondary hyperparathyroidism of renal origin: Secondary | ICD-10-CM | POA: Diagnosis not present

## 2017-06-19 DIAGNOSIS — N186 End stage renal disease: Secondary | ICD-10-CM | POA: Diagnosis not present

## 2017-06-19 DIAGNOSIS — E1129 Type 2 diabetes mellitus with other diabetic kidney complication: Secondary | ICD-10-CM | POA: Diagnosis not present

## 2017-06-19 DIAGNOSIS — Z283 Underimmunization status: Secondary | ICD-10-CM | POA: Diagnosis not present

## 2017-06-19 DIAGNOSIS — D509 Iron deficiency anemia, unspecified: Secondary | ICD-10-CM | POA: Diagnosis not present

## 2017-06-19 DIAGNOSIS — Z23 Encounter for immunization: Secondary | ICD-10-CM | POA: Diagnosis not present

## 2017-06-22 DIAGNOSIS — Z23 Encounter for immunization: Secondary | ICD-10-CM | POA: Diagnosis not present

## 2017-06-22 DIAGNOSIS — N2581 Secondary hyperparathyroidism of renal origin: Secondary | ICD-10-CM | POA: Diagnosis not present

## 2017-06-22 DIAGNOSIS — E1129 Type 2 diabetes mellitus with other diabetic kidney complication: Secondary | ICD-10-CM | POA: Diagnosis not present

## 2017-06-22 DIAGNOSIS — Z283 Underimmunization status: Secondary | ICD-10-CM | POA: Diagnosis not present

## 2017-06-22 DIAGNOSIS — D509 Iron deficiency anemia, unspecified: Secondary | ICD-10-CM | POA: Diagnosis not present

## 2017-06-22 DIAGNOSIS — N186 End stage renal disease: Secondary | ICD-10-CM | POA: Diagnosis not present

## 2017-06-24 DIAGNOSIS — N186 End stage renal disease: Secondary | ICD-10-CM | POA: Diagnosis not present

## 2017-06-24 DIAGNOSIS — N2581 Secondary hyperparathyroidism of renal origin: Secondary | ICD-10-CM | POA: Diagnosis not present

## 2017-06-24 DIAGNOSIS — Z23 Encounter for immunization: Secondary | ICD-10-CM | POA: Diagnosis not present

## 2017-06-24 DIAGNOSIS — E1129 Type 2 diabetes mellitus with other diabetic kidney complication: Secondary | ICD-10-CM | POA: Diagnosis not present

## 2017-06-24 DIAGNOSIS — D509 Iron deficiency anemia, unspecified: Secondary | ICD-10-CM | POA: Diagnosis not present

## 2017-06-24 DIAGNOSIS — Z283 Underimmunization status: Secondary | ICD-10-CM | POA: Diagnosis not present

## 2017-06-26 DIAGNOSIS — Z283 Underimmunization status: Secondary | ICD-10-CM | POA: Diagnosis not present

## 2017-06-26 DIAGNOSIS — Z23 Encounter for immunization: Secondary | ICD-10-CM | POA: Diagnosis not present

## 2017-06-26 DIAGNOSIS — N2581 Secondary hyperparathyroidism of renal origin: Secondary | ICD-10-CM | POA: Diagnosis not present

## 2017-06-26 DIAGNOSIS — E1129 Type 2 diabetes mellitus with other diabetic kidney complication: Secondary | ICD-10-CM | POA: Diagnosis not present

## 2017-06-26 DIAGNOSIS — D509 Iron deficiency anemia, unspecified: Secondary | ICD-10-CM | POA: Diagnosis not present

## 2017-06-26 DIAGNOSIS — N186 End stage renal disease: Secondary | ICD-10-CM | POA: Diagnosis not present

## 2017-06-29 DIAGNOSIS — N186 End stage renal disease: Secondary | ICD-10-CM | POA: Diagnosis not present

## 2017-06-29 DIAGNOSIS — D509 Iron deficiency anemia, unspecified: Secondary | ICD-10-CM | POA: Diagnosis not present

## 2017-06-29 DIAGNOSIS — Z23 Encounter for immunization: Secondary | ICD-10-CM | POA: Diagnosis not present

## 2017-06-29 DIAGNOSIS — Z283 Underimmunization status: Secondary | ICD-10-CM | POA: Diagnosis not present

## 2017-06-29 DIAGNOSIS — N2581 Secondary hyperparathyroidism of renal origin: Secondary | ICD-10-CM | POA: Diagnosis not present

## 2017-06-29 DIAGNOSIS — E1129 Type 2 diabetes mellitus with other diabetic kidney complication: Secondary | ICD-10-CM | POA: Diagnosis not present

## 2017-07-01 DIAGNOSIS — Z283 Underimmunization status: Secondary | ICD-10-CM | POA: Diagnosis not present

## 2017-07-01 DIAGNOSIS — Z992 Dependence on renal dialysis: Secondary | ICD-10-CM | POA: Diagnosis not present

## 2017-07-01 DIAGNOSIS — D509 Iron deficiency anemia, unspecified: Secondary | ICD-10-CM | POA: Diagnosis not present

## 2017-07-01 DIAGNOSIS — Z23 Encounter for immunization: Secondary | ICD-10-CM | POA: Diagnosis not present

## 2017-07-01 DIAGNOSIS — N2581 Secondary hyperparathyroidism of renal origin: Secondary | ICD-10-CM | POA: Diagnosis not present

## 2017-07-01 DIAGNOSIS — N186 End stage renal disease: Secondary | ICD-10-CM | POA: Diagnosis not present

## 2017-07-01 DIAGNOSIS — E1129 Type 2 diabetes mellitus with other diabetic kidney complication: Secondary | ICD-10-CM | POA: Diagnosis not present

## 2017-07-02 DIAGNOSIS — N186 End stage renal disease: Secondary | ICD-10-CM | POA: Diagnosis not present

## 2017-07-02 DIAGNOSIS — E1129 Type 2 diabetes mellitus with other diabetic kidney complication: Secondary | ICD-10-CM | POA: Diagnosis not present

## 2017-07-02 DIAGNOSIS — Z992 Dependence on renal dialysis: Secondary | ICD-10-CM | POA: Diagnosis not present

## 2017-07-03 DIAGNOSIS — N186 End stage renal disease: Secondary | ICD-10-CM | POA: Diagnosis not present

## 2017-07-03 DIAGNOSIS — D509 Iron deficiency anemia, unspecified: Secondary | ICD-10-CM | POA: Diagnosis not present

## 2017-07-03 DIAGNOSIS — E1129 Type 2 diabetes mellitus with other diabetic kidney complication: Secondary | ICD-10-CM | POA: Diagnosis not present

## 2017-07-03 DIAGNOSIS — Z992 Dependence on renal dialysis: Secondary | ICD-10-CM | POA: Diagnosis not present

## 2017-07-03 DIAGNOSIS — N2581 Secondary hyperparathyroidism of renal origin: Secondary | ICD-10-CM | POA: Diagnosis not present

## 2017-07-06 DIAGNOSIS — D509 Iron deficiency anemia, unspecified: Secondary | ICD-10-CM | POA: Diagnosis not present

## 2017-07-06 DIAGNOSIS — Z992 Dependence on renal dialysis: Secondary | ICD-10-CM | POA: Diagnosis not present

## 2017-07-06 DIAGNOSIS — E1129 Type 2 diabetes mellitus with other diabetic kidney complication: Secondary | ICD-10-CM | POA: Diagnosis not present

## 2017-07-06 DIAGNOSIS — N2581 Secondary hyperparathyroidism of renal origin: Secondary | ICD-10-CM | POA: Diagnosis not present

## 2017-07-06 DIAGNOSIS — N186 End stage renal disease: Secondary | ICD-10-CM | POA: Diagnosis not present

## 2017-07-08 DIAGNOSIS — Z992 Dependence on renal dialysis: Secondary | ICD-10-CM | POA: Diagnosis not present

## 2017-07-08 DIAGNOSIS — E1129 Type 2 diabetes mellitus with other diabetic kidney complication: Secondary | ICD-10-CM | POA: Diagnosis not present

## 2017-07-08 DIAGNOSIS — N186 End stage renal disease: Secondary | ICD-10-CM | POA: Diagnosis not present

## 2017-07-08 DIAGNOSIS — N2581 Secondary hyperparathyroidism of renal origin: Secondary | ICD-10-CM | POA: Diagnosis not present

## 2017-07-08 DIAGNOSIS — D509 Iron deficiency anemia, unspecified: Secondary | ICD-10-CM | POA: Diagnosis not present

## 2017-07-10 DIAGNOSIS — D509 Iron deficiency anemia, unspecified: Secondary | ICD-10-CM | POA: Diagnosis not present

## 2017-07-10 DIAGNOSIS — N2581 Secondary hyperparathyroidism of renal origin: Secondary | ICD-10-CM | POA: Diagnosis not present

## 2017-07-10 DIAGNOSIS — N186 End stage renal disease: Secondary | ICD-10-CM | POA: Diagnosis not present

## 2017-07-10 DIAGNOSIS — E1129 Type 2 diabetes mellitus with other diabetic kidney complication: Secondary | ICD-10-CM | POA: Diagnosis not present

## 2017-07-10 DIAGNOSIS — Z992 Dependence on renal dialysis: Secondary | ICD-10-CM | POA: Diagnosis not present

## 2017-07-13 DIAGNOSIS — Z992 Dependence on renal dialysis: Secondary | ICD-10-CM | POA: Diagnosis not present

## 2017-07-13 DIAGNOSIS — D509 Iron deficiency anemia, unspecified: Secondary | ICD-10-CM | POA: Diagnosis not present

## 2017-07-13 DIAGNOSIS — E1129 Type 2 diabetes mellitus with other diabetic kidney complication: Secondary | ICD-10-CM | POA: Diagnosis not present

## 2017-07-13 DIAGNOSIS — N186 End stage renal disease: Secondary | ICD-10-CM | POA: Diagnosis not present

## 2017-07-13 DIAGNOSIS — N2581 Secondary hyperparathyroidism of renal origin: Secondary | ICD-10-CM | POA: Diagnosis not present

## 2017-07-15 DIAGNOSIS — D509 Iron deficiency anemia, unspecified: Secondary | ICD-10-CM | POA: Diagnosis not present

## 2017-07-15 DIAGNOSIS — Z992 Dependence on renal dialysis: Secondary | ICD-10-CM | POA: Diagnosis not present

## 2017-07-15 DIAGNOSIS — E1129 Type 2 diabetes mellitus with other diabetic kidney complication: Secondary | ICD-10-CM | POA: Diagnosis not present

## 2017-07-15 DIAGNOSIS — N2581 Secondary hyperparathyroidism of renal origin: Secondary | ICD-10-CM | POA: Diagnosis not present

## 2017-07-15 DIAGNOSIS — N186 End stage renal disease: Secondary | ICD-10-CM | POA: Diagnosis not present

## 2017-07-17 DIAGNOSIS — N186 End stage renal disease: Secondary | ICD-10-CM | POA: Diagnosis not present

## 2017-07-17 DIAGNOSIS — D509 Iron deficiency anemia, unspecified: Secondary | ICD-10-CM | POA: Diagnosis not present

## 2017-07-17 DIAGNOSIS — Z992 Dependence on renal dialysis: Secondary | ICD-10-CM | POA: Diagnosis not present

## 2017-07-17 DIAGNOSIS — N2581 Secondary hyperparathyroidism of renal origin: Secondary | ICD-10-CM | POA: Diagnosis not present

## 2017-07-17 DIAGNOSIS — E1129 Type 2 diabetes mellitus with other diabetic kidney complication: Secondary | ICD-10-CM | POA: Diagnosis not present

## 2017-07-20 DIAGNOSIS — N2581 Secondary hyperparathyroidism of renal origin: Secondary | ICD-10-CM | POA: Diagnosis not present

## 2017-07-20 DIAGNOSIS — E1129 Type 2 diabetes mellitus with other diabetic kidney complication: Secondary | ICD-10-CM | POA: Diagnosis not present

## 2017-07-20 DIAGNOSIS — Z992 Dependence on renal dialysis: Secondary | ICD-10-CM | POA: Diagnosis not present

## 2017-07-20 DIAGNOSIS — D509 Iron deficiency anemia, unspecified: Secondary | ICD-10-CM | POA: Diagnosis not present

## 2017-07-20 DIAGNOSIS — N186 End stage renal disease: Secondary | ICD-10-CM | POA: Diagnosis not present

## 2017-07-22 DIAGNOSIS — Z992 Dependence on renal dialysis: Secondary | ICD-10-CM | POA: Diagnosis not present

## 2017-07-22 DIAGNOSIS — N186 End stage renal disease: Secondary | ICD-10-CM | POA: Diagnosis not present

## 2017-07-22 DIAGNOSIS — N2581 Secondary hyperparathyroidism of renal origin: Secondary | ICD-10-CM | POA: Diagnosis not present

## 2017-07-22 DIAGNOSIS — E1129 Type 2 diabetes mellitus with other diabetic kidney complication: Secondary | ICD-10-CM | POA: Diagnosis not present

## 2017-07-22 DIAGNOSIS — D509 Iron deficiency anemia, unspecified: Secondary | ICD-10-CM | POA: Diagnosis not present

## 2017-07-24 DIAGNOSIS — D509 Iron deficiency anemia, unspecified: Secondary | ICD-10-CM | POA: Diagnosis not present

## 2017-07-24 DIAGNOSIS — E1129 Type 2 diabetes mellitus with other diabetic kidney complication: Secondary | ICD-10-CM | POA: Diagnosis not present

## 2017-07-24 DIAGNOSIS — Z992 Dependence on renal dialysis: Secondary | ICD-10-CM | POA: Diagnosis not present

## 2017-07-24 DIAGNOSIS — N2581 Secondary hyperparathyroidism of renal origin: Secondary | ICD-10-CM | POA: Diagnosis not present

## 2017-07-24 DIAGNOSIS — N186 End stage renal disease: Secondary | ICD-10-CM | POA: Diagnosis not present

## 2017-07-25 ENCOUNTER — Other Ambulatory Visit: Payer: Self-pay | Admitting: Family Medicine

## 2017-07-25 DIAGNOSIS — E785 Hyperlipidemia, unspecified: Secondary | ICD-10-CM

## 2017-07-25 DIAGNOSIS — E1121 Type 2 diabetes mellitus with diabetic nephropathy: Secondary | ICD-10-CM

## 2017-07-25 DIAGNOSIS — D509 Iron deficiency anemia, unspecified: Secondary | ICD-10-CM

## 2017-07-25 DIAGNOSIS — N186 End stage renal disease: Secondary | ICD-10-CM

## 2017-07-25 DIAGNOSIS — Z992 Dependence on renal dialysis: Secondary | ICD-10-CM

## 2017-07-27 DIAGNOSIS — Z992 Dependence on renal dialysis: Secondary | ICD-10-CM | POA: Diagnosis not present

## 2017-07-27 DIAGNOSIS — N2581 Secondary hyperparathyroidism of renal origin: Secondary | ICD-10-CM | POA: Diagnosis not present

## 2017-07-27 DIAGNOSIS — E1129 Type 2 diabetes mellitus with other diabetic kidney complication: Secondary | ICD-10-CM | POA: Diagnosis not present

## 2017-07-27 DIAGNOSIS — N186 End stage renal disease: Secondary | ICD-10-CM | POA: Diagnosis not present

## 2017-07-27 DIAGNOSIS — D509 Iron deficiency anemia, unspecified: Secondary | ICD-10-CM | POA: Diagnosis not present

## 2017-07-28 ENCOUNTER — Ambulatory Visit: Payer: Medicare Other

## 2017-07-29 DIAGNOSIS — N186 End stage renal disease: Secondary | ICD-10-CM | POA: Diagnosis not present

## 2017-07-29 DIAGNOSIS — N2581 Secondary hyperparathyroidism of renal origin: Secondary | ICD-10-CM | POA: Diagnosis not present

## 2017-07-29 DIAGNOSIS — D509 Iron deficiency anemia, unspecified: Secondary | ICD-10-CM | POA: Diagnosis not present

## 2017-07-29 DIAGNOSIS — E1129 Type 2 diabetes mellitus with other diabetic kidney complication: Secondary | ICD-10-CM | POA: Diagnosis not present

## 2017-07-29 DIAGNOSIS — Z992 Dependence on renal dialysis: Secondary | ICD-10-CM | POA: Diagnosis not present

## 2017-07-30 ENCOUNTER — Encounter: Payer: Medicare Other | Admitting: Family Medicine

## 2017-07-30 ENCOUNTER — Telehealth: Payer: Self-pay | Admitting: Radiology

## 2017-07-30 ENCOUNTER — Other Ambulatory Visit: Payer: Self-pay

## 2017-07-30 ENCOUNTER — Ambulatory Visit (INDEPENDENT_AMBULATORY_CARE_PROVIDER_SITE_OTHER): Payer: Medicare Other

## 2017-07-30 ENCOUNTER — Ambulatory Visit (INDEPENDENT_AMBULATORY_CARE_PROVIDER_SITE_OTHER)
Admission: RE | Admit: 2017-07-30 | Discharge: 2017-07-30 | Disposition: A | Discharge status: Home / Self Care | Payer: Medicare Other | Source: Ambulatory Visit | Attending: Family Medicine | Admitting: Family Medicine

## 2017-07-30 VITALS — BP 82/40 | HR 73 | Temp 98.6°F | Wt 106.5 lb

## 2017-07-30 DIAGNOSIS — Z0000 Encounter for general adult medical examination without abnormal findings: Secondary | ICD-10-CM

## 2017-07-30 DIAGNOSIS — Z0001 Encounter for general adult medical examination with abnormal findings: Secondary | ICD-10-CM | POA: Diagnosis not present

## 2017-07-30 DIAGNOSIS — D509 Iron deficiency anemia, unspecified: Secondary | ICD-10-CM

## 2017-07-30 DIAGNOSIS — N186 End stage renal disease: Secondary | ICD-10-CM

## 2017-07-30 DIAGNOSIS — M7989 Other specified soft tissue disorders: Secondary | ICD-10-CM | POA: Diagnosis not present

## 2017-07-30 DIAGNOSIS — Z992 Dependence on renal dialysis: Secondary | ICD-10-CM

## 2017-07-30 DIAGNOSIS — E785 Hyperlipidemia, unspecified: Secondary | ICD-10-CM | POA: Diagnosis not present

## 2017-07-30 DIAGNOSIS — S99922A Unspecified injury of left foot, initial encounter: Secondary | ICD-10-CM

## 2017-07-30 DIAGNOSIS — E1121 Type 2 diabetes mellitus with diabetic nephropathy: Secondary | ICD-10-CM | POA: Diagnosis not present

## 2017-07-30 DIAGNOSIS — E1129 Type 2 diabetes mellitus with other diabetic kidney complication: Secondary | ICD-10-CM | POA: Diagnosis not present

## 2017-07-30 LAB — LIPID PANEL
Cholesterol: 299 mg/dL — ABNORMAL HIGH (ref 0–200)
HDL: 36.9 mg/dL — AB (ref >39.00)
LDL Cholesterol: 234 mg/dL — ABNORMAL HIGH (ref 0–99)
NONHDL: 262.34
Total CHOL/HDL Ratio: 8
Triglycerides: 140 mg/dL (ref 0.0–149.0)
VLDL: 28 mg/dL (ref 0.0–40.0)

## 2017-07-30 LAB — CBC WITH DIFFERENTIAL/PLATELET
BASOS ABS: 0 10*3/uL (ref 0.0–0.1)
Basophils Relative: 0.9 % (ref 0.0–3.0)
EOS PCT: 1.2 % (ref 0.0–5.0)
Eosinophils Absolute: 0.1 10*3/uL (ref 0.0–0.7)
HCT: 29.3 % — ABNORMAL LOW (ref 36.0–46.0)
Hemoglobin: 9.9 g/dL — ABNORMAL LOW (ref 12.0–15.0)
LYMPHS ABS: 1.3 10*3/uL (ref 0.7–4.0)
Lymphocytes Relative: 23.5 % (ref 12.0–46.0)
MCHC: 33.8 g/dL (ref 30.0–36.0)
MCV: 97.9 fl (ref 78.0–100.0)
Monocytes Absolute: 0.6 10*3/uL (ref 0.1–1.0)
Monocytes Relative: 11.3 % (ref 3.0–12.0)
NEUTROS PCT: 63.1 % (ref 43.0–77.0)
Neutro Abs: 3.4 10*3/uL (ref 1.4–7.7)
Platelets: 107 10*3/uL — ABNORMAL LOW (ref 150.0–400.0)
RBC: 2.99 Mil/uL — AB (ref 3.87–5.11)
RDW: 21.5 % — ABNORMAL HIGH (ref 11.5–15.5)
WBC: 5.3 10*3/uL (ref 4.0–10.5)

## 2017-07-30 LAB — RENAL FUNCTION PANEL
ALBUMIN: 3.3 g/dL — AB (ref 3.5–5.2)
BUN: 12 mg/dL (ref 6–23)
CALCIUM: 9.6 mg/dL (ref 8.4–10.5)
CO2: 36 mEq/L — ABNORMAL HIGH (ref 19–32)
CREATININE: 3.47 mg/dL — AB (ref 0.40–1.20)
Chloride: 99 mEq/L (ref 96–112)
GFR: 16.05 mL/min — ABNORMAL LOW (ref >60.00)
GLUCOSE: 154 mg/dL — AB (ref 70–99)
Phosphorus: 1 mg/dL — CL (ref 2.3–4.6)
Potassium: 3.5 mEq/L (ref 3.5–5.1)
Sodium: 142 mEq/L (ref 135–145)

## 2017-07-30 LAB — HEMOGLOBIN A1C: HEMOGLOBIN A1C: 5.3 % (ref 4.6–6.5)

## 2017-07-30 LAB — VITAMIN D 25 HYDROXY (VIT D DEFICIENCY, FRACTURES): VITD: 24.19 ng/mL — AB (ref 30.00–100.00)

## 2017-07-30 MED ORDER — MEMANTINE HCL 5 MG PO TABS: ORAL_TABLET | ORAL | 1 refills | Status: AC

## 2017-07-30 NOTE — Progress Notes (Signed)
PCP notes:   Health maintenance:  Tetanus vaccine - postponed/insurance Eye exam - per family report, exam in Jan 2019 A1C - completed  Abnormal screenings:   Fall risk - hx of multiple falls Fall Risk  07/30/2017 07/27/2016 11/10/2013 05/12/2013 01/09/2013  Falls in the past year? Yes No No No Yes  Comment daughter reports pt falls 1-2 times per week - - - -  Number falls in past yr: 2 or more - - - -  Injury with Fall? Yes - - - -  Risk Factor Category  High Fall Risk - - - -  Risk for fall due to : History of fall(s);Impaired balance/gait;Impaired mobility;Mental status change Impaired mobility Impaired balance/gait;Impaired mobility;Mental status change - Impaired balance/gait;Mental status change;Impaired mobility  Follow up (No Data) - - - -  Comment provided info on [place] medical alert services - - - -   Family/patient concerns:   Pain management: Per daughter, pt has complained about bilateral foot pain and has a few bruises to her toes. Upon assessment, noted a bruise to second toe on right foot and a small guaze dressing to second toe on left foot. Patient verbalized pain when left foot was touched. PCP notified. PCP assessed patient's feet and ordered X-ray of left foot.  Behavior management: Per daughter, pt has increased agitation and insomnia after dialysis. Behaviors include cursing and threatening physical harm. Daughter denies patient has become physically harmful. Daughter would like to discuss with PCP a medication that will calm patient after dialysis.  Weight management: Per daughter, pt is having unintentional weight loss due to a loss of appetite. Patient does use a supplement called "Nepro". Per daughter, patient has dentures but states they don't feel the same. Nurse discussed with daughter about choosing foods that are softer in texture like potatoes and applesauce in the event the patient may be having swallowing concerns.   Medication refill: Per daughter,  pt needs a refill of Memantine 5 mg. Refill request sent to PCP.    Nurse concerns:  Patient's BP was abnormal. BP - 82/40. PCP notified. Per PCP, daughter was advised to hold BP medications today and to take BP prior to taking next dose of BP medication. If BP remains abnormal, daughter needs to contact PCP. Daughter verbalized understanding.   Next PCP appt:   08/09/17 @ 1030

## 2017-07-30 NOTE — Progress Notes (Signed)
Subjective:   [name] is a 84 y.o. female who presents for Medicare Annual (Subsequent) preventive examination.  Review of Systems: N/A Cardiac Risk Factors include: advanced age (>54men, >66 women);diabetes mellitus;dyslipidemia;hypertension     Objective:     Vitals: BP (!) 82/40 (BP Location: Left Arm, Patient Position: Sitting, Cuff Size: Normal)   Pulse 73   Temp 98.6 F (37 C) (Oral)   Wt 106 lb 8 oz (48.3 kg)   SpO2 99%   BMI 20.12 kg/m   Body mass index is 20.12 kg/m.  Advanced Directives 07/30/2017 03/20/2017 06/21/2015 05/14/2015 04/09/2015 03/21/2015 03/06/2015  Does Patient Have a Medical Advance Directive? Yes No No No No;Yes No No  Type of Advance Directive Healthcare Power of Attorney - - - Living will - -  Does patient want to make changes to medical advance directive? - - - - - - -  Copy of Healthcare Power of Attorney in Chart? No - copy requested - - - No - copy requested - -  Would patient like information on creating a medical advance directive? - - - No - patient declined information - - No - patient declined information  Pre-existing out of facility DNR order (yellow form or pink MOST form) - - - - - - -    Tobacco Social History   Tobacco Use  Smoking Status Never Smoker  Smokeless Tobacco Never Used     Counseling given: No   Clinical Intake:  Pre-visit preparation completed: Yes  Pain : 0-10 Pain Score: 5  Pain Type: Acute pain Pain Location: Foot Pain Orientation: Left, Right Pain Onset: 1 to 4 weeks ago Pain Frequency: Constant     Nutritional Status: BMI of 19-24  Normal Nutritional Risks: Failure to thrive, Unintentional weight loss(daughter reports loss of appetite; uses Nepro supplement) Diabetes: Yes CBG done?: No Did pt. bring in CBG monitor from home?: No  How often do you need to have someone help you when you read instructions, pamphlets, or other written materials from your doctor or pharmacy?: 5 - Always What is  the last grade level you completed in school?: 12th grade + some college courses  Interpreter Needed?: No  Comments: pt lives with spouse Information entered by :: LPinson, LPN  Past Medical History:  Diagnosis Date  . Anxiety   . Bronchitis   . C. difficile diarrhea 02/01/2015  . Cough   . Depression    lexapro  . Diabetes mellitus    type 2  . Dyslipidemia    remote hx/notes 10/06/2009  . ESRD (end stage renal disease) on dialysis [facility])    Dr Coladonato/[place]  . GERD (gastroesophageal reflux disease)   . Hypertension   . 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 Osteoarthritis   . Staphylococcus aureus bacteremia with sepsis (HCC)    thought from HD catheter  . Thrombocytopenia (HCC) 11/16/2014  . Traumatic hematoma of left forearm 05/25/2015  . UTI (urinary tract infection) 05/2015   Past Surgical History:  Procedure Laterality Date  . ABDOMINAL HYSTERECTOMY     remote hx/notes 10/06/2009  . ARTERIOVENOUS GRAFT PLACEMENT Left 07/2004   forearm/notes 10/13/2010  . ARTERIOVENOUS GRAFT PLACEMENT Left 03/2006   upper armnotes 10/13/2010  . AVGG REMOVAL Right 04/10/2015   Procedure: REMOVAL OF RIGHT ARM  ARTERIOVENOUS GORETEX GRAFT (AVGG);  Surgeon: Fransisco Hertz, MD;  Location: [facility] OR;  Service: Vascular;  Laterality: Right;  . EYE SURGERY     Cataract extraction  .  INSERTION OF DIALYSIS CATHETER Left 04/10/2015   Procedure: INSERTION OF DIALYSIS CATHETER LEFT GROIN;  Surgeon: Fransisco Hertz, MD;  Location: [facility] OR;  Service: Vascular;  Laterality: Left;  . PERIPHERAL VASCULAR CATHETERIZATION Right 02/18/2015   Procedure: A/V Shuntogram/Fistulagram;  Surgeon: Annice Needy, MD;  Location: [facility] INVASIVE CV LAB;  Service: Cardiovascular;  Laterality: Right;  . PERIPHERAL VASCULAR CATHETERIZATION N/A 02/18/2015   Procedure: A/V Shunt Intervention;  Surgeon: Annice Needy, MD;  Location: [facility] INVASIVE CV LAB;  Service: Cardiovascular;  Laterality: N/A;  . PERIPHERAL VASCULAR  CATHETERIZATION N/A 03/06/2015   Procedure: Dialysis/Perma Catheter Insertion;  Surgeon: Annice Needy, MD;  Location: [facility] INVASIVE CV LAB;  Service: Cardiovascular;  Laterality: N/A;  . REMOVAL OF A DIALYSIS CATHETER Right 04/10/2015   Procedure: REMOVAL OF RIGHT GROIN DIALYSIS CATHETER;  Surgeon: Fransisco Hertz, MD;  Location: [facility] OR;  Service: Vascular;  Laterality: Right;  . REVISION OF ARTERIOVENOUS GORETEX GRAFT Right 03/08/2015   Procedure: REVISION OF ARTERIOVENOUS GORETEX GRAFT;  Surgeon: Renford Dills, MD;  Location: [facility] ORS;  Service: Vascular;  Laterality: Right;  . THROMBECTOMY AND REVISION OF ARTERIOVENTOUS (AV) GORETEX  GRAFT Left 12/2004; 01/2006; 03/12/2009   forearmnotes 10/13/2010; forearmnotes 10/13/2010; upper arm/notes 03/19/2009  . VITRECTOMY Left 03/2001   with membrane peel/notes 10/13/2010   Family History  Problem Relation Age of Onset  . Hypertension Mother    Social History   Socioeconomic History  . Marital status: Married    Spouse name: None  . Number of children: None  . Years of education: None  . Highest education level: None  Social Needs  . Financial resource strain: None  . Food insecurity - worry: None  . Food insecurity - inability: None  . Transportation needs - medical: None  . Transportation needs - non-medical: None  Occupational History  . None  Tobacco Use  . Smoking status: Never Smoker  . Smokeless tobacco: Never Used  Substance and Sexual Activity  . Alcohol use: No    Alcohol/week: 0.0 oz  . Drug use: No  . Sexual activity: No  Other Topics Concern  . None  Social History Narrative   Lives with husband and daughter [name])   Occupation: retired, was Marine scientist   Activity: in wheelchair, no regular exercise   Diet: drinks water and gatorade, good fruits/vegetables, avoids salt in diet    Outpatient Encounter Medications as of 07/30/2017  Medication Sig  . acetaminophen (TYLENOL) 500 MG tablet Take  500 mg by mouth 2 (two) times daily as needed for mild pain.   [name] amLODipine (NORVASC) 5 MG tablet TAKE ONE-HALF TABLET BY MOUTH ON SUNDAY, MONDAY, WEDNESDAY, AND FRIDAY. TAKE ONE TABLET BY MOUTH ALL OTHER DAYS OF THE WEEK (Patient taking differently: TAKE 5 mg TABLET BY MOUTH ON SUNDAY, MONDAY, WEDNESDAY, AND FRIDAY.)  . aspirin 81 MG tablet Take 81 mg by mouth daily.  . carvedilol (COREG) 3.125 MG tablet TAKE 1 TABLET BY MOUTH TWICE DAILY WITH MEALS  . cinacalcet (SENSIPAR) 30 MG tablet Take 30 mg by mouth daily.  . ciprofloxacin (CIPRO) 250 MG tablet Take 1 tablet (250 mg total) by mouth 2 (two) times daily.  . Dorzolamide HCl-Timolol Mal (COSOPT OP) Place 1 drop into both eyes 2 (two) times daily.   [name] escitalopram (LEXAPRO) 10 MG tablet TAKE ONE-HALF TABLET BY MOUTH ONCE DAILY (Patient taking differently: TAKE 5 mg TABLET BY MOUTH ONCE DAILY)  . famotidine (PEPCID) 20 MG tablet  Take 1 tablet (20 mg total) by mouth daily.  . folic acid-vitamin b complex-vitamin c-selenium-zinc (DIALYVITE) 3 MG TABS Take 1 tablet by mouth daily.    . memantine (NAMENDA) 5 MG tablet Take one tablet twice daily. Only take 2 tablets (10mg ) in evening on dialysis days (Patient taking differently: Take 5 mg by mouth See admin instructions. Take 5 mg  in the morning  And 5 mg in the evening on all the other days. Only take 2 tablets (10mg ) tues.  thurs. and sat. daily)  . multivitamin (RENA-VIT) TABS tablet TAKE ONE TABLET BY MOUTH AT BEDTIME  . ondansetron (ZOFRAN ODT) 4 MG disintegrating tablet Take 1 tablet (4 mg total) by mouth every 8 (eight) hours as needed for nausea.  . sevelamer carbonate (RENVELA) 0.8 g PACK packet Take 0.8 g by mouth 3 (three) times daily with meals.  . sevelamer carbonate (RENVELA) 800 MG tablet Take 2 tablets (1,600 mg total) by mouth 3 (three) times daily with meals.   No facility-administered encounter medications on file as of 07/30/2017.     Activities of Daily Living In your present  state of health, do you have any difficulty performing the following activities: 07/30/2017  Hearing? Y  Vision? N  Difficulty concentrating or making decisions? Y  Walking or climbing stairs? Y  Dressing or bathing? Y  Doing errands, shopping? Y  Preparing Food and eating ? Y  Using the Toilet? Y  In the past six months, have you accidently leaked urine? N  Do you have problems with loss of bowel control? Y  Managing your Medications? Y  Managing your Finances? Y  Housekeeping or managing your Housekeeping? Y  Some recent data might be hidden    Patient Care Team: Eustaquio BoydenGutierrez, Javier, MD as PCP - General (Family Medicine) Dorisann FramesBalan, Bindubal, MD as Consulting Physician (Endocrinology)    Assessment:   This is a routine wellness examination for [name].   Hearing Screening Comments: Hearing aid (left ear) - does not wear Vision Screening Comments: Eye exam with Dr. Luciana Axeankin in Jan 2019   Exercise Activities and Dietary recommendations Current Exercise Habits: The patient does not participate in regular exercise at present, Exercise limited by: orthopedic condition(s)  Goals    . Follow up with Primary Care Provider     Starting 07/30/2017, I will continue to take medications as prescribed and to keep appointments with PCP as scheduled.        Fall Risk Fall Risk  07/30/2017 07/27/2016 11/10/2013 05/12/2013 01/09/2013  Falls in the past year? Yes No No No Yes  Comment daughter reports pt falls 1-2 times per week - - - -  Number falls in past yr: 2 or more - - - -  Injury with Fall? Yes - - - -  Risk Factor Category  High Fall Risk - - - -  Risk for fall due to : History of fall(s);Impaired balance/gait;Impaired mobility;Mental status change Impaired mobility Impaired balance/gait;Impaired mobility;Mental status change - Impaired balance/gait;Mental status change;Impaired mobility  Follow up (No Data) - - - -  Comment provided info on [place] medical alert services - - - -    Depression Screen PHQ 2/9 Scores 07/30/2017 07/27/2016 11/10/2013 05/12/2013  PHQ - 2 Score 0 0 0 0  PHQ- 9 Score 0 - - -     Cognitive Function MMSE - Mini Mental State Exam 07/30/2017  Not completed: Unable to complete    Note: A Mini-Cog was not completed. Patient has dx  of dementia.     Immunization History  Administered Date(s) Administered  . DT 05/12/2013  . Influenza Whole 04/02/2003, 03/14/2008, 03/01/2009, 04/01/2010  . Influenza, Seasonal, Injecte, Preservative Fre 06/01/2016  . Influenza,inj,Quad PF,6+ Mos 03/22/2017  . Influenza-Unspecified 03/02/2015  . Pneumococcal Conjugate-13 05/12/2013  . Pneumococcal Polysaccharide-23 06/01/2014   Screening Tests Health Maintenance  Topic Date Due  . DTaP/Tdap/Td (1 - Tdap) 07/31/2018 (Originally 07/15/1947)  . TETANUS/TDAP  07/31/2018 (Originally 07/15/1947)  . DEXA SCAN  07/30/2048 (Originally 07/16/1995)  . FOOT EXAM  10/09/2017  . HEMOGLOBIN A1C  01/30/2018  . OPHTHALMOLOGY EXAM  06/01/2018  . INFLUENZA VACCINE  Completed  . PNA vac Low Risk Adult  Completed      Plan:     I have personally reviewed, addressed, and noted the following in the patient's chart:  A. Medical and social history B. Use of alcohol, tobacco or illicit drugs  C. Current medications and supplements D. Functional ability and status E.  Nutritional status F.  Physical activity G. Advance directives H. List of other physicians I.  Hospitalizations, surgeries, and ER visits in previous 12 months J.  Vitals K. Screenings to include hearing, vision, cognitive, depression L. Referrals and appointments - none  In addition, I have reviewed and discussed with patient certain preventive protocols, quality metrics, and best practice recommendations. A written personalized care plan for preventive services as well as general preventive health recommendations were provided to patient.  See attached scanned questionnaire for additional information.    Signed,   Randa Evens, MHA, BS, LPN Health Coach

## 2017-07-30 NOTE — Progress Notes (Signed)
I was asked to evaluate pt's L foot pain worsening over the last several days. She has had 2 falls in the last few weeks, unsure if injury to foot. Daughter just noticed due to increased pain endorsed by patient last few days.   On exam -  Pt afebrile, nontoxic, but exquisitely tender along 3rd L toe into MTPJ with darkening of skin at 3rd toe without erythema or open wound. Medial side of 3rd toe is hardened and dark area of skin. Diminished pulses noted.   L foot xray ordered after evaluating patient - no obvious fracture.  Anticipate aruising vs other injury after fall, r/o necrosis. I asked daughter to delineate darkened area and monitor. She desires to avoid ER if able. If worsening pain, new fever, nausea, malaise, rec ER evaluation over weekend. She has already scheduled appointment with foot doctor on Monday - encouraged keep that appointment. BP noted low - advised to hold antihypertensives at this time, increase water intake over weekend by 1-2 glasses/day.

## 2017-07-30 NOTE — Patient Instructions (Signed)
[name] , Thank you for taking time to come for your Medicare Wellness Visit. I appreciate your ongoing commitment to your health goals. Please review the following plan we discussed and let me know if I can assist you in the future.   These are the goals we discussed: Goals    . Follow up with Primary Care Provider     Starting 07/30/2017, I will continue to take medications as prescribed and to keep appointments with PCP as scheduled.        This is a list of the screening recommended for you and due dates:  Health Maintenance  Topic Date Due  . DTaP/Tdap/Td vaccine (1 - Tdap) 07/31/2018*  . Tetanus Vaccine  07/31/2018*  . DEXA scan (bone density measurement)  07/30/2048*  . Complete foot exam   10/09/2017  . Hemoglobin A1C  01/30/2018  . Eye exam for diabetics  06/01/2018  . Flu Shot  Completed  . Pneumonia vaccines  Completed  *Topic was postponed. The date shown is not the original due date.   Preventive Care for Adults  A healthy lifestyle and preventive care can promote health and wellness. Preventive health guidelines for adults include the following key practices.  . A routine yearly physical is a good way to check with your health care provider about your health and preventive screening. It is a chance to share any concerns and updates on your health and to receive a thorough exam.  . Visit your dentist for a routine exam and preventive care every 6 months. Brush your teeth twice a day and floss once a day. Good oral hygiene prevents tooth decay and gum disease.  . The frequency of eye exams is based on your age, health, family medical history, use  of contact lenses, and other factors. Follow your health care provider's recommendations for frequency of eye exams.  . Eat a healthy diet. Foods like vegetables, fruits, whole grains, low-fat dairy products, and lean protein foods contain the nutrients you need without too many calories. Decrease your intake of foods high  in solid fats, added sugars, and salt. Eat the right amount of calories for you. Get information about a proper diet from your health care provider, if necessary.  . Regular physical exercise is one of the most important things you can do for your health. Most adults should get at least 150 minutes of moderate-intensity exercise (any activity that increases your heart rate and causes you to sweat) each week. In addition, most adults need muscle-strengthening exercises on 2 or more days a week.  Silver Sneakers may be a benefit available to you. To determine eligibility, you may visit the website: www.silversneakers.com or contact program at [phone] Mon-Fri between 8AM-8PM.   . Maintain a healthy weight. The body mass index (BMI) is a screening tool to identify possible weight problems. It provides an estimate of body fat based on height and weight. Your health care provider can find your BMI and can help you achieve or maintain a healthy weight.   For adults 20 years and older: ? A BMI below 18.5 is considered underweight. ? A BMI of 18.5 to 24.9 is normal. ? A BMI of 25 to 29.9 is considered overweight. ? A BMI of 30 and above is considered obese.   . Maintain normal blood lipids and cholesterol levels by exercising and minimizing your intake of saturated fat. Eat a balanced diet with plenty of fruit and vegetables. Blood tests for lipids and cholesterol should  begin at age 10 and be repeated every 5 years. If your lipid or cholesterol levels are high, you are over 50, or you are at high risk for heart disease, you may need your cholesterol levels checked more frequently. Ongoing high lipid and cholesterol levels should be treated with medicines if diet and exercise are not working.  . If you smoke, find out from your health care provider how to quit. If you do not use tobacco, please do not start.  . If you choose to drink alcohol, please do not consume more than 2 drinks per day. One  drink is considered to be 12 ounces (355 mL) of beer, 5 ounces (148 mL) of wine, or 1.5 ounces (44 mL) of liquor.  . If you are 80-64 years old, ask your health care provider if you should take aspirin to prevent strokes.  . Use sunscreen. Apply sunscreen liberally and repeatedly throughout the day. You should seek shade when your shadow is shorter than you. Protect yourself by wearing long sleeves, pants, a wide-brimmed hat, and sunglasses year round, whenever you are outdoors.  . Once a month, do a whole body skin exam, using a mirror to look at the skin on your back. Tell your health care provider of new moles, moles that have irregular borders, moles that are larger than a pencil eraser, or moles that have changed in shape or color.

## 2017-07-30 NOTE — Telephone Encounter (Signed)
[facility] called a critical Phosphorus - 1.0, results given to Dr Sharen HonesGutierrez

## 2017-07-30 NOTE — Telephone Encounter (Signed)
Daughter has requested a refill of Namenda 5 mg. Please review medication note regarding how patient is currently taking medication.   Pharmacy - [facility] @ [address], [place], [place]

## 2017-07-31 DIAGNOSIS — N2581 Secondary hyperparathyroidism of renal origin: Secondary | ICD-10-CM | POA: Diagnosis not present

## 2017-07-31 DIAGNOSIS — D509 Iron deficiency anemia, unspecified: Secondary | ICD-10-CM | POA: Diagnosis not present

## 2017-07-31 DIAGNOSIS — N186 End stage renal disease: Secondary | ICD-10-CM | POA: Diagnosis not present

## 2017-07-31 DIAGNOSIS — E1129 Type 2 diabetes mellitus with other diabetic kidney complication: Secondary | ICD-10-CM | POA: Diagnosis not present

## 2017-07-31 DIAGNOSIS — Z992 Dependence on renal dialysis: Secondary | ICD-10-CM | POA: Diagnosis not present

## 2017-08-01 NOTE — Progress Notes (Signed)
I reviewed health advisor's note, was available for consultation, and agree with documentation and plan.  

## 2017-08-02 DIAGNOSIS — L97511 Non-pressure chronic ulcer of other part of right foot limited to breakdown of skin: Secondary | ICD-10-CM | POA: Diagnosis not present

## 2017-08-02 DIAGNOSIS — S91109A Unspecified open wound of unspecified toe(s) without damage to nail, initial encounter: Secondary | ICD-10-CM | POA: Diagnosis not present

## 2017-08-03 DIAGNOSIS — D509 Iron deficiency anemia, unspecified: Secondary | ICD-10-CM | POA: Diagnosis not present

## 2017-08-03 DIAGNOSIS — N186 End stage renal disease: Secondary | ICD-10-CM | POA: Diagnosis not present

## 2017-08-03 DIAGNOSIS — N2581 Secondary hyperparathyroidism of renal origin: Secondary | ICD-10-CM | POA: Diagnosis not present

## 2017-08-03 DIAGNOSIS — Z992 Dependence on renal dialysis: Secondary | ICD-10-CM | POA: Diagnosis not present

## 2017-08-03 DIAGNOSIS — E1129 Type 2 diabetes mellitus with other diabetic kidney complication: Secondary | ICD-10-CM | POA: Diagnosis not present

## 2017-08-05 DIAGNOSIS — D509 Iron deficiency anemia, unspecified: Secondary | ICD-10-CM | POA: Diagnosis not present

## 2017-08-05 DIAGNOSIS — E1129 Type 2 diabetes mellitus with other diabetic kidney complication: Secondary | ICD-10-CM | POA: Diagnosis not present

## 2017-08-05 DIAGNOSIS — N2581 Secondary hyperparathyroidism of renal origin: Secondary | ICD-10-CM | POA: Diagnosis not present

## 2017-08-05 DIAGNOSIS — Z992 Dependence on renal dialysis: Secondary | ICD-10-CM | POA: Diagnosis not present

## 2017-08-05 DIAGNOSIS — N186 End stage renal disease: Secondary | ICD-10-CM | POA: Diagnosis not present

## 2017-08-07 DIAGNOSIS — N186 End stage renal disease: Secondary | ICD-10-CM | POA: Diagnosis not present

## 2017-08-07 DIAGNOSIS — E1129 Type 2 diabetes mellitus with other diabetic kidney complication: Secondary | ICD-10-CM | POA: Diagnosis not present

## 2017-08-07 DIAGNOSIS — N2581 Secondary hyperparathyroidism of renal origin: Secondary | ICD-10-CM | POA: Diagnosis not present

## 2017-08-07 DIAGNOSIS — D509 Iron deficiency anemia, unspecified: Secondary | ICD-10-CM | POA: Diagnosis not present

## 2017-08-07 DIAGNOSIS — Z992 Dependence on renal dialysis: Secondary | ICD-10-CM | POA: Diagnosis not present

## 2017-08-09 ENCOUNTER — Encounter: Payer: Self-pay | Admitting: Family Medicine

## 2017-08-09 ENCOUNTER — Ambulatory Visit (INDEPENDENT_AMBULATORY_CARE_PROVIDER_SITE_OTHER): Payer: Medicare Other | Admitting: Family Medicine

## 2017-08-09 VITALS — BP 116/82 | HR 75 | Temp 98.6°F | Ht 63.0 in | Wt 105.0 lb

## 2017-08-09 DIAGNOSIS — M79672 Pain in left foot: Secondary | ICD-10-CM

## 2017-08-09 DIAGNOSIS — D696 Thrombocytopenia, unspecified: Secondary | ICD-10-CM

## 2017-08-09 DIAGNOSIS — F0390 Unspecified dementia without behavioral disturbance: Secondary | ICD-10-CM | POA: Diagnosis not present

## 2017-08-09 DIAGNOSIS — Z992 Dependence on renal dialysis: Secondary | ICD-10-CM

## 2017-08-09 DIAGNOSIS — E785 Hyperlipidemia, unspecified: Secondary | ICD-10-CM

## 2017-08-09 DIAGNOSIS — L97529 Non-pressure chronic ulcer of other part of left foot with unspecified severity: Secondary | ICD-10-CM | POA: Insufficient documentation

## 2017-08-09 DIAGNOSIS — E1121 Type 2 diabetes mellitus with diabetic nephropathy: Secondary | ICD-10-CM

## 2017-08-09 DIAGNOSIS — I10 Essential (primary) hypertension: Secondary | ICD-10-CM

## 2017-08-09 DIAGNOSIS — N186 End stage renal disease: Secondary | ICD-10-CM | POA: Diagnosis not present

## 2017-08-09 DIAGNOSIS — D509 Iron deficiency anemia, unspecified: Secondary | ICD-10-CM | POA: Diagnosis not present

## 2017-08-09 DIAGNOSIS — F324 Major depressive disorder, single episode, in partial remission: Secondary | ICD-10-CM

## 2017-08-09 NOTE — Assessment & Plan Note (Signed)
Anuric. HD TThSat.

## 2017-08-09 NOTE — Patient Instructions (Addendum)
See Shirlee LimerickMarion on your way out to schedule arterial circulation evaluation.  Sign release form for podiatry records [facility]).  Continue doxycycline for now.  Copy of blood work to take to kidney doctors.

## 2017-08-09 NOTE — Assessment & Plan Note (Signed)
Chronic, stable 

## 2017-08-09 NOTE — Progress Notes (Signed)
BP 116/82 (BP Location: Left Arm, Patient Position: Sitting, Cuff Size: Normal)   Pulse 75   Temp 98.6 F (37 C) (Oral)   Ht 5\' 3"  (1.6 m)   Wt 105 lb (47.6 kg)   SpO2 98%   BMI 18.60 kg/m    CC: CPE Subjective:    Patient ID: [name], female    DOB: 1929/01/24, 84 y.o.   MRN: [id number]  HPI: [name] is a 84 y.o. female presenting on 08/09/2017 for Annual Exam (Pt 2.  Pt accompanied by daughter-in-law, Junious Dresser. )   Her with DIL Junious Dresser.  Saw Lesia 2 weeks ago for medicare wellness visit. Note reviewed. Foot pain/bruising - saw podiatrist on Monday -  planned dopplers but they haven't been contacted to schedule this. Started on doxycycline.   Poor diet - only eats sausage and buttermilk biscuit daily.   ESRD on HD TThSat.  Memory stable. Ongoing sundowning.  Does not make urine.  Stools ok - 1 bowel movement daily, wears depends.   Preventative: Colon cancer screening - aged out Lung cancer screening - not indicated Breast cancer screening - aged out Well woman exam - aged out DEXA scan - unsure Flu shot yearly Tetanus shot ~2014 Pneumovax 2016, prevnar 2014 Shingles shot - not at this time. Advanced directive discussion - discussed. Does not have set up. Would want daughter Dorice Lamas and husband to be HCPOA. Handout provided today. Seat belt use discussed. No changing moles on skin. Non smoker Alcohol - none  Lives with husband and daughter Occupation: retired, was Marine scientist Activity: in wheelchair, no regular exercise Diet: drinks water and gatorade, good fruits/vegetables, avoids salt in diet  Relevant past medical, surgical, family and social history reviewed and updated as indicated. Interim medical history since our last visit reviewed. Allergies and medications reviewed and updated. Outpatient Medications Prior to Visit  Medication Sig Dispense Refill  . acetaminophen (TYLENOL) 500 MG tablet Take 500 mg by mouth 2 (two) times  daily as needed for mild pain.     [name] amLODipine (NORVASC) 5 MG tablet TAKE ONE-HALF TABLET BY MOUTH ON SUNDAY, MONDAY, WEDNESDAY, AND FRIDAY. TAKE ONE TABLET BY MOUTH ALL OTHER DAYS OF THE WEEK (Patient taking differently: TAKE 5 mg TABLET BY MOUTH ON SUNDAY, MONDAY, WEDNESDAY, AND FRIDAY.) 90 tablet 1  . aspirin 81 MG tablet Take 81 mg by mouth daily.    . carvedilol (COREG) 3.125 MG tablet TAKE 1 TABLET BY MOUTH TWICE DAILY WITH MEALS 180 tablet 0  . cinacalcet (SENSIPAR) 30 MG tablet Take 30 mg by mouth daily.    . Dorzolamide HCl-Timolol Mal (COSOPT OP) Place 1 drop into both eyes 2 (two) times daily.     [name] escitalopram (LEXAPRO) 10 MG tablet TAKE ONE-HALF TABLET BY MOUTH ONCE DAILY (Patient taking differently: TAKE 5 mg TABLET BY MOUTH ONCE DAILY) 45 tablet 3  . famotidine (PEPCID) 20 MG tablet Take 1 tablet (20 mg total) by mouth daily.    . folic acid-vitamin b complex-vitamin c-selenium-zinc (DIALYVITE) 3 MG TABS Take 1 tablet by mouth daily.      . memantine (NAMENDA) 5 MG tablet Take one tablet twice daily. Only take 2 tablets (10mg ) in evening on dialysis days 180 tablet 1  . multivitamin (RENA-VIT) TABS tablet TAKE ONE TABLET BY MOUTH AT BEDTIME 90 tablet 3  . ondansetron (ZOFRAN ODT) 4 MG disintegrating tablet Take 1 tablet (4 mg total) by mouth every 8 (eight) hours as needed for  nausea. 6 tablet 0  . sevelamer carbonate (RENVELA) 0.8 g PACK packet Take 0.8 g by mouth 3 (three) times daily with meals.    . sevelamer carbonate (RENVELA) 800 MG tablet Take 2 tablets (1,600 mg total) by mouth 3 (three) times daily with meals. 180 tablet 1  . ciprofloxacin (CIPRO) 250 MG tablet Take 1 tablet (250 mg total) by mouth 2 (two) times daily. 10 tablet 0  . doxycycline (VIBRA-TABS) 100 MG tablet Take 1 tablet (100 mg total) by mouth 2 (two) times daily.  0   No facility-administered medications prior to visit.      Per HPI unless specifically indicated in ROS section below Review of  Systems     Objective:    BP 116/82 (BP Location: Left Arm, Patient Position: Sitting, Cuff Size: Normal)   Pulse 75   Temp 98.6 F (37 C) (Oral)   Ht 5\' 3"  (1.6 m)   Wt 105 lb (47.6 kg)   SpO2 98%   BMI 18.60 kg/m   Wt Readings from Last 3 Encounters:  08/09/17 105 lb (47.6 kg)  07/30/17 106 lb 8 oz (48.3 kg)  03/22/17 118 lb (53.5 kg)    Physical Exam  Constitutional: She is oriented to person, place, and time. She appears well-developed and well-nourished. No distress.  Seated in wheelchair with gloves on hands  HENT:  Head: Normocephalic and atraumatic.  Right Ear: Tympanic membrane, external ear and ear canal normal. Decreased hearing is noted.  Left Ear: Tympanic membrane, external ear and ear canal normal. Decreased hearing is noted.  Nose: Nose normal.  Mouth/Throat: Uvula is midline, oropharynx is clear and moist and mucous membranes are normal. No oropharyngeal exudate, posterior oropharyngeal edema or posterior oropharyngeal erythema.  Eyes: Conjunctivae and EOM are normal. Pupils are equal, round, and reactive to light. No scleral icterus.  Neck: Normal range of motion. Neck supple.  Cardiovascular: Normal rate, regular rhythm and intact distal pulses.  Murmur heard. Pulses:      Radial pulses are 2+ on the right side, and 2+ on the left side.  Pulmonary/Chest: Effort normal and breath sounds normal. No respiratory distress. She has no wheezes. She has no rales.  Abdominal: Soft. Bowel sounds are normal. She exhibits no distension and no mass. There is no tenderness. There is no rebound and no guarding.  Musculoskeletal: Normal range of motion. She exhibits no edema.  Poor pulses L foot - 3rd medial toe with progression of darkened hard skin compared to prior eval Bruising/hyperpigmentation of 3rd toe into dorsal foot, unchanged  Lymphadenopathy:    She has no cervical adenopathy.  Neurological: She is alert and oriented to person, place, and time.  CN grossly  intact, station and gait intact  Skin: Skin is warm and dry. No rash noted.  Psychiatric: She has a normal mood and affect. Her behavior is normal. Judgment and thought content normal.  Nursing note and vitals reviewed.  Results for orders placed or performed in visit on 07/30/17  VITAMIN D 25 Hydroxy (Vit-D Deficiency, Fractures)  Result Value Ref Range   VITD 24.19 (L) 30.00 - 100.00 ng/mL  Renal function panel  Result Value Ref Range   Sodium 142 135 - 145 mEq/L   Potassium 3.5 3.5 - 5.1 mEq/L   Chloride 99 96 - 112 mEq/L   CO2 36 (H) 19 - 32 mEq/L   Calcium 9.6 8.4 - 10.5 mg/dL   Albumin 3.3 (L) 3.5 - 5.2 g/dL   BUN 12  6 - 23 mg/dL   Creatinine, Ser 1.61 (H) 0.40 - 1.20 mg/dL   Glucose, Bld 096 (H) 70 - 99 mg/dL   Phosphorus 1.0 (LL) 2.3 - 4.6 mg/dL   GFR 04.54 (L) >09.81 mL/min  CBC with Differential/Platelet  Result Value Ref Range   WBC 5.3 4.0 - 10.5 K/uL   RBC 2.99 (L) 3.87 - 5.11 Mil/uL   Hemoglobin 9.9 (L) 12.0 - 15.0 g/dL   HCT 19.1 (L) 47.8 - 29.5 %   MCV 97.9 78.0 - 100.0 fl   MCHC 33.8 30.0 - 36.0 g/dL   RDW 62.1 (H) 30.8 - 65.7 %   Platelets 107.0 (L) 150.0 - 400.0 K/uL   Neutrophils Relative % 63.1 43.0 - 77.0 %   Lymphocytes Relative 23.5 12.0 - 46.0 %   Monocytes Relative 11.3 3.0 - 12.0 %   Eosinophils Relative 1.2 0.0 - 5.0 %   Basophils Relative 0.9 0.0 - 3.0 %   Neutro Abs 3.4 1.4 - 7.7 K/uL   Lymphs Abs 1.3 0.7 - 4.0 K/uL   Monocytes Absolute 0.6 0.1 - 1.0 K/uL   Eosinophils Absolute 0.1 0.0 - 0.7 K/uL   Basophils Absolute 0.0 0.0 - 0.1 K/uL  Hemoglobin A1c  Result Value Ref Range   Hgb A1c MFr Bld 5.3 4.6 - 6.5 %  Lipid panel  Result Value Ref Range   Cholesterol 299 (H) 0 - 200 mg/dL   Triglycerides 846.9 0.0 - 149.0 mg/dL   HDL 62.95 (L) >28.41 mg/dL   VLDL 32.4 0.0 - 40.1 mg/dL   LDL Cholesterol 027 (H) 0 - 99 mg/dL   Total CHOL/HDL Ratio 8    NonHDL 262.34       Assessment & Plan:   Problem List Items Addressed This Visit     ANEMIA, IRON DEFICIENCY, CHRONIC    Continue to monitor.       Controlled type 2 diabetes mellitus with diabetic nephropathy, without long-term current use of insulin (HCC)    cbg elevated recently, low (likely falsely low due to anemia, will need fructosamine next labs). Poor diet contributes.       Dementia    Stable period on namenda 5mg  bid (or 10mg  nightly on dialysis days)      Depression    Continue low dose lexapro.       ESRD on hemodialysis (HCC)    Anuric. HD TThSat.       Essential hypertension    Chronic, stable. BP readings improving.       HLD (hyperlipidemia)    Chronic. Marked deterioration off lipitor. Will remain off medication at this time to avoid polypharmacy, may need restarted pending PVD eval.       Left foot pain - Primary    Concern for ischemic foot/toe - saw podiatry last week, started on doxycycline, it seems like VVS consult is pending. Will try and expedite, order arterial duplex and ABIs today. Discussed concern for necrotic toe, difficult situation given medical comorbidities. Will appreciate VVS eval.       Relevant Orders   VAS [place] LOWER EXTREMITY ARTERIAL DUPLEX   VAS [place] ABI WITH/WO TBI   Thrombocytopenia (HCC)    Chronic, stable.           No orders of the defined types were placed in this encounter.  No orders of the defined types were placed in this encounter.   Follow up plan: No Follow-up on file.  Eustaquio Boyden, MD

## 2017-08-09 NOTE — Assessment & Plan Note (Signed)
Continue low dose lexapro.

## 2017-08-09 NOTE — Assessment & Plan Note (Signed)
Concern for ischemic foot/toe - saw podiatry last week, started on doxycycline, it seems like VVS consult is pending. Will try and expedite, order arterial duplex and ABIs today. Discussed concern for necrotic toe, difficult situation given medical comorbidities. Will appreciate VVS eval.

## 2017-08-09 NOTE — Assessment & Plan Note (Signed)
Stable period on namenda 5mg  bid (or 10mg  nightly on dialysis days)

## 2017-08-09 NOTE — Assessment & Plan Note (Addendum)
Chronic. Marked deterioration off lipitor. Will remain off medication at this time to avoid polypharmacy, may need restarted pending PVD eval.

## 2017-08-09 NOTE — Assessment & Plan Note (Signed)
Chronic, stable. BP readings improving.

## 2017-08-09 NOTE — Assessment & Plan Note (Signed)
Continue to monitor

## 2017-08-09 NOTE — Assessment & Plan Note (Signed)
cbg elevated recently, low (likely falsely low due to anemia, will need fructosamine next labs). Poor diet contributes.

## 2017-08-10 ENCOUNTER — Ambulatory Visit (INDEPENDENT_AMBULATORY_CARE_PROVIDER_SITE_OTHER)
Admission: RE | Admit: 2017-08-10 | Discharge: 2017-08-10 | Disposition: A | Discharge status: Home / Self Care | Payer: Medicare Other | Source: Ambulatory Visit | Attending: Vascular Surgery | Admitting: Vascular Surgery

## 2017-08-10 ENCOUNTER — Ambulatory Visit (HOSPITAL_COMMUNITY)
Admission: RE | Admit: 2017-08-10 | Discharge: 2017-08-10 | Disposition: A | Discharge status: Home / Self Care | Payer: Medicare Other | Source: Ambulatory Visit | Attending: Vascular Surgery | Admitting: Vascular Surgery

## 2017-08-10 DIAGNOSIS — N2581 Secondary hyperparathyroidism of renal origin: Secondary | ICD-10-CM | POA: Diagnosis not present

## 2017-08-10 DIAGNOSIS — M79672 Pain in left foot: Secondary | ICD-10-CM | POA: Insufficient documentation

## 2017-08-10 DIAGNOSIS — N186 End stage renal disease: Secondary | ICD-10-CM | POA: Diagnosis not present

## 2017-08-10 DIAGNOSIS — Z992 Dependence on renal dialysis: Secondary | ICD-10-CM | POA: Diagnosis not present

## 2017-08-10 DIAGNOSIS — E1129 Type 2 diabetes mellitus with other diabetic kidney complication: Secondary | ICD-10-CM | POA: Diagnosis not present

## 2017-08-10 DIAGNOSIS — D509 Iron deficiency anemia, unspecified: Secondary | ICD-10-CM | POA: Diagnosis not present

## 2017-08-11 ENCOUNTER — Encounter: Payer: Self-pay | Admitting: Family Medicine

## 2017-08-11 DIAGNOSIS — I739 Peripheral vascular disease, unspecified: Secondary | ICD-10-CM | POA: Insufficient documentation

## 2017-08-12 DIAGNOSIS — Z992 Dependence on renal dialysis: Secondary | ICD-10-CM | POA: Diagnosis not present

## 2017-08-12 DIAGNOSIS — N2581 Secondary hyperparathyroidism of renal origin: Secondary | ICD-10-CM | POA: Diagnosis not present

## 2017-08-12 DIAGNOSIS — D509 Iron deficiency anemia, unspecified: Secondary | ICD-10-CM | POA: Diagnosis not present

## 2017-08-12 DIAGNOSIS — E1129 Type 2 diabetes mellitus with other diabetic kidney complication: Secondary | ICD-10-CM | POA: Diagnosis not present

## 2017-08-12 DIAGNOSIS — N186 End stage renal disease: Secondary | ICD-10-CM | POA: Diagnosis not present

## 2017-08-14 DIAGNOSIS — N2581 Secondary hyperparathyroidism of renal origin: Secondary | ICD-10-CM | POA: Diagnosis not present

## 2017-08-14 DIAGNOSIS — Z992 Dependence on renal dialysis: Secondary | ICD-10-CM | POA: Diagnosis not present

## 2017-08-14 DIAGNOSIS — E1129 Type 2 diabetes mellitus with other diabetic kidney complication: Secondary | ICD-10-CM | POA: Diagnosis not present

## 2017-08-14 DIAGNOSIS — D509 Iron deficiency anemia, unspecified: Secondary | ICD-10-CM | POA: Diagnosis not present

## 2017-08-14 DIAGNOSIS — N186 End stage renal disease: Secondary | ICD-10-CM | POA: Diagnosis not present

## 2017-08-17 DIAGNOSIS — N2581 Secondary hyperparathyroidism of renal origin: Secondary | ICD-10-CM | POA: Diagnosis not present

## 2017-08-17 DIAGNOSIS — N186 End stage renal disease: Secondary | ICD-10-CM | POA: Diagnosis not present

## 2017-08-17 DIAGNOSIS — Z992 Dependence on renal dialysis: Secondary | ICD-10-CM | POA: Diagnosis not present

## 2017-08-17 DIAGNOSIS — E1129 Type 2 diabetes mellitus with other diabetic kidney complication: Secondary | ICD-10-CM | POA: Diagnosis not present

## 2017-08-17 DIAGNOSIS — D509 Iron deficiency anemia, unspecified: Secondary | ICD-10-CM | POA: Diagnosis not present

## 2017-08-19 DIAGNOSIS — D509 Iron deficiency anemia, unspecified: Secondary | ICD-10-CM | POA: Diagnosis not present

## 2017-08-19 DIAGNOSIS — Z992 Dependence on renal dialysis: Secondary | ICD-10-CM | POA: Diagnosis not present

## 2017-08-19 DIAGNOSIS — N2581 Secondary hyperparathyroidism of renal origin: Secondary | ICD-10-CM | POA: Diagnosis not present

## 2017-08-19 DIAGNOSIS — N186 End stage renal disease: Secondary | ICD-10-CM | POA: Diagnosis not present

## 2017-08-19 DIAGNOSIS — E1129 Type 2 diabetes mellitus with other diabetic kidney complication: Secondary | ICD-10-CM | POA: Diagnosis not present

## 2017-08-21 DIAGNOSIS — N2581 Secondary hyperparathyroidism of renal origin: Secondary | ICD-10-CM | POA: Diagnosis not present

## 2017-08-21 DIAGNOSIS — N186 End stage renal disease: Secondary | ICD-10-CM | POA: Diagnosis not present

## 2017-08-21 DIAGNOSIS — D509 Iron deficiency anemia, unspecified: Secondary | ICD-10-CM | POA: Diagnosis not present

## 2017-08-21 DIAGNOSIS — E1129 Type 2 diabetes mellitus with other diabetic kidney complication: Secondary | ICD-10-CM | POA: Diagnosis not present

## 2017-08-21 DIAGNOSIS — Z992 Dependence on renal dialysis: Secondary | ICD-10-CM | POA: Diagnosis not present

## 2017-08-22 ENCOUNTER — Other Ambulatory Visit: Payer: Self-pay | Admitting: Family Medicine

## 2017-08-23 ENCOUNTER — Ambulatory Visit (INDEPENDENT_AMBULATORY_CARE_PROVIDER_SITE_OTHER): Payer: Medicare Other | Admitting: Vascular Surgery

## 2017-08-23 ENCOUNTER — Encounter: Payer: Self-pay | Admitting: *Deleted

## 2017-08-23 ENCOUNTER — Other Ambulatory Visit: Payer: Self-pay | Admitting: *Deleted

## 2017-08-23 ENCOUNTER — Other Ambulatory Visit: Payer: Self-pay

## 2017-08-23 ENCOUNTER — Encounter: Payer: Self-pay | Admitting: Vascular Surgery

## 2017-08-23 DIAGNOSIS — I7025 Atherosclerosis of native arteries of other extremities with ulceration: Secondary | ICD-10-CM | POA: Diagnosis not present

## 2017-08-23 NOTE — Progress Notes (Addendum)
  Requested by:  Gutierrez, Javier, MD [address] [place], [place] [address]  Reason for consultation: bilateral toe gangrene   History of Present Illness   [name] is a 84 y.o. (01/08/1929) female with dementia who presents with chief complaint: toe changes in L 3rd toe and R 2nd toe.  Onset of symptoms occurred 2-4 weeks, no obvious MOI.  Pt does not describe pain but does take Tylenol intermittent for pain in her feet, unable to qualify due to non-verbal nature.  History is obtained from family.  Patient has attempted to treat this pain with warm soak, lotion and abx.  Family notes L 3rd toe looks better since Augmentin was started by the Podiatrist.  The family also noted she was less interactive prior to starting the abx.  Atherosclerotic risk factors include: HLD, HTN.  Past Medical History:  Diagnosis Date  . Anxiety   . Bronchitis   . C. difficile diarrhea 02/01/2015  . Cough   . Depression    lexapro  . Diabetes mellitus    type 2  . Dyslipidemia    remote hx/notes 10/06/2009  . ESRD (end stage renal disease) on dialysis (HCC)    Dr Coladonato/[facility]  . GERD (gastroesophageal reflux disease)   . Hypertension   . 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 Osteoarthritis   . Staphylococcus aureus bacteremia with sepsis (HCC)    thought from HD catheter  . Thrombocytopenia (HCC) 11/16/2014  . Traumatic hematoma of left forearm 05/25/2015  . UTI (urinary tract infection) 05/2015    Past Surgical History:  Procedure Laterality Date  . ABDOMINAL HYSTERECTOMY     remote hx/notes 10/06/2009  . ARTERIOVENOUS GRAFT PLACEMENT Left 07/2004   forearm/notes 10/13/2010  . ARTERIOVENOUS GRAFT PLACEMENT Left 03/2006   upper armnotes 10/13/2010  . AVGG REMOVAL Right 04/10/2015   Procedure: REMOVAL OF RIGHT ARM  ARTERIOVENOUS GORETEX GRAFT (AVGG);  Surgeon: Valery Amedee L Celester Morgan, MD;  Location: [facility] OR;  Service: Vascular;  Laterality: Right;  . EYE SURGERY     Cataract  extraction  . INSERTION OF DIALYSIS CATHETER Left 04/10/2015   Procedure: INSERTION OF DIALYSIS CATHETER LEFT GROIN;  Surgeon: Nathanael Krist L Lucciano Vitali, MD;  Location: [facility] OR;  Service: Vascular;  Laterality: Left;  . PERIPHERAL VASCULAR CATHETERIZATION Right 02/18/2015   Procedure: A/V Shuntogram/Fistulagram;  Surgeon: [name] S [name], MD;  Location: [facility]ASIVE [facility];  Service: Cardiovascular;  Laterality: Right;  . PERIPHERAL VASCULAR CATHETERIZATION N/A 02/18/2015   Procedure: A/V Shunt Intervention;  Surgeon: [name] S [name], MD;  Location: [facility] IN[facility];  Service: Cardiovascular;  Laterality: N/A;  . PERIPHERAL VASCULAR CATHETERIZATION N/A 03/06/2015   Procedure: Dialysis/Perma Catheter Insertion;  Surgeon: [name] S [name], MD;  Location: [facility];  Service: Cardiovascular;  Laterality: N/A;  . REMOVAL OF A DIALYSIS CATHETER Right 04/10/2015   Procedure: REMOVAL OF RIGHT GROIN DIALYSIS CATHETER;  Surgeon: Hussain Maimone L Burney Calzadilla, MD;  Location: [facility] OR;  Service: Vascular;  Laterality: Right;  . REVISION OF ARTERIOVENOUS GORETEX GRAFT Right 03/08/2015   Procedure: REVISION OF ARTERIOVENOUS GORETEX GRAFT;  Surgeon: Gregory G Schnier, MD;  Location: [facility] ORS;  Service: Vascular;  Laterality: Right;  . THROMBECTOMY AND REVISION OF ARTERIOVENTOUS (AV) GORETEX  GRAFT Left 12/2004; 01/2006; 03/12/2009   forearmnotes 10/13/2010; forearmnotes 10/13/2010; upper arm/notes 03/19/2009  . VITRECTOMY Left 03/2001   with membrane peel/notes 10/13/2010    Social History   Socioeconomic History  . Marital status: Married      Spouse name: Not on file  . Number of children: Not on file  . Years of education: Not on file  . Highest education level: Not on file  Occupational History  . Not on file  Social Needs  . Financial resource strain: Not on file  . Food insecurity:    Worry: Not on file    Inability: Not on file  . Transportation needs:    Medical: Not on file    Non-medical: Not on file  Tobacco Use  . Smoking  status: Never Smoker  . Smokeless tobacco: Never Used  Substance and Sexual Activity  . Alcohol use: No    Alcohol/week: 0.0 oz  . Drug use: No  . Sexual activity: Never  Lifestyle  . Physical activity:    Days per week: Not on file    Minutes per session: Not on file  . Stress: Not on file  Relationships  . Social connections:    Talks on phone: Not on file    Gets together: Not on file    Attends religious service: Not on file    Active member of club or organization: Not on file    Attends meetings of clubs or organizations: Not on file    Relationship status: Not on file  . Intimate partner violence:    Fear of current or ex partner: Not on file    Emotionally abused: Not on file    Physically abused: Not on file    Forced sexual activity: Not on file  Other Topics Concern  . Not on file  Social History Narrative   Lives with husband and daughter (Gwendolyn Mella Townsend)   Occupation: retired, was insurance underwriter   Activity: in wheelchair, no regular exercise   Diet: drinks water and gatorade, good fruits/vegetables, avoids salt in diet    Family History  Problem Relation Age of Onset  . Hypertension Mother     Current Outpatient Medications  Medication Sig Dispense Refill  . acetaminophen (TYLENOL) 500 MG tablet Take 500 mg by mouth 2 (two) times daily as needed for mild pain.     . amLODipine (NORVASC) 5 MG tablet TAKE ONE-HALF TABLET BY MOUTH ON SUNDAY, MONDAY, WEDNESDAY, AND FRIDAY. TAKE ONE TABLET BY MOUTH ALL OTHER DAYS OF THE WEEK (Patient taking differently: TAKE 5 mg TABLET BY MOUTH ON SUNDAY, MONDAY, WEDNESDAY, AND FRIDAY.) 90 tablet 1  . aspirin 81 MG tablet Take 81 mg by mouth daily.    . carvedilol (COREG) 3.125 MG tablet TAKE 1 TABLET BY MOUTH TWICE DAILY WITH MEALS 180 tablet 0  . cinacalcet (SENSIPAR) 30 MG tablet Take 30 mg by mouth daily.    . Dorzolamide HCl-Timolol Mal (COSOPT OP) Place 1 drop into both eyes 2 (two) times daily.     .  doxycycline (VIBRA-TABS) 100 MG tablet Take 1 tablet (100 mg total) by mouth 2 (two) times daily.  0  . escitalopram (LEXAPRO) 10 MG tablet TAKE ONE-HALF TABLET BY MOUTH ONCE DAILY (Patient taking differently: TAKE 5 mg TABLET BY MOUTH ONCE DAILY) 45 tablet 3  . famotidine (PEPCID) 20 MG tablet Take 1 tablet (20 mg total) by mouth daily.    . folic acid-vitamin b complex-vitamin c-selenium-zinc (DIALYVITE) 3 MG TABS Take 1 tablet by mouth daily.      . memantine (NAMENDA) 5 MG tablet Take one tablet twice daily. Only take 2 tablets (10mg) in evening on dialysis days 180 tablet 1  . multivitamin (RENA-VIT) TABS tablet TAKE   ONE TABLET BY MOUTH AT BEDTIME 90 tablet 3  . ondansetron (ZOFRAN ODT) 4 MG disintegrating tablet Take 1 tablet (4 mg total) by mouth every 8 (eight) hours as needed for nausea. (Patient not taking: Reported on 08/23/2017) 6 tablet 0  . sevelamer carbonate (RENVELA) 0.8 g PACK packet Take 0.8 g by mouth 3 (three) times daily with meals.    . sevelamer carbonate (RENVELA) 800 MG tablet Take 2 tablets (1,600 mg total) by mouth 3 (three) times daily with meals. (Patient not taking: Reported on 08/23/2017) 180 tablet 1   No current facility-administered medications for this visit.     Allergies  Allergen Reactions  . Aricept [Donepezil Hcl] Other (See Comments)    Increased aggression    REVIEW OF SYSTEMS (negative unless checked):   Cardiac:  [] Chest pain or chest pressure? [] Shortness of breath upon activity? [] Shortness of breath when lying flat? [] Irregular heart rhythm?  Vascular:  [] Pain in calf, thigh, or hip brought on by walking? [x] Pain in feet at night that wakes you up from your sleep? [] Blood clot in your veins? [] Leg swelling?  Pulmonary:  [] Oxygen at home? [] Productive cough? [] Wheezing?  Neurologic:  [] Sudden weakness in arms or legs? [] Sudden numbness in arms or legs? [] Sudden onset of difficult speaking or slurred speech? []  Temporary loss of vision in one eye? [] Problems with dizziness?  Gastrointestinal:  [] Blood in stool? [] Vomited blood?  Genitourinary:  [] Burning when urinating? [] Blood in urine?  Psychiatric:  [] Major depression  Hematologic:  [] Bleeding problems? [] Problems with blood clotting?  Dermatologic:  [] Rashes or ulcers?  Constitutional:  [] Fever or chills?  Ear/Nose/Throat:  [] Change in hearing? [] Nose bleeds? [] Sore throat?  Musculoskeletal:  [] Back pain? [] Joint pain? [] Muscle pain?   For VQI Use Only   PRE-ADM LIVING Home  AMB STATUS Ambulatory  CAD Sx None  PRIOR CHF None  STRESS TEST No    Physical Examination     Vitals:   08/23/17 1500  BP: (!) 72/34  Pulse: 75  Resp: 14  Temp: 98.3 F (36.8 C)  TempSrc: Oral  SpO2: 100%  Weight: 106 lb (48.1 kg)  Height: 5' 3" (1.6 m)   Body mass index is 18.78 kg/m.  General Alert, O x 3, WD, Elderly, minimally interactive  Head [place]/AT,    Ear/Nose/ Throat Limited response to speech, nares without erythema or drainage, oropharynx without Erythema or Exudate, Mallampati score: 3,   Eyes PERRL, limited tracking  Neck Supple, mid-line trachea,    Pulmonary Sym exp, good B air movt, CTA B  Cardiac RRR, Nl S1, S2, no Murmurs, No rubs, No S3,S4  Vascular Vessel Right Left  Radial Palpable Palpable  Brachial Palpable Palpable  Carotid Palpable, No Bruit Palpable, No Bruit  Aorta Not palpable N/A  Femoral Palpable Palpable  Popliteal Not palpable Not palpable  PT Not palpable Not palpable  DP Not palpable Not palpable    Gastro- intestinal soft, non-distended, non-tender to palpation, No guarding or rebound, no HSM, no masses, no CVAT B, No palpable prominent aortic pulse,    Musculo- skeletal Limited motor testing due poor cooperation, moves all ext spont, Ischemic changes as documented below in photo, No edema present, No visible varicosities , No Lipodermatosclerosis present    Neurologic Limited exam due to poor cooperation  Psychiatric Limited exam due   to poor cooperation  Dermatologic See M/S exam for extremity exam, No rashes otherwise noted  Lymphatic  Palpable lymph nodes: None        Non-Invasive Vascular Imaging   ABI (08/10/17)   R:   ABI: 0.52,   PT: mono  DP: mono  TBI:  0  L:   ABI: 0.60,   PT: none  DP: none   Peroneal: mono  TBI: 0  BLE Arterial Duplex (08/10/17)  Right Duplex Findings: +-----------+--------+-----+--------+----------+--------+       PSV cm/sRatioStenosisWaveform Comments +-----------+--------+-----+--------+----------+--------+ CFA Prox  94          biphasic      +-----------+--------+-----+--------+----------+--------+ DFA    53          biphasic      +-----------+--------+-----+--------+----------+--------+ SFA Prox  66          biphasic      +-----------+--------+-----+--------+----------+--------+ SFA Mid  52          biphasic      +-----------+--------+-----+--------+----------+--------+ SFA Distal 65          biphasic      +-----------+--------+-----+--------+----------+--------+ POP Prox  97          biphasic      +-----------+--------+-----+--------+----------+--------+ ATA Distal 10          monophasic     +-----------+--------+-----+--------+----------+--------+ PTA Distal 13          monophasic     +-----------+--------+-----+--------+----------+--------+ PERO Distal19          monophasic     +-----------+--------+-----+--------+----------+--------+   Left Duplex Findings: +-----------+--------+-----+--------+----------+--------+       PSV cm/sRatioStenosisWaveform Comments +-----------+--------+-----+--------+----------+--------+ CFA Prox  85           biphasic      +-----------+--------+-----+--------+----------+--------+ DFA    73          biphasic      +-----------+--------+-----+--------+----------+--------+ SFA Prox  73          biphasic      +-----------+--------+-----+--------+----------+--------+ SFA Mid  46          biphasic      +-----------+--------+-----+--------+----------+--------+ SFA Distal 52          biphasic      +-----------+--------+-----+--------+----------+--------+ POP Prox  38          biphasic      +-----------+--------+-----+--------+----------+--------+ ATA Distal 19          monophasic     +-----------+--------+-----+--------+----------+--------+ PTA Distal 46          monophasic     +-----------+--------+-----+--------+----------+--------+ PERO Distal18          monophasic     +-----------+--------+-----+--------+----------+--------+   Final Interpretation: Right: No prior exam. 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 *See table(s) above for measurements and observations.   Outside Studies/Documentation   5 pages of outside documents were reviewed including: Podiatry record.   Medical Decision Making   [name] is a 84 y.o. female who presents with: BLE critical limb ischemia in form B toe gangrene (L>R), dementia   I discussed with the patient the natural history of critical limb ischemia: 25% require amputation in one year, 50% are able to maintain their limbs in one year, and 25-30% die in one year due to comorbidities.  Given the limb threatening status of this patient, I recommend an aggressive work up including proceeding with an: Aortogram, Bilateral runoff and possible LLE intervention in [facility] due her dementia. I discussed with the patient's family   the nature of angiographic  procedures, especially the limited patencies of any endovascular intervention. The patient's family  is aware of that the risks of an angiographic procedure include but are not limited to: bleeding, infection, access site complications, embolization, rupture of treated vessel, dissection, possible need for emergent surgical intervention, and possible need for surgical procedures to treat the patient's pathology. The patient's family is aware of the risks and agrees to proceed.  The procedure is scheduled for: 8 APR 19.  I discussed in depth with the patient's family the nature of atherosclerosis, and emphasized the importance of maximal medical management including strict control of blood pressure, blood glucose, and lipid levels, antiplatelet agents, obtaining regular exercise, and cessation of smoking.  The patient is aware that without maximal medical management the underlying atherosclerotic disease process will progress, limiting the benefit of any interventions. The patient is currently not on on statin as not medically indicated.  The patient is currently on an anti-platelet: ASA.  Thank you for allowing us to participate in this patient's care.   Maclane Holloran, MD, FACS [facility] Office: [phone] Pager: [phone]  08/23/2017, 3:14 PM    

## 2017-08-23 NOTE — H&P (View-Only) (Signed)
Requested by:  Eustaquio Boyden, MD [address] [place], [place] [address]  Reason for consultation: bilateral toe gangrene   History of Present Illness   [name] is a 84 y.o. (July 09, 1928) female with dementia who presents with chief complaint: toe changes in L 3rd toe and R 2nd toe.  Onset of symptoms occurred 2-4 weeks, no obvious MOI.  Pt does not describe pain but does take Tylenol intermittent for pain in her feet, unable to qualify due to non-verbal nature.  History is obtained from family.  Patient has attempted to treat this pain with warm soak, lotion and abx.  Family notes L 3rd toe looks better since Augmentin was started by the Podiatrist.  The family also noted she was less interactive prior to starting the abx.  Atherosclerotic risk factors include: HLD, HTN.  Past Medical History:  Diagnosis Date  . Anxiety   . Bronchitis   . C. difficile diarrhea 02/01/2015  . Cough   . Depression    lexapro  . Diabetes mellitus    type 2  . Dyslipidemia    remote hx/notes 10/06/2009  . ESRD (end stage renal disease) on dialysis [facility])    Dr Coladonato/[place]ell  . GERD (gastroesophageal reflux disease)   . Hypertension   . 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 Osteoarthritis   . Staphylococcus aureus bacteremia with sepsis (HCC)    thought from HD catheter  . Thrombocytopenia (HCC) 11/16/2014  . Traumatic hematoma of left forearm 05/25/2015  . UTI (urinary tract infection) 05/2015    Past Surgical History:  Procedure Laterality Date  . ABDOMINAL HYSTERECTOMY     remote hx/notes 10/06/2009  . ARTERIOVENOUS GRAFT PLACEMENT Left 07/2004   forearm/notes 10/13/2010  . ARTERIOVENOUS GRAFT PLACEMENT Left 03/2006   upper armnotes 10/13/2010  . AVGG REMOVAL Right 04/10/2015   Procedure: REMOVAL OF RIGHT ARM  ARTERIOVENOUS GORETEX GRAFT (AVGG);  Surgeon: Fransisco Hertz, MD;  Location: [facility] OR;  Service: Vascular;  Laterality: Right;  . EYE SURGERY     Cataract  extraction  . INSERTION OF DIALYSIS CATHETER Left 04/10/2015   Procedure: INSERTION OF DIALYSIS CATHETER LEFT GROIN;  Surgeon: Fransisco Hertz, MD;  Location: [facility] OR;  Service: Vascular;  Laterality: Left;  . PERIPHERAL VASCULAR CATHETERIZATION Right 02/18/2015   Procedure: A/V Shuntogram/Fistulagram;  Surgeon: Annice Needy, MD;  Location: [facility] INVASIVE CV LAB;  Service: Cardiovascular;  Laterality: Right;  . PERIPHERAL VASCULAR CATHETERIZATION N/A 02/18/2015   Procedure: A/V Shunt Intervention;  Surgeon: Annice Needy, MD;  Location: [facility] INVASIVE CV LAB;  Service: Cardiovascular;  Laterality: N/A;  . PERIPHERAL VASCULAR CATHETERIZATION N/A 03/06/2015   Procedure: Dialysis/Perma Catheter Insertion;  Surgeon: Annice Needy, MD;  Location: [facility] INVASIVE CV LAB;  Service: Cardiovascular;  Laterality: N/A;  . REMOVAL OF A DIALYSIS CATHETER Right 04/10/2015   Procedure: REMOVAL OF RIGHT GROIN DIALYSIS CATHETER;  Surgeon: Fransisco Hertz, MD;  Location: [facility] OR;  Service: Vascular;  Laterality: Right;  . REVISION OF ARTERIOVENOUS GORETEX GRAFT Right 03/08/2015   Procedure: REVISION OF ARTERIOVENOUS GORETEX GRAFT;  Surgeon: Renford Dills, MD;  Location: [facility] ORS;  Service: Vascular;  Laterality: Right;  . THROMBECTOMY AND REVISION OF ARTERIOVENTOUS (AV) GORETEX  GRAFT Left 12/2004; 01/2006; 03/12/2009   forearmnotes 10/13/2010; forearmnotes 10/13/2010; upper arm/notes 03/19/2009  . VITRECTOMY Left 03/2001   with membrane peel/notes 10/13/2010    Social History   Socioeconomic History  . Marital status: Married  Spouse name: Not on file  . Number of children: Not on file  . Years of education: Not on file  . Highest education level: Not on file  Occupational History  . Not on file  Social Needs  . Financial resource strain: Not on file  . Food insecurity:    Worry: Not on file    Inability: Not on file  . Transportation needs:    Medical: Not on file    Non-medical: Not on file  Tobacco Use  . Smoking  status: Never Smoker  . Smokeless tobacco: Never Used  Substance and Sexual Activity  . Alcohol use: No    Alcohol/week: 0.0 oz  . Drug use: No  . Sexual activity: Never  Lifestyle  . Physical activity:    Days per week: Not on file    Minutes per session: Not on file  . Stress: Not on file  Relationships  . Social connections:    Talks on phone: Not on file    Gets together: Not on file    Attends religious service: Not on file    Active member of club or organization: Not on file    Attends meetings of clubs or organizations: Not on file    Relationship status: Not on file  . Intimate partner violence:    Fear of current or ex partner: Not on file    Emotionally abused: Not on file    Physically abused: Not on file    Forced sexual activity: Not on file  Other Topics Concern  . Not on file  Social History Narrative   Lives with husband and daughter [name] Palmina Clodfelter)   Occupation: retired, was Marine scientist   Activity: in wheelchair, no regular exercise   Diet: drinks water and gatorade, good fruits/vegetables, avoids salt in diet    Family History  Problem Relation Age of Onset  . Hypertension Mother     Current Outpatient Medications  Medication Sig Dispense Refill  . acetaminophen (TYLENOL) 500 MG tablet Take 500 mg by mouth 2 (two) times daily as needed for mild pain.     [name] amLODipine (NORVASC) 5 MG tablet TAKE ONE-HALF TABLET BY MOUTH ON SUNDAY, MONDAY, WEDNESDAY, AND FRIDAY. TAKE ONE TABLET BY MOUTH ALL OTHER DAYS OF THE WEEK (Patient taking differently: TAKE 5 mg TABLET BY MOUTH ON SUNDAY, MONDAY, WEDNESDAY, AND FRIDAY.) 90 tablet 1  . aspirin 81 MG tablet Take 81 mg by mouth daily.    . carvedilol (COREG) 3.125 MG tablet TAKE 1 TABLET BY MOUTH TWICE DAILY WITH MEALS 180 tablet 0  . cinacalcet (SENSIPAR) 30 MG tablet Take 30 mg by mouth daily.    . Dorzolamide HCl-Timolol Mal (COSOPT OP) Place 1 drop into both eyes 2 (two) times daily.     [name]  doxycycline (VIBRA-TABS) 100 MG tablet Take 1 tablet (100 mg total) by mouth 2 (two) times daily.  0  . escitalopram (LEXAPRO) 10 MG tablet TAKE ONE-HALF TABLET BY MOUTH ONCE DAILY (Patient taking differently: TAKE 5 mg TABLET BY MOUTH ONCE DAILY) 45 tablet 3  . famotidine (PEPCID) 20 MG tablet Take 1 tablet (20 mg total) by mouth daily.    . folic acid-vitamin b complex-vitamin c-selenium-zinc (DIALYVITE) 3 MG TABS Take 1 tablet by mouth daily.      . memantine (NAMENDA) 5 MG tablet Take one tablet twice daily. Only take 2 tablets (10mg ) in evening on dialysis days 180 tablet 1  . multivitamin (RENA-VIT) TABS tablet TAKE  ONE TABLET BY MOUTH AT BEDTIME 90 tablet 3  . ondansetron (ZOFRAN ODT) 4 MG disintegrating tablet Take 1 tablet (4 mg total) by mouth every 8 (eight) hours as needed for nausea. (Patient not taking: Reported on 08/23/2017) 6 tablet 0  . sevelamer carbonate (RENVELA) 0.8 g PACK packet Take 0.8 g by mouth 3 (three) times daily with meals.    . sevelamer carbonate (RENVELA) 800 MG tablet Take 2 tablets (1,600 mg total) by mouth 3 (three) times daily with meals. (Patient not taking: Reported on 08/23/2017) 180 tablet 1   No current facility-administered medications for this visit.     Allergies  Allergen Reactions  . Aricept [Donepezil Hcl] Other (See Comments)    Increased aggression    REVIEW OF SYSTEMS (negative unless checked):   Cardiac:  []  Chest pain or chest pressure? []  Shortness of breath upon activity? []  Shortness of breath when lying flat? []  Irregular heart rhythm?  Vascular:  []  Pain in calf, thigh, or hip brought on by walking? [x]  Pain in feet at night that wakes you up from your sleep? []  Blood clot in your veins? []  Leg swelling?  Pulmonary:  []  Oxygen at home? []  Productive cough? []  Wheezing?  Neurologic:  []  Sudden weakness in arms or legs? []  Sudden numbness in arms or legs? []  Sudden onset of difficult speaking or slurred speech? []   Temporary loss of vision in one eye? []  Problems with dizziness?  Gastrointestinal:  []  Blood in stool? []  Vomited blood?  Genitourinary:  []  Burning when urinating? []  Blood in urine?  Psychiatric:  []  Major depression  Hematologic:  []  Bleeding problems? []  Problems with blood clotting?  Dermatologic:  []  Rashes or ulcers?  Constitutional:  []  Fever or chills?  Ear/Nose/Throat:  []  Change in hearing? []  Nose bleeds? []  Sore throat?  Musculoskeletal:  []  Back pain? []  Joint pain? []  Muscle pain?   For VQI Use Only   PRE-ADM LIVING Home  AMB STATUS Ambulatory  CAD Sx None  PRIOR CHF None  STRESS TEST No    Physical Examination     Vitals:   08/23/17 1500  BP: (!) 72/34  Pulse: 75  Resp: 14  Temp: 98.3 F (36.8 C)  TempSrc: Oral  SpO2: 100%  Weight: 106 lb (48.1 kg)  Height: 5\' 3"  (1.6 m)   Body mass index is 18.78 kg/m.  General Alert, O x 3, WD, Elderly, minimally interactive  Head [place]/AT,    Ear/Nose/ Throat Limited response to speech, nares without erythema or drainage, oropharynx without Erythema or Exudate, Mallampati score: 3,   Eyes PERRL, limited tracking  Neck Supple, mid-line trachea,    Pulmonary Sym exp, good B air movt, CTA B  Cardiac RRR, Nl S1, S2, no Murmurs, No rubs, No S3,S4  Vascular Vessel Right Left  Radial Palpable Palpable  Brachial Palpable Palpable  Carotid Palpable, No Bruit Palpable, No Bruit  Aorta Not palpable N/A  Femoral Palpable Palpable  Popliteal Not palpable Not palpable  PT Not palpable Not palpable  DP Not palpable Not palpable    Gastro- intestinal soft, non-distended, non-tender to palpation, No guarding or rebound, no HSM, no masses, no CVAT B, No palpable prominent aortic pulse,    Musculo- skeletal Limited motor testing due poor cooperation, moves all ext spont, Ischemic changes as documented below in photo, No edema present, No visible varicosities , No Lipodermatosclerosis present    Neurologic Limited exam due to poor cooperation  Psychiatric Limited exam due  to poor cooperation  Dermatologic See M/S exam for extremity exam, No rashes otherwise noted  Lymphatic  Palpable lymph nodes: None        Non-Invasive Vascular Imaging   ABI (08/10/17)   R:   ABI: 0.52,   PT: mono  DP: mono  TBI:  0  L:   ABI: 0.60,   PT: none  DP: none   Peroneal: mono  TBI: 0  BLE Arterial Duplex (08/10/17)  Right Duplex Findings: +-----------+--------+-----+--------+----------+--------+       PSV cm/sRatioStenosisWaveform Comments +-----------+--------+-----+--------+----------+--------+ CFA Prox  94          biphasic      +-----------+--------+-----+--------+----------+--------+ DFA    53          biphasic      +-----------+--------+-----+--------+----------+--------+ SFA Prox  66          biphasic      +-----------+--------+-----+--------+----------+--------+ SFA Mid  52          biphasic      +-----------+--------+-----+--------+----------+--------+ SFA Distal 65          biphasic      +-----------+--------+-----+--------+----------+--------+ POP Prox  97          biphasic      +-----------+--------+-----+--------+----------+--------+ ATA Distal 10          monophasic     +-----------+--------+-----+--------+----------+--------+ PTA Distal 13          monophasic     +-----------+--------+-----+--------+----------+--------+ PERO Distal19          monophasic     +-----------+--------+-----+--------+----------+--------+   Left Duplex Findings: +-----------+--------+-----+--------+----------+--------+       PSV cm/sRatioStenosisWaveform Comments +-----------+--------+-----+--------+----------+--------+ CFA Prox  85           biphasic      +-----------+--------+-----+--------+----------+--------+ DFA    73          biphasic      +-----------+--------+-----+--------+----------+--------+ SFA Prox  73          biphasic      +-----------+--------+-----+--------+----------+--------+ SFA Mid  46          biphasic      +-----------+--------+-----+--------+----------+--------+ SFA Distal 52          biphasic      +-----------+--------+-----+--------+----------+--------+ POP Prox  38          biphasic      +-----------+--------+-----+--------+----------+--------+ ATA Distal 19          monophasic     +-----------+--------+-----+--------+----------+--------+ PTA Distal 46          monophasic     +-----------+--------+-----+--------+----------+--------+ PERO Distal18          monophasic     +-----------+--------+-----+--------+----------+--------+   Final Interpretation: Right: No prior exam. 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 *See table(s) above for measurements and observations.   Outside Studies/Documentation   5 pages of outside documents were reviewed including: Podiatry record.   Medical Decision Making   [name] is a 84 y.o. female who presents with: BLE critical limb ischemia in form B toe gangrene (L>R), dementia   I discussed with the patient the natural history of critical limb ischemia: 25% require amputation in one year, 50% are able to maintain their limbs in one year, and 25-30% die in one year due to comorbidities.  Given the limb threatening status of this patient, I recommend an aggressive work up including proceeding with an: Aortogram, Bilateral runoff and possible LLE intervention in [facility] due her dementia. I discussed with the patient's family  the nature of angiographic  procedures, especially the limited patencies of any endovascular intervention. The patient's family  is aware of that the risks of an angiographic procedure include but are not limited to: bleeding, infection, access site complications, embolization, rupture of treated vessel, dissection, possible need for emergent surgical intervention, and possible need for surgical procedures to treat the patient's pathology. The patient's family is aware of the risks and agrees to proceed.  The procedure is scheduled for: 8 APR 19.  I discussed in depth with the patient's family the nature of atherosclerosis, and emphasized the importance of maximal medical management including strict control of blood pressure, blood glucose, and lipid levels, antiplatelet agents, obtaining regular exercise, and cessation of smoking.  The patient is aware that without maximal medical management the underlying atherosclerotic disease process will progress, limiting the benefit of any interventions. The patient is currently not on on statin as not medically indicated.  The patient is currently on an anti-platelet: ASA.  Thank you for allowing [place] to participate in this patient's care.   Leonides Sake, MD, FACS [facility] Office: [phone] Pager: [phone]  08/23/2017, 3:14 PM

## 2017-08-24 DIAGNOSIS — N2581 Secondary hyperparathyroidism of renal origin: Secondary | ICD-10-CM | POA: Diagnosis not present

## 2017-08-24 DIAGNOSIS — Z992 Dependence on renal dialysis: Secondary | ICD-10-CM | POA: Diagnosis not present

## 2017-08-24 DIAGNOSIS — N186 End stage renal disease: Secondary | ICD-10-CM | POA: Diagnosis not present

## 2017-08-24 DIAGNOSIS — D509 Iron deficiency anemia, unspecified: Secondary | ICD-10-CM | POA: Diagnosis not present

## 2017-08-24 DIAGNOSIS — E1129 Type 2 diabetes mellitus with other diabetic kidney complication: Secondary | ICD-10-CM | POA: Diagnosis not present

## 2017-08-26 DIAGNOSIS — D509 Iron deficiency anemia, unspecified: Secondary | ICD-10-CM | POA: Diagnosis not present

## 2017-08-26 DIAGNOSIS — N186 End stage renal disease: Secondary | ICD-10-CM | POA: Diagnosis not present

## 2017-08-26 DIAGNOSIS — N2581 Secondary hyperparathyroidism of renal origin: Secondary | ICD-10-CM | POA: Diagnosis not present

## 2017-08-26 DIAGNOSIS — Z992 Dependence on renal dialysis: Secondary | ICD-10-CM | POA: Diagnosis not present

## 2017-08-26 DIAGNOSIS — E1129 Type 2 diabetes mellitus with other diabetic kidney complication: Secondary | ICD-10-CM | POA: Diagnosis not present

## 2017-08-28 DIAGNOSIS — N186 End stage renal disease: Secondary | ICD-10-CM | POA: Diagnosis not present

## 2017-08-28 DIAGNOSIS — E1129 Type 2 diabetes mellitus with other diabetic kidney complication: Secondary | ICD-10-CM | POA: Diagnosis not present

## 2017-08-28 DIAGNOSIS — Z992 Dependence on renal dialysis: Secondary | ICD-10-CM | POA: Diagnosis not present

## 2017-08-28 DIAGNOSIS — D509 Iron deficiency anemia, unspecified: Secondary | ICD-10-CM | POA: Diagnosis not present

## 2017-08-28 DIAGNOSIS — N2581 Secondary hyperparathyroidism of renal origin: Secondary | ICD-10-CM | POA: Diagnosis not present

## 2017-08-30 DIAGNOSIS — E1129 Type 2 diabetes mellitus with other diabetic kidney complication: Secondary | ICD-10-CM | POA: Diagnosis not present

## 2017-08-30 DIAGNOSIS — Z992 Dependence on renal dialysis: Secondary | ICD-10-CM | POA: Diagnosis not present

## 2017-08-30 DIAGNOSIS — N186 End stage renal disease: Secondary | ICD-10-CM | POA: Diagnosis not present

## 2017-08-31 DIAGNOSIS — N186 End stage renal disease: Secondary | ICD-10-CM | POA: Diagnosis not present

## 2017-08-31 DIAGNOSIS — N2581 Secondary hyperparathyroidism of renal origin: Secondary | ICD-10-CM | POA: Diagnosis not present

## 2017-08-31 DIAGNOSIS — Z992 Dependence on renal dialysis: Secondary | ICD-10-CM | POA: Diagnosis not present

## 2017-08-31 DIAGNOSIS — E1129 Type 2 diabetes mellitus with other diabetic kidney complication: Secondary | ICD-10-CM | POA: Diagnosis not present

## 2017-08-31 DIAGNOSIS — D509 Iron deficiency anemia, unspecified: Secondary | ICD-10-CM | POA: Diagnosis not present

## 2017-08-31 NOTE — Pre-Procedure Instructions (Addendum)
[name]  08/31/2017      [facility] [place], [place] - [address] [place] [address] Phone: [phone] Fax: [phone]  [facility] [place], [place] - [address] [place] [address] Phone: [phone] Fax: [phone]    Your procedure is scheduled on Mon., September 06, 2017  [facility] "A" at 7:45AM  Call this number if you have problems the morning of surgery:  [phone]   Remember:  Do not eat food or drink liquids after midnight.  Take these medicines the morning of surgery with A SIP OF WATER: Aspirin, AmLODipine (NORVASC), Carvedilol (COREG), Escitalopram (LEXAPRO), Famotidine (PEPCID), Memantine (NAMENDA), and Dorzolamide-timolol (COSOPT). If needed Acetaminophen (TYLENOL) for pain  As of today, stop taking all Aspirins, Vitamins, Fish oils, and Herbal medications. Also stop all NSAIDS i.e. Advil, Ibuprofen, Motrin, Aleve, Anaprox, Naproxen, BC and Goody Powders.  How to Manage Your Diabetes Before and After Surgery  Why is it important to control my blood sugar before and after surgery? . Improving blood sugar levels before and after surgery helps healing and can limit problems. . A way of improving blood sugar control is eating a healthy diet by: o  Eating less sugar and carbohydrates o  Increasing activity/exercise o  Talking with your doctor about reaching your blood sugar goals . High blood sugars (greater than 180 mg/dL) can raise your risk of infections and slow your recovery, so you will need to focus on controlling your diabetes during the weeks before surgery. . Make sure that the doctor who takes care of your diabetes knows about your planned surgery including the date and location.  How do I manage my blood sugar before surgery? . Check your blood sugar at least 4 times a day, starting 2  days before surgery, to make sure that the level is not too high or low. o Check your blood sugar the morning of your surgery when you wake up and every 2 hours until you get to the Short Stay unit. . If your blood sugar is less than 70 mg/dL, you will need to treat for low blood sugar: o Do not take insulin. o Treat a low blood sugar (less than 70 mg/dL) with  cup of clear juice (cranberry or apple), 4 glucose tablets, OR glucose gel. Recheck blood sugar in 15 minutes after treatment (to make sure it is greater than 70 mg/dL). If your blood sugar is not greater than 70 mg/dL on recheck, call [phone] o  for further instructions. . Report your blood sugar to the short stay nurse when you get to Short Stay.  . If you are admitted to the hospital after surgery: o Your blood sugar will be checked by the staff and you will probably be given insulin after surgery (instead of oral diabetes medicines) to make sure you have good blood sugar levels. o The goal for blood sugar control after surgery is 80-180 ml  . If your CBG is greater than 220 mg/dL, call us at [phone]   Do not wear jewelry, make-up or nail polish.  Do not wear lotions, powders,  perfumes, or deodorant.  Do not shave 48 hours prior to surgery.    Do not bring valuables to the hospital.  [facility] is not responsible for any belongings or valuables.  Contacts, dentures or bridgework may not be worn into surgery.  Leave your suitcase in the car.  After surgery it may be brought to your room.  For patients admitted to the hospital, discharge time will be determined by your treatment team.  Patients discharged the day of surgery will not be allowed to drive home.   Special instructions:   [facility]- Preparing For Surgery  Before surgery, you can play an important role. Because skin is not sterile, your skin needs to be as free of germs as possible. You can reduce the number of germs on your skin by washing with CHG  (chlorahexidine gluconate) Soap before surgery.  CHG is an antiseptic cleaner which kills germs and bonds with the skin to continue killing germs even after washing.  Please do not use if you have an allergy to CHG or antibacterial soaps. If your skin becomes reddened/irritated stop using the CHG.  Do not shave (including legs and underarms) for at least 48 hours prior to first CHG shower. It is OK to shave your face.  Please follow these instructions carefully.   1. Shower the NIGHT BEFORE SURGERY and the MORNING OF SURGERY with CHG.   2. If you chose to wash your hair, wash your hair first as usual with your normal shampoo.  3. After you shampoo, rinse your hair and body thoroughly to remove the shampoo.  4. Use CHG as you would any other liquid soap. You can apply CHG directly to the skin and wash gently with a scrungie or a clean washcloth.   5. Apply the CHG Soap to your body ONLY FROM THE NECK DOWN.  Do not use on open wounds or open sores. Avoid contact with your eyes, ears, mouth and genitals (private parts). Wash Face and genitals (private parts)  with your normal soap.  6. Wash thoroughly, paying special attention to the area where your surgery will be performed.  7. Thoroughly rinse your body with warm water from the neck down.  8. DO NOT shower/wash with your normal soap after using and rinsing off the CHG Soap.  9. Pat yourself dry with a CLEAN TOWEL.  10. Wear CLEAN PAJAMAS to bed the night before surgery, wear comfortable clothes the morning of surgery  11. Place CLEAN SHEETS on your bed the night of your first shower and DO NOT SLEEP WITH PETS.  Day of Surgery: Do not apply any deodorants/lotions. Please wear clean clothes to the hospital/surgery center.    Please read over the following fact sheets that you were given. Pain Booklet, Coughing and Deep Breathing, MRSA Information and Surgical Site Infection Prevention

## 2017-09-01 ENCOUNTER — Inpatient Hospital Stay (HOSPITAL_COMMUNITY)
Admission: RE | Admit: 2017-09-01 | Discharge: 2017-09-01 | Disposition: A | Discharge status: Home / Self Care | Payer: Medicare Other | Source: Ambulatory Visit

## 2017-09-02 ENCOUNTER — Encounter (HOSPITAL_COMMUNITY): Payer: Self-pay

## 2017-09-02 ENCOUNTER — Encounter (HOSPITAL_COMMUNITY)
Admission: RE | Admit: 2017-09-02 | Discharge: 2017-09-02 | Disposition: A | Discharge status: Home / Self Care | Payer: Medicare Other | Source: Ambulatory Visit | Attending: Vascular Surgery | Admitting: Vascular Surgery

## 2017-09-02 ENCOUNTER — Other Ambulatory Visit: Payer: Self-pay

## 2017-09-02 DIAGNOSIS — Z01810 Encounter for preprocedural cardiovascular examination: Secondary | ICD-10-CM | POA: Insufficient documentation

## 2017-09-02 DIAGNOSIS — E785 Hyperlipidemia, unspecified: Secondary | ICD-10-CM | POA: Diagnosis not present

## 2017-09-02 DIAGNOSIS — I120 Hypertensive chronic kidney disease with stage 5 chronic kidney disease or end stage renal disease: Secondary | ICD-10-CM | POA: Diagnosis not present

## 2017-09-02 DIAGNOSIS — I739 Peripheral vascular disease, unspecified: Secondary | ICD-10-CM | POA: Diagnosis not present

## 2017-09-02 DIAGNOSIS — Z7982 Long term (current) use of aspirin: Secondary | ICD-10-CM | POA: Diagnosis not present

## 2017-09-02 DIAGNOSIS — E1122 Type 2 diabetes mellitus with diabetic chronic kidney disease: Secondary | ICD-10-CM | POA: Diagnosis not present

## 2017-09-02 DIAGNOSIS — Z79899 Other long term (current) drug therapy: Secondary | ICD-10-CM | POA: Diagnosis not present

## 2017-09-02 DIAGNOSIS — Z01812 Encounter for preprocedural laboratory examination: Secondary | ICD-10-CM | POA: Diagnosis not present

## 2017-09-02 DIAGNOSIS — N186 End stage renal disease: Secondary | ICD-10-CM | POA: Insufficient documentation

## 2017-09-02 DIAGNOSIS — Z992 Dependence on renal dialysis: Secondary | ICD-10-CM | POA: Diagnosis not present

## 2017-09-02 DIAGNOSIS — F0390 Unspecified dementia without behavioral disturbance: Secondary | ICD-10-CM | POA: Diagnosis not present

## 2017-09-02 DIAGNOSIS — K219 Gastro-esophageal reflux disease without esophagitis: Secondary | ICD-10-CM | POA: Diagnosis not present

## 2017-09-02 HISTORY — DX: Unspecified dementia, unspecified severity, without behavioral disturbance, psychotic disturbance, mood disturbance, and anxiety: F03.90

## 2017-09-02 HISTORY — DX: Peripheral vascular disease, unspecified: I73.9

## 2017-09-02 LAB — BASIC METABOLIC PANEL
Anion gap: 15 (ref 5–15)
BUN: 40 mg/dL — AB (ref 6–20)
CO2: 29 mmol/L (ref 22–32)
CREATININE: 5.17 mg/dL — AB (ref 0.44–1.00)
Calcium: 8.3 mg/dL — ABNORMAL LOW (ref 8.9–10.3)
Chloride: 95 mmol/L — ABNORMAL LOW (ref 101–111)
GFR, EST AFRICAN AMERICAN: 8 mL/min — AB (ref >60)
GFR, EST NON AFRICAN AMERICAN: 7 mL/min — AB (ref >60)
Glucose, Bld: 195 mg/dL — ABNORMAL HIGH (ref 65–99)
Potassium: 3.5 mmol/L (ref 3.5–5.1)
SODIUM: 139 mmol/L (ref 135–145)

## 2017-09-02 LAB — CBC
HCT: 28.6 % — ABNORMAL LOW (ref 36.0–46.0)
Hemoglobin: 9.3 g/dL — ABNORMAL LOW (ref 12.0–15.0)
MCH: 31.4 pg (ref 26.0–34.0)
MCHC: 32.5 g/dL (ref 30.0–36.0)
MCV: 96.6 fL (ref 78.0–100.0)
PLATELETS: 123 10*3/uL — AB (ref 150–400)
RBC: 2.96 MIL/uL — AB (ref 3.87–5.11)
RDW: 18.6 % — AB (ref 11.5–15.5)
WBC: 5.3 10*3/uL (ref 4.0–10.5)

## 2017-09-02 LAB — SURGICAL PCR SCREEN
MRSA, PCR: NEGATIVE
STAPHYLOCOCCUS AUREUS: NEGATIVE

## 2017-09-02 LAB — GLUCOSE, CAPILLARY: GLUCOSE-CAPILLARY: 168 mg/dL — AB (ref 65–99)

## 2017-09-02 NOTE — Progress Notes (Addendum)
PCP is Dr Eustaquio BoydenJavier Gutierrez Dr [facility].  Rosine Abeoesn't see a cardiologist.  Reports she doesn't check her CBGs Today cbg was 168 last A1c was 5.3 on 07-30-17 BP today 77/27 last noted bp was 72/34 Spoke with Marylene LandAngela, FNP about Bp-encouraged pt daughter in law to call what ever Dr controls her meds and let the Dr know about the low BP- voices understanding. Pt is asleep in the chair, but arouses with out difficultly and answers questions. Impaired vision and hearing noted- states she has hearing aids, but doesn't wear them. Pt daughter in law states this is her normal behavior, she sleeps a lot.  Pt gave verbal consent to surgery, but can not see to sign consent signed by daughter in law.  No complaints of chest pain, cough, fever, shortness of breath, or dizziness  Echo noted 11-19-14

## 2017-09-02 NOTE — Progress Notes (Addendum)
Not here for 0800 PAT appointment called pt daughter [name] answered states "she should be there by now I will call".

## 2017-09-03 DIAGNOSIS — Z992 Dependence on renal dialysis: Secondary | ICD-10-CM | POA: Diagnosis not present

## 2017-09-03 DIAGNOSIS — D509 Iron deficiency anemia, unspecified: Secondary | ICD-10-CM | POA: Diagnosis not present

## 2017-09-03 DIAGNOSIS — E1129 Type 2 diabetes mellitus with other diabetic kidney complication: Secondary | ICD-10-CM | POA: Diagnosis not present

## 2017-09-03 DIAGNOSIS — N2581 Secondary hyperparathyroidism of renal origin: Secondary | ICD-10-CM | POA: Diagnosis not present

## 2017-09-03 DIAGNOSIS — N186 End stage renal disease: Secondary | ICD-10-CM | POA: Diagnosis not present

## 2017-09-03 NOTE — Progress Notes (Signed)
Anesthesia Chart Review:  Pt is an 84 year old female scheduled for aortogram bilateral runoff possible intervention left leg on 09/06/2017 with Leonides Sake, MD  - PCP is Eustaquio Boyden, MD - Nephrologist is Casimiro Needle, MD  PMH includes: HTN, PVD, DM, dyslipidemia, ESRD on hemodialysis, thrombocytopenia, dementia, GERD.  Never smoker.  BMI 20.  Medications include: Amlodipine, ASA 81 mg, carvedilol, Pepcid, Namenda  BP (!) 77/27   Pulse 78   Temp 36.8 C   Resp 18   Ht  (1.549 m)   Wt 106 lb (48.1 kg)   BMI 20.03 kg/m  - BP was similar at Dr. Nicky Pugh office 08/23/17: 72/34 - Pt was not symptomatic with hypotension: denied dizziness, weakness. HR normal despite hypotension. Pt instructed to contact nephrology about low BP for instructions regarding anti-HTN meds.  - I reached out to nephrology to confirm they were contacted by pt regarding hypotension; they were not contacted.  Joni Reining at [facility] consulted with pt's daughter who reported pt has not taken amlodipine in a long time, and with Ozzie Hoyle, PA who advised pt stop carvedilol.  Joni Reining will notify pt to stop carvedilol.   Preoperative labs reviewed.   - H/H 9.3/28.6. This is consistent with recent prior results - Renal function consistent with ESRD - Glucose 195. HbA1c was 5.3 on 07/30/17  EKG 09/02/17: NSR. RBBB  Echo 11/19/14:  - Left ventricle: The cavity size was normal. Wall thickness was increased in a pattern of mild LVH. Systolic function was vigorous. The estimated ejection fraction was in the range of 65% to 70%. Wall motion was normal; there were no regional wall motion abnormalities. Doppler parameters are consistent with abnormal left ventricular relaxation (grade 1 diastolic dysfunction). - Tricuspid valve: There was moderate regurgitation. - Pulmonary arteries: Systolic pressure was moderately increased. PA peak pressure: 54 mm Hg (S).  If BP acceptable day of surgery, I anticipate pt  can proceed with surgery as scheduled.   Rica Mast, FNP-BC [facility] Stay Surgical Center/Anesthesiology Phone: [phone] 09/03/2017 1:02 PM

## 2017-09-04 DIAGNOSIS — E1129 Type 2 diabetes mellitus with other diabetic kidney complication: Secondary | ICD-10-CM | POA: Diagnosis not present

## 2017-09-04 DIAGNOSIS — N2581 Secondary hyperparathyroidism of renal origin: Secondary | ICD-10-CM | POA: Diagnosis not present

## 2017-09-04 DIAGNOSIS — D509 Iron deficiency anemia, unspecified: Secondary | ICD-10-CM | POA: Diagnosis not present

## 2017-09-04 DIAGNOSIS — N186 End stage renal disease: Secondary | ICD-10-CM | POA: Diagnosis not present

## 2017-09-04 DIAGNOSIS — Z992 Dependence on renal dialysis: Secondary | ICD-10-CM | POA: Diagnosis not present

## 2017-09-06 ENCOUNTER — Ambulatory Visit (HOSPITAL_COMMUNITY): Payer: Medicare Other | Admitting: Certified Registered"

## 2017-09-06 ENCOUNTER — Telehealth: Payer: Self-pay | Admitting: Vascular Surgery

## 2017-09-06 ENCOUNTER — Ambulatory Visit (HOSPITAL_COMMUNITY): Payer: Medicare Other | Admitting: Emergency Medicine

## 2017-09-06 ENCOUNTER — Ambulatory Visit (HOSPITAL_COMMUNITY)
Admission: RE | Admit: 2017-09-06 | Discharge: 2017-09-06 | Disposition: A | Discharge status: Home / Self Care | Payer: Medicare Other | Source: Ambulatory Visit | Attending: Vascular Surgery | Admitting: Vascular Surgery

## 2017-09-06 ENCOUNTER — Encounter (HOSPITAL_COMMUNITY)
Admission: RE | Disposition: A | Discharge status: Home / Self Care | Payer: Self-pay | Source: Ambulatory Visit | Attending: Vascular Surgery

## 2017-09-06 ENCOUNTER — Encounter (HOSPITAL_COMMUNITY): Payer: Self-pay | Admitting: Surgery

## 2017-09-06 ENCOUNTER — Other Ambulatory Visit: Payer: Self-pay

## 2017-09-06 DIAGNOSIS — I120 Hypertensive chronic kidney disease with stage 5 chronic kidney disease or end stage renal disease: Secondary | ICD-10-CM | POA: Diagnosis not present

## 2017-09-06 DIAGNOSIS — M1990 Unspecified osteoarthritis, unspecified site: Secondary | ICD-10-CM | POA: Insufficient documentation

## 2017-09-06 DIAGNOSIS — I998 Other disorder of circulatory system: Secondary | ICD-10-CM | POA: Diagnosis not present

## 2017-09-06 DIAGNOSIS — K219 Gastro-esophageal reflux disease without esophagitis: Secondary | ICD-10-CM | POA: Diagnosis not present

## 2017-09-06 DIAGNOSIS — E1122 Type 2 diabetes mellitus with diabetic chronic kidney disease: Secondary | ICD-10-CM | POA: Insufficient documentation

## 2017-09-06 DIAGNOSIS — N186 End stage renal disease: Secondary | ICD-10-CM | POA: Insufficient documentation

## 2017-09-06 DIAGNOSIS — F329 Major depressive disorder, single episode, unspecified: Secondary | ICD-10-CM | POA: Diagnosis not present

## 2017-09-06 DIAGNOSIS — D631 Anemia in chronic kidney disease: Secondary | ICD-10-CM | POA: Insufficient documentation

## 2017-09-06 DIAGNOSIS — E1152 Type 2 diabetes mellitus with diabetic peripheral angiopathy with gangrene: Secondary | ICD-10-CM | POA: Diagnosis not present

## 2017-09-06 DIAGNOSIS — Z7982 Long term (current) use of aspirin: Secondary | ICD-10-CM | POA: Insufficient documentation

## 2017-09-06 DIAGNOSIS — F419 Anxiety disorder, unspecified: Secondary | ICD-10-CM | POA: Insufficient documentation

## 2017-09-06 DIAGNOSIS — F0390 Unspecified dementia without behavioral disturbance: Secondary | ICD-10-CM | POA: Insufficient documentation

## 2017-09-06 DIAGNOSIS — I70263 Atherosclerosis of native arteries of extremities with gangrene, bilateral legs: Secondary | ICD-10-CM | POA: Diagnosis not present

## 2017-09-06 DIAGNOSIS — E785 Hyperlipidemia, unspecified: Secondary | ICD-10-CM | POA: Diagnosis not present

## 2017-09-06 DIAGNOSIS — I96 Gangrene, not elsewhere classified: Secondary | ICD-10-CM | POA: Diagnosis not present

## 2017-09-06 HISTORY — PX: AORTOGRAM: SHX6300

## 2017-09-06 LAB — GLUCOSE, CAPILLARY
GLUCOSE-CAPILLARY: 120 mg/dL — AB (ref 65–99)
Glucose-Capillary: 107 mg/dL — ABNORMAL HIGH (ref 65–99)

## 2017-09-06 SURGERY — AORTOGRAM
Anesthesia: Monitor Anesthesia Care | Site: Groin | Laterality: Left

## 2017-09-06 MED ORDER — PROPOFOL 10 MG/ML IV BOLUS
INTRAVENOUS | Status: AC
  Filled 2017-09-06: qty 20

## 2017-09-06 MED ORDER — PROPOFOL 500 MG/50ML IV EMUL
INTRAVENOUS | Status: DC | PRN
  Administered 2017-09-06: 25 ug/kg/min via INTRAVENOUS

## 2017-09-06 MED ORDER — SODIUM CHLORIDE 0.9 % IV SOLN
INTRAVENOUS | Status: DC | PRN
  Administered 2017-09-06: 500 mL

## 2017-09-06 MED ORDER — SODIUM CHLORIDE 0.9 % IJ SOLN
INTRAMUSCULAR | Status: DC | PRN
  Administered 2017-09-06: 140 mL via INTRAMUSCULAR

## 2017-09-06 MED ORDER — LIDOCAINE HCL (PF) 1 % IJ SOLN
INTRAMUSCULAR | Status: AC
  Filled 2017-09-06: qty 30

## 2017-09-06 MED ORDER — FENTANYL CITRATE (PF) 100 MCG/2ML IJ SOLN: 25.0000 ug | INTRAMUSCULAR | Status: DC | PRN

## 2017-09-06 MED ORDER — LIDOCAINE HCL 1 % IJ SOLN
INTRAMUSCULAR | Status: DC | PRN
  Administered 2017-09-06: 3 mL

## 2017-09-06 MED ORDER — SODIUM CHLORIDE 0.9 % IV SOLN
INTRAVENOUS | Status: DC
  Administered 2017-09-06 (×2): via INTRAVENOUS

## 2017-09-06 MED ORDER — HEPARIN SODIUM (PORCINE) 5000 UNIT/ML IJ SOLN
INTRAMUSCULAR | Status: AC
  Filled 2017-09-06: qty 1.2

## 2017-09-06 MED ORDER — PHENYLEPHRINE HCL 10 MG/ML IJ SOLN
INTRAVENOUS | Status: DC | PRN
  Administered 2017-09-06: 25 ug/min via INTRAVENOUS

## 2017-09-06 MED ORDER — FENTANYL CITRATE (PF) 250 MCG/5ML IJ SOLN
INTRAMUSCULAR | Status: AC
  Filled 2017-09-06: qty 5

## 2017-09-06 MED ORDER — PHENYLEPHRINE 40 MCG/ML (10ML) SYRINGE FOR IV PUSH (FOR BLOOD PRESSURE SUPPORT)
PREFILLED_SYRINGE | INTRAVENOUS | Status: AC
  Filled 2017-09-06: qty 10

## 2017-09-06 SURGICAL SUPPLY — 43 items
BAG BANDED W/RUBBER/TAPE 36X54 (MISCELLANEOUS) ×4 IMPLANT
BLADE SURG 11 STRL SS (BLADE) ×4 IMPLANT
BLADE SURG 15 STRL LF DISP TIS (BLADE) ×2 IMPLANT
BLADE SURG 15 STRL SS (BLADE) ×2
CANISTER SUCT 3000ML PPV (MISCELLANEOUS) ×4 IMPLANT
CATH ANGIO 5F BER 65CM (CATHETERS) IMPLANT
CATH OMNI FLUSH .035X70CM (CATHETERS) ×4 IMPLANT
CATH OMNI FLUSH 5F 65CM (CATHETERS) ×4 IMPLANT
COVER BACK TABLE 80X110 HD (DRAPES) ×4 IMPLANT
COVER DOME SNAP 22 D (MISCELLANEOUS) ×4 IMPLANT
COVER PROBE W GEL 5X96 (DRAPES) ×8 IMPLANT
COVER SURGICAL LIGHT HANDLE (MISCELLANEOUS) ×4 IMPLANT
DEVICE TORQUE KENDALL .025-038 (MISCELLANEOUS) ×4 IMPLANT
DRAPE FEMORAL ANGIO 80X135IN (DRAPES) ×4 IMPLANT
DRSG TEGADERM 2-3/8X2-3/4 SM (GAUZE/BANDAGES/DRESSINGS) ×4 IMPLANT
ELECT REM PT RETURN 9FT ADLT (ELECTROSURGICAL)
ELECTRODE REM PT RTRN 9FT ADLT (ELECTROSURGICAL) IMPLANT
GAUZE SPONGE 2X2 8PLY STRL LF (GAUZE/BANDAGES/DRESSINGS) ×2 IMPLANT
GAUZE SPONGE 4X4 16PLY XRAY LF (GAUZE/BANDAGES/DRESSINGS) ×4 IMPLANT
GLOVE BIO SURGEON STRL SZ7 (GLOVE) ×4 IMPLANT
GUIDEWIRE ANGLED .035X150CM (WIRE) IMPLANT
KIT BASIN OR (CUSTOM PROCEDURE TRAY) ×4 IMPLANT
KIT TURNOVER KIT B (KITS) ×4 IMPLANT
NEEDLE HYPO 25GX1X1/2 BEV (NEEDLE) ×4 IMPLANT
NEEDLE PERC 18GX7CM (NEEDLE) ×4 IMPLANT
NS IRRIG 1000ML POUR BTL (IV SOLUTION) ×4 IMPLANT
PACK PERIPHERAL VASCULAR (CUSTOM PROCEDURE TRAY) IMPLANT
PAD ARMBOARD 7.5X6 YLW CONV (MISCELLANEOUS) ×8 IMPLANT
PROTECTION STATION PRESSURIZED (MISCELLANEOUS) ×4
SET MICROPUNCTURE 5F STIFF (MISCELLANEOUS) ×4 IMPLANT
SHEATH AVANTI 11CM 5FR (SHEATH) ×4 IMPLANT
SPONGE GAUZE 2X2 STER 10/PKG (GAUZE/BANDAGES/DRESSINGS) ×2
STATION PROTECTION PRESSURIZED (MISCELLANEOUS) ×2 IMPLANT
STOPCOCK MORSE 400PSI 3WAY (MISCELLANEOUS) ×4 IMPLANT
SYR 10ML LL (SYRINGE) ×20 IMPLANT
SYR 30ML LL (SYRINGE) IMPLANT
SYR 3ML LL SCALE MARK (SYRINGE) ×4 IMPLANT
SYR MEDRAD MARK V 150ML (SYRINGE) IMPLANT
SYRINGE 20CC LL (MISCELLANEOUS) ×8 IMPLANT
TOWEL GREEN STERILE (TOWEL DISPOSABLE) ×8 IMPLANT
TUBING HIGH PRESSURE 120CM (CONNECTOR) ×4 IMPLANT
WATER STERILE IRR 1000ML POUR (IV SOLUTION) IMPLANT
WIRE BENTSON .035X145CM (WIRE) ×4 IMPLANT

## 2017-09-06 NOTE — Telephone Encounter (Signed)
Pt was referred to DR Lajoyce Cornersuda

## 2017-09-06 NOTE — Op Note (Signed)
OPERATIVE NOTE   PROCEDURE: 1.  right common femoral artery cannulation under ultrasound guidance 2.  Aortogram 3.  Bilateral runoff  PRE-OPERATIVE DIAGNOSIS: dementia, bilateral foot gangrene  POST-OPERATIVE DIAGNOSIS: same as above   SURGEON: Leonides Sake, MD  ANESTHESIA: conscious sedation  ESTIMATED BLOOD LOSS: 50 cc  CONTRAST: 150 cc  FINDING(S):  Aorta: patent  Superior mesenteric artery: patent Celiac artery: not visualized   Right Left  RA Patent, no nephrogram Patent, no nephrogram  CIA Patent Patent  EIA Patent Patent  IIA Patent Patent  CFA Patent Patent  SFA Patent Patent  PFA Patent Patent  Pop Patent Patent  Trif Patent, disease 50-75% stenosis throughout Patent  AT Patent proximally, occludes in proximal segment Patent proximally, occludes in proximal segment  Pero Patent proximally, occludes in proximal segment Patent proximally, occludes in proximal segment  PT Patent proximally, occludes in proximal segment Patent proximally, occludes in proximal segment  Foot Only miniscule collaterals feed both feet Only miniscule collaterals feed both feet    SPECIMEN(S):  none  INDICATIONS:   [name] is a 84 y.o. female who presents with dementia and bilateral foot gangrene.  The patient presents for: aortogram, bilateral leg runoff, and possible interventions.  I discussed with the patient the nature of angiographic procedures, especially the limited patencies of any endovascular intervention.  The patient is aware of that the risks of an angiographic procedure include but are not limited to: bleeding, infection, access site complications, renal failure, embolization, rupture of vessel, dissection, possible need for emergent surgical intervention, possible need for surgical procedures to treat the patient's pathology, and stroke and death.  The patient is aware of the risks and agrees to proceed.  DESCRIPTION: After full informed consent was obtained  from the patient, the patient was brought back to the angiography suite.  The patient was placed supine upon the angiography table and connected to monitoring equipment.  The patient was then given conscious sedation, the amounts of which are documented in the patient's chart.  The patient was prepped and drape in the standard fashion for an angiographic procedure.  At this point, attention was turned to the right groin.   Under ultrasound guidance, the subcutaneous tissue surrounding the right common femoral artery was anesthesized with 1% lidocaine with epinephrine.  The artery was then cannulated with a micropuncture needle.  The microwire was advanced into the iliac arterial system.  The needle was exchanged for a microsheath, which was loaded into the common femoral artery over the wire.  The microwire was exchanged for a Bentson wire which was advanced into the aorta.  The microsheath was then exchanged for a 5-Fr sheath which was loaded into the common femoral artery.  The Omniflush catheter was then loaded over the wire up to the level of L1.  The catheter was connected to the power injector circuit.  After de-airring and de-clotting the circuit, a power injector aortogram was completed.  The findings are listed above.  At this point, I pulled the catheter down to the distal aorta.  A bilateral leg runoff was completed in stations.  The findings are listed above.  Due to the delay at the level of the tibial arteries, left faster than right, I elected to do a dedicated right tibial injection.  I replaced the wire into the catheter, straightening out the crook in the catheter.  Both were removed from the sheath together.  I connected the right sheath to the power injector.  A tibial injection of the right lower leg was completed.  The findings are listed above.  The sheath was aspirated.  No clots were present and the sheath was reloaded with heparinized saline.  The sheath was removed on the table and  pressure held to the right common femoral artery for 20 minutes.  A sterile bandage was applied to the right common femoral artery.  Based on the images, there is no revascularization options in this patient.  I will discuss with the family palliative options.   COMPLICATIONS: none  CONDITION: stable   Leonides SakeBrian Camdyn Laden, MD, [facility] [place] Office: [phone] Pager: [phone]  09/06/2017, 10:58 AM

## 2017-09-06 NOTE — Anesthesia Preprocedure Evaluation (Addendum)
Anesthesia Evaluation  Patient identified by MRN, date of birth, ID band Patient awake    Reviewed: NPO status , Patient's Chart, lab work & pertinent test results  Airway Mallampati: II  TM Distance: >3 FB Neck ROM: Full    Dental  (+) Edentulous Upper, Edentulous Lower   Pulmonary neg pulmonary ROS,    breath sounds clear to auscultation       Cardiovascular hypertension, + Peripheral Vascular Disease   Rhythm:Regular Rate:Normal + Systolic murmurs Recent reports of hypotension   Neuro/Psych negative neurological ROS     GI/Hepatic GERD  ,  Endo/Other  diabetes  Renal/GU ESRFRenal disease     Musculoskeletal  (+) Arthritis ,   Abdominal   Peds  Hematology  (+) anemia ,   Anesthesia Other Findings   Reproductive/Obstetrics                            Anesthesia Physical Anesthesia Plan  ASA: IV  Anesthesia Plan: MAC   Post-op Pain Management:    Induction: Intravenous  PONV Risk Score and Plan: 3 and Treatment may vary due to age or medical condition  Airway Management Planned: Natural Airway  Additional Equipment:   Intra-op Plan:   Post-operative Plan:   Informed Consent: I have reviewed the patients History and Physical, chart, labs and discussed the procedure including the risks, benefits and alternatives for the proposed anesthesia with the patient or authorized representative who has indicated his/her understanding and acceptance.     Plan Discussed with: CRNA  Anesthesia Plan Comments:         Anesthesia Quick Evaluation

## 2017-09-06 NOTE — Anesthesia Postprocedure Evaluation (Signed)
Anesthesia Post Note  Patient: [name]  Procedure(s) Performed: AORTOGRAM BILATERAL RUNOFF (Left Groin) ANGIOGRAM BILATER LOWER EXTREMITY (Bilateral Groin)     Patient location during evaluation: PACU Anesthesia Type: MAC Level of consciousness: awake and oriented Pain management: pain level controlled Vital Signs Assessment: post-procedure vital signs reviewed and stable Respiratory status: spontaneous breathing, nonlabored ventilation, respiratory function stable and patient connected to nasal cannula oxygen Cardiovascular status: stable and blood pressure returned to baseline Postop Assessment: no apparent nausea or vomiting Anesthetic complications: no    Last Vitals:  Vitals:   09/06/17 1207 09/06/17 1222  BP: (!) 112/58 122/61  Pulse: 86 86  Resp: 16 13  Temp:    SpO2: 100% 100%    Last Pain:  Vitals:   09/06/17 1225  TempSrc:   PainSc: 0-No pain                 Rozelle Caudle,JAMES TERRILL

## 2017-09-06 NOTE — Discharge Instructions (Signed)
Monitored Anesthesia Care, Care After These instructions provide you with information about caring for yourself after your procedure. Your health care provider may also give you more specific instructions. Your treatment has been planned according to current medical practices, but problems sometimes occur. Call your health care provider if you have any problems or questions after your procedure. What can I expect after the procedure? After your procedure, it is common to:  Feel sleepy for several hours.  Feel clumsy and have poor balance for several hours.  Feel forgetful about what happened after the procedure.  Have poor judgment for several hours.  Feel nauseous or vomit.  Have a sore throat if you had a breathing tube during the procedure.  Follow these instructions at home: For at least 24 hours after the procedure:   Do not: ? Participate in activities in which you could fall or become injured. ? Drive. ? Use heavy machinery. ? Drink alcohol. ? Take sleeping pills or medicines that cause drowsiness. ? Make important decisions or sign legal documents. ? Take care of children on your own.  Rest. Eating and drinking  Follow the diet that is recommended by your health care provider.  If you vomit, drink water, juice, or soup when you can drink without vomiting.  Make sure you have little or no nausea before eating solid foods. General instructions  Have a responsible adult stay with you until you are awake and alert.  Take over-the-counter and prescription medicines only as told by your health care provider.  If you smoke, do not smoke without supervision.  Keep all follow-up visits as told by your health care provider. This is important. Contact a health care provider if:  You keep feeling nauseous or you keep vomiting.  You feel light-headed.  You develop a rash.  You have a fever. Get help right away if:  You have trouble breathing. This information  is not intended to replace advice given to you by your health care provider. Make sure you discuss any questions you have with your health care provider. Document Released: 09/08/2015 Document Revised: 01/08/2016 Document Reviewed: 09/08/2015 Elsevier Interactive Patient Education  2018 Elsevier Inc.   FEMORAL SITE CARE   Refer to this sheet in the next few weeks. These instructions provide you with information about caring for yourself after your procedure. Your health care provider may also give you more specific instructions. Your treatment has been planned according to current medical practices, but problems sometimes occur. Call your health care provider if you have any problems or questions after your procedure. What can I expect after the procedure? After your procedure, it is typical to have the following:  Bruising at the site that usually fades within 1-2 weeks.  Blood collecting in the tissue (hematoma) that may be painful to the touch. It should usually decrease in size and tenderness within 1-2 weeks.  Follow these instructions at home:  Take medicines only as directed by your health care provider.  You may shower 24-48 hours after the procedure or as directed by your health care provider. Remove the bandage (dressing) and gently wash the site with plain soap and water. Pat the area dry with a clean towel. Do not rub the site, because this may cause bleeding.  Do not take baths, swim, or use a hot tub until your health care provider approves.  Check your insertion site every day for redness, swelling, or drainage.  Do not apply powder or lotion to the site.  Limit use of stairs to twice a day for the first 2-3 days or as directed by your health care provider.  Do not squat for the first 2-3 days or as directed by your health care provider.  Do not lift over 10 lb (4.5 kg) for 5 days after your procedure or as directed by your health care provider.  Ask your health care  provider when it is okay to: ? Return to work or school. ? Resume usual physical activities or sports. ? Resume sexual activity.  Do not drive home if you are discharged the same day as the procedure. Have someone else drive you.  You may drive 24 hours after the procedure unless otherwise instructed by your health care provider.  Do not operate machinery or power tools for 24 hours after the procedure or as directed by your health care provider.  If your procedure was done as an outpatient procedure, which means that you went home the same day as your procedure, a responsible adult should be with you for the first 24 hours after you arrive home.  Keep all follow-up visits as directed by your health care provider. This is important. Contact a health care provider if:  You have a fever.  You have chills.  You have increased bleeding from the site. Hold pressure on the site. Get help right away if:  You have unusual pain at the site.  You have redness, warmth, or swelling at the site.  You have drainage (other than a small amount of blood on the dressing) from the site.  The site is bleeding, and the bleeding does not stop after 30 minutes of holding steady pressure on the site.  Your leg or foot becomes pale, cool, tingly, or numb. This information is not intended to replace advice given to you by your health care provider. Make sure you discuss any questions you have with your health care provider. Document Released: 01/19/2014 Document Revised: 10/24/2015 Document Reviewed: 12/05/2013 Elsevier Interactive Patient Education  [employer].

## 2017-09-06 NOTE — Telephone Encounter (Signed)
-----   Message from Sharee PimpleMarilyn K McChesney, RN sent at 09/06/2017  2:12 PM EDT ----- Regarding: 4 weeks   ----- Message ----- From: Fransisco Hertzhen, Brian L, MD Sent: 09/06/2017  11:06 AM To: [address]V[facility] Charge Pool  Cleaster CorinJosie W Diss [id number] May 10, 1929   PROCEDURE: 1.  right common femoral artery cannulation under ultrasound guidance 2.  Aortogram 3.  Bilateral runoff  Follow-up: 4 weeks

## 2017-09-06 NOTE — Telephone Encounter (Signed)
LVM for pts appt on 5/8 mld lttr

## 2017-09-06 NOTE — Interval H&P Note (Signed)
   History and Physical Update  The patient was interviewed and re-examined.  The patient's previous History and Physical has been reviewed and is unchanged from my consult.  There is no change in the plan of care: aortogram, bilateral runoff, and possible left leg intervention.Leonides Sake.  Brian Chen, MD, FACS [facility] [place] Office: [phone] Pager: [phone]  09/06/2017, 8:20 AM

## 2017-09-06 NOTE — Transfer of Care (Signed)
Immediate Anesthesia Transfer of Care Note  Patient: [name]  Procedure(s) Performed: AORTOGRAM BILATERAL RUNOFF (Left Groin) ANGIOGRAM BILATER LOWER EXTREMITY (Bilateral Groin)  Patient Location: PACU  Anesthesia Type:MAC  Level of Consciousness: awake, alert , oriented and patient cooperative  Airway & Oxygen Therapy: Patient Spontanous Breathing and Patient connected to nasal cannula oxygen  Post-op Assessment: Report given to RN and Post -op Vital signs reviewed and stable  Post vital signs: Reviewed and stable  Last Vitals:  Vitals Value Taken Time  BP 94/45 09/06/2017 11:22 AM  Temp    Pulse 87 09/06/2017 11:29 AM  Resp 13 09/06/2017 11:29 AM  SpO2 100 % 09/06/2017 11:29 AM  Vitals shown include unvalidated device data.  Last Pain:  Vitals:   09/06/17 1125  TempSrc:   PainSc: (P) 0-No pain      Patients Stated Pain Goal: 2 (10/93/23 5573)  Complications: No apparent anesthesia complications

## 2017-09-06 NOTE — Telephone Encounter (Signed)
-----   Message from Sharee PimpleMarilyn K McChesney, RN sent at 09/06/2017 11:37 AM EDT ----- Regarding: refer patient to Dr. Lajoyce Cornersuda asap   ----- Message ----- From: Fransisco Hertzhen, Brian L, MD Sent: 09/06/2017  11:26 AM To: [address]  [name] [id number] Mar 22, 1929  Please refer patient to Dr. Lajoyce Cornersuda for bilateral foot gangrene

## 2017-09-06 NOTE — Progress Notes (Signed)
Multiple attempts to collect blood for an ISTAT 8.  Dr. Jacklynn BueMassagee notified that lab and nurse was unable to collect.  Received verbal order to cancel lab.

## 2017-09-07 ENCOUNTER — Encounter (HOSPITAL_COMMUNITY): Payer: Self-pay | Admitting: Vascular Surgery

## 2017-09-07 DIAGNOSIS — E1129 Type 2 diabetes mellitus with other diabetic kidney complication: Secondary | ICD-10-CM | POA: Diagnosis not present

## 2017-09-07 DIAGNOSIS — D509 Iron deficiency anemia, unspecified: Secondary | ICD-10-CM | POA: Diagnosis not present

## 2017-09-07 DIAGNOSIS — Z992 Dependence on renal dialysis: Secondary | ICD-10-CM | POA: Diagnosis not present

## 2017-09-07 DIAGNOSIS — N2581 Secondary hyperparathyroidism of renal origin: Secondary | ICD-10-CM | POA: Diagnosis not present

## 2017-09-07 DIAGNOSIS — N186 End stage renal disease: Secondary | ICD-10-CM | POA: Diagnosis not present

## 2017-09-08 LAB — POCT I-STAT 7, (LYTES, BLD GAS, ICA,H+H)
Acid-Base Excess: 6 mmol/L — ABNORMAL HIGH (ref 0.0–2.0)
BICARBONATE: 31.7 mmol/L — AB (ref 20.0–28.0)
CALCIUM ION: 1.04 mmol/L — AB (ref 1.15–1.40)
HCT: 30 % — ABNORMAL LOW (ref 36.0–46.0)
Hemoglobin: 10.2 g/dL — ABNORMAL LOW (ref 12.0–15.0)
O2 Saturation: 100 %
PH ART: 7.422 (ref 7.350–7.450)
Potassium: 3.1 mmol/L — ABNORMAL LOW (ref 3.5–5.1)
SODIUM: 142 mmol/L (ref 135–145)
TCO2: 33 mmol/L — ABNORMAL HIGH (ref 22–32)
pCO2 arterial: 48.5 mmHg — ABNORMAL HIGH (ref 32.0–48.0)
pO2, Arterial: 201 mmHg — ABNORMAL HIGH (ref 83.0–108.0)

## 2017-09-09 DIAGNOSIS — E1129 Type 2 diabetes mellitus with other diabetic kidney complication: Secondary | ICD-10-CM | POA: Diagnosis not present

## 2017-09-09 DIAGNOSIS — N186 End stage renal disease: Secondary | ICD-10-CM | POA: Diagnosis not present

## 2017-09-09 DIAGNOSIS — Z992 Dependence on renal dialysis: Secondary | ICD-10-CM | POA: Diagnosis not present

## 2017-09-09 DIAGNOSIS — N2581 Secondary hyperparathyroidism of renal origin: Secondary | ICD-10-CM | POA: Diagnosis not present

## 2017-09-09 DIAGNOSIS — D509 Iron deficiency anemia, unspecified: Secondary | ICD-10-CM | POA: Diagnosis not present

## 2017-09-11 DIAGNOSIS — E1129 Type 2 diabetes mellitus with other diabetic kidney complication: Secondary | ICD-10-CM | POA: Diagnosis not present

## 2017-09-11 DIAGNOSIS — Z992 Dependence on renal dialysis: Secondary | ICD-10-CM | POA: Diagnosis not present

## 2017-09-11 DIAGNOSIS — N186 End stage renal disease: Secondary | ICD-10-CM | POA: Diagnosis not present

## 2017-09-11 DIAGNOSIS — D509 Iron deficiency anemia, unspecified: Secondary | ICD-10-CM | POA: Diagnosis not present

## 2017-09-11 DIAGNOSIS — N2581 Secondary hyperparathyroidism of renal origin: Secondary | ICD-10-CM | POA: Diagnosis not present

## 2017-09-15 DIAGNOSIS — N186 End stage renal disease: Secondary | ICD-10-CM | POA: Diagnosis not present

## 2017-09-15 DIAGNOSIS — T82868A Thrombosis of vascular prosthetic devices, implants and grafts, initial encounter: Secondary | ICD-10-CM | POA: Diagnosis not present

## 2017-09-15 DIAGNOSIS — Z992 Dependence on renal dialysis: Secondary | ICD-10-CM | POA: Diagnosis not present

## 2017-09-15 DIAGNOSIS — T82858A Stenosis of vascular prosthetic devices, implants and grafts, initial encounter: Secondary | ICD-10-CM | POA: Diagnosis not present

## 2017-09-16 DIAGNOSIS — D509 Iron deficiency anemia, unspecified: Secondary | ICD-10-CM | POA: Diagnosis not present

## 2017-09-16 DIAGNOSIS — N186 End stage renal disease: Secondary | ICD-10-CM | POA: Diagnosis not present

## 2017-09-16 DIAGNOSIS — E1129 Type 2 diabetes mellitus with other diabetic kidney complication: Secondary | ICD-10-CM | POA: Diagnosis not present

## 2017-09-16 DIAGNOSIS — Z992 Dependence on renal dialysis: Secondary | ICD-10-CM | POA: Diagnosis not present

## 2017-09-16 DIAGNOSIS — N2581 Secondary hyperparathyroidism of renal origin: Secondary | ICD-10-CM | POA: Diagnosis not present

## 2017-09-18 DIAGNOSIS — Z992 Dependence on renal dialysis: Secondary | ICD-10-CM | POA: Diagnosis not present

## 2017-09-18 DIAGNOSIS — N2581 Secondary hyperparathyroidism of renal origin: Secondary | ICD-10-CM | POA: Diagnosis not present

## 2017-09-18 DIAGNOSIS — N186 End stage renal disease: Secondary | ICD-10-CM | POA: Diagnosis not present

## 2017-09-18 DIAGNOSIS — D509 Iron deficiency anemia, unspecified: Secondary | ICD-10-CM | POA: Diagnosis not present

## 2017-09-18 DIAGNOSIS — E1129 Type 2 diabetes mellitus with other diabetic kidney complication: Secondary | ICD-10-CM | POA: Diagnosis not present

## 2017-09-20 ENCOUNTER — Ambulatory Visit (INDEPENDENT_AMBULATORY_CARE_PROVIDER_SITE_OTHER): Payer: Self-pay

## 2017-09-20 ENCOUNTER — Ambulatory Visit (INDEPENDENT_AMBULATORY_CARE_PROVIDER_SITE_OTHER): Payer: Medicare Other | Admitting: Orthopedic Surgery

## 2017-09-20 ENCOUNTER — Encounter (INDEPENDENT_AMBULATORY_CARE_PROVIDER_SITE_OTHER): Payer: Self-pay | Admitting: Orthopedic Surgery

## 2017-09-20 DIAGNOSIS — I70263 Atherosclerosis of native arteries of extremities with gangrene, bilateral legs: Secondary | ICD-10-CM

## 2017-09-20 DIAGNOSIS — M79671 Pain in right foot: Secondary | ICD-10-CM

## 2017-09-20 NOTE — Progress Notes (Signed)
Office Visit Note   Patient: [name]           Date of Birth: December 02, 1928           MRN: [id number] Visit Date: 09/20/2017              Requested by: Fransisco Hertz, MD [address] [place], [place] [address] PCP: Eustaquio Boyden, MD  Chief Complaint  Patient presents with  . Right Foot - Follow-up  . Left Foot - Follow-up      HPI: Patient is a 84 year old woman with dementia end-stage renal disease on dialysis Tuesday Thursday Saturday with severe peripheral vascular disease and type 2 diabetes.  Patient is status post arteriogram studies with Dr. Imogene Burn.  Patient does not have circulation distal to the popliteal vessels and does not have revascularization options.  Assessment & Plan: Visit Diagnoses:  1. Pain in right foot   2. 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  Patient does have an open popliteal vessel bilaterally.  She should heal a transtibial amputation.  Patient's daughter states that she would be making decisions for surgery and that she will call us when she discussed this with her family.  Patient will be discharged to skilled nursing she would be total assistance for transfers.    Follow-Up Instructions: Return if symptoms worsen or fail to improve.   Ortho Exam  Patient is alert, oriented, no adenopathy, well-dressed, normal affect, normal respiratory effort. Examination patient does respond to questions.  She is not oriented to place or time.  Patient is ambulating in a wheelchair.  She complains of ischemic pain in both lower extremities.  She has painful dry gangrene of the left foot third toe and painful gangrene of the right foot second toe.  Patient does not have palpable pulses there is no ascending cellulitis she has dry gangrene with no drainage.  Her vascular studies were reviewed which  shows she does have an open popliteal vessel bilaterally.  Her radiographs were reviewed which showed severe osteoporosis with calcification of the vessels out to her toes.  Imaging: No results found. No images are attached to the encounter.  Labs: Lab Results  Component Value Date   HGBA1C 5.3 07/30/2017   HGBA1C 5.9 10/09/2016   HGBA1C 5.2 07/19/2015   ESRSEDRATE 45 (H) 11/19/2014   CRP 19.5 (H) 11/19/2014   REPTSTATUS 05/19/2015 FINAL 05/14/2015   GRAMSTAIN  04/10/2015    MODERATE WBC PRESENT,BOTH PMN AND MONONUCLEAR NO SQUAMOUS EPITHELIAL CELLS SEEN NO ORGANISMS SEEN Performed at [facility]    GRAMSTAIN  04/10/2015    MODERATE WBC PRESENT,BOTH PMN AND MONONUCLEAR NO SQUAMOUS EPITHELIAL CELLS SEEN NO ORGANISMS SEEN Performed at [facility]    CULT NO GROWTH 5 DAYS 05/14/2015   LABORGA NO GROWTH 11/27/2015    @LABSALLVALUES (HGBA1)@  There is no height or weight on file to calculate BMI.  Orders:  Orders Placed This Encounter  Procedures  . XR Foot Complete Right   No orders of the defined types were placed in this encounter.    Procedures: No procedures performed  Clinical Data: No additional findings.  ROS:  All other systems negative, except as noted in the HPI. Review of Systems  Objective: Vital Signs: There were no vitals taken for this visit.  Specialty Comments:  No specialty comments available.  PMFS History: Patient Active Problem List   Diagnosis Date Noted  . Atherosclerosis of native arteries of the extremities with ulceration (HCC) 08/23/2017  . PAD (peripheral artery disease) (HCC) 08/11/2017  . Ischemic ulcer of toe of left foot (HCC) 08/09/2017  . Transient alteration of awareness 12/31/2015  . Diarrhea 07/24/2015  . Medicare annual wellness visit, subsequent 07/24/2015  . Advanced care planning/counseling discussion 07/24/2015  . Hearing loss 07/24/2015  . Recurrent UTI 04/09/2015  . Rash and nonspecific  skin eruption 01/18/2015  . Depression   . Pressure ulcer 11/20/2014  . Thrombocytopenia (HCC) 11/16/2014  . ANEMIA, IRON DEFICIENCY, CHRONIC 07/01/2010  . UNSPECIFIED VISUAL LOSS 04/16/2010  . HYPOTENSION, ORTHOSTATIC 11/13/2009  . Dementia 03/08/2009  . HLD (hyperlipidemia) 01/11/2009  . ESRD on hemodialysis (HCC) 06/08/2007  . Osteoarthrosis, unspecified whether generalized or localized, unspecified site 02/25/2007  . Controlled type 2 diabetes mellitus with diabetic nephropathy, without long-term current use of insulin (HCC) 01/12/2007  . Essential hypertension 01/12/2007  . GERD 01/12/2007   Past Medical History:  Diagnosis Date  . Anxiety   . Bronchitis   . C. difficile diarrhea 02/01/2015  . Cough   . Dementia   . Depression    lexapro  . Diabetes mellitus    type 2  . Dyslipidemia    remote hx/notes 10/06/2009  . ESRD (end stage renal disease) on dialysis [facility])    Dr Coladonato/[place]  . GERD (gastroesophageal reflux disease)   . Hypertension   . 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 Osteoarthritis   . Peripheral vascular disease (HCC)   . Staphylococcus aureus bacteremia with sepsis (HCC)    thought from HD catheter  . Thrombocytopenia (HCC) 11/16/2014  . Traumatic hematoma of left forearm 05/25/2015  . UTI (urinary tract infection) 05/2015    Family History  Problem Relation Age of Onset  . Hypertension Mother     Past Surgical History:  Procedure Laterality Date  . ABDOMINAL HYSTERECTOMY     remote hx/notes 10/06/2009  . AORTOGRAM Left 09/06/2017   Procedure: AORTOGRAM BILATERAL RUNOFF;  Surgeon: Fransisco Hertz, MD;  Location: [facility] OR;  Service: Vascular;  Laterality: Left;  . ARTERIOVENOUS GRAFT PLACEMENT Left 07/2004   forearm/notes 10/13/2010  . ARTERIOVENOUS GRAFT PLACEMENT Left 03/2006   upper armnotes 10/13/2010  . AVGG REMOVAL Right 04/10/2015   Procedure: REMOVAL OF RIGHT ARM  ARTERIOVENOUS GORETEX GRAFT (AVGG);  Surgeon: Fransisco Hertz, MD;   Location: [facility] OR;  Service: Vascular;  Laterality: Right;  . EYE SURGERY     Cataract extraction  . INSERTION OF DIALYSIS CATHETER Left 04/10/2015   Procedure: INSERTION OF DIALYSIS CATHETER LEFT GROIN;  Surgeon: Fransisco Hertz, MD;  Location: [facility] OR;  Service: Vascular;  Laterality: Left;  . PERIPHERAL VASCULAR CATHETERIZATION Right 02/18/2015   Procedure: A/V Shuntogram/Fistulagram;  Surgeon: Annice Needy, MD;  Location: [facility] INVASIVE CV LAB;  Service: Cardiovascular;  Laterality: Right;  . PERIPHERAL VASCULAR CATHETERIZATION N/A 02/18/2015   Procedure: A/V Shunt Intervention;  Surgeon: Annice Needy, MD;  Location: [facility] INVASIVE CV LAB;  Service: Cardiovascular;  Laterality: N/A;  . PERIPHERAL VASCULAR CATHETERIZATION N/A 03/06/2015   Procedure: Dialysis/Perma Catheter Insertion;  Surgeon: Annice Needy, MD;  Location: [facility] INVASIVE CV LAB;  Service: Cardiovascular;  Laterality: N/A;  . REMOVAL OF A DIALYSIS CATHETER Right 04/10/2015   Procedure: REMOVAL OF RIGHT GROIN DIALYSIS CATHETER;  Surgeon: Fransisco Hertz, MD;  Location: [facility] OR;  Service: Vascular;  Laterality: Right;  .  REVISION OF ARTERIOVENOUS GORETEX GRAFT Right 03/08/2015   Procedure: REVISION OF ARTERIOVENOUS GORETEX GRAFT;  Surgeon: Renford DillsGregory G Schnier, MD;  Location: [facility] ORS;  Service: Vascular;  Laterality: Right;  . THROMBECTOMY AND REVISION OF ARTERIOVENTOUS (AV) GORETEX  GRAFT Left 12/2004; 01/2006; 03/12/2009   forearmnotes 10/13/2010; forearmnotes 10/13/2010; upper arm/notes 03/19/2009  . VITRECTOMY Left 03/2001   with membrane peel/notes 10/13/2010   Social History   Occupational History  . Not on file  Tobacco Use  . Smoking status: Never Smoker  . Smokeless tobacco: Never Used  Substance and Sexual Activity  . Alcohol use: No    Alcohol/week: 0.0 oz  . Drug use: No  . Sexual activity: Never

## 2017-09-21 DIAGNOSIS — N186 End stage renal disease: Secondary | ICD-10-CM | POA: Diagnosis not present

## 2017-09-21 DIAGNOSIS — Z992 Dependence on renal dialysis: Secondary | ICD-10-CM | POA: Diagnosis not present

## 2017-09-21 DIAGNOSIS — D509 Iron deficiency anemia, unspecified: Secondary | ICD-10-CM | POA: Diagnosis not present

## 2017-09-21 DIAGNOSIS — E1129 Type 2 diabetes mellitus with other diabetic kidney complication: Secondary | ICD-10-CM | POA: Diagnosis not present

## 2017-09-21 DIAGNOSIS — N2581 Secondary hyperparathyroidism of renal origin: Secondary | ICD-10-CM | POA: Diagnosis not present

## 2017-09-23 DIAGNOSIS — E1129 Type 2 diabetes mellitus with other diabetic kidney complication: Secondary | ICD-10-CM | POA: Diagnosis not present

## 2017-09-23 DIAGNOSIS — D509 Iron deficiency anemia, unspecified: Secondary | ICD-10-CM | POA: Diagnosis not present

## 2017-09-23 DIAGNOSIS — N2581 Secondary hyperparathyroidism of renal origin: Secondary | ICD-10-CM | POA: Diagnosis not present

## 2017-09-23 DIAGNOSIS — N186 End stage renal disease: Secondary | ICD-10-CM | POA: Diagnosis not present

## 2017-09-23 DIAGNOSIS — Z992 Dependence on renal dialysis: Secondary | ICD-10-CM | POA: Diagnosis not present

## 2017-09-24 ENCOUNTER — Encounter: Payer: Medicare Other | Admitting: Vascular Surgery

## 2017-09-24 DIAGNOSIS — I739 Peripheral vascular disease, unspecified: Secondary | ICD-10-CM | POA: Diagnosis not present

## 2017-09-24 DIAGNOSIS — G619 Inflammatory polyneuropathy, unspecified: Secondary | ICD-10-CM | POA: Diagnosis not present

## 2017-09-24 DIAGNOSIS — E1159 Type 2 diabetes mellitus with other circulatory complications: Secondary | ICD-10-CM | POA: Diagnosis not present

## 2017-09-24 DIAGNOSIS — I10 Essential (primary) hypertension: Secondary | ICD-10-CM | POA: Diagnosis not present

## 2017-09-24 DIAGNOSIS — I2510 Atherosclerotic heart disease of native coronary artery without angina pectoris: Secondary | ICD-10-CM | POA: Diagnosis not present

## 2017-09-24 DIAGNOSIS — E785 Hyperlipidemia, unspecified: Secondary | ICD-10-CM | POA: Diagnosis not present

## 2017-09-24 DIAGNOSIS — H409 Unspecified glaucoma: Secondary | ICD-10-CM | POA: Diagnosis not present

## 2017-09-24 DIAGNOSIS — F339 Major depressive disorder, recurrent, unspecified: Secondary | ICD-10-CM | POA: Diagnosis not present

## 2017-09-24 DIAGNOSIS — N186 End stage renal disease: Secondary | ICD-10-CM | POA: Diagnosis not present

## 2017-09-24 DIAGNOSIS — E46 Unspecified protein-calorie malnutrition: Secondary | ICD-10-CM | POA: Diagnosis not present

## 2017-09-24 DIAGNOSIS — I96 Gangrene, not elsewhere classified: Secondary | ICD-10-CM | POA: Diagnosis not present

## 2017-09-24 DIAGNOSIS — F0280 Dementia in other diseases classified elsewhere without behavioral disturbance: Secondary | ICD-10-CM | POA: Diagnosis not present

## 2017-09-24 DIAGNOSIS — K219 Gastro-esophageal reflux disease without esophagitis: Secondary | ICD-10-CM | POA: Diagnosis not present

## 2017-09-25 DIAGNOSIS — E1159 Type 2 diabetes mellitus with other circulatory complications: Secondary | ICD-10-CM | POA: Diagnosis not present

## 2017-09-25 DIAGNOSIS — I739 Peripheral vascular disease, unspecified: Secondary | ICD-10-CM | POA: Diagnosis not present

## 2017-09-25 DIAGNOSIS — I96 Gangrene, not elsewhere classified: Secondary | ICD-10-CM | POA: Diagnosis not present

## 2017-09-25 DIAGNOSIS — E46 Unspecified protein-calorie malnutrition: Secondary | ICD-10-CM | POA: Diagnosis not present

## 2017-09-25 DIAGNOSIS — F0280 Dementia in other diseases classified elsewhere without behavioral disturbance: Secondary | ICD-10-CM | POA: Diagnosis not present

## 2017-09-25 DIAGNOSIS — N186 End stage renal disease: Secondary | ICD-10-CM | POA: Diagnosis not present

## 2017-09-27 DIAGNOSIS — E46 Unspecified protein-calorie malnutrition: Secondary | ICD-10-CM | POA: Diagnosis not present

## 2017-09-27 DIAGNOSIS — F0280 Dementia in other diseases classified elsewhere without behavioral disturbance: Secondary | ICD-10-CM | POA: Diagnosis not present

## 2017-09-27 DIAGNOSIS — I96 Gangrene, not elsewhere classified: Secondary | ICD-10-CM | POA: Diagnosis not present

## 2017-09-27 DIAGNOSIS — E1159 Type 2 diabetes mellitus with other circulatory complications: Secondary | ICD-10-CM | POA: Diagnosis not present

## 2017-09-27 DIAGNOSIS — N186 End stage renal disease: Secondary | ICD-10-CM | POA: Diagnosis not present

## 2017-09-27 DIAGNOSIS — I739 Peripheral vascular disease, unspecified: Secondary | ICD-10-CM | POA: Diagnosis not present

## 2017-09-28 ENCOUNTER — Encounter: Payer: Self-pay | Admitting: Family Medicine

## 2017-09-28 DIAGNOSIS — E1159 Type 2 diabetes mellitus with other circulatory complications: Secondary | ICD-10-CM | POA: Diagnosis not present

## 2017-09-28 DIAGNOSIS — I739 Peripheral vascular disease, unspecified: Secondary | ICD-10-CM | POA: Diagnosis not present

## 2017-09-28 DIAGNOSIS — I96 Gangrene, not elsewhere classified: Secondary | ICD-10-CM | POA: Diagnosis not present

## 2017-09-28 DIAGNOSIS — N186 End stage renal disease: Secondary | ICD-10-CM | POA: Diagnosis not present

## 2017-09-28 DIAGNOSIS — F0280 Dementia in other diseases classified elsewhere without behavioral disturbance: Secondary | ICD-10-CM | POA: Diagnosis not present

## 2017-09-28 DIAGNOSIS — E46 Unspecified protein-calorie malnutrition: Secondary | ICD-10-CM | POA: Diagnosis not present

## 2017-09-28 DIAGNOSIS — Z515 Encounter for palliative care: Secondary | ICD-10-CM | POA: Insufficient documentation

## 2017-09-29 DIAGNOSIS — E1159 Type 2 diabetes mellitus with other circulatory complications: Secondary | ICD-10-CM | POA: Diagnosis not present

## 2017-09-29 DIAGNOSIS — H409 Unspecified glaucoma: Secondary | ICD-10-CM | POA: Diagnosis not present

## 2017-09-29 DIAGNOSIS — I10 Essential (primary) hypertension: Secondary | ICD-10-CM | POA: Diagnosis not present

## 2017-09-29 DIAGNOSIS — F339 Major depressive disorder, recurrent, unspecified: Secondary | ICD-10-CM | POA: Diagnosis not present

## 2017-09-29 DIAGNOSIS — I96 Gangrene, not elsewhere classified: Secondary | ICD-10-CM | POA: Diagnosis not present

## 2017-09-29 DIAGNOSIS — E46 Unspecified protein-calorie malnutrition: Secondary | ICD-10-CM | POA: Diagnosis not present

## 2017-09-29 DIAGNOSIS — I2510 Atherosclerotic heart disease of native coronary artery without angina pectoris: Secondary | ICD-10-CM | POA: Diagnosis not present

## 2017-09-29 DIAGNOSIS — I739 Peripheral vascular disease, unspecified: Secondary | ICD-10-CM | POA: Diagnosis not present

## 2017-09-29 DIAGNOSIS — K219 Gastro-esophageal reflux disease without esophagitis: Secondary | ICD-10-CM | POA: Diagnosis not present

## 2017-09-29 DIAGNOSIS — G619 Inflammatory polyneuropathy, unspecified: Secondary | ICD-10-CM | POA: Diagnosis not present

## 2017-09-29 DIAGNOSIS — F0280 Dementia in other diseases classified elsewhere without behavioral disturbance: Secondary | ICD-10-CM | POA: Diagnosis not present

## 2017-09-29 DIAGNOSIS — N186 End stage renal disease: Secondary | ICD-10-CM | POA: Diagnosis not present

## 2017-09-29 DIAGNOSIS — E785 Hyperlipidemia, unspecified: Secondary | ICD-10-CM | POA: Diagnosis not present

## 2017-09-30 DIAGNOSIS — E1159 Type 2 diabetes mellitus with other circulatory complications: Secondary | ICD-10-CM | POA: Diagnosis not present

## 2017-09-30 DIAGNOSIS — I96 Gangrene, not elsewhere classified: Secondary | ICD-10-CM | POA: Diagnosis not present

## 2017-09-30 DIAGNOSIS — F0280 Dementia in other diseases classified elsewhere without behavioral disturbance: Secondary | ICD-10-CM | POA: Diagnosis not present

## 2017-09-30 DIAGNOSIS — E46 Unspecified protein-calorie malnutrition: Secondary | ICD-10-CM | POA: Diagnosis not present

## 2017-09-30 DIAGNOSIS — N186 End stage renal disease: Secondary | ICD-10-CM | POA: Diagnosis not present

## 2017-09-30 DIAGNOSIS — I739 Peripheral vascular disease, unspecified: Secondary | ICD-10-CM | POA: Diagnosis not present

## 2017-10-01 DIAGNOSIS — N186 End stage renal disease: Secondary | ICD-10-CM | POA: Diagnosis not present

## 2017-10-01 DIAGNOSIS — E1159 Type 2 diabetes mellitus with other circulatory complications: Secondary | ICD-10-CM | POA: Diagnosis not present

## 2017-10-01 DIAGNOSIS — F0280 Dementia in other diseases classified elsewhere without behavioral disturbance: Secondary | ICD-10-CM | POA: Diagnosis not present

## 2017-10-01 DIAGNOSIS — E46 Unspecified protein-calorie malnutrition: Secondary | ICD-10-CM | POA: Diagnosis not present

## 2017-10-01 DIAGNOSIS — I96 Gangrene, not elsewhere classified: Secondary | ICD-10-CM | POA: Diagnosis not present

## 2017-10-01 DIAGNOSIS — I739 Peripheral vascular disease, unspecified: Secondary | ICD-10-CM | POA: Diagnosis not present

## 2017-10-05 DIAGNOSIS — F0280 Dementia in other diseases classified elsewhere without behavioral disturbance: Secondary | ICD-10-CM | POA: Diagnosis not present

## 2017-10-05 DIAGNOSIS — E1159 Type 2 diabetes mellitus with other circulatory complications: Secondary | ICD-10-CM | POA: Diagnosis not present

## 2017-10-05 DIAGNOSIS — I96 Gangrene, not elsewhere classified: Secondary | ICD-10-CM | POA: Diagnosis not present

## 2017-10-05 DIAGNOSIS — N186 End stage renal disease: Secondary | ICD-10-CM | POA: Diagnosis not present

## 2017-10-05 DIAGNOSIS — E46 Unspecified protein-calorie malnutrition: Secondary | ICD-10-CM | POA: Diagnosis not present

## 2017-10-05 DIAGNOSIS — I739 Peripheral vascular disease, unspecified: Secondary | ICD-10-CM | POA: Diagnosis not present

## 2017-10-06 ENCOUNTER — Encounter: Payer: Medicare Other | Admitting: Vascular Surgery

## 2017-10-07 DIAGNOSIS — N186 End stage renal disease: Secondary | ICD-10-CM | POA: Diagnosis not present

## 2017-10-07 DIAGNOSIS — I739 Peripheral vascular disease, unspecified: Secondary | ICD-10-CM | POA: Diagnosis not present

## 2017-10-07 DIAGNOSIS — E1159 Type 2 diabetes mellitus with other circulatory complications: Secondary | ICD-10-CM | POA: Diagnosis not present

## 2017-10-07 DIAGNOSIS — I96 Gangrene, not elsewhere classified: Secondary | ICD-10-CM | POA: Diagnosis not present

## 2017-10-07 DIAGNOSIS — E46 Unspecified protein-calorie malnutrition: Secondary | ICD-10-CM | POA: Diagnosis not present

## 2017-10-07 DIAGNOSIS — F0280 Dementia in other diseases classified elsewhere without behavioral disturbance: Secondary | ICD-10-CM | POA: Diagnosis not present

## 2017-10-08 DIAGNOSIS — N186 End stage renal disease: Secondary | ICD-10-CM | POA: Diagnosis not present

## 2017-10-08 DIAGNOSIS — I739 Peripheral vascular disease, unspecified: Secondary | ICD-10-CM | POA: Diagnosis not present

## 2017-10-08 DIAGNOSIS — I96 Gangrene, not elsewhere classified: Secondary | ICD-10-CM | POA: Diagnosis not present

## 2017-10-08 DIAGNOSIS — E46 Unspecified protein-calorie malnutrition: Secondary | ICD-10-CM | POA: Diagnosis not present

## 2017-10-08 DIAGNOSIS — F0280 Dementia in other diseases classified elsewhere without behavioral disturbance: Secondary | ICD-10-CM | POA: Diagnosis not present

## 2017-10-08 DIAGNOSIS — E1159 Type 2 diabetes mellitus with other circulatory complications: Secondary | ICD-10-CM | POA: Diagnosis not present

## 2017-10-11 DIAGNOSIS — N186 End stage renal disease: Secondary | ICD-10-CM | POA: Diagnosis not present

## 2017-10-11 DIAGNOSIS — I739 Peripheral vascular disease, unspecified: Secondary | ICD-10-CM | POA: Diagnosis not present

## 2017-10-11 DIAGNOSIS — I96 Gangrene, not elsewhere classified: Secondary | ICD-10-CM | POA: Diagnosis not present

## 2017-10-11 DIAGNOSIS — E46 Unspecified protein-calorie malnutrition: Secondary | ICD-10-CM | POA: Diagnosis not present

## 2017-10-11 DIAGNOSIS — F0280 Dementia in other diseases classified elsewhere without behavioral disturbance: Secondary | ICD-10-CM | POA: Diagnosis not present

## 2017-10-11 DIAGNOSIS — E1159 Type 2 diabetes mellitus with other circulatory complications: Secondary | ICD-10-CM | POA: Diagnosis not present

## 2017-10-12 DIAGNOSIS — F0280 Dementia in other diseases classified elsewhere without behavioral disturbance: Secondary | ICD-10-CM | POA: Diagnosis not present

## 2017-10-12 DIAGNOSIS — E46 Unspecified protein-calorie malnutrition: Secondary | ICD-10-CM | POA: Diagnosis not present

## 2017-10-12 DIAGNOSIS — N186 End stage renal disease: Secondary | ICD-10-CM | POA: Diagnosis not present

## 2017-10-12 DIAGNOSIS — E1159 Type 2 diabetes mellitus with other circulatory complications: Secondary | ICD-10-CM | POA: Diagnosis not present

## 2017-10-12 DIAGNOSIS — I739 Peripheral vascular disease, unspecified: Secondary | ICD-10-CM | POA: Diagnosis not present

## 2017-10-12 DIAGNOSIS — I96 Gangrene, not elsewhere classified: Secondary | ICD-10-CM | POA: Diagnosis not present

## 2017-10-14 DIAGNOSIS — N186 End stage renal disease: Secondary | ICD-10-CM | POA: Diagnosis not present

## 2017-10-14 DIAGNOSIS — I739 Peripheral vascular disease, unspecified: Secondary | ICD-10-CM | POA: Diagnosis not present

## 2017-10-14 DIAGNOSIS — E1159 Type 2 diabetes mellitus with other circulatory complications: Secondary | ICD-10-CM | POA: Diagnosis not present

## 2017-10-14 DIAGNOSIS — F0280 Dementia in other diseases classified elsewhere without behavioral disturbance: Secondary | ICD-10-CM | POA: Diagnosis not present

## 2017-10-14 DIAGNOSIS — E46 Unspecified protein-calorie malnutrition: Secondary | ICD-10-CM | POA: Diagnosis not present

## 2017-10-14 DIAGNOSIS — I96 Gangrene, not elsewhere classified: Secondary | ICD-10-CM | POA: Diagnosis not present

## 2017-10-15 DIAGNOSIS — F0280 Dementia in other diseases classified elsewhere without behavioral disturbance: Secondary | ICD-10-CM | POA: Diagnosis not present

## 2017-10-15 DIAGNOSIS — E46 Unspecified protein-calorie malnutrition: Secondary | ICD-10-CM | POA: Diagnosis not present

## 2017-10-15 DIAGNOSIS — I96 Gangrene, not elsewhere classified: Secondary | ICD-10-CM | POA: Diagnosis not present

## 2017-10-15 DIAGNOSIS — E1159 Type 2 diabetes mellitus with other circulatory complications: Secondary | ICD-10-CM | POA: Diagnosis not present

## 2017-10-15 DIAGNOSIS — I739 Peripheral vascular disease, unspecified: Secondary | ICD-10-CM | POA: Diagnosis not present

## 2017-10-15 DIAGNOSIS — N186 End stage renal disease: Secondary | ICD-10-CM | POA: Diagnosis not present

## 2017-10-17 DIAGNOSIS — E46 Unspecified protein-calorie malnutrition: Secondary | ICD-10-CM | POA: Diagnosis not present

## 2017-10-17 DIAGNOSIS — E1159 Type 2 diabetes mellitus with other circulatory complications: Secondary | ICD-10-CM | POA: Diagnosis not present

## 2017-10-17 DIAGNOSIS — I739 Peripheral vascular disease, unspecified: Secondary | ICD-10-CM | POA: Diagnosis not present

## 2017-10-17 DIAGNOSIS — I96 Gangrene, not elsewhere classified: Secondary | ICD-10-CM | POA: Diagnosis not present

## 2017-10-17 DIAGNOSIS — N186 End stage renal disease: Secondary | ICD-10-CM | POA: Diagnosis not present

## 2017-10-17 DIAGNOSIS — F0280 Dementia in other diseases classified elsewhere without behavioral disturbance: Secondary | ICD-10-CM | POA: Diagnosis not present

## 2017-10-30 DEATH — deceased

## 2017-11-05 ENCOUNTER — Telehealth: Payer: Self-pay

## 2017-11-05 NOTE — Telephone Encounter (Signed)
Copied from CRM [id number]. Topic: General - Deceased Patient >> Nov 05, 2017 10:32 AM Jay SchlichterWeikart, Melissa J wrote: Reason for CRM: pt passed July 30, 2017   Route to department's PEC Pool.

## 2017-11-05 NOTE — Telephone Encounter (Signed)
Placed letter at front office.  

## 2017-11-05 NOTE — Telephone Encounter (Signed)
Copied from CRM [phone]. Topic: Inquiry >> Nov 05, 2017 10:28 AM Jay SchlichterWeikart, Melissa J wrote: Reason for CRM: daughter gwendolyn called - she needs a note stating that she was giving her mom ( pt)  24/7 care and that is extremely stressful. That was a part of her extended sick leave.  Cb is [phone]

## 2017-11-05 NOTE — Telephone Encounter (Signed)
I was not aware patient had passed. Called daughter, expressed my condolences.  Daughter requests letter from us stating she was her mother's caregiver. She will pick up on Monday. Written and placed in Lisa's box.  Daughter was out 4/25-5/19 [facility]ressed to [facility]  Carrie, plz change patient status. Thank you.

## 2018-08-05 ENCOUNTER — Encounter: Payer: Medicare Other | Admitting: Family Medicine

## 2018-08-05 ENCOUNTER — Ambulatory Visit: Payer: Medicare Other
